# Patient Record
Sex: Male | Born: 1950 | Race: White | Hispanic: No | Marital: Married | Smoking: Former smoker
Health system: Southern US, Community
[De-identification: ages and names within clinical notes are randomized; demographics above are authoritative.]

## PROBLEM LIST (undated history)

## (undated) DIAGNOSIS — G629 Polyneuropathy, unspecified: Secondary | ICD-10-CM

## (undated) DIAGNOSIS — Z992 Dependence on renal dialysis: Secondary | ICD-10-CM

## (undated) DIAGNOSIS — K219 Gastro-esophageal reflux disease without esophagitis: Secondary | ICD-10-CM

## (undated) DIAGNOSIS — E7800 Pure hypercholesterolemia, unspecified: Secondary | ICD-10-CM

## (undated) DIAGNOSIS — I10 Essential (primary) hypertension: Secondary | ICD-10-CM

## (undated) DIAGNOSIS — G8929 Other chronic pain: Secondary | ICD-10-CM

## (undated) DIAGNOSIS — N186 End stage renal disease: Secondary | ICD-10-CM

## (undated) DIAGNOSIS — G4730 Sleep apnea, unspecified: Secondary | ICD-10-CM

## (undated) DIAGNOSIS — R091 Pleurisy: Secondary | ICD-10-CM

## (undated) DIAGNOSIS — M1990 Unspecified osteoarthritis, unspecified site: Secondary | ICD-10-CM

## (undated) DIAGNOSIS — J189 Pneumonia, unspecified organism: Secondary | ICD-10-CM

## (undated) DIAGNOSIS — M25551 Pain in right hip: Secondary | ICD-10-CM

## (undated) DIAGNOSIS — H269 Unspecified cataract: Secondary | ICD-10-CM

## (undated) DIAGNOSIS — D649 Anemia, unspecified: Secondary | ICD-10-CM

## (undated) DIAGNOSIS — M109 Gout, unspecified: Secondary | ICD-10-CM

## (undated) HISTORY — PX: TOTAL KNEE ARTHROPLASTY: SHX125

## (undated) HISTORY — PX: WRIST TENDON TRANSFER: SHX2676

## (undated) HISTORY — PX: COLONOSCOPY: SHX174

## (undated) HISTORY — PX: JOINT REPLACEMENT: SHX530

## (undated) HISTORY — PX: BILATERAL CARPAL TUNNEL RELEASE: SHX6508

## (undated) HISTORY — DX: Unspecified cataract: H26.9

---
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
## 1977-02-16 HISTORY — PX: CHOLECYSTECTOMY OPEN: SUR202

## 1979-02-17 HISTORY — PX: HAMMER TOE SURGERY: SHX385

## 1999-02-17 HISTORY — PX: SHOULDER OPEN ROTATOR CUFF REPAIR: SHX2407

## 2004-02-17 HISTORY — PX: FINGER SURGERY: SHX640

## 2005-06-17 ENCOUNTER — Encounter: Admission: RE | Admit: 2005-06-17 | Discharge: 2005-06-17 | Payer: Self-pay | Admitting: Nephrology

## 2006-03-01 ENCOUNTER — Inpatient Hospital Stay (HOSPITAL_COMMUNITY): Admission: RE | Admit: 2006-03-01 | Discharge: 2006-03-05 | Payer: Self-pay | Admitting: Orthopedic Surgery

## 2006-03-04 ENCOUNTER — Encounter: Payer: Self-pay | Admitting: Vascular Surgery

## 2006-03-16 ENCOUNTER — Encounter (HOSPITAL_COMMUNITY): Admission: RE | Admit: 2006-03-16 | Discharge: 2006-06-14 | Payer: Self-pay | Admitting: Nephrology

## 2006-11-12 ENCOUNTER — Ambulatory Visit (HOSPITAL_COMMUNITY): Admission: RE | Admit: 2006-11-12 | Discharge: 2006-11-12 | Payer: Self-pay | Admitting: Nephrology

## 2008-03-05 ENCOUNTER — Inpatient Hospital Stay (HOSPITAL_COMMUNITY): Admission: RE | Admit: 2008-03-05 | Discharge: 2008-03-08 | Payer: Self-pay | Admitting: Orthopedic Surgery

## 2009-10-11 ENCOUNTER — Encounter (HOSPITAL_COMMUNITY): Admission: RE | Admit: 2009-10-11 | Discharge: 2009-12-17 | Payer: Self-pay | Admitting: Nephrology

## 2010-06-02 LAB — CBC
HCT: 28.5 % — ABNORMAL LOW (ref 39.0–52.0)
HCT: 30.4 % — ABNORMAL LOW (ref 39.0–52.0)
HCT: 34.9 % — ABNORMAL LOW (ref 39.0–52.0)
Hemoglobin: 13.2 g/dL (ref 13.0–17.0)
Hemoglobin: 10.1 g/dL — ABNORMAL LOW (ref 13.0–17.0)
Hemoglobin: 11.4 g/dL — ABNORMAL LOW (ref 13.0–17.0)
Hemoglobin: 9.5 g/dL — ABNORMAL LOW (ref 13.0–17.0)
MCHC: 33.3 g/dL (ref 30.0–36.0)
MCV: 94.7 fL (ref 78.0–100.0)
Platelets: 242 10*3/uL (ref 150–400)
RBC: 4.26 MIL/uL (ref 4.22–5.81)
RBC: 3.02 MIL/uL — ABNORMAL LOW (ref 4.22–5.81)

## 2010-06-02 LAB — PROTIME-INR
INR: 1.1 (ref 0.00–1.49)
INR: 1.2 (ref 0.00–1.49)
INR: 1.3 (ref 0.00–1.49)
Prothrombin Time: 14.2 seconds (ref 11.6–15.2)

## 2010-06-02 LAB — BASIC METABOLIC PANEL
BUN: 50 mg/dL — ABNORMAL HIGH (ref 6–23)
CO2: 25 mEq/L (ref 19–32)
CO2: 25 mEq/L (ref 19–32)
CO2: 26 mEq/L (ref 19–32)
Calcium: 9.4 mg/dL (ref 8.4–10.5)
Calcium: 8.1 mg/dL — ABNORMAL LOW (ref 8.4–10.5)
Chloride: 99 mEq/L (ref 96–112)
Creatinine, Ser: 2.88 mg/dL — ABNORMAL HIGH (ref 0.4–1.5)
GFR calc Af Amer: 27 mL/min — ABNORMAL LOW (ref >60)
GFR calc non Af Amer: 23 mL/min — ABNORMAL LOW (ref >60)
GFR calc non Af Amer: 19 mL/min — ABNORMAL LOW (ref >60)
Glucose, Bld: 108 mg/dL — ABNORMAL HIGH (ref 70–99)
Glucose, Bld: 113 mg/dL — ABNORMAL HIGH (ref 70–99)
Glucose, Bld: 147 mg/dL — ABNORMAL HIGH (ref 70–99)
Potassium: 4.6 mEq/L (ref 3.5–5.1)
Potassium: 4.9 mEq/L (ref 3.5–5.1)
Sodium: 135 mEq/L (ref 135–145)

## 2010-06-02 LAB — URINALYSIS, ROUTINE W REFLEX MICROSCOPIC
Ketones, ur: NEGATIVE mg/dL
Nitrite: NEGATIVE
pH: 6 (ref 5.0–8.0)

## 2010-06-02 LAB — DIFFERENTIAL
Basophils Relative: 1 % (ref 0–1)
Lymphocytes Relative: 25 % (ref 12–46)
Neutro Abs: 3.5 10*3/uL (ref 1.7–7.7)
Neutrophils Relative %: 64 % (ref 43–77)

## 2010-06-02 LAB — TYPE AND SCREEN: Antibody Screen: NEGATIVE

## 2010-06-02 LAB — APTT: aPTT: 30 seconds (ref 24–37)

## 2010-07-01 NOTE — Op Note (Signed)
Richard Bush, Richard Bush                  ACCOUNT NO.:  [id number]   MEDICAL RECORD NO.:  [id number]          PATIENT TYPE:  INP   LOCATION:  5035                         FACILITY:  [facility]   PHYSICIAN:  Kathalene Frames. Mayer Camel, M.D.   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   PROCEDURE:  Left total knee arthroplasty using DePuy Sigma RP  components, a #6 tibial baseplate, #5 left femoral component, 10-mm  Sigma RP spacer, and a 41-mm patellar button.  We used double batch of  DePuy cement, all components were cemented and there was 1500 mg of  Zinacef in the cement.   SURGEON:  Kathalene Frames. Mayer Camel, MD   FIRST ASSISTANT:  Leafy Kindle, [facility]   ANESTHETIC:  General endotracheal.   ESTIMATED BLOOD LOSS:  Minimal.   FLUID REPLACEMENT:  1800 mL of crystalloid.   DRAINS PLACED:  Two medium Hemovacs and Foley catheter.   URINE OUTPUT:  300 mL.   TOURNIQUET TIME:  1 hour and 50 minutes.   INDICATIONS FOR PROCEDURE:  The patient is a 60 year old gentleman who  has previously had a right total knee done for similar reasons back in  January 2008, and has done well.  He desires elective left total knee  arthroplasty.  Risks and benefits of surgery have been discussed,  questions answered.  He has severe unremitting pain and desires elective  left total knee arthroplasty.   DESCRIPTION OF PROCEDURE:  The patient was identified by armband and  received IV antibiotics in the preoperative area at [facility], and  also had a left femoral nerve block done after the appropriate  anesthetic monitors were attached.  He was then taken to the operating  room where the appropriate anesthetic monitors were  attached and general  endotracheal anesthesia was induced with the patient in supine position.  A Foley catheter was inserted.  Tourniquet was applied high to the left  thigh.  Foot positioner and lateral post were applied to the table, and  left lower extremity prepped and draped in the usual sterile fashion  from the ankle to the tourniquet.  The limb was wrapped with an Esmarch  bandage, tourniquet inflated to 350 mmHg, and we began the operation by  making the anterior midline incision about 18 cm in length starting a  handbreadth above the patella going over the midline of the patella and  1 cm medial to and 4 cm distal to the tibial tubercle.  Small bleeders  in the skin and the subcutaneous tissue were identified and cauterized,  and medial parapatellar arthrotomy accomplished.  The patella was  everted.  The prepatellar fat pad was resected.  The superficial medial  collateral ligament was elevated off the proximal medial flare of the  tibia going from anterior to posterior and leaving in  intact distally.  This allowed [place] to hyperflex the knee with the patella everted, exposing  the anterior one half of the menisci which were resected as were the  cruciate ligaments with the electrocautery.  Peripheral and notch  osteophytes were removed with an osteotome with the knee hyperflexed.  We entered the proximal tibia with a DePuy step drill followed by the IM  rod, and a 2-degree posterior slope cutting guide.  This allowed a  fairly generous resection of the tibia, because the patient did have  flexion contracture.  The cutting guide was pinned into place.  The IM  rod removed and the proximal tibia cut accomplished protecting the  posterior structures with a posteromedial Z-retractor, a Engineer, structural through the notch and lateral Hohmann retractor.  We then  entered the distal femur 2-mm anterior at the PCL origin with a step  drill followed by the IM rod and 5-degrees left to  distal femoral  cutting guide, set for a 12-mm distal femoral cut from the high point.  This was pinned in place along the epicondylar axis and the distal  femoral cut accomplished with an sizer #5 femoral component, and set the  guide 3 degrees of external rotation because of the varus deformity.  The chamfer cutting guide was then pinned into place with the screw pins  and the anterior-posterior chamfer cuts were accomplished without  difficulty followed by the DePuy box cut.  The patella itself was then  measured at 27 mm, the cutting guide was set at 16, and the posterior 11  mm of the patella resected size for #41 patellar button and drilled.  We  then directed our attention to the tibia once again and with the knee  hyperflexed, sizer #6 tibial baseplate trial which was pinned into place  followed by the smokestack in the conical reamer and the Delta fin keel  punch.  We then hammered into place a #5 left trial femoral component,  drilled the lugs, snapped in a 10-mm Sigma RP bearing, brought the knee  out to full extension, and then checked tracking as we went to a flexion  of about 135 degrees limited by adipose tissue.  The patellar tracking  was noted to be excellent.  At this point, all the trial components  removed and all bony surfaces were water picked, clean, and dried with  suction and sponges.  A double batch of DePuy HV cement was mixed with  1500 mg of Zinacef and applied to all bony metallic mating surfaces.  In  order, we then hammered into place a #6 tibial baseplate and removed  excess cement.  A 5 left femoral component removed excess cement and a  41-mm patellar button was squeezed into place and excess cement also  removed.  A 10-mm Sigma RP bearing was then snapped onto the 6  baseplate.  The knee reduced, brought to full extension, and then held  in compression as the cement hardened.  Once again all bony surfaces of  the wound was water picked and cleaned.   Medium Hemovac drains were  placed from the anterolateral approach and after the cement cured, we  checked our tracking one more time.  The parapatellar arthrotomy was  closed with running #1 Vicryl suture, the subcutaneous tissue with 0 and  2 undyed Vicryl suture, and the skin with staples.  A dressing of  Xeroform, 4x4 dressing sponges, Webril, and Ace wrap was then applied.  The tourniquet was let down,  and the patient was awakened and taken to  the recovery room without difficulty.       Kathalene Frames. Mayer Camel, M.D.  Electronically Signed     FJR/MEDQ  D:  03/12/2008  T:  03/12/2008  Job:  [id number]

## 2010-07-04 NOTE — Discharge Summary (Signed)
NAMEKRISTOPER, BOSHEARS                  ACCOUNT NO.:  [id number]   MEDICAL RECORD NO.:  [id number]          PATIENT TYPE:  INP   LOCATION:  5035                         FACILITY:  [place]   PHYSICIAN:  Kathalene Frames. Mayer Camel, M.D.   DATE OF BIRTH:  Apr 06, 1950   DATE OF ADMISSION:  03/05/2008  DATE OF DISCHARGE:  03/08/2008                               DISCHARGE SUMMARY   CHIEF COMPLAINT:  Left knee pain.   HISTORY OF PRESENT ILLNESS:  This is a 60 year old gentleman who  complains of unremitting severe pain in his left knee despite  conservative treatment with steroid injection and pain medications.  He  desires a surgical intervention.  All risks and benefits of surgery were  discussed with the patient.   His past medical history is significant for:  1. Chronic kidney disease.  2. Hypertension.  3. Anemia.  4. Gout.   PAST SURGICAL HISTORY:  He had a right total knee arthroplasty in 2008,  a right shoulder surgery in 2001, and bilateral hand surgery in 2006.   SOCIAL HISTORY:  He is a nonsmoker and denies alcohol use.   FAMILY HISTORY:  Noncontributory.   ALLERGIES:  SULFA drugs and SUPARTZ.   CURRENT MEDICATIONS:  1. Omeprazole 20 mg 1 p.o. daily.  2. Cozaar 100 mg 1 p.o. daily.  3. Lasix 80 mg 1 p.o. b.i.d.  4. Simvastatin 20 mg 1 p.o. daily.  5. Lovaza 1 g 2 p.o. b.i.d.  6. Allopurinol 100 mg p.o. daily.  7. Sodium bicarbonate 650 mg 2 tablets p.o. b.i.d.  8. Calcitriol 0.25 mcg on Monday, Wednesday, and Friday.  9. Tylenol 650 mg 1 p.o. daily.  10.Vitamin E 4000 IU p.o. daily.  11.Vitamin C 500 mg 1 p.o. daily.   PHYSICAL EXAMINATION:  EXTREMITIES:  Gross examination of the left knee  demonstrates the patient's range of motion to be 10-100 degrees.  He  walks with an antalgic gait.  He is neurovascularly intact.  X-rays  demonstrate bone-on-bone degenerative joint disease of the left knee.   PREOPERATIVE LABORATORY DATA:  White blood cells 5.6, red blood cells  4.26,  hemoglobin 13.2, hematocrit 40.5, platelets 273.  PT 14.2, INR  1.1, PTT 30.  Sodium 142, potassium 4.6, chloride 108, glucose 108, BUN  48, creatinine 2.88.  GFR 23.   HOSPITAL COURSE:  Mr. Sarff was admitted to [facility] on March 05, 2008, when he underwent a left total knee arthroplasty.  The procedure  was performed by Dr. Frederik Pear, and the patient tolerated it well.  Two medium Hemovac drains were placed into the left knee and a  perioperative Foley catheter was placed.  The patient was transferred to  the orthopedic floor and placed on Lovenox and Coumadin for DVT  prophylaxis.  On the first postoperative day, the patient was awake and  alert.  He reported nausea but no vomiting.  As the Hemovac drains were  removed from the wound, the Foley catheter was removed after physical  therapy.  On the second postoperative day, the patient reported  improvement in his nausea  and was tolerating p.o. intake well.  He had  ambulated 50 feet with physical therapy on previous day.  Hemoglobin was  10.1.  Surgical dressing was clean.  It was changed on that day, and his  incision was found to be benign.  On the third postoperative day, the  patient was awake and alert and tolerating p.o. intake well.  He was  passing flatus and ambulating independently.  He passed all of his  physical therapy goals and was discharged home.   DISPOSITION:  The patient was discharged home on March 08, 2008.  He  was weightbearing as tolerated.  Home health will manage his wound,  Coumadin, and physical therapy, and he will return to the clinic to see  Dr. Mayer Camel in 1 week.   Discharge medicines were as per the HMR with the addition of Coumadin,  take as directed with a target INR of 1.5-2 and Percocet 5 mg 1-2  tablets p.o. q.4 h. p.r.n. pain.   FINAL DIAGNOSIS:  End-stage degenerative joint disease of the left knee.      Leafy Kindle, PA      Kathalene Frames. Mayer Camel, M.D.  Electronically  Signed    JW/MEDQ  D:  04/17/2008  T:  04/18/2008  Job:  [id number]

## 2010-07-04 NOTE — Op Note (Signed)
Richard Bush, Richard Bush                  ACCOUNT NO.:  [id number]   MEDICAL RECORD NO.:  [id number]          PATIENT TYPE:  INP   LOCATION:  2550                         FACILITY:  [place]   PHYSICIAN:  Kathalene Frames. Mayer Camel, M.D.   DATE OF BIRTH:  May 22, 1950   DATE OF PROCEDURE:  03/01/2006  DATE OF DISCHARGE:                               OPERATIVE REPORT   PREOPERATIVE DIAGNOSIS:  End-stage osteoarthritis, right knee with varus  deformity and flexion contracture.   POSTOPERATIVE DIAGNOSIS:  End-stage osteoarthritis, right knee with  varus deformity and flexion contracture.   PROCEDURE:  Cemented right total knee arthroplasty using DePuy Sigma RP  #5 femur, right #6 tibia, 10 mm rotating platform bearing and a 38 mm  patellar button, double batch of DePuy fast set polymethyl methacrylate  cement with 1500 mg Zinacef.   SURGEON:  Kathalene Frames. Mayer Camel, MD   FIRST ASSISTANT:  Nehemiah Massed PA-C.   ANESTHETIC:  Endotracheal.   ESTIMATED BLOOD LOSS:  Minimal.   FLUID REPLACEMENT:  1200 mL crystalloid.   DRAINS PLACED:  Foley catheter.   URINE OUTPUT:  300 mL.   TOURNIQUET TIME:  1 hour 25 minutes.   INDICATIONS FOR PROCEDURE:  60 year old gentleman with end-stage  arthritis of the right knee with varus deformity as well as a flexion  contracture.  He has failed conservative treatment with anti-  inflammatory medicines, observation, activity modifications and is  miserable.  Cortisone shots have not helped.  Synvisc did not work,  either.  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  In any event  because of severe unremitting pain that effects his ability to do  activities of daily living and he desires right total knee arthroplasty.  Risks and benefits of surgery discussed preoperatively and he was  evaluated by Donato Heinz, M.D. of the local [facility] nephrology  group prior to surgery.  Questions answered.   DESCRIPTION OF PROCEDURE:  The patient identified by armband, taken the  operating room at [facility] main hospital where the appropriate site monitors  were attached and general endotracheal anesthesia induced with the  patient in supine position.  Tourniquet was applied high to the right  thigh, lateral post and foot positioner applied to the table and the  right lower extremity prepped, draped in usual sterile fashion from the  ankle to the tourniquet.  The limb was then wrapped with an Esmarch  bandage, tourniquet inflated to 4 mmHg and we began the procedure by  making an anterior midline incision about 20 cm in length centered over  the patella through the skin and subcutaneous tissue down to the level  of the transverse retinaculum over the patella which was then reflected  medially allowing a medial parapatellar arthrotomy.  We immediately  encountered intra-articular loose body that was removed.  The medial  compartment definitely had end-stage arthritic changes with bone rubbing  on bone.  The prepatellar fat pad was resected with the knee in  extension.  Superficial medial collateral ligament was elevated  off the  proximal tibia and reflected back to the posterior medial tibia leaving  it intact distally and peeling off part of the semimembranosus  insertion.  The anterior one half of the menisci were resected.  The  knee was then hyperflexed with the patella everted, exposing the  arthritic joint.  Medial, lateral and notch osteophytes were removed.  The ACL and PCL were then resected with electrocautery.  A posterior  medial Z retractor was inserted.  A McHale retractor through the notch  translating the tibia forward and allowing entry to the center of the  proximal tibia with a step drill followed by intramedullary rod and a 0  degree posterior slope cutting guide.  Because of the 10 degrees flexion  contracture, we elected to resect about 8 mm of bone medially and 10-11  mm of  bone laterally.  Satisfied with this resection, the knee was then  hyperflexed allowing entry to the distal femur 2 mm anterior to the PCL  origin with a 5 degrees right distal femoral cutting guide set at 11 mm.  This was pinned in place along the epicondylar axis and the distal  femoral cut accomplished without difficulty.  The spacer block for a 10-  mm Sigma RP spacer was then inserted with the knee in extension.  There  was a little bit of tightness medially requiring further release of the  superficial medial collateral ligament.  The knee was once again then  flexed.  We measured for a #5 femoral component in the AP plane and  pinned the cutting guide in 3 degrees of external rotation and performed  the anterior-posterior and chamfer cuts without difficulty.  We then  performed the standard Sigma RP box cut and removed more osteophytes.  The patella was then measured to 26 mm.  It was felt to take one of the  larger buttons so we set the cutting guide at 15 mm and resected the  posterior 11 mm of the patella, sized for a 38 button and drilled the  patella.  With the knee hyperflexed, we then measured for #6 tibial  baseplate which was pinned into place.  The smokestack was hammered on  top of the base of the baseplate and then reamed to the appropriate  depth with the conical reamer.  We then inserted the trial keel and  center plug the #5 right trial femoral component was hammered into place  and a 10-mm trial spacer was placed on the baseplate and the knee  reduced.  He came to full extension with a slight amount of  hyperextension which was good because of his flexion contracture and the  collateral ligaments were stable.  The patella tracked without any thumb  pressure.  At this point the trial components were removed.  All bony  surfaces were water picked clean and dried with suction and sponges.  At the back table a double batch of DePuy fast set polymethyl methacrylate  was  mixed with 1500 mg of Zinacef and applied to all bony and metallic  mating surfaces except for the posterior condyles of the femur itself.  After the bone had been water picked clean and dried with suction and  sponges, we hammered into place the #6 tibial baseplate, the #5 right  femoral component and removed excess cement from both.  We inserted the  10-mm Sigma RP rotating bearing and then on the patellar side we  squeezed into place a 38 mm patellar button and again  removed excess  cement.  Medium Hemovac drains were placed deep in the wound which was  once again water picked clean and after the cement cured, the knee was  once again taken through range of motion documenting good tracking of  the patella.  The parapatellar arthrotomy was then closed with running  #1 Vicryl suture.  The subcutaneous tissue with 0-0 and 2-0 undyed  Vicryl suture and the skin with skin staples.  A dressing of Xeroform,  4x4 dressing sponges, Webril and Ace wrap was then applied.  The  tourniquet was let down.  The patient was awakened and taken to the  recovery room without difficulty.      Kathalene Frames. Mayer Camel, M.D.  Electronically Signed     FJR/MEDQ  D:  03/01/2006  T:  03/01/2006  Job:  [id number]

## 2010-07-04 NOTE — Discharge Summary (Signed)
Richard Bush, Richard Bush                  ACCOUNT NO.:  [id number]   MEDICAL RECORD NO.:  [id number]          PATIENT TYPE:  INP   LOCATION:  5011                         FACILITY:  Home   PHYSICIAN:  Kathalene Frames. Mayer Camel, M.D.   DATE OF BIRTH:  1950-03-30   DATE OF ADMISSION:  03/01/2006  DATE OF DISCHARGE:  03/05/2006                               DISCHARGE SUMMARY   PRIMARY DIAGNOSIS FOR THIS ADMISSION:  End-stage arthritis of the right  knee.   [place]:  Right total knee arthroplasty.   DISCHARGE SUMMARY:  Patient is a 60 year old man who has end-stage  arthritis of the right knee with a varus deformity, as well as flexion  contracture, he has failed conservative treatment, antiinflammatory  medication, stretching and activity modifications and has continued to  have pain and effects of activities of daily of living.  Cortisone shots  have not been helpful.  Synvisc did not help.  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  Because of severe unremitting pain, it effects his  abilities to do ADLs, he desires to undergo a right total knee  arthroplasty.  Risks and benefits were discussed and he was  preoperatively evaluated by Donato Heinz in the local [facility]  Nephrology group prior to surgery.   ALLERGIES:  1. SULFA.  2. SYNVISC.   MEDICATIONS AT TIME OF ADMISSION:  Altace, Cozaar, simvastatin, Pepcid,  Darvocet, oxycodone, fish oil, sodium bicarb, allopurinol and Lasix.   PAST MEDICAL HISTORY:  Significant for:  1. Usual childhood diseases.  2. IJA nephritis.  3. Hypertension.   PAST SURGICAL HISTORY:  1. Right carpal tunnel release.  2. Left carpal tunnel release.  3. Right shoulder rotator cuff repair.   SOCIAL HISTORY:  No tobacco.  No ethanol.  No IV drug abuse.  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   FAMILY HISTORY:  Mother is alive at 3 with history of DJD, breast  cancer and pacemaker.  Father alive at age 49 with a history of  hypertension and diabetes.   REVIEW OF SYSTEMS:  Positive for glasses.  Patient denies any shortness  of breath, chest pain or recent illness.   PHYSICAL EXAMINATION:  VITAL SIGNS:  Temperature 98.7.  Pulse 69.  Blood  pressure 128/84.  He is a 5 feet 11 inches, 275 pound male.  Head is normocephalic, atraumatic.  EARS:  TMs clear.  EYES:  Pupils equal and round to light and accommodation.  __________.  NECK:  Supple.  Full range of motion.  Clear to auscultation.  CHEST:  Clear to auscultation and percussion.  HEART:  Regular rate and rhythm.  ABDOMEN:  Soft, nontender.  EXTREMITIES:  Right knee range of motion 5-100 degrees and positive  effusion crepitus.  Stable ligamentous with a 5 degree varus deformity.   X-rays showed bone on bone changes immediately.   PREOPERATIVE LABS:  Including CBC, CMET, chest x-ray, EKG, PT and PTT  were within normal limits with the exception of  the EKG, which did show  right bundle branch block, which was found on previous EKG.  Hemoglobin  of 12.3.  Potassium of 5.6, glucose of 102, BUN of 49, creatinine 3.17,  GFR 20.   HOSPITAL COURSE:  On the day of admission, the patient was taken to the  operating room at [facility] where he underwent a right total knee  arthroplasty using diffuse single number 5 femur, number 6 tibia, 10 mm  rotating platform bearing a 38 mm patellar button with __________ methyl  methacrylate cement with 1500 mg Zinacef.  Foley catheter was placed  preoperatively.  Patient was placed on preoperative antibiotics.  He was  placed on postoperative Coumadin prophylaxis with bridging Lovenox  therapy per pharmacy protocol.  He was placed on a PCA morphine pump for  pain control and physical therapy was begun while in the recovery room  using a CPM.  Postoperative day 1, patient was seen immediately  postoperatively by the physicians and PAs from the renal  service, who  helped to manage his medical care including fluid management.  Postoperatively, the patient was awake and alert, had some nausea and  vomiting but that was resolving.  He has some moderate pain controlled  well with PCA.  Vital signs were stable.  He is afebrile.  Hemoglobin  10.4.  Drain output 375 mL.  Potassium 5.9, otherwise, stable.  INR 1.2.  Once again IV fluids and potassium levels were managed per nephrology.  Altace and Cozaar were resumed on the second day postoperatively.   Postoperative day 2, patient was complaining of moderate pain, making  progress with physical therapy.  T-Max 101.1 range and 98.5.  PT is  17.8, INR of 1.4.  Hemoglobin 8.7.  Dressing was changed.  Hemovac was  discontinued and wounds were benign.  He continued with physical  therapy.   Postop day 3, the patient was complaining pain in the CPM; otherwise,  doing well.  He was afebrile.  Vital signs were stable.  He was alert  and oriented x3.  Hemoglobin 8.6, WBC 7.9.  INR 1.6.  Wound was clean  and dry.  Calf had increased edema and ecchymosis.  Venous Dopplers were  obtained to rule out DVT and these were negative for DVT.  It did show  what might have been a slight bleed into the calf.  Postoperative day 4,  the patient was without complaint and he was out of bed in the hall.  He  is afebrile.  Vital signs are stable and he was alert and oriented.  Dressings were dry and wound was benign.  Once again the Dopplers have  been negative.  His calf had decreased edema and he discharged home in  the care of his family once cleared by the renal service.  Prescriptions  were given for Coumadin, Tylox and Robaxin.  He will resume home meds as  per renal service.   RECOMMENDATIONS:  1. Diet is regular.  2. Activity:  Weight bearing as tolerated with a walker.  3. Return to clinic in 1 week with Dr. Mayer Camel.  4. May shower postoperative day 5.  5. Return as per renal suggestion for their  followup.      Lowell Guitar. Mercie Eon.      Kathalene Frames. Mayer Camel, M.D.  Electronically Signed    JBR/MEDQ  D:  04/12/2006  T:  04/12/2006  Job:  [id number]

## 2012-06-30 ENCOUNTER — Other Ambulatory Visit: Payer: Self-pay | Admitting: Nephrology

## 2012-06-30 DIAGNOSIS — M5416 Radiculopathy, lumbar region: Secondary | ICD-10-CM

## 2012-07-05 ENCOUNTER — Other Ambulatory Visit: Payer: Self-pay | Admitting: Nephrology

## 2012-07-05 DIAGNOSIS — Z139 Encounter for screening, unspecified: Secondary | ICD-10-CM

## 2012-07-07 ENCOUNTER — Ambulatory Visit
Admission: RE | Admit: 2012-07-07 | Discharge: 2012-07-07 | Disposition: A | Discharge status: Home / Self Care | Payer: Medicare Other | Source: Ambulatory Visit | Attending: Nephrology | Admitting: Nephrology

## 2012-07-07 DIAGNOSIS — Z139 Encounter for screening, unspecified: Secondary | ICD-10-CM

## 2012-07-07 DIAGNOSIS — M5416 Radiculopathy, lumbar region: Secondary | ICD-10-CM

## 2012-08-02 ENCOUNTER — Other Ambulatory Visit: Payer: Self-pay | Admitting: Neurosurgery

## 2012-08-08 ENCOUNTER — Encounter (HOSPITAL_COMMUNITY): Payer: Self-pay | Admitting: Pharmacy Technician

## 2012-08-11 NOTE — Pre-Procedure Instructions (Signed)
Richard Bush  08/11/2012   Your procedure is scheduled on:  Thursday, July 3rd   Report to [facility] at  7:00 AM.             Arrival time is per your surgeon's request.   Call this number if you have problems the morning of surgery: [phone]   Remember:   Do not eat food or drink liquids after midnight Wednesday.    Take these medicines the morning of surgery with A SIP OF WATER: Uloric, Gabapentin, Hydrocodone, Omeprazole   Do not wear jewelry.  Do not wear lotions, powders, or colognes. You may NOT wear deodorant.   Men may shave face and neck.   Do not bring valuables to the hospital.  [facility] is not responsible for any belongings or valuables.  Contacts, dentures or bridgework may not be worn into surgery.   Leave suitcase in the car. After surgery it may be brought to your room.  For patients admitted to the hospital, checkout time is 11:00 AM the day of discharge.   Name and phone number of your driver:    Special Instructions: Shower using CHG 2 nights before surgery and the night before surgery.  If you shower the day of surgery use CHG.  Use special wash - you have one bottle of CHG for all showers.  You should use approximately 1/3 of the bottle for each shower.   Please read over the following fact sheets that you were given: Pain Booklet, Coughing and Deep Breathing, MRSA Information and Surgical Site Infection Prevention

## 2012-08-12 ENCOUNTER — Encounter (HOSPITAL_COMMUNITY)
Admission: RE | Admit: 2012-08-12 | Discharge: 2012-08-12 | Disposition: A | Discharge status: Home / Self Care | Payer: Medicare Other | Source: Ambulatory Visit | Attending: Neurosurgery | Admitting: Neurosurgery

## 2012-08-12 ENCOUNTER — Encounter (HOSPITAL_COMMUNITY): Payer: Self-pay

## 2012-08-12 HISTORY — DX: Gastro-esophageal reflux disease without esophagitis: K21.9

## 2012-08-12 HISTORY — DX: Anemia, unspecified: D64.9

## 2012-08-12 HISTORY — DX: Gout, unspecified: M10.9

## 2012-08-12 HISTORY — DX: Essential (primary) hypertension: I10

## 2012-08-12 LAB — CBC
HCT: 39.7 % (ref 39.0–52.0)
Hemoglobin: 13.7 g/dL (ref 13.0–17.0)
MCH: 32.2 pg (ref 26.0–34.0)
MCHC: 34.5 g/dL (ref 30.0–36.0)
MCV: 93.4 fL (ref 78.0–100.0)
Platelets: 184 10*3/uL (ref 150–400)
RBC: 4.25 MIL/uL (ref 4.22–5.81)
RDW: 13.1 % (ref 11.5–15.5)
WBC: 6.1 10*3/uL (ref 4.0–10.5)

## 2012-08-12 LAB — SURGICAL PCR SCREEN
MRSA, PCR: NEGATIVE
Staphylococcus aureus: NEGATIVE

## 2012-08-12 LAB — BASIC METABOLIC PANEL
BUN: 56 mg/dL — ABNORMAL HIGH (ref 6–23)
CO2: 27 mEq/L (ref 19–32)
Calcium: 9.2 mg/dL (ref 8.4–10.5)
Chloride: 100 mEq/L (ref 96–112)
Creatinine, Ser: 3.47 mg/dL — ABNORMAL HIGH (ref 0.50–1.35)
GFR calc Af Amer: 20 mL/min — ABNORMAL LOW (ref >90)
GFR calc non Af Amer: 17 mL/min — ABNORMAL LOW (ref >90)
Glucose, Bld: 90 mg/dL (ref 70–99)
Potassium: 4.3 mEq/L (ref 3.5–5.1)
Sodium: 136 mEq/L (ref 135–145)

## 2012-08-12 NOTE — Progress Notes (Signed)
This is a 62 year old male who is scheduled for multilevel lumbar spine surgery to be performed by Dr. Jovita Gamma on Thursday 18 August 2012.   A chart review was requested due to an abnormal EKG dated 12 August 2012. There are no prior EKGs in EPIC for comparison.   This patient specifically denies any known past medical history of CAD, MI, CHF, cardiac dysrhythmias, or DM. He does not have a cardiologist. He specifically denies any prior cardiac work-up/evaluation. He specifically denies any current cardiac related symptoms, issues or complaints.  His past medical history is significant for: HTN, HLD, CKD, anemia gout.   His PCP is Dr. Darlina Sicilian in [place], [place], [phone].  His nephrologist is Dr. Donato Heinz in [place], [place], [phone].  Given his abnormal EKG, he may require a cardiology evaluation/clearance.  Given his abnormal lab results BUN/CR 56/3.47, he should have clearance from his nephrologist.  CXR dated 12 August 2012 showed no acute cardiopulmonary abnormality seen.   Norva Riffle. Kylan Veach, PA-C

## 2012-08-12 NOTE — Progress Notes (Signed)
Your pt. Has screened at an elevated risk for obstructive sleep apnea using the stop-bang tool during a PAT appt for surgery. A score of 4 or greater is an elevated risk see screening tool for results.

## 2012-08-15 NOTE — Progress Notes (Addendum)
Anesthesia follow-up:  Please refer to initial anesthesia note by Solon Palm, PA-C from 08/12/12.  Since then I was able to obtain previous EKG tracings dating back to 2008 (see Muse).  His heart rate is slower when compared to 02/29/08, but otherwise was felt unchanged.  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  With a stable EKG X 6 years, no known CAD/MI/CHF history, and in the absence of cardiac symptoms I do not think that he will require cardiac clearance.  [name], PA-C had already requested that Dr. Sherwood Gambler request nephrology clearance. [name] at Dr. Donnella Bi office is aware.    This afternoon, I did receive prior Nephrology records.  He previously saw Dr. [name] at [facility] in [place], but not since 2007.  Currently, he is being followed by Dr. [name] at [facility], last visit on 06/30/12.  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  His BUN/Cr on 06/20/12 were 50/3.05, slightly lower than 56/3.47 on 08/12/12.  (Cr was ~ 3.3 in 02/2008.)  Overall, his renal function appears stable.  Will await for any additional renal input; however faxed notes received today show that patient did in fact alert Dr. [name] of his plans for "back surgery" on 08/18/12, and [name] instructed him to hold his Cozaar 48 hours before procedure and to follow-up with his office in the post operative period regarding when to resume.    Richard Bush [facility]/Anesthesiology Phone [phone] 08/15/2012 5:30 PM  Addendum: 08/17/12 11:25 AM I spoke with [name] today.  Dr. [name] is out of the office until tomorrow afternoon.  Overall his EKG and renal function are stable. As above, Dr. [name] is aware of upcoming surgery.  If no acute changes then I would anticipate that he could proceed as planned.  Anesthesiologist Dr. Chriss Driver agrees with plan.  Patient will be evaluated by his assigned  anesthesiologist on the day of surgery.

## 2012-08-17 MED ORDER — CEFAZOLIN SODIUM-DEXTROSE 2-3 GM-% IV SOLR
2.0000 g | INTRAVENOUS | Status: AC
  Administered 2012-08-18: 3 g via INTRAVENOUS
  Filled 2012-08-17: qty 50

## 2012-08-18 ENCOUNTER — Observation Stay (HOSPITAL_COMMUNITY)
Admission: RE | Admit: 2012-08-18 | Discharge: 2012-08-20 | Disposition: A | Discharge status: Home / Self Care | Payer: Medicare Other | Source: Ambulatory Visit | Attending: Neurosurgery | Admitting: Neurosurgery

## 2012-08-18 ENCOUNTER — Encounter (HOSPITAL_COMMUNITY): Payer: Self-pay | Admitting: *Deleted

## 2012-08-18 ENCOUNTER — Inpatient Hospital Stay (HOSPITAL_COMMUNITY): Payer: Medicare Other | Admitting: Anesthesiology

## 2012-08-18 ENCOUNTER — Inpatient Hospital Stay (HOSPITAL_COMMUNITY): Payer: Medicare Other

## 2012-08-18 ENCOUNTER — Encounter (HOSPITAL_COMMUNITY)
Admission: RE | Disposition: A | Discharge status: Home / Self Care | Payer: Self-pay | Source: Ambulatory Visit | Attending: Neurosurgery

## 2012-08-18 ENCOUNTER — Encounter (HOSPITAL_COMMUNITY): Payer: Self-pay | Admitting: Anesthesiology

## 2012-08-18 DIAGNOSIS — M51379 Other intervertebral disc degeneration, lumbosacral region without mention of lumbar back pain or lower extremity pain: Secondary | ICD-10-CM | POA: Insufficient documentation

## 2012-08-18 DIAGNOSIS — IMO0002 Reserved for concepts with insufficient information to code with codable children: Secondary | ICD-10-CM | POA: Insufficient documentation

## 2012-08-18 DIAGNOSIS — R112 Nausea with vomiting, unspecified: Secondary | ICD-10-CM | POA: Insufficient documentation

## 2012-08-18 DIAGNOSIS — Z01812 Encounter for preprocedural laboratory examination: Secondary | ICD-10-CM | POA: Insufficient documentation

## 2012-08-18 DIAGNOSIS — Y834 Other reconstructive surgery as the cause of abnormal reaction of the patient, or of later complication, without mention of misadventure at the time of the procedure: Secondary | ICD-10-CM | POA: Insufficient documentation

## 2012-08-18 DIAGNOSIS — M47817 Spondylosis without myelopathy or radiculopathy, lumbosacral region: Principal | ICD-10-CM | POA: Insufficient documentation

## 2012-08-18 DIAGNOSIS — N189 Chronic kidney disease, unspecified: Secondary | ICD-10-CM | POA: Insufficient documentation

## 2012-08-18 DIAGNOSIS — Z01818 Encounter for other preprocedural examination: Secondary | ICD-10-CM | POA: Insufficient documentation

## 2012-08-18 DIAGNOSIS — Z79899 Other long term (current) drug therapy: Secondary | ICD-10-CM | POA: Insufficient documentation

## 2012-08-18 DIAGNOSIS — Z6841 Body Mass Index (BMI) 40.0 and over, adult: Secondary | ICD-10-CM | POA: Insufficient documentation

## 2012-08-18 DIAGNOSIS — R339 Retention of urine, unspecified: Secondary | ICD-10-CM | POA: Insufficient documentation

## 2012-08-18 DIAGNOSIS — N9989 Other postprocedural complications and disorders of genitourinary system: Secondary | ICD-10-CM | POA: Insufficient documentation

## 2012-08-18 DIAGNOSIS — R9431 Abnormal electrocardiogram [ECG] [EKG]: Secondary | ICD-10-CM | POA: Insufficient documentation

## 2012-08-18 DIAGNOSIS — M5137 Other intervertebral disc degeneration, lumbosacral region: Secondary | ICD-10-CM | POA: Insufficient documentation

## 2012-08-18 DIAGNOSIS — M48062 Spinal stenosis, lumbar region with neurogenic claudication: Secondary | ICD-10-CM | POA: Insufficient documentation

## 2012-08-18 DIAGNOSIS — Z01810 Encounter for preprocedural cardiovascular examination: Secondary | ICD-10-CM | POA: Insufficient documentation

## 2012-08-18 DIAGNOSIS — I129 Hypertensive chronic kidney disease with stage 1 through stage 4 chronic kidney disease, or unspecified chronic kidney disease: Secondary | ICD-10-CM | POA: Insufficient documentation

## 2012-08-18 HISTORY — PX: LUMBAR LAMINECTOMY/DECOMPRESSION MICRODISCECTOMY: SHX5026

## 2012-08-18 SURGERY — LUMBAR LAMINECTOMY/DECOMPRESSION MICRODISCECTOMY 3 LEVELS
Anesthesia: General | Wound class: Clean

## 2012-08-18 MED ORDER — SODIUM CHLORIDE 0.9 % IV SOLN
INTRAVENOUS | Status: AC
  Filled 2012-08-18: qty 500

## 2012-08-18 MED ORDER — MIDAZOLAM HCL 2 MG/2ML IJ SOLN: 0.5000 mg | Freq: Once | INTRAMUSCULAR | Status: DC | PRN

## 2012-08-18 MED ORDER — PROMETHAZINE HCL 25 MG/ML IJ SOLN: 6.2500 mg | INTRAMUSCULAR | Status: DC | PRN

## 2012-08-18 MED ORDER — HEMOSTATIC AGENTS (NO CHARGE) OPTIME
TOPICAL | Status: DC | PRN
  Administered 2012-08-18 (×2): 1 via TOPICAL

## 2012-08-18 MED ORDER — PHENOL 1.4 % MT LIQD: 1.0000 | OROMUCOSAL | Status: DC | PRN

## 2012-08-18 MED ORDER — CALCITRIOL 0.25 MCG PO CAPS
0.2500 ug | ORAL_CAPSULE | Freq: Every morning | ORAL | Status: DC
  Administered 2012-08-18 – 2012-08-20 (×3): 0.25 ug via ORAL
  Filled 2012-08-18 (×3): qty 1

## 2012-08-18 MED ORDER — VITAMIN C 500 MG PO TABS
500.0000 mg | ORAL_TABLET | Freq: Every morning | ORAL | Status: DC
  Administered 2012-08-18 – 2012-08-20 (×3): 500 mg via ORAL
  Filled 2012-08-18 (×3): qty 1

## 2012-08-18 MED ORDER — OXYCODONE HCL 5 MG PO TABS
5.0000 mg | ORAL_TABLET | Freq: Once | ORAL | Status: AC | PRN
  Administered 2012-08-18: 5 mg via ORAL

## 2012-08-18 MED ORDER — 0.9 % SODIUM CHLORIDE (POUR BTL) OPTIME
TOPICAL | Status: DC | PRN
  Administered 2012-08-18: 1000 mL

## 2012-08-18 MED ORDER — OXYCODONE HCL 5 MG PO TABS
ORAL_TABLET | ORAL | Status: AC
  Filled 2012-08-18: qty 1

## 2012-08-18 MED ORDER — GABAPENTIN 100 MG PO CAPS
100.0000 mg | ORAL_CAPSULE | Freq: Two times a day (BID) | ORAL | Status: DC
  Administered 2012-08-18 – 2012-08-20 (×4): 100 mg via ORAL
  Filled 2012-08-18 (×5): qty 1

## 2012-08-18 MED ORDER — ONDANSETRON HCL 4 MG/2ML IJ SOLN: 4.0000 mg | Freq: Four times a day (QID) | INTRAMUSCULAR | Status: DC | PRN

## 2012-08-18 MED ORDER — ACETAMINOPHEN 10 MG/ML IV SOLN
INTRAVENOUS | Status: AC
  Filled 2012-08-18: qty 100

## 2012-08-18 MED ORDER — HYDROMORPHONE HCL PF 1 MG/ML IJ SOLN
0.2500 mg | INTRAMUSCULAR | Status: DC | PRN
  Administered 2012-08-18 (×2): 0.5 mg via INTRAVENOUS

## 2012-08-18 MED ORDER — OXYCODONE HCL 5 MG/5ML PO SOLN: 5.0000 mg | Freq: Once | ORAL | Status: AC | PRN

## 2012-08-18 MED ORDER — LIDOCAINE-EPINEPHRINE 1 %-1:100000 IJ SOLN
INTRAMUSCULAR | Status: DC | PRN
  Administered 2012-08-18: 15 mL via INTRADERMAL

## 2012-08-18 MED ORDER — SODIUM CHLORIDE 0.9 % IJ SOLN
3.0000 mL | Freq: Two times a day (BID) | INTRAMUSCULAR | Status: DC
  Administered 2012-08-19 – 2012-08-20 (×3): 3 mL via INTRAVENOUS

## 2012-08-18 MED ORDER — HYDROXYZINE HCL 25 MG PO TABS: 50.0000 mg | ORAL_TABLET | ORAL | Status: DC | PRN

## 2012-08-18 MED ORDER — MENTHOL 3 MG MT LOZG: 1.0000 | LOZENGE | OROMUCOSAL | Status: DC | PRN

## 2012-08-18 MED ORDER — HYDROMORPHONE HCL PF 1 MG/ML IJ SOLN
INTRAMUSCULAR | Status: AC
  Filled 2012-08-18: qty 1

## 2012-08-18 MED ORDER — VECURONIUM BROMIDE 10 MG IV SOLR
INTRAVENOUS | Status: DC | PRN
  Administered 2012-08-18: 5 mg via INTRAVENOUS
  Administered 2012-08-18: 2 mg via INTRAVENOUS
  Administered 2012-08-18 (×2): 1 mg via INTRAVENOUS

## 2012-08-18 MED ORDER — BACITRACIN 50000 UNITS IM SOLR
INTRAMUSCULAR | Status: AC
  Filled 2012-08-18: qty 1

## 2012-08-18 MED ORDER — FENTANYL CITRATE 0.05 MG/ML IJ SOLN
INTRAMUSCULAR | Status: DC | PRN
  Administered 2012-08-18 (×2): 50 ug via INTRAVENOUS
  Administered 2012-08-18: 150 ug via INTRAVENOUS
  Administered 2012-08-18 (×2): 50 ug via INTRAVENOUS
  Administered 2012-08-18: 100 ug via INTRAVENOUS
  Administered 2012-08-18: 50 ug via INTRAVENOUS

## 2012-08-18 MED ORDER — SODIUM CHLORIDE 0.9 % IV SOLN
INTRAVENOUS | Status: AC
Start: 2012-08-18 — End: 2012-08-18
  Filled 2012-08-18: qty 500

## 2012-08-18 MED ORDER — LIDOCAINE HCL (CARDIAC) 20 MG/ML IV SOLN
INTRAVENOUS | Status: DC | PRN
  Administered 2012-08-18: 30 mg via INTRAVENOUS

## 2012-08-18 MED ORDER — CYCLOBENZAPRINE HCL 10 MG PO TABS: 10.0000 mg | ORAL_TABLET | Freq: Three times a day (TID) | ORAL | Status: DC | PRN

## 2012-08-18 MED ORDER — PROPOFOL 10 MG/ML IV BOLUS
INTRAVENOUS | Status: DC | PRN
  Administered 2012-08-18: 150 mg via INTRAVENOUS

## 2012-08-18 MED ORDER — CEFAZOLIN SODIUM 1-5 GM-% IV SOLN
INTRAVENOUS | Status: AC
  Filled 2012-08-18: qty 50

## 2012-08-18 MED ORDER — MIDAZOLAM HCL 5 MG/5ML IJ SOLN
INTRAMUSCULAR | Status: DC | PRN
  Administered 2012-08-18: 2 mg via INTRAVENOUS

## 2012-08-18 MED ORDER — ROCURONIUM BROMIDE 100 MG/10ML IV SOLN
INTRAVENOUS | Status: DC | PRN
  Administered 2012-08-18: 50 mg via INTRAVENOUS

## 2012-08-18 MED ORDER — ACETAMINOPHEN 650 MG RE SUPP: 650.0000 mg | RECTAL | Status: DC | PRN

## 2012-08-18 MED ORDER — ALUM & MAG HYDROXIDE-SIMETH 200-200-20 MG/5ML PO SUSP: 30.0000 mL | Freq: Four times a day (QID) | ORAL | Status: DC | PRN

## 2012-08-18 MED ORDER — SUCCINYLCHOLINE CHLORIDE 20 MG/ML IJ SOLN
INTRAMUSCULAR | Status: DC | PRN
  Administered 2012-08-18: 100 mg via INTRAVENOUS

## 2012-08-18 MED ORDER — FEBUXOSTAT 40 MG PO TABS
40.0000 mg | ORAL_TABLET | Freq: Every morning | ORAL | Status: DC
  Administered 2012-08-18 – 2012-08-20 (×3): 40 mg via ORAL
  Filled 2012-08-18 (×3): qty 1

## 2012-08-18 MED ORDER — KCL IN DEXTROSE-NACL 20-5-0.45 MEQ/L-%-% IV SOLN
INTRAVENOUS | Status: DC
  Filled 2012-08-18 (×6): qty 1000

## 2012-08-18 MED ORDER — LACTATED RINGERS IV SOLN: INTRAVENOUS | Status: DC | PRN

## 2012-08-18 MED ORDER — PANTOPRAZOLE SODIUM 40 MG PO TBEC
40.0000 mg | DELAYED_RELEASE_TABLET | Freq: Every day | ORAL | Status: DC
  Administered 2012-08-19 – 2012-08-20 (×2): 40 mg via ORAL
  Filled 2012-08-18 (×2): qty 1

## 2012-08-18 MED ORDER — MEPERIDINE HCL 25 MG/ML IJ SOLN: 6.2500 mg | INTRAMUSCULAR | Status: DC | PRN

## 2012-08-18 MED ORDER — PHENYLEPHRINE HCL 10 MG/ML IJ SOLN
INTRAMUSCULAR | Status: DC | PRN
  Administered 2012-08-18 (×2): 80 ug via INTRAVENOUS

## 2012-08-18 MED ORDER — OXYCODONE HCL 5 MG PO TABS
5.0000 mg | ORAL_TABLET | ORAL | Status: DC | PRN
  Administered 2012-08-18: 5 mg via ORAL
  Administered 2012-08-19: 10 mg via ORAL
  Administered 2012-08-19: 5 mg via ORAL
  Administered 2012-08-19 – 2012-08-20 (×3): 10 mg via ORAL
  Filled 2012-08-18 (×2): qty 2
  Filled 2012-08-18: qty 1
  Filled 2012-08-18 (×2): qty 2
  Filled 2012-08-18: qty 1

## 2012-08-18 MED ORDER — SODIUM BICARBONATE 650 MG PO TABS
1300.0000 mg | ORAL_TABLET | Freq: Two times a day (BID) | ORAL | Status: DC
  Administered 2012-08-18 – 2012-08-20 (×4): 1300 mg via ORAL
  Filled 2012-08-18 (×5): qty 2

## 2012-08-18 MED ORDER — EPHEDRINE SULFATE 50 MG/ML IJ SOLN
INTRAMUSCULAR | Status: DC | PRN
  Administered 2012-08-18 (×2): 10 mg via INTRAVENOUS
  Administered 2012-08-18: 5 mg via INTRAVENOUS

## 2012-08-18 MED ORDER — ONDANSETRON HCL 4 MG/2ML IJ SOLN
INTRAMUSCULAR | Status: DC | PRN
  Administered 2012-08-18: 4 mg via INTRAVENOUS

## 2012-08-18 MED ORDER — MAGNESIUM HYDROXIDE 400 MG/5ML PO SUSP: 30.0000 mL | Freq: Every day | ORAL | Status: DC | PRN

## 2012-08-18 MED ORDER — SODIUM CHLORIDE 0.9 % IJ SOLN: 3.0000 mL | INTRAMUSCULAR | Status: DC | PRN

## 2012-08-18 MED ORDER — ACETAMINOPHEN 325 MG PO TABS: 650.0000 mg | ORAL_TABLET | ORAL | Status: DC | PRN

## 2012-08-18 MED ORDER — NEOSTIGMINE METHYLSULFATE 1 MG/ML IJ SOLN
INTRAMUSCULAR | Status: DC | PRN
  Administered 2012-08-18: 5 mg via INTRAVENOUS

## 2012-08-18 MED ORDER — THROMBIN 5000 UNITS EX SOLR
OROMUCOSAL | Status: DC | PRN
  Administered 2012-08-18: 11:00:00 via TOPICAL

## 2012-08-18 MED ORDER — ACETAMINOPHEN 10 MG/ML IV SOLN
1000.0000 mg | Freq: Four times a day (QID) | INTRAVENOUS | Status: AC
  Administered 2012-08-18 – 2012-08-19 (×4): 1000 mg via INTRAVENOUS
  Filled 2012-08-18 (×4): qty 100

## 2012-08-18 MED ORDER — FUROSEMIDE 80 MG PO TABS
80.0000 mg | ORAL_TABLET | Freq: Two times a day (BID) | ORAL | Status: DC
  Administered 2012-08-19 – 2012-08-20 (×3): 80 mg via ORAL
  Filled 2012-08-18 (×6): qty 1

## 2012-08-18 MED ORDER — SODIUM CHLORIDE 0.9 % IV SOLN
INTRAVENOUS | Status: DC | PRN
  Administered 2012-08-18: 10:00:00 via INTRAVENOUS

## 2012-08-18 MED ORDER — MORPHINE SULFATE 4 MG/ML IJ SOLN: 4.0000 mg | INTRAMUSCULAR | Status: DC | PRN

## 2012-08-18 MED ORDER — GLYCOPYRROLATE 0.2 MG/ML IJ SOLN
INTRAMUSCULAR | Status: DC | PRN
  Administered 2012-08-18: .8 mg via INTRAVENOUS

## 2012-08-18 MED ORDER — SODIUM CHLORIDE 0.9 % IV SOLN
10.0000 mg | INTRAVENOUS | Status: DC | PRN
  Administered 2012-08-18: 10 ug/min via INTRAVENOUS

## 2012-08-18 MED ORDER — BISACODYL 10 MG RE SUPP: 10.0000 mg | Freq: Every day | RECTAL | Status: DC | PRN

## 2012-08-18 MED ORDER — THROMBIN 5000 UNITS EX SOLR
CUTANEOUS | Status: DC | PRN
  Administered 2012-08-18 (×4): 5000 [IU] via TOPICAL

## 2012-08-18 MED ORDER — BUPIVACAINE HCL (PF) 0.5 % IJ SOLN
INTRAMUSCULAR | Status: DC | PRN
  Administered 2012-08-18: 15 mL

## 2012-08-18 MED ORDER — SODIUM CHLORIDE 0.9 % IR SOLN
Status: DC | PRN
  Administered 2012-08-18: 11:00:00

## 2012-08-18 MED ORDER — ZOLPIDEM TARTRATE 5 MG PO TABS: 5.0000 mg | ORAL_TABLET | Freq: Every evening | ORAL | Status: DC | PRN

## 2012-08-18 SURGICAL SUPPLY — 68 items
ADH SKN CLS APL DERMABOND .7 (GAUZE/BANDAGES/DRESSINGS) ×2
APL SKNCLS STERI-STRIP NONHPOA (GAUZE/BANDAGES/DRESSINGS)
APL SRG 60D 8 XTD TIP BNDBL (TIP) ×1
BAG DECANTER FOR FLEXI CONT (MISCELLANEOUS) ×3 IMPLANT
BENZOIN TINCTURE PRP APPL 2/3 (GAUZE/BANDAGES/DRESSINGS) IMPLANT
BLADE SURG ROTATE 9660 (MISCELLANEOUS) IMPLANT
BRUSH SCRUB EZ PLAIN DRY (MISCELLANEOUS) ×2 IMPLANT
BUR ACORN 6.0 ACORN (BURR) IMPLANT
BUR ACRON 5.0MM COATED (BURR) ×2 IMPLANT
BUR MATCHSTICK NEURO 3.0 LAGG (BURR) ×2 IMPLANT
CANISTER SUCTION 2500CC (MISCELLANEOUS) ×2 IMPLANT
CLOTH BEACON ORANGE TIMEOUT ST (SAFETY) ×2 IMPLANT
CONT SPEC 4OZ CLIKSEAL STRL BL (MISCELLANEOUS) IMPLANT
DERMABOND ADVANCED (GAUZE/BANDAGES/DRESSINGS) ×2
DERMABOND ADVANCED .7 DNX12 (GAUZE/BANDAGES/DRESSINGS) IMPLANT
DRAPE LAPAROTOMY 100X72X124 (DRAPES) ×2 IMPLANT
DRAPE MICROSCOPE LEICA (MISCELLANEOUS) ×2 IMPLANT
DRAPE POUCH INSTRU U-SHP 10X18 (DRAPES) ×2 IMPLANT
DRSG EMULSION OIL 3X3 NADH (GAUZE/BANDAGES/DRESSINGS) IMPLANT
DURASEAL APPLICATOR TIP (TIP) ×1 IMPLANT
DURASEAL SPINE SEALANT 3ML (MISCELLANEOUS) ×1 IMPLANT
ELECT REM PT RETURN 9FT ADLT (ELECTROSURGICAL) ×2
ELECTRODE REM PT RTRN 9FT ADLT (ELECTROSURGICAL) ×1 IMPLANT
GAUZE SPONGE 4X4 16PLY XRAY LF (GAUZE/BANDAGES/DRESSINGS) IMPLANT
GLOVE BIOGEL M 8.0 STRL (GLOVE) ×1 IMPLANT
GLOVE BIOGEL PI IND STRL 8 (GLOVE) ×1 IMPLANT
GLOVE BIOGEL PI INDICATOR 8 (GLOVE) ×2
GLOVE ECLIPSE 7.5 STRL STRAW (GLOVE) ×4 IMPLANT
GLOVE EXAM NITRILE LRG STRL (GLOVE) IMPLANT
GLOVE EXAM NITRILE MD LF STRL (GLOVE) ×1 IMPLANT
GLOVE EXAM NITRILE XL STR (GLOVE) IMPLANT
GLOVE EXAM NITRILE XS STR PU (GLOVE) IMPLANT
GOWN BRE IMP SLV AUR LG STRL (GOWN DISPOSABLE) ×3 IMPLANT
GOWN BRE IMP SLV AUR XL STRL (GOWN DISPOSABLE) IMPLANT
GOWN STRL REIN 2XL LVL4 (GOWN DISPOSABLE) ×1 IMPLANT
HEMOSTAT POWDER SURGIFOAM 1G (HEMOSTASIS) ×1 IMPLANT
KIT BASIN OR (CUSTOM PROCEDURE TRAY) ×2 IMPLANT
KIT ROOM TURNOVER OR (KITS) ×2 IMPLANT
NDL HYPO 18GX1.5 BLUNT FILL (NEEDLE) IMPLANT
NDL SPNL 18GX3.5 QUINCKE PK (NEEDLE) ×1 IMPLANT
NDL SPNL 22GX3.5 QUINCKE BK (NEEDLE) ×1 IMPLANT
NEEDLE HYPO 18GX1.5 BLUNT FILL (NEEDLE) IMPLANT
NEEDLE SPNL 18GX3.5 QUINCKE PK (NEEDLE) ×4 IMPLANT
NEEDLE SPNL 22GX3.5 QUINCKE BK (NEEDLE) ×4 IMPLANT
NS IRRIG 1000ML POUR BTL (IV SOLUTION) ×2 IMPLANT
PACK LAMINECTOMY NEURO (CUSTOM PROCEDURE TRAY) ×2 IMPLANT
PAD ARMBOARD 7.5X6 YLW CONV (MISCELLANEOUS) ×6 IMPLANT
PATTIES SURGICAL .5 X.5 (GAUZE/BANDAGES/DRESSINGS) ×1 IMPLANT
PATTIES SURGICAL .5 X1 (DISPOSABLE) ×2 IMPLANT
PATTIES SURGICAL .5 X3 (DISPOSABLE) ×1 IMPLANT
RUBBERBAND STERILE (MISCELLANEOUS) ×4 IMPLANT
SPONGE GAUZE 4X4 12PLY (GAUZE/BANDAGES/DRESSINGS) ×1 IMPLANT
SPONGE LAP 4X18 X RAY DECT (DISPOSABLE) ×1 IMPLANT
SPONGE NEURO XRAY DETECT 1X3 (DISPOSABLE) ×1 IMPLANT
SPONGE SURGIFOAM ABS GEL 100 (HEMOSTASIS) ×1 IMPLANT
SPONGE SURGIFOAM ABS GEL SZ50 (HEMOSTASIS) ×2 IMPLANT
STRIP CLOSURE SKIN 1/2X4 (GAUZE/BANDAGES/DRESSINGS) IMPLANT
SUT BONE WAX W31G (SUTURE) ×1 IMPLANT
SUT PROLENE 6 0 BV (SUTURE) IMPLANT
SUT VIC AB 1 CT1 18XBRD ANBCTR (SUTURE) ×1 IMPLANT
SUT VIC AB 1 CT1 8-18 (SUTURE) ×6
SUT VIC AB 2-0 CP2 18 (SUTURE) ×4 IMPLANT
SUT VIC AB 3-0 SH 8-18 (SUTURE) IMPLANT
SYR 20CC LL (SYRINGE) ×2 IMPLANT
SYR 5ML LL (SYRINGE) IMPLANT
TOWEL OR 17X24 6PK STRL BLUE (TOWEL DISPOSABLE) ×2 IMPLANT
TOWEL OR 17X26 10 PK STRL BLUE (TOWEL DISPOSABLE) ×2 IMPLANT
WATER STERILE IRR 1000ML POUR (IV SOLUTION) ×2 IMPLANT

## 2012-08-18 NOTE — Plan of Care (Signed)
Problem: Consults Goal: Diagnosis - Spinal Surgery Outcome: Completed/Met Date Met:  08/18/12 Lumbar Laminectomy (Complex)

## 2012-08-18 NOTE — H&P (Signed)
Subjective: Patient is a 62 y.o. 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  Patient says low back pain for many years, but has had increasing radicular symptoms into the lower extremities with increasing numbness to the thighs, leg, and feet bilaterally. It is typically brought on by standing, walking, and activity. Neurologic examination shows intact strength. Patient is admitted for an L2 and L5 decompressive lumbar laminectomy.    Past Medical History  Diagnosis Date  . Hypertension   . Chronic kidney disease   . GERD (gastroesophageal reflux disease)   . Arthritis   . Anemia   . Gout     Past Surgical History  Procedure Laterality Date  . Cholecystectomy  1979  . Appendectomy  1963  . Wrist tendon transfer Bilateral 1972 left and 1973 right  . Foot surgery Left 1981  . Shoulder arthroscopy with rotator cuff repair Right 2001  . Carpal tunnel release Bilateral 2006 right and 2007 left   . Joint replacement Right 2008   . Joint replacement Left 2010    Prescriptions prior to admission  Medication Sig Dispense Refill  . acetaminophen (TYLENOL) 650 MG CR tablet Take 1,300 mg by mouth every morning.      . calcitRIOL (ROCALTROL) 0.25 MCG capsule Take 0.25 mcg by mouth every morning.      . febuxostat (ULORIC) 40 MG tablet Take 40 mg by mouth every morning.      . furosemide (LASIX) 80 MG tablet Take 80 mg by mouth 2 (two) times daily. Takes 80mg  at breakfast, and 80mg  at lunch, daily      . gabapentin (NEURONTIN) 100 MG capsule Take 100 mg by mouth 2 (two) times daily.       Marland Kitchen HYDROcodone-acetaminophen (VICODIN) 5-500 MG per tablet Take 1 tablet by mouth 2 (two) times daily as needed for pain.      Marland Kitchen losartan (COZAAR) 25 MG tablet Take 25 mg by mouth daily.      Marland Kitchen omega-3 acid ethyl esters (LOVAZA) 1 G capsule Take 2 g by mouth 2 (two)  times daily.      Marland Kitchen omeprazole (PRILOSEC) 20 MG capsule Take 20 mg by mouth daily.      . simvastatin (ZOCOR) 20 MG tablet Take 20 mg by mouth every morning.      . sodium bicarbonate 650 MG tablet Take 1,300 mg by mouth 2 (two) times daily.      . vitamin C (ASCORBIC ACID) 500 MG tablet Take 500 mg by mouth every morning.      . colchicine 0.6 MG tablet Take 0.6 mg by mouth daily as needed (for gout flareups).        Allergies  Allergen Reactions  . Other Swelling    "Symvisc"   . Sulfa Antibiotics Rash    History  Substance Use Topics  . Smoking status: Former Smoker -- 1.00 packs/day for 10 years    Types: Cigarettes    Quit date: 08/13/1982  . Smokeless tobacco: Former Systems developer  . Alcohol Use: Yes     Comment: rarely    History reviewed. No pertinent family history.   Review of Systems A comprehensive review of systems was negative.  Objective: Vital signs in last 24 hours: Temp:  [97.7 F (36.5 C)] 97.7 F (36.5 C) (07/03 0709) Pulse Rate:  [71] 71 (07/03 0709) Resp:  [18] 18 (07/03 0709)  BP: (133)/(81) 133/81 mmHg (07/03 0709) SpO2:  [95 %] 95 % (07/03 0709)  EXAM: Patient is a morbidly obese (BMI 43) male in no acute distress. Lungs are clear to auscultation , the patient has symmetrical respiratory excursion. Heart has a regular rate and rhythm normal S1 and S2 no murmur.   Abdomen is soft nontender nondistended bowel sounds are present. Extremity examination shows no clubbing cyanosis or edema. Motor examination shows 5 over 5 strength in the lower extremities including the iliopsoas quadriceps dorsiflexor extensor hallicus  longus and plantar flexor bilaterally. Sensation is intact to pinprick in the distal lower extremities. Reflexes are symmetrical bilaterally. No pathologic reflexes are present. Patient has a normal gait and stance.   Data Review:CBC    Component Value Date/Time   WBC 6.1 08/12/2012 1021   RBC 4.25 08/12/2012 1021   HGB 13.7 08/12/2012 1021   HCT  39.7 08/12/2012 1021   PLT 184 08/12/2012 1021   MCV 93.4 08/12/2012 1021   MCH 32.2 08/12/2012 1021   MCHC 34.5 08/12/2012 1021   RDW 13.1 08/12/2012 1021   LYMPHSABS 1.4 02/29/2008 1252   MONOABS 0.4 02/29/2008 1252   EOSABS 0.2 02/29/2008 1252   BASOSABS 0.0 02/29/2008 1252                          BMET    Component Value Date/Time   NA 136 08/12/2012 1021   K 4.3 08/12/2012 1021   CL 100 08/12/2012 1021   CO2 27 08/12/2012 1021   GLUCOSE 90 08/12/2012 1021   BUN 56* 08/12/2012 1021   CREATININE 3.47* 08/12/2012 1021   CALCIUM 9.2 08/12/2012 1021   GFRNONAA 17* 08/12/2012 1021   GFRAA 20* 08/12/2012 1021     Assessment/Plan: Patient with increasing neurogenic claudication secondary to lumbar stenosis, who is admitted now for a decompressive lumbar laminectomy.  I've discussed with the patient the nature of his condition, the nature the surgical procedure, the typical length of surgery, hospital stay, and overall recuperation. We discussed limitations postoperatively. I discussed risks of surgery including risks of infection, bleeding, possibly need for transfusion, the risk of nerve root dysfunction with pain, weakness, numbness, or paresthesias, or risk of dural tear and CSF leakage and possible need for further surgery, and the risk of anesthetic complications including myocardial infarction, stroke, pneumonia, and death. Understanding all this the patient does wish to proceed with surgery and is admitted for such.    Hosie Spangle, MD 08/18/2012 7:35 AM

## 2012-08-18 NOTE — Transfer of Care (Signed)
Immediate Anesthesia Transfer of Care Note  Patient: Richard Bush  Procedure(s) Performed: Procedure(s) with comments: LUMBAR LAMINECTOMY/DECOMPRESSION MICRODISCECTOMY 3 LEVELS (N/A) - Lumbar Two through Five Laminectomy  Patient Location: PACU  Anesthesia Type:General  Level of Consciousness: awake, alert  and oriented  Airway & Oxygen Therapy: Patient Spontanous Breathing and Patient connected to face mask oxygen  Post-op Assessment: Report given to PACU RN, Post -op Vital signs reviewed and stable and Patient moving all extremities X 4  Post vital signs: Reviewed and stable  Complications: No apparent anesthesia complications

## 2012-08-18 NOTE — Anesthesia Procedure Notes (Signed)
Procedure Name: Intubation Date/Time: 08/18/2012 10:03 AM Performed by: Rush Farmer E Pre-anesthesia Checklist: Patient identified, Emergency Drugs available, Suction available, Patient being monitored and Timeout performed Patient Re-evaluated:Patient Re-evaluated prior to inductionOxygen Delivery Method: Circle system utilized Preoxygenation: Pre-oxygenation with 100% oxygen Intubation Type: IV induction Ventilation: Two handed mask ventilation required, Oral airway inserted - appropriate to patient size and Mask ventilation without difficulty Laryngoscope Size: Mac and 4 Grade View: Grade II Tube type: Oral Tube size: 7.5 mm Number of attempts: 1 Airway Equipment and Method: Stylet Placement Confirmation: ETT inserted through vocal cords under direct vision,  positive ETCO2 and breath sounds checked- equal and bilateral Secured at: 22 cm Tube secured with: Tape Dental Injury: Teeth and Oropharynx as per pre-operative assessment

## 2012-08-18 NOTE — Anesthesia Postprocedure Evaluation (Signed)
  Anesthesia Post-op Note  Patient: Richard Bush  Procedure(s) Performed: Procedure(s) with comments: LUMBAR LAMINECTOMY/DECOMPRESSION MICRODISCECTOMY 3 LEVELS (N/A) - Lumbar Two through Five Laminectomy  Patient Location: PACU  Anesthesia Type:General  Level of Consciousness: awake, alert , oriented and patient cooperative  Airway and Oxygen Therapy: Patient Spontanous Breathing and Patient connected to nasal cannula oxygen  Post-op Pain: none  Post-op Assessment: Post-op Vital signs reviewed, Patient's Cardiovascular Status Stable, Respiratory Function Stable, Patent Airway, No signs of Nausea or vomiting and Pain level controlled  Post-op Vital Signs: Reviewed and stable  Complications: No apparent anesthesia complications

## 2012-08-18 NOTE — Progress Notes (Signed)
Filed Vitals:   08/18/12 1519 08/18/12 1530 08/18/12 1534 08/18/12 1555  BP: 126/56  127/58 135/68  Pulse: 65 67 65 56  Temp:  97.6 F (36.4 C)  97.5 F (36.4 C)  Resp: 11 11 13    Height:      Weight:      SpO2: 96%  96% 96%    Patient resting in bed, having some difficulties with nausea and vomiting. Given antiemetic. Dressing clean and dry. Moving all 4 extremities well. Foley to straight drainage, we'll discontinue once up and about.  Plan: Will continue to progress to postoperative recovery. Encouraged to ambulate in halls.  Hosie Spangle, MD 08/18/2012, 5:17 PM

## 2012-08-18 NOTE — Op Note (Signed)
08/18/2012  2:00 PM  PATIENT:  Richard Bush  62 y.o. 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  ANESTHESIA:   general  EBL:  Total I/O In: 1000 [I.V.:1000] Out: 425 [Urine:75; Blood:350]  BLOOD ADMINISTERED:none  COUNT: correct per nursing staff  DICTATION: Patient was brought to the operating room placed under general endotracheal anesthesia. Patient was turned to a prone position the lumbar region was prepped with Betadine soap and solution and draped in a sterile fashion. The midline was infiltrated with local anesthetic with epinephrine. A localizing x-ray was taken and then a midline incision was made carried down thru the subcutaneous tissue, bipolar cautery and electrocautery were used to maintain hemostasis. Dissection was carried down to the lumbar fascia which was incised bilaterally and the paraspinal muscles were dissected from the spinous process and lamina in a subperiosteal fashion. Another localizing x-ray was taken and the L2-L5 levels were identified. Laminectomy was begun with double-action rongeurs a high-speed drill and Kerrison punches. The thickened ligamentum flavum was carefully removed. Dissection was carried laterally, performing a medial facetectomy to decompress the lateral stenosis taking care to leave the facet complexes intact. We then carefully removed the thickened ligamentum extending into  the neural foramen, so as to decompress the exiting L2, L3, L4, and L5 nerve roots. The operating microscope was used to carefully examine the exiting nerve roots and extensive decompression. Duraseal was injected over a thinned area of dura, on the dorsum of the thecal sac, at the L4-5 level, although no CSF leak was seen.  Once the decompression was completed hemostasis was established with the use of Gelfoam with thrombin. Paraspinal muscles, deep fascia, and Scarpa's fascia were closed in separate layers with interrupted 1 undyed Vicryl sutures. The subcutaneous and subcuticular were closed with interrupted inverted 2-0 undyed Vicryl sutures. Skin edges were approximated with Dermabond.   PLAN OF CARE: Admit for overnight observation  PATIENT DISPOSITION:  PACU - hemodynamically stable.   Delay start of Pharmacological VTE agent (>24hrs) due to surgical blood loss or risk of bleeding:  yes

## 2012-08-18 NOTE — Anesthesia Preprocedure Evaluation (Addendum)
Anesthesia Evaluation  Patient identified by MRN, date of birth, ID band Patient awake    Reviewed: Allergy & Precautions, H&P , NPO status , Patient's Chart, lab work & pertinent test results  History of Anesthesia Complications Negative for: history of anesthetic complications  Airway Mallampati: I TM Distance: >3 FB Neck ROM: Full    Dental  (+) Dental Advisory Given and Chipped   Pulmonary former smoker (quit 1984),  breath sounds clear to auscultation  Pulmonary exam normal       Cardiovascular hypertension, Pt. on medications Rhythm:Regular Rate:Normal     Neuro/Psych negative neurological ROS     GI/Hepatic Neg liver ROS, GERD-  Medicated and Controlled,  Endo/Other  Morbid obesity  Renal/GU Renal InsufficiencyRenal disease (Ig A nephropathy: creat 3.47)     Musculoskeletal   Abdominal (+) + obese,   Peds  Hematology   Anesthesia Other Findings   Reproductive/Obstetrics                         Anesthesia Physical Anesthesia Plan  ASA: III  Anesthesia Plan: General   Post-op Pain Management:    Induction: Intravenous  Airway Management Planned: Oral ETT  Additional Equipment:   Intra-op Plan:   Post-operative Plan: Extubation in OR  Informed Consent: I have reviewed the patients History and Physical, chart, labs and discussed the procedure including the risks, benefits and alternatives for the proposed anesthesia with the patient or authorized representative who has indicated his/her understanding and acceptance.   Dental advisory given  Plan Discussed with: CRNA and Surgeon  Anesthesia Plan Comments: (Plan routine monitors, GETA )        Anesthesia Quick Evaluation

## 2012-08-18 NOTE — Preoperative (Signed)
Beta Blockers   Reason not to administer Beta Blockers:Not Applicable 

## 2012-08-19 ENCOUNTER — Encounter (HOSPITAL_COMMUNITY): Payer: Self-pay | Admitting: General Practice

## 2012-08-19 LAB — BASIC METABOLIC PANEL
BUN: 51 mg/dL — ABNORMAL HIGH (ref 6–23)
CO2: 26 mEq/L (ref 19–32)
Calcium: 8.5 mg/dL (ref 8.4–10.5)
Chloride: 94 mEq/L — ABNORMAL LOW (ref 96–112)
Creatinine, Ser: 3.43 mg/dL — ABNORMAL HIGH (ref 0.50–1.35)
GFR calc Af Amer: 20 mL/min — ABNORMAL LOW (ref >90)
GFR calc non Af Amer: 18 mL/min — ABNORMAL LOW (ref >90)
Glucose, Bld: 102 mg/dL — ABNORMAL HIGH (ref 70–99)
Potassium: 4.6 mEq/L (ref 3.5–5.1)
Sodium: 131 mEq/L — ABNORMAL LOW (ref 135–145)

## 2012-08-19 MED ORDER — TAMSULOSIN HCL 0.4 MG PO CAPS
0.8000 mg | ORAL_CAPSULE | ORAL | Status: AC
  Administered 2012-08-19: 0.8 mg via ORAL
  Filled 2012-08-19: qty 2

## 2012-08-19 MED ORDER — TAMSULOSIN HCL 0.4 MG PO CAPS
0.4000 mg | ORAL_CAPSULE | Freq: Every day | ORAL | Status: DC
  Administered 2012-08-20: 0.4 mg via ORAL
  Filled 2012-08-19 (×3): qty 1

## 2012-08-19 NOTE — Progress Notes (Signed)
Filed Vitals:   08/18/12 1953 08/19/12 0000 08/19/12 0351 08/19/12 0753  BP: 129/73 127/72 119/73 97/61  Pulse: 67 69 70 71  Temp: 97.6 F (36.4 C) 97.8 F (36.6 C) 99 F (37.2 C) 98.9 F (37.2 C)  TempSrc: Oral Oral Oral Oral  Resp: 16 16 16 18   Height:      Weight:      SpO2: 93% 96% 93% 93%    Patient's fairly comfortable, has been up and ambulating in the halls. Dressing removed, wound clean and dry. Foley DC'd last night, but unable to void, and a bladder scan showed greater than 400 cc, and Foley replaced. Moving all 4 extremities well.  Plan: Will start on Flomax, 0.8 mg by mouth now, and then 0.4 mg every morning starting tomorrow.  We'll check BMET today. Encouraged to ambulate in the halls. We'll plan another voiding trial tomorrow.   Hosie Spangle, MD 08/19/2012, 8:45 AM

## 2012-08-19 NOTE — Progress Notes (Signed)
Pt ambulated in the hall tonight with standby assist of RN.  Pt donned brace appropriately, tolerated ambulation well.  Pt back to bed without incidence.  Will encourage ambulation in the am.

## 2012-08-20 MED ORDER — OXYCODONE-ACETAMINOPHEN 5-325 MG PO TABS: 1.0000 | ORAL_TABLET | ORAL | Status: DC | PRN

## 2012-08-20 MED ORDER — TAMSULOSIN HCL 0.4 MG PO CAPS: 0.4000 mg | ORAL_CAPSULE | Freq: Every day | ORAL | Status: DC

## 2012-08-20 NOTE — Progress Notes (Signed)
Foley out at 0725, pt DTV by 1325.

## 2012-08-20 NOTE — Progress Notes (Signed)
Patient voided 200 ml of urine, bladder scan showed 188 ml retained. Pryor Curia, RN

## 2012-08-20 NOTE — Discharge Summary (Signed)
Physician Discharge Summary  Patient ID: Richard Bush MRN: [id number] DOB/AGE: Apr 12, 1950 62 y.o.  Admit date: 08/18/2012 Discharge date: 08/20/2012  Admission Diagnoses:  lumbar stenosis lumbar degenerative disc disease lumbar spondylosis, chronic renal insufficiency  Discharge Diagnoses:  lumbar stenosis lumbar degenerative disc disease lumbar spondylosis, chronic renal insufficiency, postoperative urinary retention  Discharged Condition: good  Hospital Course: Patient was admitted, underwent an L2, L3, L4, and L5 decompressive lumbar laminectomy. Postoperatively he has been up and ambulating in the halls, his wound is healing nicely. However he did have difficulty with urinary retention, and required replacement of his Foley catheter. He was started on Flomax, and a voiding trial was done on the day of discharge. He has voided 200 cc twice since the Foley was removed, and his PVR was 188 cc by bladder scan. He is being discharged to home on Flomax, with a one-month prescription, and instructions to followup with his nephrologist Dr. Arty Baumgartner at [facility] within 3 weeks, both about his chronic renal insufficiency, but also about the question of whether he should continue on the Flomax. We did check a postoperative BMET which did show stability of his BUN and creatinine. His wound is healing well, there is no erythema, swelling, or drainage. He is being discharged home with instructions regarding wound care and activities. He is to return for followup with me in 3 weeks.  Discharge Exam: Blood pressure 122/65, pulse 82, temperature 97.8 F (36.6 C), temperature source Oral, resp. rate 16, height 5\' 11"  (1.803 m), weight 137 kg (302 lb 0.5 oz), SpO2 96.00%.  Disposition: Home     Medication List         acetaminophen 650 MG CR tablet  Commonly known as:  TYLENOL  Take 1,300 mg by mouth every morning.     calcitRIOL 0.25 MCG capsule  Commonly known as:  ROCALTROL  Take  0.25 mcg by mouth every morning.     colchicine 0.6 MG tablet  Take 0.6 mg by mouth daily as needed (for gout flareups).     febuxostat 40 MG tablet  Commonly known as:  ULORIC  Take 40 mg by mouth every morning.     furosemide 80 MG tablet  Commonly known as:  LASIX  Take 80 mg by mouth 2 (two) times daily. Takes 80mg  at breakfast, and 80mg  at lunch, daily     gabapentin 100 MG capsule  Commonly known as:  NEURONTIN  Take 100 mg by mouth 2 (two) times daily.     HYDROcodone-acetaminophen 5-500 MG per tablet  Commonly known as:  VICODIN  Take 1 tablet by mouth 2 (two) times daily as needed for pain.     losartan 25 MG tablet  Commonly known as:  COZAAR  Take 25 mg by mouth daily.     omega-3 acid ethyl esters 1 G capsule  Commonly known as:  LOVAZA  Take 2 g by mouth 2 (two) times daily.     omeprazole 20 MG capsule  Commonly known as:  PRILOSEC  Take 20 mg by mouth daily.     oxyCODONE-acetaminophen 5-325 MG per tablet  Commonly known as:  PERCOCET/ROXICET  Take 1-2 tablets by mouth every 4 (four) hours as needed for pain.     simvastatin 20 MG tablet  Commonly known as:  ZOCOR  Take 20 mg by mouth every morning.     sodium bicarbonate 650 MG tablet  Take 1,300 mg by mouth 2 (two) times daily.  tamsulosin 0.4 MG Caps  Commonly known as:  FLOMAX  Take 1 capsule (0.4 mg total) by mouth daily after breakfast.     vitamin C 500 MG tablet  Commonly known as:  ASCORBIC ACID  Take 500 mg by mouth every morning.         Signed: Hosie Spangle, MD 08/20/2012, 10:19 AM

## 2012-08-20 NOTE — Progress Notes (Signed)
Patient voided 236ml of clear yellow urine. Christianne Borrow

## 2012-08-23 ENCOUNTER — Encounter (HOSPITAL_COMMUNITY): Payer: Self-pay | Admitting: Neurosurgery

## 2013-07-03 ENCOUNTER — Other Ambulatory Visit: Payer: Self-pay | Admitting: Orthopedic Surgery

## 2013-07-17 ENCOUNTER — Encounter (HOSPITAL_COMMUNITY): Payer: Self-pay

## 2013-07-17 NOTE — Pre-Procedure Instructions (Signed)
Richard Bush  07/17/2013   Your procedure is scheduled on:  Wednesday, June 10th   Report to [facility] Admitting at 10:50 AM.   Call this number if you have problems the morning of surgery: [phone]   Remember:   Do not eat food or drink liquids after midnight Tuesday.   Take these medicines the morning of surgery with A SIP OF WATER: Omeprazole, Hydrocodone   Do not wear jewelry - no rings or watches.  Do not wear lotions or colognes.   You may NOT  wear deodorant.   Men may shave face and neck.   Do not bring valuables to the hospital.  [facility] is not responsible for any belongings or valuables.               Contacts, dentures or bridgework may not be worn into surgery.  Leave suitcase in the car. After surgery it may be brought to your room.  For patients admitted to the hospital, discharge time is determined by your treatment team.    Name and phone number of your driver:    Special Instructions: "Preparing for Surgery" instruction sheet   Please read over the following fact sheets that you were given: Pain Booklet, Coughing and Deep Breathing, Blood Transfusion Information, MRSA Information and Surgical Site Infection Prevention

## 2013-07-18 ENCOUNTER — Encounter (HOSPITAL_COMMUNITY)
Admission: RE | Admit: 2013-07-18 | Discharge: 2013-07-18 | Disposition: A | Discharge status: Home / Self Care | Payer: Medicare Other | Source: Ambulatory Visit | Attending: Orthopedic Surgery | Admitting: Orthopedic Surgery

## 2013-07-18 DIAGNOSIS — I10 Essential (primary) hypertension: Secondary | ICD-10-CM | POA: Insufficient documentation

## 2013-07-18 DIAGNOSIS — Z01812 Encounter for preprocedural laboratory examination: Secondary | ICD-10-CM | POA: Insufficient documentation

## 2013-07-18 DIAGNOSIS — Z01818 Encounter for other preprocedural examination: Secondary | ICD-10-CM | POA: Insufficient documentation

## 2013-07-18 DIAGNOSIS — Z01810 Encounter for preprocedural cardiovascular examination: Secondary | ICD-10-CM | POA: Insufficient documentation

## 2013-07-18 LAB — URINALYSIS, ROUTINE W REFLEX MICROSCOPIC
Bilirubin Urine: NEGATIVE
GLUCOSE, UA: NEGATIVE mg/dL
Hgb urine dipstick: NEGATIVE
Ketones, ur: NEGATIVE mg/dL
LEUKOCYTES UA: NEGATIVE
Nitrite: NEGATIVE
PH: 6 (ref 5.0–8.0)
Protein, ur: 100 mg/dL — AB
Specific Gravity, Urine: 1.01 (ref 1.005–1.030)
Urobilinogen, UA: 0.2 mg/dL (ref 0.0–1.0)

## 2013-07-18 LAB — BASIC METABOLIC PANEL
BUN: 59 mg/dL — AB (ref 6–23)
CO2: 26 mEq/L (ref 19–32)
Calcium: 9.7 mg/dL (ref 8.4–10.5)
Chloride: 103 mEq/L (ref 96–112)
Creatinine, Ser: 3.17 mg/dL — ABNORMAL HIGH (ref 0.50–1.35)
GFR calc Af Amer: 22 mL/min — ABNORMAL LOW (ref >90)
GFR, EST NON AFRICAN AMERICAN: 19 mL/min — AB (ref >90)
Glucose, Bld: 94 mg/dL (ref 70–99)
POTASSIUM: 5.1 meq/L (ref 3.7–5.3)
Sodium: 142 mEq/L (ref 137–147)

## 2013-07-18 LAB — CBC WITH DIFFERENTIAL/PLATELET
BASOS PCT: 0 % (ref 0–1)
Basophils Absolute: 0 10*3/uL (ref 0.0–0.1)
EOS ABS: 0.2 10*3/uL (ref 0.0–0.7)
Eosinophils Relative: 4 % (ref 0–5)
HEMATOCRIT: 38.2 % — AB (ref 39.0–52.0)
HEMOGLOBIN: 12.5 g/dL — AB (ref 13.0–17.0)
Lymphocytes Relative: 31 % (ref 12–46)
Lymphs Abs: 1.9 10*3/uL (ref 0.7–4.0)
MCH: 30 pg (ref 26.0–34.0)
MCHC: 32.7 g/dL (ref 30.0–36.0)
MCV: 91.6 fL (ref 78.0–100.0)
MONO ABS: 0.4 10*3/uL (ref 0.1–1.0)
MONOS PCT: 7 % (ref 3–12)
NEUTROS PCT: 58 % (ref 43–77)
Neutro Abs: 3.5 10*3/uL (ref 1.7–7.7)
Platelets: 211 10*3/uL (ref 150–400)
RBC: 4.17 MIL/uL — ABNORMAL LOW (ref 4.22–5.81)
RDW: 13.7 % (ref 11.5–15.5)
WBC: 6.1 10*3/uL (ref 4.0–10.5)

## 2013-07-18 LAB — TYPE AND SCREEN
ABO/RH(D): B POS
Antibody Screen: NEGATIVE

## 2013-07-18 LAB — URINE MICROSCOPIC-ADD ON

## 2013-07-18 LAB — APTT: aPTT: 30 seconds (ref 24–37)

## 2013-07-18 LAB — PROTIME-INR
INR: 1.04 (ref 0.00–1.49)
Prothrombin Time: 13.4 seconds (ref 11.6–15.2)

## 2013-07-18 LAB — SURGICAL PCR SCREEN
MRSA, PCR: NEGATIVE
Staphylococcus aureus: NEGATIVE

## 2013-07-18 MED ORDER — CHLORHEXIDINE GLUCONATE 4 % EX LIQD: 60.0000 mL | Freq: Once | CUTANEOUS | Status: DC

## 2013-07-18 NOTE — Progress Notes (Signed)
This pt scored at an elevated risk for obstructive sleep apnea using the STOP BANG TOOL during a pre surgical testing

## 2013-07-19 NOTE — Progress Notes (Signed)
Anesthesia Chart Review:  Patient is a 63 year old male scheduled for right THA on 07/26/13 by Dr. Mayer Camel.  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 PCP is Dr. Darlina Sicilian.  Nephrologist is Dr. [name], last visit 04/12/13.  He was aware that patient was in need of THA in the near future and instructed him to hold Cozaar 48 hours preoperatively and avoid NSAIDS/COXII-I's.    EKG on 07/18/13 showed: SB @ 59 bpm, right BBB, LAFB, bifascicular block. 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  CXR on 07/18/13 showed: No acute cardiopulmonary process.  Preoperative labs noted.  BUN/Cr 59/3.17 which is within his baseline (his last BUN/Cr were 61/3.34 on 06/05/13 at [place]).  H/H 12.5/38.2. PT/PTT WNL.  His EKG and labs appear stable.  He tolerated surgery within the past year. If no acute changes then I would anticipate that he could proceed as planned.  George Hugh [facility]/Anesthesiology Phone [phone] 07/19/2013 4:54 PM

## 2013-07-21 NOTE — H&P (Signed)
TOTAL HIP ADMISSION H&P  Patient is admitted for right total hip arthroplasty.  Subjective:  Chief Complaint: right hip pain  HPI: Richard Bush, 63 y.o. male, has a history of pain and functional disability in the right hip(s) due to arthritis and patient has failed non-surgical conservative treatments for greater than 12 weeks to include NSAID's and/or analgesics, corticosteriod injections, weight reduction as appropriate and activity modification.  Onset of symptoms was gradual starting several years ago with gradually worsening course since that time.The patient noted no past surgery on the right hip(s).  Patient currently rates pain in the right hip at 10 out of 10 with activity. Patient has night pain, worsening of pain with activity and weight bearing, pain that interfers with activities of daily living and pain with passive range of motion. Patient has evidence of subchondral cysts, joint subluxation and joint space narrowing by imaging studies. This condition presents safety issues increasing the risk of falls.  There is no current active infection.  There are no active problems to display for this patient.  Past Medical History  Diagnosis Date  . Hypertension   . Chronic kidney disease   . GERD (gastroesophageal reflux disease)   . Arthritis   . Anemia   . Gout     Past Surgical History  Procedure Laterality Date  . Cholecystectomy  1979  . Appendectomy  1963  . Wrist tendon transfer Bilateral 1972 left and 1973 right  . Foot surgery Left 1981  . Shoulder arthroscopy with rotator cuff repair Right 2001  . Carpal tunnel release Bilateral 2006 right and 2007 left   . Joint replacement Right 2008   . Joint replacement Left 2010  . Lumbar laminectomy/decompression microdiscectomy N/A 08/18/2012    Procedure: LUMBAR LAMINECTOMY/DECOMPRESSION MICRODISCECTOMY 3 LEVELS;  Surgeon: Hosie Spangle, MD;  Location: [place] [facility];  Service: Neurosurgery;  Laterality: N/A;  Lumbar Two through  Five Laminectomy    No prescriptions prior to admission   Allergies  Allergen Reactions  . Other Swelling    "Symvisc"   . Sulfa Antibiotics Rash    History  Substance Use Topics  . Smoking status: Former Smoker -- 1.00 packs/day for 10 years    Types: Cigarettes    Quit date: 08/13/1982  . Smokeless tobacco: Former Systems developer  . Alcohol Use: Yes     Comment: rarely    No family history on file.   Review of Systems  Constitutional: Negative.   HENT: Negative.   Eyes: Negative.   Respiratory: Negative.   Cardiovascular: Negative.   Gastrointestinal: Negative.   Genitourinary: Negative.   Musculoskeletal: Positive for joint pain.  Skin: Negative.   Neurological: Negative.   Endo/Heme/Allergies: Negative.   Psychiatric/Behavioral: Negative.     Objective:  Physical Exam  Constitutional: He is oriented to person, place, and time. He appears well-developed and well-nourished.  HENT:  Head: Normocephalic and atraumatic.  Eyes: Pupils are equal, round, and reactive to light.  Neck: Normal range of motion. Neck supple.  Cardiovascular: Intact distal pulses.   Respiratory: Effort normal.  Musculoskeletal: He exhibits tenderness.  Patient has severe pain with any attempts at internal rotation of the right hip, foot tap is negative the skin over his right hip is intact.  He is neurovascular intact, but does have some subjective numbness to his toes.  Neurological: He is alert and oriented to person, place, and time.  Skin: Skin is warm and dry.  Psychiatric: He has a normal mood and affect. His  behavior is normal. Judgment and thought content normal.    Vital signs in last 24 hours:    Labs:   Estimated body mass index is 20.51 kg/(m^2) as calculated from the following:   Height as of 08/18/12: 5\' 11"  (1.803 m).   Weight as of 08/12/12: 66.679 kg (147 lb).   Imaging Review X-rays were ordered, performed, and interpreted by me today included; AP and lateral of the right  hip show end-stage arthritis bone-on-bone with lateral subluxation of the femoral head about 1 cm.  There is also some subchondral cysts in the acetabulum.  Assessment/Plan:  End stage arthritis, right hip(s)  The patient history, physical examination, clinical judgement of the provider and imaging studies are consistent with end stage degenerative joint disease of the right hip(s) and total hip arthroplasty is deemed medically necessary. The treatment options including medical management, injection therapy, arthroscopy and arthroplasty were discussed at length. The risks and benefits of total hip arthroplasty were presented and reviewed. The risks due to aseptic loosening, infection, stiffness, dislocation/subluxation,  thromboembolic complications and other imponderables were discussed.  The patient acknowledged the explanation, agreed to proceed with the plan and consent was signed. Patient is being admitted for inpatient treatment for surgery, pain control, PT, OT, prophylactic antibiotics, VTE prophylaxis, progressive ambulation and ADL's and discharge planning.The patient is planning to be discharged home with home health services

## 2013-07-25 MED ORDER — CEFAZOLIN SODIUM 10 G IJ SOLR
3.0000 g | INTRAMUSCULAR | Status: AC
  Administered 2013-07-26: 3 g via INTRAVENOUS
  Filled 2013-07-25: qty 3000

## 2013-07-25 MED ORDER — DEXTROSE-NACL 5-0.45 % IV SOLN: INTRAVENOUS | Status: DC

## 2013-07-26 ENCOUNTER — Encounter (HOSPITAL_COMMUNITY): Payer: Self-pay | Admitting: Certified Registered"

## 2013-07-26 ENCOUNTER — Inpatient Hospital Stay (HOSPITAL_COMMUNITY): Payer: Medicare Other

## 2013-07-26 ENCOUNTER — Encounter (HOSPITAL_COMMUNITY)
Admission: RE | Disposition: A | Discharge status: Home Health | Payer: Self-pay | Source: Ambulatory Visit | Attending: Orthopedic Surgery

## 2013-07-26 ENCOUNTER — Encounter (HOSPITAL_COMMUNITY): Payer: Medicare Other | Admitting: Vascular Surgery

## 2013-07-26 ENCOUNTER — Inpatient Hospital Stay (HOSPITAL_COMMUNITY)
Admission: RE | Admit: 2013-07-26 | Discharge: 2013-07-28 | DRG: 470 | Disposition: A | Discharge status: Home Health | Payer: Medicare Other | Source: Ambulatory Visit | Attending: Orthopedic Surgery | Admitting: Orthopedic Surgery

## 2013-07-26 ENCOUNTER — Inpatient Hospital Stay (HOSPITAL_COMMUNITY): Payer: Medicare Other | Admitting: Certified Registered"

## 2013-07-26 DIAGNOSIS — M109 Gout, unspecified: Secondary | ICD-10-CM | POA: Diagnosis present

## 2013-07-26 DIAGNOSIS — Z87891 Personal history of nicotine dependence: Secondary | ICD-10-CM

## 2013-07-26 DIAGNOSIS — Z79899 Other long term (current) drug therapy: Secondary | ICD-10-CM

## 2013-07-26 DIAGNOSIS — Z888 Allergy status to other drugs, medicaments and biological substances status: Secondary | ICD-10-CM

## 2013-07-26 DIAGNOSIS — Z9089 Acquired absence of other organs: Secondary | ICD-10-CM

## 2013-07-26 DIAGNOSIS — Z882 Allergy status to sulfonamides status: Secondary | ICD-10-CM

## 2013-07-26 DIAGNOSIS — Z7982 Long term (current) use of aspirin: Secondary | ICD-10-CM

## 2013-07-26 DIAGNOSIS — M1611 Unilateral primary osteoarthritis, right hip: Secondary | ICD-10-CM | POA: Diagnosis present

## 2013-07-26 DIAGNOSIS — I129 Hypertensive chronic kidney disease with stage 1 through stage 4 chronic kidney disease, or unspecified chronic kidney disease: Secondary | ICD-10-CM | POA: Diagnosis present

## 2013-07-26 DIAGNOSIS — K219 Gastro-esophageal reflux disease without esophagitis: Secondary | ICD-10-CM | POA: Diagnosis present

## 2013-07-26 DIAGNOSIS — M169 Osteoarthritis of hip, unspecified: Principal | ICD-10-CM | POA: Diagnosis present

## 2013-07-26 DIAGNOSIS — Z6841 Body Mass Index (BMI) 40.0 and over, adult: Secondary | ICD-10-CM

## 2013-07-26 DIAGNOSIS — M1610 Unilateral primary osteoarthritis, unspecified hip: Principal | ICD-10-CM | POA: Diagnosis present

## 2013-07-26 DIAGNOSIS — N189 Chronic kidney disease, unspecified: Secondary | ICD-10-CM | POA: Diagnosis present

## 2013-07-26 HISTORY — PX: TOTAL HIP ARTHROPLASTY: SHX124

## 2013-07-26 SURGERY — ARTHROPLASTY, HIP, TOTAL,POSTERIOR APPROACH
Anesthesia: General | Laterality: Right

## 2013-07-26 MED ORDER — ACETAMINOPHEN 650 MG RE SUPP: 650.0000 mg | Freq: Four times a day (QID) | RECTAL | Status: DC | PRN

## 2013-07-26 MED ORDER — NEOSTIGMINE METHYLSULFATE 10 MG/10ML IV SOLN
INTRAVENOUS | Status: DC | PRN
  Administered 2013-07-26: 3 mg via INTRAVENOUS

## 2013-07-26 MED ORDER — METHOCARBAMOL 1000 MG/10ML IJ SOLN
500.0000 mg | Freq: Four times a day (QID) | INTRAVENOUS | Status: DC | PRN
  Filled 2013-07-26: qty 5

## 2013-07-26 MED ORDER — HYDROMORPHONE HCL PF 1 MG/ML IJ SOLN
0.2500 mg | INTRAMUSCULAR | Status: DC | PRN
  Administered 2013-07-26 (×4): 0.5 mg via INTRAVENOUS

## 2013-07-26 MED ORDER — LIDOCAINE HCL (CARDIAC) 20 MG/ML IV SOLN
INTRAVENOUS | Status: DC | PRN
  Administered 2013-07-26: 100 mg via INTRAVENOUS

## 2013-07-26 MED ORDER — KCL IN DEXTROSE-NACL 20-5-0.45 MEQ/L-%-% IV SOLN
INTRAVENOUS | Status: DC
  Administered 2013-07-26 – 2013-07-27 (×2): via INTRAVENOUS
  Filled 2013-07-26 (×10): qty 1000

## 2013-07-26 MED ORDER — GABAPENTIN 100 MG PO CAPS
200.0000 mg | ORAL_CAPSULE | Freq: Every day | ORAL | Status: DC
  Administered 2013-07-26 – 2013-07-27 (×2): 200 mg via ORAL
  Filled 2013-07-26 (×3): qty 2

## 2013-07-26 MED ORDER — PHENOL 1.4 % MT LIQD: 1.0000 | OROMUCOSAL | Status: DC | PRN

## 2013-07-26 MED ORDER — BUPIVACAINE-EPINEPHRINE (PF) 0.5% -1:200000 IJ SOLN
INTRAMUSCULAR | Status: AC
  Filled 2013-07-26: qty 1.8

## 2013-07-26 MED ORDER — FEBUXOSTAT 40 MG PO TABS
40.0000 mg | ORAL_TABLET | Freq: Every morning | ORAL | Status: DC
  Administered 2013-07-27 – 2013-07-28 (×2): 40 mg via ORAL
  Filled 2013-07-26 (×2): qty 1

## 2013-07-26 MED ORDER — PANTOPRAZOLE SODIUM 40 MG PO TBEC
40.0000 mg | DELAYED_RELEASE_TABLET | Freq: Every day | ORAL | Status: DC
  Administered 2013-07-27 – 2013-07-28 (×2): 40 mg via ORAL
  Filled 2013-07-26 (×2): qty 1

## 2013-07-26 MED ORDER — TRANEXAMIC ACID 100 MG/ML IV SOLN
1000.0000 mg | INTRAVENOUS | Status: AC
  Administered 2013-07-26: 1000 mg via INTRAVENOUS
  Filled 2013-07-26: qty 10

## 2013-07-26 MED ORDER — HYDROMORPHONE HCL PF 1 MG/ML IJ SOLN
INTRAMUSCULAR | Status: AC
  Filled 2013-07-26: qty 1

## 2013-07-26 MED ORDER — PROMETHAZINE HCL 25 MG/ML IJ SOLN: 6.2500 mg | INTRAMUSCULAR | Status: DC | PRN

## 2013-07-26 MED ORDER — METOCLOPRAMIDE HCL 5 MG/ML IJ SOLN: 5.0000 mg | Freq: Three times a day (TID) | INTRAMUSCULAR | Status: DC | PRN

## 2013-07-26 MED ORDER — OXYCODONE HCL 5 MG PO TABS
5.0000 mg | ORAL_TABLET | Freq: Once | ORAL | Status: AC | PRN
  Administered 2013-07-26: 5 mg via ORAL

## 2013-07-26 MED ORDER — SODIUM CHLORIDE 0.9 % IR SOLN
Status: DC | PRN
Start: 2013-07-26 — End: 2013-07-26
  Administered 2013-07-26: 1

## 2013-07-26 MED ORDER — METHOCARBAMOL 500 MG PO TABS
ORAL_TABLET | ORAL | Status: AC
Start: 2013-07-26 — End: 2013-07-27
  Filled 2013-07-26: qty 1

## 2013-07-26 MED ORDER — SODIUM CHLORIDE 0.9 % IV SOLN
INTRAVENOUS | Status: DC
  Administered 2013-07-26 (×3): via INTRAVENOUS

## 2013-07-26 MED ORDER — MENTHOL 3 MG MT LOZG: 1.0000 | LOZENGE | OROMUCOSAL | Status: DC | PRN

## 2013-07-26 MED ORDER — DOCUSATE SODIUM 100 MG PO CAPS
100.0000 mg | ORAL_CAPSULE | Freq: Two times a day (BID) | ORAL | Status: DC
  Administered 2013-07-26 – 2013-07-28 (×4): 100 mg via ORAL
  Filled 2013-07-26 (×5): qty 1

## 2013-07-26 MED ORDER — FUROSEMIDE 80 MG PO TABS
80.0000 mg | ORAL_TABLET | Freq: Two times a day (BID) | ORAL | Status: DC
  Administered 2013-07-26 – 2013-07-28 (×4): 80 mg via ORAL
  Filled 2013-07-26 (×6): qty 1

## 2013-07-26 MED ORDER — BUPIVACAINE-EPINEPHRINE (PF) 0.5% -1:200000 IJ SOLN
INTRAMUSCULAR | Status: AC
  Filled 2013-07-26: qty 30

## 2013-07-26 MED ORDER — SODIUM BICARBONATE 650 MG PO TABS
1300.0000 mg | ORAL_TABLET | Freq: Two times a day (BID) | ORAL | Status: DC
  Administered 2013-07-26 – 2013-07-28 (×4): 1300 mg via ORAL
  Filled 2013-07-26 (×5): qty 2

## 2013-07-26 MED ORDER — DIPHENHYDRAMINE HCL 12.5 MG/5ML PO ELIX: 12.5000 mg | ORAL_SOLUTION | ORAL | Status: DC | PRN

## 2013-07-26 MED ORDER — ASPIRIN EC 325 MG PO TBEC
325.0000 mg | DELAYED_RELEASE_TABLET | Freq: Every day | ORAL | Status: DC
  Administered 2013-07-27 – 2013-07-28 (×2): 325 mg via ORAL
  Filled 2013-07-26 (×3): qty 1

## 2013-07-26 MED ORDER — SUCCINYLCHOLINE CHLORIDE 20 MG/ML IJ SOLN
INTRAMUSCULAR | Status: DC | PRN
  Administered 2013-07-26: 120 mg via INTRAVENOUS

## 2013-07-26 MED ORDER — OXYCODONE HCL 5 MG PO TABS
ORAL_TABLET | ORAL | Status: AC
  Filled 2013-07-26: qty 1

## 2013-07-26 MED ORDER — HYDROMORPHONE HCL PF 1 MG/ML IJ SOLN
1.0000 mg | INTRAMUSCULAR | Status: DC | PRN
  Administered 2013-07-26 – 2013-07-27 (×2): 1 mg via INTRAVENOUS
  Filled 2013-07-26 (×2): qty 1

## 2013-07-26 MED ORDER — PHENYLEPHRINE HCL 10 MG/ML IJ SOLN
INTRAMUSCULAR | Status: DC | PRN
  Administered 2013-07-26 (×5): 80 ug via INTRAVENOUS
  Administered 2013-07-26: 120 ug via INTRAVENOUS

## 2013-07-26 MED ORDER — SENNOSIDES-DOCUSATE SODIUM 8.6-50 MG PO TABS: 1.0000 | ORAL_TABLET | Freq: Every evening | ORAL | Status: DC | PRN

## 2013-07-26 MED ORDER — BISACODYL 5 MG PO TBEC: 5.0000 mg | DELAYED_RELEASE_TABLET | Freq: Every day | ORAL | Status: DC | PRN

## 2013-07-26 MED ORDER — OXYCODONE-ACETAMINOPHEN 5-325 MG PO TABS: 1.0000 | ORAL_TABLET | ORAL | Status: DC | PRN

## 2013-07-26 MED ORDER — ACETAMINOPHEN 325 MG PO TABS: 650.0000 mg | ORAL_TABLET | Freq: Four times a day (QID) | ORAL | Status: DC | PRN

## 2013-07-26 MED ORDER — FENTANYL CITRATE 0.05 MG/ML IJ SOLN
INTRAMUSCULAR | Status: DC | PRN
  Administered 2013-07-26: 50 ug via INTRAVENOUS
  Administered 2013-07-26: 150 ug via INTRAVENOUS
  Administered 2013-07-26: 50 ug via INTRAVENOUS

## 2013-07-26 MED ORDER — OXYCODONE HCL 5 MG/5ML PO SOLN: 5.0000 mg | Freq: Once | ORAL | Status: AC | PRN

## 2013-07-26 MED ORDER — ONDANSETRON HCL 4 MG/2ML IJ SOLN: 4.0000 mg | Freq: Four times a day (QID) | INTRAMUSCULAR | Status: DC | PRN

## 2013-07-26 MED ORDER — ROCURONIUM BROMIDE 100 MG/10ML IV SOLN
INTRAVENOUS | Status: DC | PRN
  Administered 2013-07-26: 50 mg via INTRAVENOUS

## 2013-07-26 MED ORDER — ONDANSETRON HCL 4 MG/2ML IJ SOLN
INTRAMUSCULAR | Status: DC | PRN
  Administered 2013-07-26: 4 mg via INTRAVENOUS

## 2013-07-26 MED ORDER — PROPOFOL 10 MG/ML IV BOLUS
INTRAVENOUS | Status: DC | PRN
  Administered 2013-07-26: 40 mg via INTRAVENOUS
  Administered 2013-07-26: 260 mg via INTRAVENOUS

## 2013-07-26 MED ORDER — MIDAZOLAM HCL 2 MG/2ML IJ SOLN
INTRAMUSCULAR | Status: AC
  Filled 2013-07-26: qty 2

## 2013-07-26 MED ORDER — OXYCODONE HCL 5 MG PO TABS
5.0000 mg | ORAL_TABLET | ORAL | Status: DC | PRN
  Administered 2013-07-26 – 2013-07-28 (×8): 10 mg via ORAL
  Filled 2013-07-26 (×9): qty 2

## 2013-07-26 MED ORDER — METOCLOPRAMIDE HCL 10 MG PO TABS: 5.0000 mg | ORAL_TABLET | Freq: Three times a day (TID) | ORAL | Status: DC | PRN

## 2013-07-26 MED ORDER — FENTANYL CITRATE 0.05 MG/ML IJ SOLN
INTRAMUSCULAR | Status: AC
  Filled 2013-07-26: qty 5

## 2013-07-26 MED ORDER — ONDANSETRON HCL 4 MG PO TABS: 4.0000 mg | ORAL_TABLET | Freq: Four times a day (QID) | ORAL | Status: DC | PRN

## 2013-07-26 MED ORDER — PROPOFOL 10 MG/ML IV BOLUS
INTRAVENOUS | Status: AC
  Filled 2013-07-26: qty 20

## 2013-07-26 MED ORDER — METHOCARBAMOL 500 MG PO TABS
500.0000 mg | ORAL_TABLET | Freq: Four times a day (QID) | ORAL | Status: DC | PRN
  Administered 2013-07-26 – 2013-07-27 (×3): 500 mg via ORAL
  Filled 2013-07-26 (×2): qty 1

## 2013-07-26 MED ORDER — ASPIRIN EC 325 MG PO TBEC: 325.0000 mg | DELAYED_RELEASE_TABLET | Freq: Two times a day (BID) | ORAL | Status: DC

## 2013-07-26 MED ORDER — MAGNESIUM CITRATE PO SOLN: 1.0000 | Freq: Once | ORAL | Status: AC | PRN

## 2013-07-26 MED ORDER — GLYCOPYRROLATE 0.2 MG/ML IJ SOLN
INTRAMUSCULAR | Status: DC | PRN
  Administered 2013-07-26: 0.4 mg via INTRAVENOUS

## 2013-07-26 MED ORDER — METHOCARBAMOL 500 MG PO TABS: 500.0000 mg | ORAL_TABLET | Freq: Two times a day (BID) | ORAL | Status: DC

## 2013-07-26 MED ORDER — CALCITRIOL 0.25 MCG PO CAPS
0.2500 ug | ORAL_CAPSULE | Freq: Every morning | ORAL | Status: DC
  Administered 2013-07-27 – 2013-07-28 (×2): 0.25 ug via ORAL
  Filled 2013-07-26 (×2): qty 1

## 2013-07-26 MED ORDER — MIDAZOLAM HCL 5 MG/5ML IJ SOLN
INTRAMUSCULAR | Status: DC | PRN
  Administered 2013-07-26: 2 mg via INTRAVENOUS

## 2013-07-26 MED ORDER — BUPIVACAINE-EPINEPHRINE 0.5% -1:200000 IJ SOLN
INTRAMUSCULAR | Status: DC | PRN
  Administered 2013-07-26: 50 mL

## 2013-07-26 SURGICAL SUPPLY — 52 items
BLADE SAW SGTL 18X1.27X75 (BLADE) ×2 IMPLANT
BRUSH FEMORAL CANAL (MISCELLANEOUS) IMPLANT
CAPT HIP PF COP ×1 IMPLANT
COVER BACK TABLE 24X17X13 BIG (DRAPES) IMPLANT
COVER SURGICAL LIGHT HANDLE (MISCELLANEOUS) ×3 IMPLANT
DRAPE ORTHO SPLIT 77X108 STRL (DRAPES) ×2
DRAPE PROXIMA HALF (DRAPES) ×2 IMPLANT
DRAPE SURG ORHT 6 SPLT 77X108 (DRAPES) ×1 IMPLANT
DRAPE U-SHAPE 47X51 STRL (DRAPES) ×2 IMPLANT
DRILL BIT 7/64X5 (BIT) ×2 IMPLANT
DRSG AQUACEL AG ADV 3.5X10 (GAUZE/BANDAGES/DRESSINGS) ×2 IMPLANT
DURAPREP 26ML APPLICATOR (WOUND CARE) ×2 IMPLANT
ELECT BLADE 4.0 EZ CLEAN MEGAD (MISCELLANEOUS) ×2
ELECT REM PT RETURN 9FT ADLT (ELECTROSURGICAL) ×2
ELECTRODE BLDE 4.0 EZ CLN MEGD (MISCELLANEOUS) IMPLANT
ELECTRODE REM PT RTRN 9FT ADLT (ELECTROSURGICAL) ×1 IMPLANT
GAUZE XEROFORM 1X8 LF (GAUZE/BANDAGES/DRESSINGS) ×2 IMPLANT
GLOVE BIO SURGEON STRL SZ7.5 (GLOVE) ×2 IMPLANT
GLOVE BIO SURGEON STRL SZ8.5 (GLOVE) ×4 IMPLANT
GLOVE BIOGEL PI IND STRL 8 (GLOVE) ×2 IMPLANT
GLOVE BIOGEL PI IND STRL 9 (GLOVE) ×1 IMPLANT
GLOVE BIOGEL PI INDICATOR 8 (GLOVE) ×2
GLOVE BIOGEL PI INDICATOR 9 (GLOVE) ×1
GOWN STRL REUS W/ TWL LRG LVL3 (GOWN DISPOSABLE) ×2 IMPLANT
GOWN STRL REUS W/ TWL XL LVL3 (GOWN DISPOSABLE) ×3 IMPLANT
GOWN STRL REUS W/TWL LRG LVL3 (GOWN DISPOSABLE) ×4
GOWN STRL REUS W/TWL XL LVL3 (GOWN DISPOSABLE) ×6
HANDPIECE INTERPULSE COAX TIP (DISPOSABLE)
HOOD PEEL AWAY FACE SHEILD DIS (HOOD) ×4 IMPLANT
KIT BASIN OR (CUSTOM PROCEDURE TRAY) ×2 IMPLANT
KIT ROOM TURNOVER OR (KITS) ×2 IMPLANT
MANIFOLD NEPTUNE II (INSTRUMENTS) ×2 IMPLANT
NEEDLE 22X1 1/2 (OR ONLY) (NEEDLE) ×2 IMPLANT
NS IRRIG 1000ML POUR BTL (IV SOLUTION) ×2 IMPLANT
PACK TOTAL JOINT (CUSTOM PROCEDURE TRAY) ×2 IMPLANT
PAD ARMBOARD 7.5X6 YLW CONV (MISCELLANEOUS) ×4 IMPLANT
PASSER SUT SWANSON 36MM LOOP (INSTRUMENTS) ×2 IMPLANT
PRESSURIZER FEMORAL UNIV (MISCELLANEOUS) IMPLANT
SET HNDPC FAN SPRY TIP SCT (DISPOSABLE) IMPLANT
SUT ETHIBOND 2 V 37 (SUTURE) ×2 IMPLANT
SUT VIC AB 0 CTB1 27 (SUTURE) ×2 IMPLANT
SUT VIC AB 1 CTX 36 (SUTURE) ×2
SUT VIC AB 1 CTX36XBRD ANBCTR (SUTURE) ×1 IMPLANT
SUT VIC AB 2-0 CTB1 (SUTURE) ×2 IMPLANT
SUT VIC AB 3-0 SH 27 (SUTURE) ×2
SUT VIC AB 3-0 SH 27X BRD (SUTURE) ×1 IMPLANT
SYR CONTROL 10ML LL (SYRINGE) ×2 IMPLANT
TOWEL OR 17X24 6PK STRL BLUE (TOWEL DISPOSABLE) ×2 IMPLANT
TOWEL OR 17X26 10 PK STRL BLUE (TOWEL DISPOSABLE) ×2 IMPLANT
TOWER CARTRIDGE SMART MIX (DISPOSABLE) IMPLANT
TRAY FOLEY CATH 14FR (SET/KITS/TRAYS/PACK) IMPLANT
WATER STERILE IRR 1000ML POUR (IV SOLUTION) ×8 IMPLANT

## 2013-07-26 NOTE — Interval H&P Note (Signed)
History and Physical Interval Note:  07/26/2013 12:35 PM  Richard Bush  has presented today for surgery, with the diagnosis of OSTEOARTHRITIS RIGHT HIP  The various methods of treatment have been discussed with the patient and family. After consideration of risks, benefits and other options for treatment, the patient has consented to  Procedure(s): TOTAL HIP ARTHROPLASTY (Right) as a surgical intervention .  The patient's history has been reviewed, patient examined, no change in status, stable for surgery.  I have reviewed the patient's chart and labs.  Questions were answered to the patient's satisfaction.     Kerin Salen

## 2013-07-26 NOTE — Op Note (Signed)
OPERATIVE REPORT    DATE OF PROCEDURE:  07/26/2013       PREOPERATIVE DIAGNOSIS:  OSTEOARTHRITIS RIGHT HIP                                                          POSTOPERATIVE DIAGNOSIS:  same                                                           PROCEDURE:  R total hip arthroplasty using a 56 mm DePuy Pinnacle  Cup, [employer], 10-degree polyethylene liner index superior  and posterior, a +0 36 mm ceramic head, a [id number] SROM stem, 20FL Sleeve   SURGEON: Arelene Moroni J    ASSISTANT:   [employer] K[employer]  (present throughout entire procedure and necessary for timely completion of the procedure)   ANESTHESIA: General BLOOD LOSS: 700 FLUID REPLACEMENT: 1800 crystalloid DRAINS: Foley Catheter URINE OUTPUT: 0000000 COMPLICATIONS: none    INDICATIONS FOR PROCEDURE: A 63 y.o. year-old With  [name]   for 3 years, x-rays show bone-on-bone arthritic changes. Despite conservative measures with observation, anti-inflammatory medicine, narcotics, use of a cane, has severe unremitting pain and can ambulate only a few blocks before resting.  Patient desires elective R total hip arthroplasty to decrease pain and increase function. The risks, benefits, and alternatives were discussed at length including but not limited to the risks of infection, bleeding, nerve injury, stiffness, blood clots, the need for revision surgery, cardiopulmonary complications, among others, and they were willing to proceed. Questions answered     PROCEDURE IN DETAIL: The patient was identified by armband,  received preoperative IV antibiotics in the holding area at [facility], taken to the operating room , appropriate anesthetic monitors  were attached and general endotracheal anesthesia induced. Foley catheter was inserted. Pt was rolled into the L lateral decubitus position and fixed there with a Stulberg Mark II pelvic clamp.  The R lower extremity was then prepped and  draped  in the usual sterile fashion from the ankle to the hemipelvis. A time-out  procedure was performed. The skin along the lateral hip and thigh  infiltrated with 10 mL of 0.5% Marcaine and epinephrine solution. We  then made a posterolateral approach to the hip. With a #10 blade, a 24 cm  incision was made through the skin and subcutaneous tissue down to the level of the  IT band. Small bleeders were identified and cauterized. The IT band was cut in  line with skin incision exposing the greater trochanter. A Cobra retractor was placed between the gluteus minimus and the superior hip joint capsule, and a spiked Cobra between the quadratus femoris and the inferior hip joint capsule. This isolated the short  external rotators and piriformis tendons. These were tagged with a #2 Ethibond  suture and cut off their insertion on the intertrochanteric crest. The posterior  capsule was then developed into an acetabular-based flap from Posterior Superior off of the acetabulum out over the femoral neck and back posterior inferior to the acetabular rim. This flap was tagged with two #2 Ethibond sutures and  retracted protecting the sciatic nerve. This exposed the arthritic femoral head and osteophytes. The hip was then flexed and internally rotated, dislocating the femoral head and a standard neck cut performed 1 fingerbreadth above the lesser trochanter.  A spiked Cobra was placed in the cotyloid notch and a Hohmann retractor was then used to lever the femur anteriorly off of the anterior pelvic column. A posterior-inferior wing retractor was placed at the junction of the acetabulum and the ischium completing the acetabular exposure.We then removed the peripheral osteophytes and labrum from the acetabulum. We then reamed the acetabulum up to 55 mm with basket reamers obtaining good coverage in all quadrants. We then irrigated with normal  saline solution and hammered into place a 56 mm pinnacle cup in 45   degrees of abduction and about 20 degrees of anteversion. More  peripheral osteophytes removed and a trial 10-degree liner placed with the  index superior-posterior. The hip was then flexed and internally rotated exposing the  proximal femur, which was entered with the initiating reamer followed by  the axial reamers up to a 15.5 mm full depth and 11mm partial depth. We then conically reamed to 17F to the correct depth for a 42 base neck. The calcar was milled to 17FL. A trial cone and stem was inserted in the 25 degrees anteversion, with a +0 66mm trial head. Trial reduction was then performed and excellent stability was noted with at 90 of flexion with 70 of internal rotation and then full extension with maximal external rotation. The hip could not be dislocated in full extension. The knee could easily flex  to about 130 degrees. We also stretched the abductors at this point,  because of the preexisting adductor contractures. All trial components  were then removed. The acetabulum was irrigated out with normal saline  solution. A titanium Apex [facility] was then screwed into place  followed by a 10-degree polyethylene liner index superior-posterior. On  the femoral side a 17FL ZTT1 sleeve was hammered into place, followed by a [id number] SROM stem in 25 degrees of anteversion. At this point, a +0 36 mm ceramic head was  hammered on the stem. The hip was reduced. We checked our stability  one more time and found it to be excellent. The wound was once again  thoroughly irrigated out with normal saline solution pulse lavage. The  capsular flap and short external rotators were repaired back to the  intertrochanteric crest through drill holes with a #2 Ethibond suture.  The IT band was closed with running 1 Vicryl suture. The subcutaneous  tissue with 0 and 2-0 undyed Vicryl suture and the skin with running  interlocking 3-0 nylon suture. Dressing of Xeroform and Mepilex was  then applied.  The patient was then unclamped, rolled supine, awaken extubated and taken to recovery room without difficulty in stable condition.   Marlisha Vanwyk J 07/26/2013, 3:06 PM

## 2013-07-26 NOTE — Anesthesia Postprocedure Evaluation (Signed)
Anesthesia Post Note  Patient: Richard Bush  Procedure(s) Performed: Procedure(s) (LRB): TOTAL HIP ARTHROPLASTY (Right)  Anesthesia type: General  Patient location: PACU  Post pain: Pain level controlled  Post assessment: Patient's Cardiovascular Status Stable  Last Vitals:  Filed Vitals:   07/26/13 1714  BP: 151/62  Pulse: 62  Temp: 36.4 C  Resp: 16    Post vital signs: Reviewed and stable  Level of consciousness: alert  Complications: No apparent anesthesia complications

## 2013-07-26 NOTE — Anesthesia Procedure Notes (Signed)
Procedure Name: Intubation Date/Time: 07/26/2013 12:52 PM Performed by: [name] B Pre-anesthesia Checklist: Patient identified, Emergency Drugs available, Suction available, Patient being monitored and Timeout performed Patient Re-evaluated:Patient Re-evaluated prior to inductionOxygen Delivery Method: Circle system utilized Preoxygenation: Pre-oxygenation with 100% oxygen Intubation Type: IV induction and Rapid sequence Grade View: Grade I Tube type: Oral Tube size: 7.5 mm Number of attempts: 1 Airway Equipment and Method: Stylet and Video-laryngoscopy Placement Confirmation: ETT inserted through vocal cords under direct vision,  positive ETCO2 and breath sounds checked- equal and bilateral Secured at: 23 cm Tube secured with: Tape Dental Injury: Teeth and Oropharynx as per pre-operative assessment

## 2013-07-26 NOTE — Transfer of Care (Signed)
Immediate Anesthesia Transfer of Care Note  Patient: Richard Bush  Procedure(s) Performed: Procedure(s): TOTAL HIP ARTHROPLASTY (Right)  Patient Location: PACU  Anesthesia Type:General  Level of Consciousness: awake, alert  and oriented  Airway & Oxygen Therapy: Patient Spontanous Breathing and Patient connected to nasal cannula oxygen  Post-op Assessment: Report given to PACU RN and Post -op Vital signs reviewed and stable  Post vital signs: Reviewed and stable  Complications: No apparent anesthesia complications

## 2013-07-26 NOTE — Progress Notes (Signed)
Utilization review completed.  

## 2013-07-26 NOTE — Anesthesia Preprocedure Evaluation (Addendum)
Anesthesia Evaluation  Patient identified by MRN, date of birth, ID band Patient awake    Reviewed: Allergy & Precautions, H&P , NPO status , Patient's Chart, lab work & pertinent test results  Airway Mallampati: I  Neck ROM: Full    Dental  (+) Teeth Intact, Caps   Pulmonary former smoker,  breath sounds clear to auscultation        Cardiovascular hypertension, Pt. on medications Rhythm:Regular Rate:Normal     Neuro/Psych    GI/Hepatic GERD-  ,  Endo/Other  Morbid obesity  Renal/GU Renal InsufficiencyRenal disease     Musculoskeletal   Abdominal (+) + obese,   Peds  Hematology  (+) anemia ,   Anesthesia Other Findings   Reproductive/Obstetrics                          Anesthesia Physical Anesthesia Plan  ASA: III  Anesthesia Plan: General   Post-op Pain Management:    Induction: Intravenous  Airway Management Planned: Oral ETT and Video Laryngoscope Planned  Additional Equipment:   Intra-op Plan:   Post-operative Plan:   Informed Consent: I have reviewed the patients History and Physical, chart, labs and discussed the procedure including the risks, benefits and alternatives for the proposed anesthesia with the patient or authorized representative who has indicated his/her understanding and acceptance.   Dental advisory given  Plan Discussed with: Anesthesiologist and Surgeon  Anesthesia Plan Comments:         Anesthesia Quick Evaluation

## 2013-07-26 NOTE — Progress Notes (Signed)
Orthopedic Tech Progress Note Patient Details:  Richard Bush 01-Jul-1950 [id number]  Ortho Devices Ortho Device/Splint Location: overhead frame on bed Ortho Device/Splint Interventions: [name] 07/26/2013, 7:56 PM

## 2013-07-27 ENCOUNTER — Encounter (HOSPITAL_COMMUNITY): Payer: Self-pay | Admitting: Orthopedic Surgery

## 2013-07-27 LAB — CBC
HCT: 32.5 % — ABNORMAL LOW (ref 39.0–52.0)
Hemoglobin: 10.1 g/dL — ABNORMAL LOW (ref 13.0–17.0)
MCH: 28.9 pg (ref 26.0–34.0)
MCHC: 31.1 g/dL (ref 30.0–36.0)
MCV: 92.9 fL (ref 78.0–100.0)
PLATELETS: 182 10*3/uL (ref 150–400)
RBC: 3.5 MIL/uL — ABNORMAL LOW (ref 4.22–5.81)
RDW: 14 % (ref 11.5–15.5)
WBC: 7.7 10*3/uL (ref 4.0–10.5)

## 2013-07-27 LAB — BASIC METABOLIC PANEL
BUN: 51 mg/dL — ABNORMAL HIGH (ref 6–23)
CALCIUM: 8.1 mg/dL — AB (ref 8.4–10.5)
CO2: 25 mEq/L (ref 19–32)
CREATININE: 3.42 mg/dL — AB (ref 0.50–1.35)
Chloride: 105 mEq/L (ref 96–112)
GFR calc Af Amer: 20 mL/min — ABNORMAL LOW (ref >90)
GFR calc non Af Amer: 18 mL/min — ABNORMAL LOW (ref >90)
Glucose, Bld: 125 mg/dL — ABNORMAL HIGH (ref 70–99)
Potassium: 5.4 mEq/L — ABNORMAL HIGH (ref 3.7–5.3)
SODIUM: 140 meq/L (ref 137–147)

## 2013-07-27 NOTE — Progress Notes (Signed)
Patient voided 250 ml at 2305; bladder scan performed and 333 ml observed.  Patient states that Flomax has helped him void in the past.  On call, Grier Mitts, PA contacted.  Orders to perform 1 additional intermittent/straight cath; 525 ml obtained.  Per PA, patient will be reevaluated during morning rounds by the MD/PA to determine next steps.  Will continue to monitor.

## 2013-07-27 NOTE — Progress Notes (Signed)
Richard Bush, [place] answered page re: patient's hip "pop" as noted in PT note. Patient without hematoma or change in mobility or pain level.  Continue to monitor. No new order.

## 2013-07-27 NOTE — Evaluation (Signed)
Agree with SPT.    Anadalay Macdonell, PT [phone]  

## 2013-07-27 NOTE — Care Management Note (Signed)
CARE MANAGEMENT NOTE 07/27/2013  Patient:  MEIKO, MAROHN   Account Number:  [id number]  Date Initiated:  07/27/2013  Documentation initiated by:  Ricki Miller  Subjective/Objective Assessment:   63 yr old male s/p right total hip arthroplasty.     Action/Plan:   Case manager spoke with patient concerning home health and DME needs at discharge. States he was preoperatively setup with [facility]. CM faxed orders to [facility]. Has rolling walker and 3in1. Has Family support.   Anticipated DC Date:  07/28/2013   Anticipated DC Plan:  [place]  CM consult      [facility] Choice  HOME HEALTH   Choice offered to / List presented to:  C-1 Patient   DME arranged  NA        [place] arranged  HH-2 PT      [place] agency  [facility]   Status of service:  Completed, signed off Medicare Important Message given?  NA - LOS <3 / Initial given by admissions (If response is "NO", the following Medicare IM given date fields will be blank) Date Medicare IM given:  07/18/2013 Date Additional Medicare IM given:    Discharge Disposition:  [place]  Per UR Regulation:  Reviewed for med. necessity/level of care/duration of stay

## 2013-07-27 NOTE — Progress Notes (Signed)
PATIENT REQUESTED OXYCODONE FOR PAIN OF 7/10 PRIOR TO P.T. - NOT TIME YET- RETURNED AND DILAUDID GIVEN.

## 2013-07-27 NOTE — Plan of Care (Signed)
Problem: Consults Goal: Diagnosis- Total Joint Replacement Primary Total Hip Right     

## 2013-07-27 NOTE — Progress Notes (Signed)
Physical Therapy Treatment Patient Details Name: Richard Bush MRN: [id number] DOB: 08/25/1950 Today's Date: 07/27/2013    History of Present Illness 63 y.o. male s/p R THA with posterior approach.  Hx of HTN.    PT Comments    Pt ambulated ~300 ft this session with min-guard for safety and VC for posture and turning strategy with RW to maintain posterior hip precautions.  While lowering leg rest on chair, pt and PT felt and heard a pop in his L LE that the pt believes came from his R hip.  Pt did not report notable increase in pain after the pop and did not demonstrate visible different in movement or gait.  RN was notified.  Pt requested pain meds at the end of PT session but stated that he was ready for pain meds before starting this session.  Will continue to follow.   Follow Up Recommendations  Home health PT;Supervision/Assistance - 24 hour     Equipment Recommendations  None recommended by PT    Recommendations for Other Services       Precautions / Restrictions Precautions Precautions: Fall;Posterior Hip Precaution Comments: Reviewed precautions with pt and his wife. Restrictions Weight Bearing Restrictions: Yes RLE Weight Bearing: Weight bearing as tolerated    Mobility  Bed Mobility               General bed mobility comments: Pt up in chair upon PT arrival.  Transfers Overall transfer level: Needs assistance Equipment used: Rolling walker (2 wheeled) Transfers: Sit to/from Stand Sit to Stand: Min guard         General transfer comment: Min guard for safety, VC for hand placement reminder.  Ambulation/Gait Ambulation/Gait assistance: Min guard Ambulation Distance (Feet): 300 Feet Assistive device: Rolling walker (2 wheeled) Gait Pattern/deviations: Step-to pattern;Decreased stance time - right;Decreased step length - left;Trunk flexed Gait velocity: decreased Gait velocity interpretation: Below normal speed for age/gender General Gait Details: VC  provided for upright posture and appropriate turning strategy to maintain posterior hip precautions.  Min-guard for safety.   Stairs            Wheelchair Mobility    Modified Rankin (Stroke Patients Only)       Balance Overall balance assessment: Needs assistance         Standing balance support: Bilateral upper extremity supported;During functional activity Standing balance-Leahy Scale: Poor Standing balance comment: Pt continues to need bil. UE support.                    Cognition Arousal/Alertness: Awake/alert Behavior During Therapy: WFL for tasks assessed/performed Overall Cognitive Status: Within Functional Limits for tasks assessed                      Exercises Total Joint Exercises Ankle Circles/Pumps: AROM;Both;10 reps;Seated Quad Sets: AROM;Right;10 reps;Seated    General Comments General comments (skin integrity, edema, etc.): While lowering leg rest on pt's chair, PT was holding pt's R LE to ease to the floor when pt and PT heard and felt a pop.  Pt feels that the pop came from his R hip.  Pt was anxious about the pop but did not describe increased pain after his foot was lowered to the floor.  Pt was able to complete the rest of today's therapy without increased pain or observable change in gait/movement.  RN notified.      Pertinent Vitals/Pain Pt reports increased pain of "25/10" upon PT arrival, but was willing  to continue with therapy.  Nurse notified.    Home Living                      Prior Function            PT Goals (current goals can now be found in the care plan section) Acute Rehab PT Goals Patient Stated Goal: Return home PT Goal Formulation: With patient/family Time For Goal Achievement: 08/04/13 Potential to Achieve Goals: Good Progress towards PT goals: Progressing toward goals    Frequency  7X/week    PT Plan Current plan remains appropriate    Co-evaluation             End of Session  Equipment Utilized During Treatment: Gait belt Activity Tolerance: Patient tolerated treatment well Patient left: in chair;with call bell/phone within reach;with nursing/sitter in room;with family/visitor present     Time: [id number] PT Time Calculation (min): 37 min  Charges:  $Gait Training: 23-37 mins                    G Codes:      Tawni Melkonian, SPT 07/27/2013, 3:02 PM

## 2013-07-27 NOTE — Evaluation (Signed)
I have read and agree with this note.   Time in/out: 14:42-15:15 Total time: 33 minutes (Ev, [place])  [place], OTR/L [phone]

## 2013-07-27 NOTE — Progress Notes (Signed)
Patient ID: Richard Bush, male   DOB: 1950/02/28, 63 y.o.   MRN: [id number] PATIENT ID: Richard Bush  MRN: [id number]  DOB/AGE:  1950/04/19 / 63 y.o.  1 Day Post-Op Procedure(s) (LRB): TOTAL HIP ARTHROPLASTY (Right)    PROGRESS NOTE Subjective: Patient is alert, oriented, no Nausea, no Vomiting, yes passing gas, no Bowel Movement. Taking PO well. Denies SOB, Chest or Calf Pain. Using Incentive Spirometer, PAS in place. Ambulate WBAT to day Patient reports pain as 3 on 0-10 scale  .    Objective: Vital signs in last 24 hours: Filed Vitals:   07/26/13 2022 07/27/13 0000 07/27/13 0400 07/27/13 0623  BP: 152/68   150/72  Pulse: 65   60  Temp: 97.8 F (36.6 C)   97.9 F (36.6 C)  TempSrc:      Resp: 16 16 16 18   Height:      Weight:      SpO2: 99% 98% 98% 99%      Intake/Output from previous day: I/O last 3 completed shifts: In: 3655 [P.O.:480; I.V.:3175] Out: 2500 [Urine:1800; Blood:700]   Intake/Output this shift:     LABORATORY DATA:  Recent Labs  07/27/13 0610  WBC 7.7  HGB 10.1*  HCT 32.5*  PLT 182  NA 140  K 5.4*  CL 105  CO2 25  BUN 51*  CREATININE 3.42*  GLUCOSE 125*  CALCIUM 8.1*    Examination: Neurologically intact ABD soft Neurovascular intact Sensation intact distally Intact pulses distally Dorsiflexion/Plantar flexion intact Incision: no drainage No cellulitis present Compartment soft} XR AP&Lat of hip shows well placed\fixed THA  Assessment:   1 Day Post-Op Procedure(s) (LRB): TOTAL HIP ARTHROPLASTY (Right) ADDITIONAL DIAGNOSIS:  Hypertension and Renal Insufficiency Chronic, morbid obesity  Plan: PT/OT WBAT, THA  posterior precautions  DVT Prophylaxis: SCDx72 hrs, ASA 325 mg BID x 2 weeks  DISCHARGE PLAN: Home,  DISCHARGE NEEDS: HHPT, HHRN, CPM, Walker and 3-in-1 comode seat

## 2013-07-27 NOTE — Evaluation (Signed)
Physical Therapy Evaluation Patient Details Name: Richard Bush MRN: [id number] DOB: 11/17/1950 Today's Date: 07/27/2013   History of Present Illness  63 y.o. male s/p R THA with posterior approach.  Hx of HTN.  Clinical Impression  Pt receiving IV pain meds upon PT arrival, pt still limited by pain during PT session.  Pt required mod-A to move R LE to EOB to sit EOB from supine but was able to power up to sitting with use of bedrails after VC for hand placement.  Min-A provided for sit to/from stand for safety and VC for hand placement.  Pt ambulated 5 ft in room with RW and was able to bear weight through R LE.  Pt plans to D/C to home with his wife available 24 hrs/day.  Pt and his wife report that he has a bariatric RW and 3-in-1 at home.  Pt will benefit from skilled PT services to increase ROM and strength, to improve mobility, and to increase awareness of precautions.  Will continue to follow.    Follow Up Recommendations Home health PT;Supervision/Assistance - 24 hour    Equipment Recommendations  None recommended by PT    Recommendations for Other Services       Precautions / Restrictions Precautions Precautions: Fall;Posterior Hip Precaution Booklet Issued: Yes (comment) Precaution Comments: Discussed precautions with pt and his wife. Restrictions Weight Bearing Restrictions: Yes RLE Weight Bearing: Weight bearing as tolerated      Mobility  Bed Mobility Overal bed mobility: Needs Assistance Bed Mobility: Supine to Sit     Supine to sit: Mod assist;HOB elevated     General bed mobility comments: VC for hand placement and use of bedrails, mod-A to help move R LE to EOB.  Transfers Overall transfer level: Needs assistance Equipment used: Rolling walker (2 wheeled) Transfers: Sit to/from Stand Sit to Stand: Min assist         General transfer comment: VC for hand placement and strategy.  Pt able to power up with min-A for  safety.  Ambulation/Gait Ambulation/Gait assistance: Min assist Ambulation Distance (Feet): 5 Feet Assistive device: Rolling walker (2 wheeled) Gait Pattern/deviations: Step-to pattern;Decreased stance time - right;Decreased step length - left;Decreased weight shift to right;Trunk flexed Gait velocity: decreased Gait velocity interpretation: Below normal speed for age/gender General Gait Details: Pt able to bear weight through R LE but expressed increased pain with ambulation.  Min-A for balance and safety.  Stairs            Wheelchair Mobility    Modified Rankin (Stroke Patients Only)       Balance Overall balance assessment: Needs assistance Sitting-balance support: Bilateral upper extremity supported;Feet supported Sitting balance-Leahy Scale: Poor Sitting balance - Comments: Pt maintains bil. UE support in sitting to decrease WB through R hip.   Standing balance support: Bilateral upper extremity supported;During functional activity Standing balance-Leahy Scale: Poor Standing balance comment: Pt requires bil. UE support on RW.                             Pertinent Vitals/Pain Pt reports increased pain with all movements.  Pt premedicated with IV pain meds as PT entered the room today.  Nurse is aware of pt's pain.    Home Living Family/patient expects to be discharged to:: Private residence Living Arrangements: Spouse/significant other Available Help at Discharge: Family;Available 24 hours/day Type of Home: House Home Access: Stairs to enter Entrance Stairs-Rails: None Entrance Stairs-Number of Steps: 1  Home Layout: Laundry or work area in basement;Able to live on main level with bedroom/bathroom Home Equipment: Environmental consultant - 2 wheels;Bedside commode Additional Comments: Pt has walk-in shower but is unsure if his home bariatric 3-in-1 will fit inside the shower stall.    Prior Function Level of Independence: Independent               Hand  Dominance        Extremity/Trunk Assessment   Upper Extremity Assessment: Defer to OT evaluation           Lower Extremity Assessment: RLE deficits/detail;LLE deficits/detail RLE Deficits / Details: Increased pain, decreased ROM and strength.  Pt reports peripheral neuropathy, but was able to detect light touch on examination. LLE Deficits / Details: Pt reports peripheral neuropathy, but was able to detect lig     Communication   Communication: No difficulties  Cognition Arousal/Alertness: Awake/alert Behavior During Therapy: WFL for tasks assessed/performed Overall Cognitive Status: Within Functional Limits for tasks assessed                      General Comments      Exercises Total Joint Exercises Ankle Circles/Pumps: AROM;20 reps;Both;Seated Quad Sets: AROM;Right;10 reps;Seated      Assessment/Plan    PT Assessment Patient needs continued PT services  PT Diagnosis Abnormality of gait;Acute pain   PT Problem List Decreased strength;Decreased range of motion;Decreased activity tolerance;Decreased balance;Decreased mobility;Decreased knowledge of use of DME;Decreased safety awareness;Decreased knowledge of precautions;Pain  PT Treatment Interventions DME instruction;Gait training;Stair training;Functional mobility training;Therapeutic activities;Therapeutic exercise;Balance training;Patient/family education   PT Goals (Current goals can be found in the Care Plan section) Acute Rehab PT Goals Patient Stated Goal: Return home PT Goal Formulation: With patient/family Time For Goal Achievement: 08/04/13 Potential to Achieve Goals: Good    Frequency 7X/week   Barriers to discharge        Co-evaluation               End of Session Equipment Utilized During Treatment: Gait belt Activity Tolerance: Patient limited by pain Patient left: in chair;with call bell/phone within reach;with family/visitor present Nurse Communication: Mobility status          Time: [id number] PT Time Calculation (min): 33 min   Charges:         PT G Codes:          Emilene Roma, SPT 07/27/2013, 12:09 PM

## 2013-07-27 NOTE — Clinical Social Work Note (Signed)
Referred to this CSW today for ?SNF. Chart reviewed and have spoken with RNCM as well as with patient- patient plans to d/c home with [facility] and DME along with his wife who can assist. CSW to sign off- please contact us if SW needs arise. Eduard Clos, MSW, [place]

## 2013-07-27 NOTE — Progress Notes (Signed)
Agree with SPT.    Jeno Calleros, PT [phone]  

## 2013-07-27 NOTE — Evaluation (Signed)
Occupational Therapy Evaluation Patient Details Name: Richard Bush MRN: [id number] DOB: 1950-10-25 Today's Date: 07/27/2013    History of Present Illness 63 y.o. male s/p R THA with posterior approach.  Hx of HTN.   Clinical Impression   Pt presents with generalized weakness, acute pain and limited ROM due to above interfering with his abilities to perform his ADLs independently. Pt was able to recall only 2/3 posterior hip precautions and they were reviewed with him and his wife in detail. Pt was educated on proper sit>stand transfer techniques while adhering to hip precautions. PTA pt required assistance from his wife for LB dressing due to hip pain and she will continue to assist him. Pt will benefit from acute OT to increase his functional mobility prior to d/c.Will continue to follow.     Follow Up Recommendations  Supervision/Assistance - 24 hour          Precautions / Restrictions Precautions Precautions: Fall;Posterior Hip Precaution Comments: Pt was only able to recall 2/3 precautions. Reviewed precautions with pt and his wife.  Restrictions Weight Bearing Restrictions: Yes RLE Weight Bearing: Weight bearing as tolerated      Mobility Bed Mobility               General bed mobility comments: Pt up in chair upon OT arrival.  Transfers Overall transfer level: Needs assistance Equipment used: Rolling walker (2 wheeled) Transfers: Sit to/from Stand Sit to Stand: Min guard         General transfer comment: Min guard for safety, VC for hand placement and precautions reminder    Balance Overall balance assessment: Needs assistance Sitting-balance support: Feet supported Sitting balance-Leahy Scale: Fair     Standing balance support: Single extremity supported;During functional activity Standing balance-Leahy Scale: Poor Standing balance comment: Pt continues to need bil. UE support.                            ADL Overall ADL's : Needs  assistance/impaired Eating/Feeding: Independent;Sitting   Grooming: Sitting;Set up   Upper Body Bathing: Sitting;Set up   Lower Body Bathing: Adhering to hip precautions;Sit to/from stand;Maximal assistance;With caregiver independent assisting;Cueing for safety   Upper Body Dressing : Set up;Sitting   Lower Body Dressing: Maximal assistance;Sit to/from stand;Adhering to hip precautions;With caregiver independent assisting;Cueing for safety   Toilet Transfer: Minimal assistance   Toileting- Clothing Manipulation and Hygiene: Moderate assistance;Sit to/from stand;Adhering to hip precautions       Functional mobility during ADLs: Minimal assistance General ADL Comments: Pt requires VC for hand placement and proper power-up/sit down technique for transfers while adhering to posterior hip precautions. Pt's wife will be assisting him with LB dressing/bathing at home and for other ADLs 24 hours a day.               Pertinent Vitals/Pain No c/o pain, pt was premedicated.        Extremity/Trunk Assessment Upper Extremity Assessment Upper Extremity Assessment: Overall WFL for tasks assessed   Lower Extremity Assessment Lower Extremity Assessment: Defer to PT evaluation       Communication Communication Communication: No difficulties   Cognition Arousal/Alertness: Awake/alert Behavior During Therapy: WFL for tasks assessed/performed Overall Cognitive Status: Within Functional Limits for tasks assessed       Memory: Decreased recall of precautions                        Home Living Family/patient expects  to be discharged to:: Private residence Living Arrangements: Spouse/significant other Available Help at Discharge: Family;Available 24 hours/day Type of Home: House Home Access: Stairs to enter [employer] of Steps: 1 Entrance Stairs-Rails: None Home Layout: Laundry or work area in basement;Able to live on main level with bedroom/bathroom      Bathroom Shower/Tub: Occupational psychologist: Standard (BSC over toilet)     Home Equipment: Environmental consultant - 2 wheels;Bedside commode (bariatric)   Additional Comments: Pt has walk-in shower but is unsure if his home bariatric 3-in-1 will fit inside the shower stall.      Prior Functioning/Environment Level of Independence: Needs assistance    ADL's / Homemaking Assistance Needed: wife helped him with LB dressing due to hip problems        OT Diagnosis: Generalized weakness;Acute pain   OT Problem List: Decreased strength;Decreased range of motion;Decreased activity tolerance;Decreased knowledge of use of DME or AE;Pain;Decreased knowledge of precautions;Impaired balance (sitting and/or standing);Obesity   OT Treatment/Interventions: Self-care/ADL training;Patient/family education;Balance training;DME and/or AE instruction;Therapeutic activities    OT Goals(Current goals can be found in the care plan section) Acute Rehab OT Goals Patient Stated Goal: Return home OT Goal Formulation: With patient/family Time For Goal Achievement: 08/03/13 Potential to Achieve Goals: Good ADL Goals Pt Will Perform Lower Body Bathing: with mod assist;with adaptive equipment;sit to/from stand (with caregiver assistance) Pt Will Perform Lower Body Dressing: with mod assist;with adaptive equipment;sit to/from stand (with caregiver assistance) Pt Will Transfer to Toilet: with min assist;ambulating;bedside commode (bariatric BSC) Pt Will Perform Toileting - Clothing Manipulation and hygiene: sit to/from stand;with min assist Pt Will Perform Tub/Shower Transfer: Shower transfer;with min assist;ambulating;3 in 1;rolling walker Additional ADL Goal #1: Pt will recall 3/3 posterior hip precautions for safety with ADLs and transfers  OT Frequency: Min 2X/week    End of Session Equipment Utilized During Treatment: Gait belt;Rolling walker  Activity Tolerance: Patient tolerated treatment well Patient  left: in chair;with call bell/phone within reach;with family/visitor present   Time:  -    Charges:    G-CodesLyda Perone 2013/08/19, 4:15 PM

## 2013-07-28 LAB — CBC
HCT: 32.3 % — ABNORMAL LOW (ref 39.0–52.0)
HEMOGLOBIN: 10.3 g/dL — AB (ref 13.0–17.0)
MCH: 29 pg (ref 26.0–34.0)
MCHC: 31.9 g/dL (ref 30.0–36.0)
MCV: 91 fL (ref 78.0–100.0)
Platelets: 189 10*3/uL (ref 150–400)
RBC: 3.55 MIL/uL — AB (ref 4.22–5.81)
RDW: 13.9 % (ref 11.5–15.5)
WBC: 11.1 10*3/uL — ABNORMAL HIGH (ref 4.0–10.5)

## 2013-07-28 NOTE — Progress Notes (Signed)
PATIENT ID: Richard Bush  MRN: [id number]  DOB/AGE:  09-11-1950 / 63 y.o.  2 Days Post-Op Procedure(s) (LRB): TOTAL HIP ARTHROPLASTY (Right)    PROGRESS NOTE Subjective: Patient is alert, oriented, no Nausea, no Vomiting, yes passing gas, no Bowel Movement. Taking PO well. Denies SOB, Chest or Calf Pain. Using Incentive Spirometer, PAS in place. Ambulate WBAT Patient reports pain as 3 on 0-10 scale and 4 on 0-10 scale  .    Objective: Vital signs in last 24 hours: Filed Vitals:   07/28/13 0400 07/28/13 0630 07/28/13 0747 07/28/13 1156  BP:  136/76    Pulse:  93    Temp:  98.4 F (36.9 C)    TempSrc:  Oral    Resp: 16 16 18 18   Height:      Weight:      SpO2: 98% 92%        Intake/Output from previous day: I/O last 3 completed shifts: In: 1852.5 [P.O.:240; I.V.:1612.5] Out: [id number] [Urine:3425]   Intake/Output this shift: Total I/O In: 700 [P.O.:700] Out: 525 [Urine:525]   LABORATORY DATA:  Recent Labs  07/27/13 0610 07/28/13 0612  WBC 7.7 11.1*  HGB 10.1* 10.3*  HCT 32.5* 32.3*  PLT 182 189  NA 140  --   K 5.4*  --   CL 105  --   CO2 25  --   BUN 51*  --   CREATININE 3.42*  --   GLUCOSE 125*  --   CALCIUM 8.1*  --     Examination: Neurologically intact Neurovascular intact Sensation intact distally Intact pulses distally Dorsiflexion/Plantar flexion intact Incision: dressing C/D/I No cellulitis present Compartment soft} XR AP&Lat of hip shows well placed\fixed THA  Assessment:   2 Days Post-Op Procedure(s) (LRB): TOTAL HIP ARTHROPLASTY (Right) ADDITIONAL DIAGNOSIS:  Hypertension, Renal Insufficiency Chronic and morbid obesity. Pt was having difficulty with urination.  Pt was able to void this am on own  Plan: PT/OT WBAT, THA  posterior precautions  DVT Prophylaxis: SCDx72 hrs, ASA 325 mg BID x 2 weeks  DISCHARGE PLAN: Home when pt passes PT goals  DISCHARGE NEEDS: HHPT, HHRN, Walker and 3-in-1 comode seat

## 2013-07-28 NOTE — Discharge Summary (Signed)
Patient ID: Richard Bush MRN: [id number] DOB/AGE: 07-22-1950 63 y.o.  Admit date: 07/26/2013 Discharge date: 07/28/2013  Admission Diagnoses:  Active Problems:   Arthritis of right hip   Discharge Diagnoses:  Same  Past Medical History  Diagnosis Date  . Hypertension   . Chronic kidney disease   . GERD (gastroesophageal reflux disease)   . Arthritis   . Anemia   . Gout     Surgeries: Procedure(s): TOTAL HIP ARTHROPLASTY on 07/26/2013   Consultants:    Discharged Condition: Improved  Hospital Course: Richard Bush is an 63 y.o. male who was admitted 07/26/2013 for operative treatment of<principal problem not specified>. Patient has severe unremitting pain that affects sleep, daily activities, and work/hobbies. After pre-op clearance the patient was taken to the operating room on 07/26/2013 and underwent  Procedure(s): TOTAL HIP ARTHROPLASTY.    Patient was given perioperative antibiotics: Anti-infectives   Start     Dose/Rate Route Frequency Ordered Stop   07/26/13 0600  ceFAZolin (ANCEF) 3 g in dextrose 5 % 50 mL IVPB     3 g 160 mL/hr over 30 Minutes Intravenous On call to O.R. 07/25/13 1410 07/26/13 1256       Patient was given sequential compression devices, early ambulation, and chemoprophylaxis to prevent DVT.  Patient benefited maximally from hospital stay and there were no complications.    Recent vital signs: Patient Vitals for the past 24 hrs:  BP Temp Temp src Pulse Resp SpO2  07/28/13 1156 - - - - 18 -  07/28/13 0747 - - - - 18 -  07/28/13 0630 136/76 mmHg 98.4 F (36.9 C) Oral 93 16 92 %  07/28/13 0400 - - - - 16 98 %  07/28/13 0000 - - - - 16 96 %  07/27/13 2038 148/66 mmHg 98.3 F (36.8 C) Axillary 82 16 96 %  07/27/13 2000 - - - - 16 96 %  07/27/13 1641 134/74 mmHg 99.3 F (37.4 C) Oral 79 18 95 %     Recent laboratory studies:  Recent Labs  07/27/13 0610 07/28/13 0612  WBC 7.7 11.1*  HGB 10.1* 10.3*  HCT 32.5* 32.3*  PLT 182 189  NA 140  --    K 5.4*  --   CL 105  --   CO2 25  --   BUN 51*  --   CREATININE 3.42*  --   GLUCOSE 125*  --   CALCIUM 8.1*  --      Discharge Medications:     Medication List    STOP taking these medications       HYDROcodone-acetaminophen 5-325 MG per tablet  Commonly known as:  NORCO/VICODIN      TAKE these medications       acetaminophen 650 MG CR tablet  Commonly known as:  TYLENOL  Take 1,300 mg by mouth 2 (two) times daily.     aspirin EC 325 MG tablet  Take 1 tablet (325 mg total) by mouth 2 (two) times daily.     calcitRIOL 0.25 MCG capsule  Commonly known as:  ROCALTROL  Take 0.25 mcg by mouth every morning.     colchicine 0.6 MG tablet  Take 0.6 mg by mouth daily as needed (for gout flareups).     diclofenac sodium 1 % Gel  Commonly known as:  VOLTAREN  Apply 2 g topically daily as needed (pain).     febuxostat 40 MG tablet  Commonly known as:  ULORIC  Take 40 mg by  mouth every morning.     furosemide 80 MG tablet  Commonly known as:  LASIX  Take 80 mg by mouth 2 (two) times daily. Takes 80mg  at breakfast, and 80mg  at lunch, daily     gabapentin 100 MG capsule  Commonly known as:  NEURONTIN  Take 200 mg by mouth at bedtime.     losartan 25 MG tablet  Commonly known as:  COZAAR  Take 25 mg by mouth daily.     methocarbamol 500 MG tablet  Commonly known as:  ROBAXIN  Take 1 tablet (500 mg total) by mouth 2 (two) times daily with a meal.     omega-3 acid ethyl esters 1 G capsule  Commonly known as:  LOVAZA  Take 2 g by mouth 2 (two) times daily.     omeprazole 20 MG capsule  Commonly known as:  PRILOSEC  Take 20 mg by mouth daily.     oxyCODONE-acetaminophen 5-325 MG per tablet  Commonly known as:  ROXICET  Take 1 tablet by mouth every 4 (four) hours as needed.     simvastatin 20 MG tablet  Commonly known as:  ZOCOR  Take 20 mg by mouth every morning.     sodium bicarbonate 650 MG tablet  Take 1,300 mg by mouth 2 (two) times daily.     vitamin  C 500 MG tablet  Commonly known as:  ASCORBIC ACID  Take 500 mg by mouth every morning.     vitamin E 400 UNIT capsule  Take 400 Units by mouth daily.        Diagnostic Studies: Dg Chest 2 View  07/18/2013   CLINICAL DATA:  Preoperative evaluation.  History of hypertension.  EXAM: CHEST  2 VIEW  COMPARISON:  Chest radiograph 08/12/2012  FINDINGS: stable cardiac and mediastinal contours with mild tortuosity of the thoracic aorta. No consolidative pulmonary opacity. No pleural effusion or pneumothorax. Posttraumatic deformity lateral right lower rib.  IMPRESSION: No acute cardiopulmonary process.   Electronically Signed   By: Lovey Newcomer M.D.   On: 07/18/2013 13:34   Dg Pelvis Portable  07/26/2013   CLINICAL DATA:  Postop right hip arthroplasty  EXAM: PORTABLE PELVIS 1-2 VIEWS  COMPARISON:  None.  FINDINGS: Right hip prosthesis is well-seated and aligned on this single AP view. There is no acute fracture or evidence of an operative complication.  IMPRESSION: Well-aligned right hip prosthesis.   Electronically Signed   By: Lajean Manes M.D.   On: 07/26/2013 15:53    Disposition: 01-Home or Self Care      Discharge Instructions   Call MD / Call 911    Complete by:  As directed   If you experience chest pain or shortness of breath, CALL 911 and be transported to the hospital emergency room.  If you develope a fever above 101 F, pus (white drainage) or increased drainage or redness at the wound, or calf pain, call your surgeon's office.     Change dressing    Complete by:  As directed   You may change your dressing on day 5, then change the dressing daily with sterile 4 x 4 inch gauze dressing and paper tape.  You may clean the incision with alcohol prior to redressing     Constipation Prevention    Complete by:  As directed   Drink plenty of fluids.  Prune juice may be helpful.  You may use a stool softener, such as Colace (over the counter) 100 mg twice a  day.  Use MiraLax (over the counter)  for constipation as needed.     Diet - low sodium heart healthy    Complete by:  As directed      Discharge instructions    Complete by:  As directed   Follow up in office with Dr. Mayer Camel in 2 weeks.     Driving restrictions    Complete by:  As directed   No driving for 2 weeks     Follow the hip precautions as taught in Physical Therapy    Complete by:  As directed      Increase activity slowly as tolerated    Complete by:  As directed      Patient may shower    Complete by:  As directed   You may shower without a dressing once there is no drainage.  Do not wash over the wound.  If drainage remains, cover wound with plastic wrap and then shower.           Follow-up Information   Follow up with Kerin Salen, MD In 2 weeks.   Specialty:  Orthopedic Surgery   Contact information:   [place] [address] [phone]       Follow up with [facility]. (Someone from [facility] will contact you concerning start date and time for physical therapy.)    Contact information:   [place] [address] [phone]       Follow up with Kerin Salen, MD In 2 weeks.   Specialty:  Orthopedic Surgery   Contact information:   [place] [address] [phone]        Signed: Hardin Negus, Tommye Lehenbauer R 07/28/2013, 1:20 PM

## 2013-07-28 NOTE — Progress Notes (Signed)
Physical Therapy Treatment Patient Details Name: Richard Bush MRN: [id number] DOB: 1950/05/11 Today's Date: 07/28/2013    History of Present Illness 63 y.o. male s/p R THA with posterior approach.  Hx of HTN.    PT Comments    Patient able to complete step training this afternoon and is ready to DC home with assistance from his wife.   Follow Up Recommendations  Home health PT;Supervision/Assistance - 24 hour     Equipment Recommendations  None recommended by PT    Recommendations for Other Services       Precautions / Restrictions Precautions Precautions: Fall;Posterior Hip Precaution Comments: Patient able to recall all precautions this session Restrictions RLE Weight Bearing: Weight bearing as tolerated    Mobility  Bed Mobility               General bed mobility comments: Pt up in chair upon OT arrival.  Transfers Overall transfer level: Needs assistance Equipment used: Rolling walker (2 wheeled) Transfers: Sit to/from Stand Sit to Stand: Min guard         General transfer comment: Min guard for safety, VC for hand placement and precautions reminder  Ambulation/Gait Ambulation/Gait assistance: Supervision Ambulation Distance (Feet): 180 Feet Assistive device: Rolling walker (2 wheeled) Gait Pattern/deviations: Step-through pattern;Decreased step length - right;Decreased step length - left Gait velocity: decreased   General Gait Details: Cues to look forward   Stairs Stairs: Yes Stairs assistance: Min guard Stair Management: Step to pattern;Backwards;With walker Number of Stairs: 1 General stair comments: Patient educated on technique and sequency  Wheelchair Mobility    Modified Rankin (Stroke Patients Only)       Balance           Standing balance support: During functional activity;Single extremity supported Standing balance-Leahy Scale: Fair                      Cognition Arousal/Alertness: Awake/alert Behavior During  Therapy: WFL for tasks assessed/performed Overall Cognitive Status: Within Functional Limits for tasks assessed       Memory: Decreased recall of precautions              Exercises Total Joint Exercises Quad Sets: AROM;Right;10 reps;Seated Gluteal Sets: AROM;Both;10 reps Heel Slides: AAROM;Right;10 reps Hip ABduction/ADduction: AAROM;Right;10 reps Long Arc Quad: AAROM;Right;10 reps    General Comments        Pertinent Vitals/Pain no apparent distress     Home Living                      Prior Function            PT Goals (current goals can now be found in the care plan section) Acute Rehab PT Goals Patient Stated Goal: Return home Progress towards PT goals: Progressing toward goals    Frequency  7X/week    PT Plan Current plan remains appropriate    Co-evaluation             End of Session Equipment Utilized During Treatment: Gait belt Activity Tolerance: Patient tolerated treatment well Patient left: in chair;with call bell/phone within reach;with family/visitor present     Time: [id number] PT Time Calculation (min): 26 min  Charges:  $Gait Training: 23-37 mins $Therapeutic Exercise: 8-22 mins                    G Codes:      Jacqualyn Posey 07/28/2013, 2:12 PM 07/28/2013 Jacqualyn Posey PTA [phone]  pager [phone] office

## 2013-07-28 NOTE — Progress Notes (Signed)
Physical Therapy Treatment Patient Details Name: Richard Bush MRN: [id number] DOB: 06-11-1950 Today's Date: 07/28/2013    History of Present Illness 63 y.o. male s/p R THA with posterior approach.  Hx of HTN.    PT Comments    Patient progressing with goals. Will attempt steps later today and patient possibly could go home if goals met. Patient and wife are eager to return home if patient keeps progressing  Follow Up Recommendations  Home health PT;Supervision/Assistance - 24 hour     Equipment Recommendations  None recommended by PT    Recommendations for Other Services       Precautions / Restrictions Precautions Precautions: Fall;Posterior Hip Precaution Comments: Patient able to recall all precautions Restrictions RLE Weight Bearing: Weight bearing as tolerated    Mobility  Bed Mobility                  Transfers Overall transfer level: Needs assistance Equipment used: Rolling walker (2 wheeled) Transfers: Sit to/from Stand Sit to Stand: Min guard         General transfer comment: Min guard for safety, VC for hand placement and precautions reminder  Ambulation/Gait Ambulation/Gait assistance: Supervision Ambulation Distance (Feet): 200 Feet Assistive device: Rolling walker (2 wheeled) Gait Pattern/deviations: Step-through pattern;Decreased step length - right;Decreased step length - left Gait velocity: decreased   General Gait Details: Adjusted RW height to assist with better posture.    Stairs            Wheelchair Mobility    Modified Rankin (Stroke Patients Only)       Balance                                    Cognition Arousal/Alertness: Awake/alert Behavior During Therapy: WFL for tasks assessed/performed Overall Cognitive Status: Within Functional Limits for tasks assessed                      Exercises Total Joint Exercises Quad Sets: AROM;Right;10 reps;Seated Gluteal Sets: AROM;Both;10 reps Heel  Slides: AAROM;Right;10 reps Hip ABduction/ADduction: AAROM;Right;10 reps Long Arc Quad: AAROM;Right;10 reps    General Comments        Pertinent Vitals/Pain no apparent distress     Home Living                      Prior Function            PT Goals (current goals can now be found in the care plan section) Progress towards PT goals: Progressing toward goals    Frequency  7X/week    PT Plan Current plan remains appropriate    Co-evaluation             End of Session Equipment Utilized During Treatment: Gait belt Activity Tolerance: Patient tolerated treatment well Patient left: in chair;with call bell/phone within reach;with family/visitor present     Time: 3748-2707 PT Time Calculation (min): 45 min  Charges:  $Gait Training: 23-37 mins $Therapeutic Exercise: 8-22 mins                    G Codes:      Jacqualyn Posey 07/28/2013, 11:25 AM 07/28/2013 Jacqualyn Posey PTA [phone] pager [phone] office

## 2013-07-28 NOTE — Progress Notes (Signed)
I have read and agree with this note.   Time in/out:10:15-10:38 Total time:23 minutes ([place])  [address] [address], OTR/L [phone]

## 2013-07-28 NOTE — Progress Notes (Signed)
Occupational Therapy Treatment and Discharge Patient Details Name: Richard Bush MRN: [id number] DOB: [birth year] Today's Date: 07/28/2013    History of present illness 63 y.o. male s/p R THA with posterior approach.  Hx of HTN.   OT comments  Focus of treatment was on practicing a walk-in shower transfer using the [place] and bariatric 3in1. Educated pt on proper technique and reviewed precautions with pt and wife. Pt is progressing towards goals and will have assistance from wife at home for ADLs. No further OT is needed, we will sign off.  Follow Up Recommendations  Supervision/Assistance - 24 hour          Precautions / Restrictions Precautions Precautions: Fall;Posterior Hip Precaution Comments: Patient able to recall 2/3 precautions, which were reviewed with pt and wife Restrictions RLE Weight Bearing: Weight bearing as tolerated       Mobility Bed Mobility               General bed mobility comments: Pt up in chair upon OT arrival.  Transfers Overall transfer level: Needs assistance Equipment used: Rolling walker (2 wheeled) Transfers: Sit to/from Stand Sit to Stand: Min guard         General transfer comment: Min guard for safety, VC for hand placement and precautions reminder    Balance           Standing balance support: During functional activity;Single extremity supported Standing balance-Leahy Scale: Fair                     ADL Overall ADL's : Needs assistance/impaired                                 Tub/ Shower Transfer: Walk-in shower;Cueing for sequencing;3 in 1;Ambulation;Rolling walker;Min guard   Functional mobility during ADLs: Min guard General ADL Comments: Educated and practiced shower stall transfer with pt and wife. Pt will be using a bariatric 3in1 for transfer and backing up to the stall. His wife will assist him if needed and they may continue doing a sponge bath until functional mobility increases.               Cognition   Behavior During Therapy: WFL for tasks assessed/performed Overall Cognitive Status: Within Functional Limits for tasks assessed       Memory: Decreased recall of precautions                            Pertinent Vitals/ Pain       Pt c/o pain as "about a 30" during transfer but after tx, pt was ok. Pt was positioned in chair with ice applied to hip.          Frequency Min 2X/week     Progress Toward Goals  OT Goals(current goals can now be found in the care plan section)  Progress towards OT goals: Progressing toward goals  Acute Rehab OT Goals Patient Stated Goal: Return home OT Goal Formulation: With patient/family Time For Goal Achievement: 08/03/13 Potential to Achieve Goals: Good  Plan Discharge plan remains appropriate       End of Session Equipment Utilized During Treatment: Gait belt;Rolling walker   Activity Tolerance Patient tolerated treatment well   Patient Left in chair;with call bell/phone within reach;with family/visitor present           Time: [id number] OT Time Calculation (min): 23 min  Charges: OT General Charges $OT Visit: 1 Procedure OT Treatments $Self Care/Home Management : 23-37 mins  Lyda Perone 07/28/2013, 12:13 PM

## 2013-09-01 NOTE — OR Nursing (Signed)
Late entry for subtype and infection under procedural recording

## 2013-09-01 NOTE — OR Nursing (Signed)
Late entry for wound class under procedural recordings

## 2014-03-15 ENCOUNTER — Other Ambulatory Visit (HOSPITAL_COMMUNITY): Payer: Self-pay

## 2014-03-16 ENCOUNTER — Ambulatory Visit (HOSPITAL_COMMUNITY)
Admission: RE | Admit: 2014-03-16 | Discharge: 2014-03-16 | Disposition: A | Discharge status: Home / Self Care | Payer: Medicare Other | Source: Ambulatory Visit | Attending: Nephrology | Admitting: Nephrology

## 2014-03-16 DIAGNOSIS — D509 Iron deficiency anemia, unspecified: Secondary | ICD-10-CM | POA: Insufficient documentation

## 2014-03-16 MED ORDER — SODIUM CHLORIDE 0.9 % IV SOLN
1020.0000 mg | Freq: Once | INTRAVENOUS | Status: AC
  Administered 2014-03-16: 1020 mg via INTRAVENOUS
  Filled 2014-03-16: qty 34

## 2014-03-16 NOTE — Discharge Instructions (Signed)

## 2014-03-19 ENCOUNTER — Encounter (HOSPITAL_COMMUNITY): Payer: Medicare Other

## 2014-08-31 ENCOUNTER — Other Ambulatory Visit (HOSPITAL_COMMUNITY): Payer: Self-pay | Admitting: Orthopedic Surgery

## 2014-08-31 DIAGNOSIS — M25559 Pain in unspecified hip: Secondary | ICD-10-CM

## 2014-09-07 ENCOUNTER — Encounter (HOSPITAL_COMMUNITY)
Admission: RE | Admit: 2014-09-07 | Discharge: 2014-09-07 | Disposition: A | Discharge status: Home / Self Care | Payer: Medicare Other | Source: Ambulatory Visit | Attending: Orthopedic Surgery | Admitting: Orthopedic Surgery

## 2014-09-07 DIAGNOSIS — M25559 Pain in unspecified hip: Secondary | ICD-10-CM

## 2014-09-07 MED ORDER — TECHNETIUM TC 99M MEDRONATE IV KIT
27.4000 | PACK | Freq: Once | INTRAVENOUS | Status: AC | PRN
  Administered 2014-09-07: 27.4 via INTRAVENOUS

## 2015-12-26 ENCOUNTER — Encounter: Payer: Self-pay | Admitting: Rheumatology

## 2016-01-01 ENCOUNTER — Telehealth: Payer: Self-pay | Admitting: Rheumatology

## 2016-01-01 DIAGNOSIS — Z79899 Other long term (current) drug therapy: Secondary | ICD-10-CM

## 2016-01-01 NOTE — Telephone Encounter (Signed)
Elizabeth and [employer] in [place] needs a copy of patient's standing lab order faxed over to them please. Fax# [phone]

## 2016-01-01 NOTE — Telephone Encounter (Signed)
faxed

## 2016-01-16 ENCOUNTER — Encounter: Payer: Self-pay | Admitting: Rheumatology

## 2016-01-16 ENCOUNTER — Ambulatory Visit (INDEPENDENT_AMBULATORY_CARE_PROVIDER_SITE_OTHER): Payer: Medicare Other | Admitting: Rheumatology

## 2016-01-16 VITALS — BP 166/78 | HR 77 | Resp 14 | Wt 344.0 lb

## 2016-01-16 DIAGNOSIS — M179 Osteoarthritis of knee, unspecified: Secondary | ICD-10-CM

## 2016-01-16 DIAGNOSIS — M19049 Primary osteoarthritis, unspecified hand: Secondary | ICD-10-CM

## 2016-01-16 DIAGNOSIS — M1A00X0 Idiopathic chronic gout, unspecified site, without tophus (tophi): Secondary | ICD-10-CM

## 2016-01-16 DIAGNOSIS — M1710 Unilateral primary osteoarthritis, unspecified knee: Secondary | ICD-10-CM | POA: Diagnosis not present

## 2016-01-16 DIAGNOSIS — N184 Chronic kidney disease, stage 4 (severe): Secondary | ICD-10-CM | POA: Diagnosis not present

## 2016-01-16 DIAGNOSIS — I10 Essential (primary) hypertension: Secondary | ICD-10-CM

## 2016-01-16 MED ORDER — COLCHICINE 0.6 MG PO TABS: 0.6000 mg | ORAL_TABLET | Freq: Every day | ORAL | 2 refills | Status: DC | PRN

## 2016-01-16 MED ORDER — FEBUXOSTAT 40 MG PO TABS: 40.0000 mg | ORAL_TABLET | Freq: Every day | ORAL | 1 refills | Status: DC

## 2016-01-16 NOTE — Progress Notes (Signed)
Office Visit Note  Patient: Richard Bush             Date of Birth: 04-14-1950           MRN: [id number]             PCP: Quentin Cornwall, MD Referring: Quentin Cornwall, MD Visit Date: 01/16/2016 Occupation: @GUAROCC @    Subjective:  Follow-up F-U GOUT, OBESITY, HAND, FEET PAIN  History of Present Illness: Venkat Ankney is a 65 y.o. male  F-U GOUT ON ULORIC 40MG  DAILY COLCRYS when necessary NO FLARE  We'll see his nephrologist tomorrow his kidneys as well as his elevated blood pressure   LABS:  12/25/15  shows uric acid at 6.1. He's done labs this Monday but we do not have a copy of those labs yet from [facility]. They are faxing it to our office  Activities of Daily Living:  Patient reports morning stiffness for 15 minutes.   Patient Denies nocturnal pain.  Difficulty dressing/grooming: Reports Difficulty climbing stairs: Reports Difficulty getting out of chair: Reports Difficulty using hands for taps, buttons, cutlery, and/or writing: Denies   Review of Systems  Constitutional: Negative for fatigue.  HENT: Negative for mouth sores and mouth dryness.   Eyes: Negative for dryness.  Respiratory: Negative for shortness of breath.   Gastrointestinal: Negative for constipation and diarrhea.  Musculoskeletal: Negative for myalgias and myalgias.  Skin: Negative for sensitivity to sunlight.  Neurological: Negative for memory loss.  Psychiatric/Behavioral: Negative for sleep disturbance.    PMFS History:  Patient Active Problem List   Diagnosis Date Noted  . Arthritis of right hip 07/26/2013    Past Medical History:  Diagnosis Date  . Anemia   . Arthritis   . Chronic kidney disease   . GERD (gastroesophageal reflux disease)   . Gout   . Hypertension     Family History  Problem Relation Age of Onset  . Heart disease Mother   . Cancer Father    Past Surgical History:  Procedure Laterality Date  . APPENDECTOMY  1963  . CARPAL TUNNEL RELEASE Bilateral 2006 right and  2007 left   . CHOLECYSTECTOMY  1979  . FOOT SURGERY Left 1981  . JOINT REPLACEMENT Right 2008   . JOINT REPLACEMENT Left 2010  . LUMBAR LAMINECTOMY/DECOMPRESSION MICRODISCECTOMY N/A 08/18/2012   Procedure: LUMBAR LAMINECTOMY/DECOMPRESSION MICRODISCECTOMY 3 LEVELS;  Surgeon: Hosie Spangle, MD;  Location: [facility];  Service: Neurosurgery;  Laterality: N/A;  Lumbar Two through Five Laminectomy  . SHOULDER ARTHROSCOPY WITH ROTATOR CUFF REPAIR Right 2001  . TOTAL HIP ARTHROPLASTY Right 07/26/2013   Procedure: TOTAL HIP ARTHROPLASTY;  Surgeon: Kerin Salen, MD;  Location: [facility];  Service: Orthopedics;  Laterality: Right;  . WRIST TENDON TRANSFER Bilateral 1972 left and 1973 right   Social History   Social History Narrative  . No narrative on file     Objective: Vital Signs: BP (!) 166/78 (BP Location: Left Arm, Patient Position: Sitting, Cuff Size: Large)   Pulse 77   Resp 14   Wt (!) 344 lb (156 kg)   BMI 47.98 kg/m    Physical Exam  Constitutional: He is oriented to person, place, and time. He appears well-developed and well-nourished.  HENT:  Head: Normocephalic and atraumatic.  Eyes: Conjunctivae and EOM are normal. Pupils are equal, round, and reactive to light.  Neck: Normal range of motion. Neck supple.  Cardiovascular: Normal rate, regular rhythm and normal heart sounds.  Exam reveals no  gallop and no friction rub.   No murmur heard. Pulmonary/Chest: Effort normal and breath sounds normal. No respiratory distress. He has no wheezes. He has no rales. He exhibits no tenderness.  Abdominal: Soft. He exhibits no distension and no mass. There is no tenderness. There is no guarding.  Musculoskeletal: Normal range of motion.  Lymphadenopathy:    He has no cervical adenopathy.  Neurological: He is alert and oriented to person, place, and time. He exhibits normal muscle tone. Coordination normal.  Skin: Skin is warm and dry. Capillary refill takes less than 2 seconds. No rash  noted.  Psychiatric: He has a normal mood and affect. His behavior is normal. Judgment and thought content normal.  Nursing note and vitals reviewed.    Musculoskeletal Exam:  Full range of motion of all joints Grip strength is equal and strong bilaterally Fiber myalgia tender points are all absent  CDAI Exam: No CDAI exam completed.  No synovitis on examination  Investigation: No additional findings. Patient gets labs from [place] and on occasion Dr. [name] office and on occasion Dr. Daralene Milch office. The last labs that we have for the patient is dated 07/22/2015 from Dr. nephrologist office his uric acid was 7.3 at that time CBC with differential was within normal limits CMP with GFR was within normal limits except creatinine was 4.15 consistent with his chronic kidney disease GFR was 14 consistent with his chronic kidney disease for which he sees a nephrologist. Urinalysis showed 2+ protein Phosphorus was normal at 3.6  Patient had recent labs done this past Monday at [facility]. Were waiting for those results. Patient did ask them to fax it to [place]. I do have access to his most recent November 8 uric acid levels which is a uric acid level of 6.1. This is a desirable range. Will discuss with the patient the importance of trying to bring it down to a 5. However with his chronic kidney disease we don't want to increase to uloric get this time. So for the moment this is acceptable because we want to keep the patient from having too much U Lorick in his system. He has not had any flares since the last visit.   Imaging: No results found.  Speciality Comments: No specialty comments available.    Procedures:  No procedures performed Allergies: Other and Sulfa antibiotics   Assessment / Plan:     Visit Diagnoses: Idiopathic chronic gout without tophus, unspecified site  Osteoarthritis of knee, unspecified laterality, unspecified osteoarthritis type  Stage 4 chronic kidney disease  (HCC)  Osteoarthritis of hand, unspecified laterality, unspecified osteoarthritis type  Hypertension, unspecified type   In the past patient used to have flares of gout in his left big toe and arch of his foot and on occasion on the right foot. It is been years since he's had a flare. He is keeping the gout will control. He Lorick 40 mg daily is working well for the patient. Note that the chronic kidney disease is preventing [place] from giving higher dose of U Lorick at this time and at this time since he is not having any flares he is going to maintain the 40 mg of U Lorick.  He will see . nephrologist tomorrow for his regular follow-up with his chronic kidney disease stage IV.   I will refill his Uloric I will also print out a copy of this for him to use when needed. So far he has not had to use the last prescription and the  medicine is expired. Therefore he may do well with a new fresh: Gerald Stabs in case he needs it for the future but I'll give him a printed prescription and he can get it filled if and when he has a flare Orders: No orders of the defined types were placed in this encounter.  Meds ordered this encounter  Medications  . colchicine 0.6 MG tablet    Sig: Take 1 tablet (0.6 mg total) by mouth daily as needed (for gout flareups).    Dispense:  30 tablet    Refill:  2    Order Specific Question:   Supervising Provider    Answer:   Bo Merino [[id number]]  . febuxostat (ULORIC) 40 MG tablet    Sig: Take 1 tablet (40 mg total) by mouth daily.    Dispense:  90 tablet    Refill:  1    Order Specific Question:   Supervising Provider    Answer:   Bo Merino [phone]    Face-to-face time spent with patient was 30 minutes. 50% of time was spent in counseling and coordination of care.  Follow-Up Instructions: Return in about 6 months (around 07/15/2016) for gout, oa hands & Knees, ckd, obesity.   Eliezer Lofts, PA-C   I examined and evaluated the patient with Eliezer Lofts  PA. The plan of care was discussed as noted above.  Bo Merino, MD

## 2016-01-24 ENCOUNTER — Ambulatory Visit: Payer: Self-pay | Admitting: Rheumatology

## 2016-01-30 ENCOUNTER — Telehealth: Payer: Self-pay | Admitting: Radiology

## 2016-01-30 NOTE — Telephone Encounter (Signed)
Note received from [place] [facility] patients kidney function has declined, he should decrease Uloric to every other day, and discuss with nephrologist if it is safe for him to continue this medication / per Dr Estanislado Pandy, will you call him please

## 2016-01-31 NOTE — Telephone Encounter (Signed)
Patient has been advised to decrease Uloric to every other day. Patient also advised to discuss with nephrologist if it is safe for him to continue the Uloric. Patient verbalized understanding.

## 2016-07-03 DIAGNOSIS — Z96653 Presence of artificial knee joint, bilateral: Secondary | ICD-10-CM | POA: Insufficient documentation

## 2016-07-03 DIAGNOSIS — M1712 Unilateral primary osteoarthritis, left knee: Secondary | ICD-10-CM | POA: Insufficient documentation

## 2016-07-03 DIAGNOSIS — N2581 Secondary hyperparathyroidism of renal origin: Secondary | ICD-10-CM | POA: Insufficient documentation

## 2016-07-03 DIAGNOSIS — M19071 Primary osteoarthritis, right ankle and foot: Secondary | ICD-10-CM | POA: Insufficient documentation

## 2016-07-03 DIAGNOSIS — M1A00X0 Idiopathic chronic gout, unspecified site, without tophus (tophi): Secondary | ICD-10-CM | POA: Insufficient documentation

## 2016-07-03 DIAGNOSIS — M19049 Primary osteoarthritis, unspecified hand: Secondary | ICD-10-CM | POA: Insufficient documentation

## 2016-07-03 DIAGNOSIS — Z96641 Presence of right artificial hip joint: Secondary | ICD-10-CM | POA: Insufficient documentation

## 2016-07-03 DIAGNOSIS — N184 Chronic kidney disease, stage 4 (severe): Secondary | ICD-10-CM | POA: Insufficient documentation

## 2016-07-03 DIAGNOSIS — I10 Essential (primary) hypertension: Secondary | ICD-10-CM | POA: Insufficient documentation

## 2016-07-03 DIAGNOSIS — M19072 Primary osteoarthritis, left ankle and foot: Secondary | ICD-10-CM

## 2016-07-03 NOTE — Progress Notes (Signed)
Office Visit Note  Patient: Richard Bush             Date of Birth: [birth year]           MRN: [id number]             PCP: Quentin Cornwall, MD Referring: Quentin Cornwall, MD Visit Date: 07/15/2016 Occupation: _0 @    Subjective:  Pain left foot   History of Present Illness: Richard Bush is a 66 y.o. male with history of gouty arthropathy. According to him about 2 weeks ago he started having pain and swelling in his right thumb it's gradually improved. Left ankle pain has been there for last 1-1/2 week now. He is also having swelling on top of his foot and difficulty walking. He took colchicine but His diarrhea worse. He's been taking Uloric 40 mg every other day . He's been having chronic diarrhea since cholecystectomy. He is been numb taking Imodium on regular basis. He recently had colonoscopy and had polyps removed. He was placed on cholestyramine after that. Colace time and cause increased nausea. And he discontinued the medication. He was also advised to stop the omeprazole.  Activities of Daily Living:  Patient reports morning stiffness for 3  hours.   Patient Reports nocturnal pain.  Difficulty dressing/grooming: Reports Difficulty climbing stairs: Reports Difficulty getting out of chair: Reports Difficulty using hands for taps, buttons, cutlery, and/or writing: Denies   Review of Systems  Constitutional: Positive for fatigue. Negative for night sweats and weakness ( ).  HENT: Negative for mouth sores, mouth dryness and nose dryness.   Eyes: Negative for redness and dryness.  Respiratory: Negative for shortness of breath and difficulty breathing.   Cardiovascular: Negative for chest pain, palpitations, hypertension, irregular heartbeat and swelling in legs/feet.  Gastrointestinal: Positive for diarrhea. Negative for constipation.  Endocrine: Negative for increased urination.  Musculoskeletal: Positive for arthralgias, joint pain and joint swelling. Negative for myalgias,  muscle weakness, morning stiffness, muscle tenderness and myalgias.  Skin: Negative for color change, rash, hair loss, nodules/bumps, skin tightness, ulcers and sensitivity to sunlight.  Allergic/Immunologic: Negative for susceptible to infections.  Neurological: Negative for dizziness, fainting, memory loss and night sweats.  Hematological: Negative for swollen glands.  Psychiatric/Behavioral: Negative for depressed mood and sleep disturbance. The patient is not nervous/anxious.     PMFS History:  Patient Active Problem List   Diagnosis Date Noted  . History of gastroesophageal reflux (GERD) 07/13/2016  . Idiopathic chronic gout without tophus 07/03/2016  . Primary osteoarthritis of left knee 07/03/2016  . Stage 4 chronic kidney disease ([place]) 07/03/2016  . Osteoarthritis of hand 07/03/2016  . Hypertension 07/03/2016  . Hyperparathyroidism ([place]) 07/03/2016  . Primary osteoarthritis of both feet 07/03/2016  . History of total hip replacement, right 07/03/2016  . History of total knee arthroplasty, right 07/03/2016    Past Medical History:  Diagnosis Date  . Anemia   . Arthritis   . Chronic kidney disease   . GERD (gastroesophageal reflux disease)   . Gout   . Hypertension     Family History  Problem Relation Age of Onset  . Heart disease Mother   . Cancer Father    Past Surgical History:  Procedure Laterality Date  . APPENDECTOMY  1963  . CARPAL TUNNEL RELEASE Bilateral 2006 right and 2007 left   . CHOLECYSTECTOMY  1979  . FOOT SURGERY Left 1981  . JOINT REPLACEMENT Right 2008   . JOINT REPLACEMENT Left 2010  . LUMBAR  LAMINECTOMY/DECOMPRESSION MICRODISCECTOMY N/A 08/18/2012   Procedure: LUMBAR LAMINECTOMY/DECOMPRESSION MICRODISCECTOMY 3 LEVELS;  Surgeon: Hosie Spangle, MD;  Location: [facility];  Service: Neurosurgery;  Laterality: N/A;  Lumbar Two through Five Laminectomy  . SHOULDER ARTHROSCOPY WITH ROTATOR CUFF REPAIR Right 2001  . TOTAL HIP ARTHROPLASTY Right  07/26/2013   Procedure: TOTAL HIP ARTHROPLASTY;  Surgeon: Kerin Salen, MD;  Location: [facility];  Service: Orthopedics;  Laterality: Right;  . WRIST TENDON TRANSFER Bilateral 1972 left and 1973 right   Social History   Social History Narrative  . No narrative on file     Objective: Vital Signs: BP 138/68   Pulse 82   Resp 14   Wt (!) 348 lb (157.9 kg)   BMI 48.54 kg/m    Physical Exam  Constitutional: He is oriented to person, place, and time. He appears well-developed and well-nourished.  HENT:  Head: Normocephalic and atraumatic.  Eyes: Conjunctivae and EOM are normal. Pupils are equal, round, and reactive to light.  Neck: Normal range of motion. Neck supple.  Cardiovascular: Normal rate, regular rhythm and normal heart sounds.   Pulmonary/Chest: Effort normal and breath sounds normal.  Abdominal: Soft. Bowel sounds are normal.  Neurological: He is alert and oriented to person, place, and time.  Skin: Skin is warm and dry. Capillary refill takes less than 2 seconds.  Psychiatric: He has a normal mood and affect. His behavior is normal.  Nursing note and vitals reviewed.    Musculoskeletal Exam: C-spine and thoracic lumbar spine good range of motion. Shoulder joints elbow joints wrist joints MCPs PIPs DIPs are good range of motion. He had no tenderness on palpation over his hands today hip joints knee joints are good range of motion. He has some tenderness over the lateral aspect of his left foot but no warmth swelling or effusion was noted.  CDAI Exam: No CDAI exam completed.    Investigation: No additional findings.  Imaging: No results found.  Speciality Comments: No specialty comments available.  11/17 Hb 11.7, GFR 15, Uric acid  5.5  05/11/2016 CBC hemoglobin 11.5, CMP creatinine 5.28 GFR 10, UA 3+ protein, uric acid 6.7, phosphorus 5.6  Procedures:  No procedures performed Allergies: Other and Sulfa antibiotics   Assessment / Plan:     Visit Diagnoses:  Idiopathic chronic gout of multiple sites without tophus - Uloric 40 mg by mouth every other day, colchicine 0.6 mg 1/2 tab po q od prn. He had recent flare with thumb pain in his right thumb in his left foot. It's at the tail end right now. We'll check his labs today  Primary osteoarthritis of both hands: Chronic pain  History of total hip replacement, right: Doing fairly well  Primary osteoarthritis of left knee: Chronic discomfort   Primary osteoarthritis of both feet  History of chronic kidney disease - Stage 4 , GFR 10, fu Dr Azzie Roup  History of hypertension  History of gastroesophageal reflux (GERD)  Hyperparathyroidism ([place])  Medication monitoring encounter - Plan: CBC with Differential/Platelet, CMP14+EGFR, Uric acid    Orders: Orders Placed This Encounter  Procedures  . CBC with Differential/Platelet  . CMP14+EGFR  . Uric acid   Meds ordered this encounter  Medications  . colchicine 0.6 MG tablet    Sig: Take 1 tablet (0.6 mg total) by mouth daily as needed (for gout flareups).    Dispense:  90 tablet    Refill:  0    Face-to-face time spent with patient was 30 minutes.  50% of time was spent in counseling and coordination of care.  Follow-Up Instructions: Return in about 4 months (around 11/15/2016) for Gout.   Bo Merino, MD  Note - This record has been created using Editor, commissioning.  Chart creation errors have been sought, but may not always  have been located. Such creation errors do not reflect on  the standard of medical care.

## 2016-07-09 ENCOUNTER — Other Ambulatory Visit: Payer: Self-pay | Admitting: *Deleted

## 2016-07-09 DIAGNOSIS — Z79899 Other long term (current) drug therapy: Secondary | ICD-10-CM

## 2016-07-09 NOTE — Progress Notes (Signed)
Patient at lab in [facility] in [place]. Called to ask for lab. Orders. Orders placed, released and faxed. [phone]

## 2016-07-13 DIAGNOSIS — Z8719 Personal history of other diseases of the digestive system: Secondary | ICD-10-CM | POA: Insufficient documentation

## 2016-07-15 ENCOUNTER — Ambulatory Visit (INDEPENDENT_AMBULATORY_CARE_PROVIDER_SITE_OTHER): Payer: Medicare Other | Admitting: Rheumatology

## 2016-07-15 ENCOUNTER — Encounter (INDEPENDENT_AMBULATORY_CARE_PROVIDER_SITE_OTHER): Payer: Self-pay

## 2016-07-15 ENCOUNTER — Encounter: Payer: Self-pay | Admitting: Rheumatology

## 2016-07-15 VITALS — BP 138/68 | HR 82 | Resp 14 | Wt 348.0 lb

## 2016-07-15 DIAGNOSIS — M19042 Primary osteoarthritis, left hand: Secondary | ICD-10-CM | POA: Diagnosis not present

## 2016-07-15 DIAGNOSIS — Z87448 Personal history of other diseases of urinary system: Secondary | ICD-10-CM

## 2016-07-15 DIAGNOSIS — Z8719 Personal history of other diseases of the digestive system: Secondary | ICD-10-CM

## 2016-07-15 DIAGNOSIS — M19071 Primary osteoarthritis, right ankle and foot: Secondary | ICD-10-CM | POA: Diagnosis not present

## 2016-07-15 DIAGNOSIS — M19072 Primary osteoarthritis, left ankle and foot: Secondary | ICD-10-CM

## 2016-07-15 DIAGNOSIS — E213 Hyperparathyroidism, unspecified: Secondary | ICD-10-CM

## 2016-07-15 DIAGNOSIS — Z8679 Personal history of other diseases of the circulatory system: Secondary | ICD-10-CM | POA: Diagnosis not present

## 2016-07-15 DIAGNOSIS — M1712 Unilateral primary osteoarthritis, left knee: Secondary | ICD-10-CM

## 2016-07-15 DIAGNOSIS — Z79899 Other long term (current) drug therapy: Secondary | ICD-10-CM | POA: Diagnosis not present

## 2016-07-15 DIAGNOSIS — M1A09X0 Idiopathic chronic gout, multiple sites, without tophus (tophi): Secondary | ICD-10-CM | POA: Diagnosis not present

## 2016-07-15 DIAGNOSIS — Z96641 Presence of right artificial hip joint: Secondary | ICD-10-CM

## 2016-07-15 DIAGNOSIS — M19041 Primary osteoarthritis, right hand: Secondary | ICD-10-CM | POA: Diagnosis not present

## 2016-07-15 DIAGNOSIS — Z5181 Encounter for therapeutic drug level monitoring: Secondary | ICD-10-CM

## 2016-07-15 MED ORDER — COLCHICINE 0.6 MG PO TABS: 0.6000 mg | ORAL_TABLET | Freq: Every day | ORAL | 0 refills | Status: DC | PRN

## 2016-07-15 NOTE — Patient Instructions (Signed)
When you need the Colchicine for flare only use one half tablet daily, due to declined kidney function

## 2016-07-16 LAB — CMP14+EGFR
ALT: 17 IU/L (ref 0–44)
AST: 13 IU/L (ref 0–40)
Albumin/Globulin Ratio: 1.4 (ref 1.2–2.2)
Albumin: 4.1 g/dL (ref 3.6–4.8)
Alkaline Phosphatase: 57 IU/L (ref 39–117)
BILIRUBIN TOTAL: 0.3 mg/dL (ref 0.0–1.2)
BUN/Creatinine Ratio: 14 (ref 10–24)
BUN: 92 mg/dL (ref 8–27)
CHLORIDE: 109 mmol/L — AB (ref 96–106)
CO2: 15 mmol/L — ABNORMAL LOW (ref 18–29)
Calcium: 8.4 mg/dL — ABNORMAL LOW (ref 8.6–10.2)
Creatinine, Ser: 6.78 mg/dL — ABNORMAL HIGH (ref 0.76–1.27)
GFR calc Af Amer: 9 mL/min/{1.73_m2} — ABNORMAL LOW (ref >59)
GFR, EST NON AFRICAN AMERICAN: 8 mL/min/{1.73_m2} — AB (ref >59)
GLOBULIN, TOTAL: 2.9 g/dL (ref 1.5–4.5)
Glucose: 83 mg/dL (ref 65–99)
Potassium: 4.6 mmol/L (ref 3.5–5.2)
SODIUM: 145 mmol/L — AB (ref 134–144)
Total Protein: 7 g/dL (ref 6.0–8.5)

## 2016-07-16 LAB — CBC WITH DIFFERENTIAL/PLATELET
BASOS: 0 %
Basophils Absolute: 0 10*3/uL (ref 0.0–0.2)
EOS (ABSOLUTE): 0.3 10*3/uL (ref 0.0–0.4)
EOS: 5 %
HEMATOCRIT: 31.1 % — AB (ref 37.5–51.0)
Hemoglobin: 9.8 g/dL — ABNORMAL LOW (ref 13.0–17.7)
IMMATURE GRANULOCYTES: 0 %
Immature Grans (Abs): 0 10*3/uL (ref 0.0–0.1)
LYMPHS ABS: 1.7 10*3/uL (ref 0.7–3.1)
Lymphs: 26 %
MCH: 30.2 pg (ref 26.6–33.0)
MCHC: 31.5 g/dL (ref 31.5–35.7)
MCV: 96 fL (ref 79–97)
MONOS ABS: 0.4 10*3/uL (ref 0.1–0.9)
Monocytes: 6 %
NEUTROS ABS: 4.2 10*3/uL (ref 1.4–7.0)
Neutrophils: 63 %
Platelets: 186 10*3/uL (ref 150–379)
RBC: 3.24 x10E6/uL — ABNORMAL LOW (ref 4.14–5.80)
RDW: 15.1 % (ref 12.3–15.4)
WBC: 6.6 10*3/uL (ref 3.4–10.8)

## 2016-07-16 LAB — URIC ACID: Uric Acid: 7.7 mg/dL (ref 3.7–8.6)

## 2016-07-16 NOTE — Progress Notes (Signed)
Please, notify Pt and him call his nephrologist. Fax results to Nephrology.

## 2016-07-30 ENCOUNTER — Other Ambulatory Visit: Payer: Self-pay

## 2016-07-30 DIAGNOSIS — Z01810 Encounter for preprocedural cardiovascular examination: Secondary | ICD-10-CM

## 2016-07-30 DIAGNOSIS — N185 Chronic kidney disease, stage 5: Secondary | ICD-10-CM

## 2016-08-03 ENCOUNTER — Encounter (HOSPITAL_COMMUNITY): Payer: Medicare Other

## 2016-08-03 ENCOUNTER — Encounter: Payer: Medicare Other | Admitting: Surgery

## 2016-08-03 ENCOUNTER — Encounter (HOSPITAL_COMMUNITY): Payer: Self-pay | Admitting: Emergency Medicine

## 2016-08-03 ENCOUNTER — Inpatient Hospital Stay (HOSPITAL_COMMUNITY)
Admission: EM | Admit: 2016-08-03 | Discharge: 2016-08-08 | DRG: 673 | Disposition: A | Discharge status: Home / Self Care | Payer: Medicare Other | Attending: Internal Medicine | Admitting: Internal Medicine

## 2016-08-03 ENCOUNTER — Other Ambulatory Visit (HOSPITAL_COMMUNITY): Payer: Medicare Other

## 2016-08-03 ENCOUNTER — Emergency Department (HOSPITAL_COMMUNITY): Payer: Medicare Other

## 2016-08-03 DIAGNOSIS — D649 Anemia, unspecified: Secondary | ICD-10-CM | POA: Diagnosis present

## 2016-08-03 DIAGNOSIS — N185 Chronic kidney disease, stage 5: Secondary | ICD-10-CM | POA: Diagnosis not present

## 2016-08-03 DIAGNOSIS — I120 Hypertensive chronic kidney disease with stage 5 chronic kidney disease or end stage renal disease: Principal | ICD-10-CM | POA: Diagnosis present

## 2016-08-03 DIAGNOSIS — M1A00X0 Idiopathic chronic gout, unspecified site, without tophus (tophi): Secondary | ICD-10-CM | POA: Diagnosis present

## 2016-08-03 DIAGNOSIS — J449 Chronic obstructive pulmonary disease, unspecified: Secondary | ICD-10-CM | POA: Diagnosis present

## 2016-08-03 DIAGNOSIS — Z8249 Family history of ischemic heart disease and other diseases of the circulatory system: Secondary | ICD-10-CM | POA: Diagnosis not present

## 2016-08-03 DIAGNOSIS — R197 Diarrhea, unspecified: Secondary | ICD-10-CM | POA: Diagnosis present

## 2016-08-03 DIAGNOSIS — Z6841 Body Mass Index (BMI) 40.0 and over, adult: Secondary | ICD-10-CM

## 2016-08-03 DIAGNOSIS — M1990 Unspecified osteoarthritis, unspecified site: Secondary | ICD-10-CM | POA: Diagnosis present

## 2016-08-03 DIAGNOSIS — E872 Acidosis: Secondary | ICD-10-CM | POA: Diagnosis not present

## 2016-08-03 DIAGNOSIS — Z87891 Personal history of nicotine dependence: Secondary | ICD-10-CM | POA: Diagnosis not present

## 2016-08-03 DIAGNOSIS — Z992 Dependence on renal dialysis: Secondary | ICD-10-CM | POA: Diagnosis not present

## 2016-08-03 DIAGNOSIS — D631 Anemia in chronic kidney disease: Secondary | ICD-10-CM | POA: Diagnosis present

## 2016-08-03 DIAGNOSIS — M1A30X0 Chronic gout due to renal impairment, unspecified site, without tophus (tophi): Secondary | ICD-10-CM | POA: Diagnosis not present

## 2016-08-03 DIAGNOSIS — L8990 Pressure ulcer of unspecified site, unspecified stage: Secondary | ICD-10-CM | POA: Diagnosis present

## 2016-08-03 DIAGNOSIS — R911 Solitary pulmonary nodule: Secondary | ICD-10-CM | POA: Diagnosis present

## 2016-08-03 DIAGNOSIS — H93A9 Pulsatile tinnitus, unspecified ear: Secondary | ICD-10-CM | POA: Diagnosis present

## 2016-08-03 DIAGNOSIS — K529 Noninfective gastroenteritis and colitis, unspecified: Secondary | ICD-10-CM | POA: Diagnosis present

## 2016-08-03 DIAGNOSIS — N2581 Secondary hyperparathyroidism of renal origin: Secondary | ICD-10-CM | POA: Diagnosis present

## 2016-08-03 DIAGNOSIS — R630 Anorexia: Secondary | ICD-10-CM | POA: Diagnosis present

## 2016-08-03 DIAGNOSIS — N186 End stage renal disease: Secondary | ICD-10-CM | POA: Diagnosis present

## 2016-08-03 DIAGNOSIS — Z888 Allergy status to other drugs, medicaments and biological substances status: Secondary | ICD-10-CM

## 2016-08-03 DIAGNOSIS — G4730 Sleep apnea, unspecified: Secondary | ICD-10-CM | POA: Diagnosis present

## 2016-08-03 DIAGNOSIS — E785 Hyperlipidemia, unspecified: Secondary | ICD-10-CM | POA: Diagnosis present

## 2016-08-03 DIAGNOSIS — R112 Nausea with vomiting, unspecified: Secondary | ICD-10-CM | POA: Diagnosis present

## 2016-08-03 DIAGNOSIS — N028 Recurrent and persistent hematuria with other morphologic changes: Secondary | ICD-10-CM | POA: Diagnosis present

## 2016-08-03 DIAGNOSIS — Z882 Allergy status to sulfonamides status: Secondary | ICD-10-CM

## 2016-08-03 DIAGNOSIS — Z419 Encounter for procedure for purposes other than remedying health state, unspecified: Secondary | ICD-10-CM

## 2016-08-03 DIAGNOSIS — K219 Gastro-esophageal reflux disease without esophagitis: Secondary | ICD-10-CM | POA: Diagnosis present

## 2016-08-03 DIAGNOSIS — Z96641 Presence of right artificial hip joint: Secondary | ICD-10-CM | POA: Diagnosis present

## 2016-08-03 DIAGNOSIS — I4510 Unspecified right bundle-branch block: Secondary | ICD-10-CM | POA: Diagnosis present

## 2016-08-03 DIAGNOSIS — Z8719 Personal history of other diseases of the digestive system: Secondary | ICD-10-CM

## 2016-08-03 DIAGNOSIS — I10 Essential (primary) hypertension: Secondary | ICD-10-CM | POA: Diagnosis present

## 2016-08-03 DIAGNOSIS — Z23 Encounter for immunization: Secondary | ICD-10-CM

## 2016-08-03 DIAGNOSIS — Z79899 Other long term (current) drug therapy: Secondary | ICD-10-CM

## 2016-08-03 DIAGNOSIS — Z95828 Presence of other vascular implants and grafts: Secondary | ICD-10-CM

## 2016-08-03 DIAGNOSIS — N189 Chronic kidney disease, unspecified: Secondary | ICD-10-CM | POA: Diagnosis present

## 2016-08-03 DIAGNOSIS — M1030 Gout due to renal impairment, unspecified site: Secondary | ICD-10-CM | POA: Diagnosis not present

## 2016-08-03 DIAGNOSIS — Z96651 Presence of right artificial knee joint: Secondary | ICD-10-CM

## 2016-08-03 LAB — URINALYSIS, ROUTINE W REFLEX MICROSCOPIC
BILIRUBIN URINE: NEGATIVE
GLUCOSE, UA: NEGATIVE mg/dL
KETONES UR: NEGATIVE mg/dL
LEUKOCYTES UA: NEGATIVE
Nitrite: NEGATIVE
PH: 5 (ref 5.0–8.0)
Protein, ur: 100 mg/dL — AB
Specific Gravity, Urine: 1.009 (ref 1.005–1.030)
Squamous Epithelial / LPF: NONE SEEN

## 2016-08-03 LAB — COMPREHENSIVE METABOLIC PANEL
ALT: 24 U/L (ref 17–63)
ANION GAP: 15 (ref 5–15)
AST: 27 U/L (ref 15–41)
Albumin: 3.2 g/dL — ABNORMAL LOW (ref 3.5–5.0)
Alkaline Phosphatase: 54 U/L (ref 38–126)
BILIRUBIN TOTAL: 0.6 mg/dL (ref 0.3–1.2)
BUN: 110 mg/dL — AB (ref 6–20)
CO2: 17 mmol/L — ABNORMAL LOW (ref 22–32)
Calcium: 8.7 mg/dL — ABNORMAL LOW (ref 8.9–10.3)
Chloride: 111 mmol/L (ref 101–111)
Creatinine, Ser: 7.9 mg/dL — ABNORMAL HIGH (ref 0.61–1.24)
GFR calc Af Amer: 7 mL/min — ABNORMAL LOW (ref >60)
GFR calc non Af Amer: 6 mL/min — ABNORMAL LOW (ref >60)
Glucose, Bld: 104 mg/dL — ABNORMAL HIGH (ref 65–99)
POTASSIUM: 3.6 mmol/L (ref 3.5–5.1)
Sodium: 143 mmol/L (ref 135–145)
TOTAL PROTEIN: 7.3 g/dL (ref 6.5–8.1)

## 2016-08-03 LAB — CBC
HEMATOCRIT: 31.7 % — AB (ref 39.0–52.0)
Hemoglobin: 9.9 g/dL — ABNORMAL LOW (ref 13.0–17.0)
MCH: 29.9 pg (ref 26.0–34.0)
MCHC: 31.2 g/dL (ref 30.0–36.0)
MCV: 95.8 fL (ref 78.0–100.0)
PLATELETS: 225 10*3/uL (ref 150–400)
RBC: 3.31 MIL/uL — ABNORMAL LOW (ref 4.22–5.81)
RDW: 14.1 % (ref 11.5–15.5)
WBC: 5.9 10*3/uL (ref 4.0–10.5)

## 2016-08-03 LAB — LIPASE, BLOOD: Lipase: 62 U/L — ABNORMAL HIGH (ref 11–51)

## 2016-08-03 MED ORDER — SIMVASTATIN 20 MG PO TABS
20.0000 mg | ORAL_TABLET | Freq: Every morning | ORAL | Status: DC
  Administered 2016-08-05 – 2016-08-08 (×4): 20 mg via ORAL
  Filled 2016-08-03 (×4): qty 1

## 2016-08-03 MED ORDER — HEPARIN SODIUM (PORCINE) 5000 UNIT/ML IJ SOLN
5000.0000 [IU] | Freq: Three times a day (TID) | INTRAMUSCULAR | Status: DC
  Administered 2016-08-03 – 2016-08-08 (×12): 5000 [IU] via SUBCUTANEOUS
  Filled 2016-08-03 (×10): qty 1

## 2016-08-03 MED ORDER — HYDRALAZINE HCL 50 MG PO TABS
50.0000 mg | ORAL_TABLET | Freq: Three times a day (TID) | ORAL | Status: DC
  Administered 2016-08-03 – 2016-08-08 (×8): 50 mg via ORAL
  Filled 2016-08-03 (×13): qty 1

## 2016-08-03 MED ORDER — SODIUM CHLORIDE 0.9% FLUSH
3.0000 mL | Freq: Two times a day (BID) | INTRAVENOUS | Status: DC
  Administered 2016-08-03 – 2016-08-08 (×9): 3 mL via INTRAVENOUS

## 2016-08-03 MED ORDER — FEBUXOSTAT 40 MG PO TABS
40.0000 mg | ORAL_TABLET | ORAL | Status: DC
  Administered 2016-08-06 – 2016-08-08 (×2): 40 mg via ORAL
  Filled 2016-08-03 (×3): qty 1

## 2016-08-03 MED ORDER — ONDANSETRON HCL 4 MG/2ML IJ SOLN
4.0000 mg | Freq: Three times a day (TID) | INTRAMUSCULAR | Status: DC | PRN
  Administered 2016-08-03 – 2016-08-08 (×6): 4 mg via INTRAVENOUS
  Filled 2016-08-03 (×6): qty 2

## 2016-08-03 MED ORDER — FAMOTIDINE 20 MG PO TABS
20.0000 mg | ORAL_TABLET | Freq: Two times a day (BID) | ORAL | Status: DC
  Administered 2016-08-03 – 2016-08-08 (×8): 20 mg via ORAL
  Filled 2016-08-03 (×9): qty 1

## 2016-08-03 MED ORDER — ONDANSETRON HCL 4 MG/2ML IJ SOLN
4.0000 mg | Freq: Once | INTRAMUSCULAR | Status: AC
  Administered 2016-08-03: 4 mg via INTRAVENOUS
  Filled 2016-08-03: qty 2

## 2016-08-03 MED ORDER — OMEGA-3-ACID ETHYL ESTERS 1 G PO CAPS
2.0000 g | ORAL_CAPSULE | Freq: Two times a day (BID) | ORAL | Status: DC
  Administered 2016-08-04 – 2016-08-08 (×6): 2 g via ORAL
  Filled 2016-08-03 (×8): qty 2

## 2016-08-03 MED ORDER — ONDANSETRON 4 MG PO TBDP: 4.0000 mg | ORAL_TABLET | Freq: Once | ORAL | Status: DC | PRN

## 2016-08-03 MED ORDER — FUROSEMIDE 80 MG PO TABS
80.0000 mg | ORAL_TABLET | Freq: Two times a day (BID) | ORAL | Status: DC
  Filled 2016-08-03: qty 1

## 2016-08-03 MED ORDER — LORATADINE 10 MG PO TABS
10.0000 mg | ORAL_TABLET | Freq: Every day | ORAL | Status: DC
  Administered 2016-08-03 – 2016-08-07 (×4): 10 mg via ORAL
  Filled 2016-08-03 (×5): qty 1

## 2016-08-03 MED ORDER — CALCITRIOL 0.25 MCG PO CAPS
0.2500 ug | ORAL_CAPSULE | Freq: Every morning | ORAL | Status: DC
  Administered 2016-08-05 – 2016-08-08 (×4): 0.25 ug via ORAL
  Filled 2016-08-03 (×4): qty 1

## 2016-08-03 NOTE — ED Notes (Signed)
Pt placed on 2L [place] O2 sat dropping into 80s. EDP at bedside. Pt A&OX4

## 2016-08-03 NOTE — ED Notes (Signed)
Pt unhooked self and ambulated to RR with son.

## 2016-08-03 NOTE — Consult Note (Signed)
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 Johnney Killian with progressive CKD.  Pt reports that over the past week he has been having progressive n/v, decreased appetite, metallic taste in mouth, and some increased LE edema.  He last saw Dr. [name] on 07/27/2016.  His serum creatinine was noted to be progressively rising from the mid-4s and was 7.59 at that time.  His Cozaar was stopped and was replaced by hydralazine 50 mg TID.  Lasix was decreased to 80 mg daily.  SPEP, random urine IFE, urine eos were all unremarkable.  UA with 2+ protein and no blood.  CK was minimally elevated at 310.  The evenual need to start dialysis was discussed and a vein mapping appointment was made for this AM (which pt did not make it to).  Since pt's appointment on 6/11, he has had increased n/v.  His appetite has worsened and according to his wife he has had some "dry heaves".  He also notes that he is "trembling" quite a bit too.  He contacted our office this AM and was advised to come to the ED for potential initiation of dialysis.    Today, Scr is 7.90 with a BUN of 112.  He is tired, feels washed out, and nauseated.  His LE edema is a bit worse.   He denies HA, CP, palpitations (of note he has a cardiac monitor in place to be mailed back on Thursday for evaluation of new RBBB).  + 2 pillow orthopnea.        Past Medical History:  Diagnosis Date  . Anemia   . Arthritis   . Chronic kidney disease   . GERD (gastroesophageal  reflux disease)   . Gout   . Hypertension     Past Surgical History:  Procedure Laterality Date  . APPENDECTOMY  1963  . CARPAL TUNNEL RELEASE Bilateral 2006 right and 2007 left   . CHOLECYSTECTOMY  1979  . FOOT SURGERY Left 1981  . JOINT REPLACEMENT Right 2008   . JOINT REPLACEMENT Left 2010  . LUMBAR LAMINECTOMY/DECOMPRESSION MICRODISCECTOMY N/A 08/18/2012   Procedure: LUMBAR LAMINECTOMY/DECOMPRESSION MICRODISCECTOMY 3 LEVELS;  Surgeon: Hosie Spangle, MD;  Location: [facility];  Service: Neurosurgery;  Laterality: N/A;  Lumbar Two through Five Laminectomy  . SHOULDER ARTHROSCOPY WITH ROTATOR CUFF REPAIR Right 2001  . TOTAL HIP ARTHROPLASTY Right 07/26/2013   Procedure: TOTAL HIP ARTHROPLASTY;  Surgeon: Kerin Salen, MD;  Location: [facility];  Service: Orthopedics;  Laterality: Right;  . WRIST TENDON TRANSFER Bilateral 1972 left and 1973 right    Family History  Problem Relation Age of Onset  . Heart disease Mother   .  Cancer Father    Social History:  reports that he quit smoking about 33 years ago. His smoking use included Cigarettes. He has a 10.00 pack-year smoking history. He has quit using smokeless tobacco. He reports that he drinks alcohol. He reports that he does not use drugs.  Allergies:  Allergies  Allergen Reactions  . Altace [Ramipril] Cough    Chronic cough  . Cholestyramine Light Other (See Comments)    unspecified  . Cozaar [Losartan Potassium] Other (See Comments)    Stopped due to kidney decline 4/18  . Kayexalate [Polystyrene] Other (See Comments)    Abnormal ekg  . Other Swelling    "Symvisc"   . Sulfa Antibiotics Rash     (Not in a hospital admission)  Results for orders placed or performed during the hospital encounter of 08/03/16 (from the past 48 hour(s))  Lipase, blood     Status: Abnormal   Collection Time: 08/03/16  9:38 AM  Result Value Ref Range   Lipase 62 (H) 11 - 51 U/L  Comprehensive metabolic panel     Status: Abnormal    Collection Time: 08/03/16  9:38 AM  Result Value Ref Range   Sodium 143 135 - 145 mmol/L   Potassium 3.6 3.5 - 5.1 mmol/L   Chloride 111 101 - 111 mmol/L   CO2 17 (L) 22 - 32 mmol/L   Glucose, Bld 104 (H) 65 - 99 mg/dL   BUN 110 (H) 6 - 20 mg/dL   Creatinine, Ser 7.90 (H) 0.61 - 1.24 mg/dL   Calcium 8.7 (L) 8.9 - 10.3 mg/dL   Total Protein 7.3 6.5 - 8.1 g/dL   Albumin 3.2 (L) 3.5 - 5.0 g/dL   AST 27 15 - 41 U/L   ALT 24 17 - 63 U/L   Alkaline Phosphatase 54 38 - 126 U/L   Total Bilirubin 0.6 0.3 - 1.2 mg/dL   GFR calc non Af Amer 6 (L) >60 mL/min   GFR calc Af Amer 7 (L) >60 mL/min    Comment: (NOTE) The eGFR has been calculated using the CKD EPI equation. This calculation has not been validated in all clinical situations. eGFR's persistently <60 mL/min signify possible Chronic Kidney Disease.    Anion gap 15 5 - 15  CBC     Status: Abnormal   Collection Time: 08/03/16  9:38 AM  Result Value Ref Range   WBC 5.9 4.0 - 10.5 K/uL   RBC 3.31 (L) 4.22 - 5.81 MIL/uL   Hemoglobin 9.9 (L) 13.0 - 17.0 g/dL   HCT 31.7 (L) 39.0 - 52.0 %   MCV 95.8 78.0 - 100.0 fL   MCH 29.9 26.0 - 34.0 pg   MCHC 31.2 30.0 - 36.0 g/dL   RDW 14.1 11.5 - 15.5 %   Platelets 225 150 - 400 K/uL  Urinalysis, Routine w reflex microscopic     Status: Abnormal   Collection Time: 08/03/16 10:07 AM  Result Value Ref Range   Color, Urine STRAW (A) YELLOW   APPearance CLEAR CLEAR   Specific Gravity, Urine 1.009 1.005 - 1.030   pH 5.0 5.0 - 8.0   Glucose, UA NEGATIVE NEGATIVE mg/dL   Hgb urine dipstick SMALL (A) NEGATIVE   Bilirubin Urine NEGATIVE NEGATIVE   Ketones, ur NEGATIVE NEGATIVE mg/dL   Protein, ur 100 (A) NEGATIVE mg/dL   Nitrite NEGATIVE NEGATIVE   Leukocytes, UA NEGATIVE NEGATIVE   RBC / HPF 0-5 0 - 5 RBC/hpf   WBC, UA 0-5 0 -  5 WBC/hpf   Bacteria, UA RARE (A) NONE SEEN   Squamous Epithelial / LPF NONE SEEN NONE SEEN   Ct Abdomen Pelvis Wo Contrast  Result Date: 08/03/2016 CLINICAL DATA:   Abdominal pain for several days with nausea and vomiting EXAM: CT ABDOMEN AND PELVIS WITHOUT CONTRAST TECHNIQUE: Multidetector CT imaging of the abdomen and pelvis was performed following the standard protocol without IV contrast. COMPARISON:  None. FINDINGS: Lower chest: The lung bases are well aerated. A 10 mm nodule is noted in the lateral aspect of the right lower lobe best seen on image number 11 of series 4. Hepatobiliary: Gallbladder has been surgically removed. The liver is within normal limits. Pancreas: Unremarkable. No pancreatic ductal dilatation or surrounding inflammatory changes. Spleen: Normal in size without focal abnormality. Adrenals/Urinary Tract: The adrenal glands are within normal limits. The kidneys demonstrates some exophytic lesions consistent with cysts. One of them on the right is hyperdense likely related to a hemorrhagic cyst. No obstructive changes are seen. No calculi are identified. The bladder is partially distended Stomach/Bowel: Scattered diverticular change of the colon is noted without evidence of diverticulitis. The appendix has been surgically removed. No obstructive or inflammatory changes are noted. Vascular/Lymphatic: Aortic atherosclerosis. No enlarged abdominal or pelvic lymph nodes. Reproductive: Prostate is unremarkable. Other: No abdominal wall hernia or abnormality. No abdominopelvic ascites. Musculoskeletal: Right hip replacement is noted. Degenerative changes of the lumbar spine are seen. Old healed rib fractures on the right are noted. IMPRESSION: Chronic changes as described above. 10 mm nodule in the right lower lobe. Repeat CT in 3 months is recommended for further evaluation. This recommendation follows the consensus statement: Guidelines for Management of Incidental Pulmonary Nodules Detected on CT Images: From the Fleischner Society 2017; Radiology 2017; 284:228-243. Electronically Signed   By: Inez Catalina M.D.   On: 08/03/2016 13:41    ROS: all other  systems reviewed and are negative except as per HPI  Blood pressure (!) 137/57, pulse 82, temperature 98.6 F (37 C), temperature source Oral, resp. rate 17, height 5' 11" (1.803 m), weight (!) 157.9 kg (348 lb), SpO2 95 %. Physical Exam  GEN older gentleman, appears uncomfortable, intermittently hiccuping HEENT EOMI, PERRL, MMM NECK no JVD, thryomegaly, or carotid bruits PULM normal WOB, clear anteriorly, diminished in bases bilaterally CV RRR, no m/r/g ABD mild epigastric discomfort, protuberant, NABS EXT 1+ LE edema NEURO nonfocal SKIN no rashes or lesions MSK: no active gout flare at this time  Assessment/Plan  1.  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  Pt having progressive symptoms of uremia and no improvement of Scr after discontinuation of ARB.  Investigation into secondary causes of progressive CKD unrevealing.  Have discussed with pt and family of the need to initiate dialysis.  In agreement.  Will do vein mapping, VVS c/s, initiate HD, starting CLIP process.  2.  N/v: Most likely uremia; relieved a little with Zofran.  Per primary but expect to improve with dialysis.  3.  HTN: On hydralazine 50 mg TID; continue  4.  Volume: some increasing edema; would increase back to Lasix 80 mg BID while awaiting initiation of HD  5.  New RBBB: cardiac monitor to be mailed back Thursday (still recommend this plan), TTE performed in [place]  6. Gout: on Uloric 40 mg Qod.  7.  Anemia: Aranesp 60 mcg due tomorrow, will order  8.  Bone/ mineral: continue calcitriol 0.25 mcg daily  Madelon Lips, MD [facility] pgr [phone]  08/03/2016, 2:53 PM

## 2016-08-03 NOTE — ED Notes (Signed)
ED Provider at bedside. 

## 2016-08-03 NOTE — ED Notes (Signed)
Pt c/o nausea. EDP made aware of pt request for more nausea medication. No new orders at this time.

## 2016-08-03 NOTE — ED Notes (Signed)
RN Blanch Media to call this RN back when bed in room.

## 2016-08-03 NOTE — H&P (Signed)
Date: 08/03/2016               Patient Name:  Richard Bush MRN: [id number]  DOB: [birth year] Age / Sex: 66 y.o., male   PCP: Quentin Cornwall, MD         Medical Service: Internal Medicine Teaching Service         Attending Physician: Dr. Lucious Groves, DO    First Contact: Dr. Hetty Ely Pager: [phone]  Second Contact: Dr. Juleen China  Pager: [phone]       After Hours (After 5p/  First Contact Pager: [phone]  weekends / holidays): Second Contact Pager: [phone]   Chief Complaint:   History of Present Illness: 66 yo man with PMH CKD stage V secondary to IgA nephropathy, HTN, HLD, gout, and morbid obesity who presents with 1 week of nausea, diarrhea, vomiting, headache, shaking, chills, decreased appetite, metallic taste. Some of these symptoms started around May 25 he relates this to starting cholestyramine and stopping omeprazole. The past week he hasn't been eating much but managed to continue taking his blood pressure medications and lasix.  He's been following with Dr. [name] at [facility] since 2007 but has been following with nephrology since 1996 for nephropathy, last visit was 07/2015 SPEP, random urine IFE, and urine eos were unremarkable, UA revealed 2+ protein without blood. They had discussed the need to start dialysis and he had an appointment scheduled for vein mapping today.   In the ED he was found to be afebrile, HR 83, BP 157/71, SpO2 98% on room air. Labs revealed K 3.6, Crt 7.9, BUN 110, GFR 6, anion gap 15, hgb 9.9. Urinalysis revealed rare bacteria, small hgb, 100 protein.    Meds:  Current Meds  Medication Sig  . acetaminophen (TYLENOL) 500 MG tablet Take 500 mg by mouth 4 (four) times daily.  . calcitRIOL (ROCALTROL) 0.25 MCG capsule Take 0.25 mcg by mouth every morning.  . cetirizine (ZYRTEC) 10 MG tablet Take 10 mg by mouth at bedtime.  . colchicine 0.6 MG tablet Take 1 tablet (0.6 mg total) by mouth daily as needed (for gout flareups). (Patient taking  differently: Take 0.3 mg by mouth daily as needed (for gout flareups). )  . febuxostat (ULORIC) 40 MG tablet Take 1 tablet (40 mg total) by mouth daily. (Patient taking differently: Take 40 mg by mouth every other day. )  . furosemide (LASIX) 80 MG tablet Take 80 mg by mouth every morning. Takes 80mg  at breakfast, and 80mg  at lunch, daily   . hydrALAZINE (APRESOLINE) 25 MG tablet Take 50 mg by mouth 3 (three) times daily.   Marland Kitchen loperamide (IMODIUM) 2 MG capsule Take by mouth as needed for diarrhea or loose stools.  Marland Kitchen omega-3 acid ethyl esters (LOVAZA) 1 G capsule Take 2 g by mouth 2 (two) times daily.  . ranitidine (ZANTAC) 150 MG tablet Take 150 mg by mouth 2 (two) times daily.  . simvastatin (ZOCOR) 20 MG tablet Take 20 mg by mouth every morning.     Allergies: Allergies as of 08/03/2016 - Review Complete 08/03/2016  Allergen Reaction Noted  . Altace [ramipril] Cough 08/03/2016  . Cholestyramine light Other (See Comments) 08/03/2016  . Cozaar [losartan potassium] Other (See Comments) 08/03/2016  . Kayexalate [polystyrene] Other (See Comments) 08/03/2016  . Other Swelling 08/08/2012  . Sulfa antibiotics Rash 08/08/2012   Past Medical History:  Diagnosis Date  . Anemia   . Arthritis   . Chronic kidney disease   .  GERD (gastroesophageal reflux disease)   . Gout   . Hypertension    Family History: Mother died at age 53 and live with sinoatrial node dysfunction. Father had lung and liver cancer.  Social History: Quit smoking 30 years ago, previously smoked 3 packs per day for 20 years. Previously drinks socially however he is not drinking years.  Review of Systems: A complete ROS was negative except as per HPI.   Physical Exam: Blood pressure 135/60, pulse 81, temperature 98.6 F (37 C), temperature source Oral, resp. rate 13, height 5\' 11"  (1.803 m), weight (!) 348 lb (157.9 kg), SpO2 95 %. Physical Exam  Constitutional: He is oriented to person, place, and time. He appears  well-developed and well-nourished. No distress.  Uncomfortable but non septic appearing   HENT:  Head: Normocephalic and atraumatic.  Eyes: Conjunctivae are normal. Right eye exhibits no discharge. Left eye exhibits no discharge. No scleral icterus.  Cardiovascular: Normal rate and regular rhythm.   No murmur heard. Pulmonary/Chest: Effort normal. No respiratory distress. He has no wheezes. He has no rales.  Abdominal: Soft. Bowel sounds are normal. He exhibits distension. There is no tenderness. There is no guarding.  Neurological: He is alert and oriented to person, place, and time.  Skin: Skin is warm and dry. He is not diaphoretic.  Psychiatric: He has a normal mood and affect. His behavior is normal.   LABS  Na 143, K 3.6, bicarb 17, BUN 110, crt 7.9  Lipase 62, AST 27, ALT 24  WBC 5.9, Hgb 9.9, plt 225  Urinalysis - rare bacteria, small hgb, protein 100, 0-5 RBCs   EKG: left axis deviation, RBBB new from prior 07/2013  Personal review of the CT abdomen and pelvis revealed renal cyst, distended bladder, no renal calculi appreciated, and colonic diverticula, right hip s/p replacement.   Assessment & Plan by Problem: Principal Problem:   ESRD (end stage renal disease) ([place]) Active Problems:   Idiopathic chronic gout without tophus   Hypertension   History of gastroesophageal reflux (GERD)   Pulmonary nodule   Right bundle branch block  Chronic kidney disease Stage V necessitating hemodialysis  Nausea and vomiting Secondary to IgA nephropathy and hypertension. He's now having symptoms related to uremia. Allergy was consultation in the ED and discuss the need to initiate dialysis and started the CLIP process. He'll have a temporary HD cath placed and initiate HD tomorrow.  Continue Zofran Vein mapping, Vascular surgery consult tomorrow morning -calcitrion 0.25 mg daily  -follow up phos  - follow up Heb B surface antigen -Zofran PRN  -renal diet   Hypertension At his last  appointment with nephrology 07/2016 losartan was stopped and replaced with hydralazine 50 mg 3 times a day.  Continue home in hydralazine  Pulmonary nodule  10 mm nodule in the right lower lobe found incidentally on CT abdomen and pelvis. Radiology recommended repeat CT in 3 months   Gout Continue home medication E Laurie 40 mg daily  Normocytic anemia Hgb 9.9, MCV 96, no prior iron studies in our system.  - Start aranesp tomorrow  -follow up ferritin  - follow up iron and TIBC   GERD  - famotidine  20 mg BID  Dispo: Admit patient to Inpatient with expected length of stay greater than 2 midnights.  Signed: Ledell Noss, MD 08/03/2016, 9:17 PM  Pager: [phone]

## 2016-08-03 NOTE — ED Triage Notes (Signed)
Pt reports n/v/d x 5 days, reports being told by PCP to have kidney function checked.  Pt denies abd pain, CP, fever, chills.

## 2016-08-03 NOTE — ED Notes (Signed)
Patient transported to CT 

## 2016-08-03 NOTE — ED Provider Notes (Signed)
n [facility]-EM[facility]ENC[facility] DEPT Provider Note   CSN: [id number] Arrival date & time: 08/03/16  5361     History   Chief Complaint Chief Complaint  Patient presents with  . Emesis  . Abnormal Lab  . Diarrhea    HPI Richard Bush is a 66 y.o. male who presents with nausea, vomiting, diarrhea. PMH significant for arthritis, gout, CKD stage 4 (GFR 10 followed by Dr. [name]), HTN, GERD, hyperparathyroidism. He has not felt well for the past several weeks. He has had ongoing nausea, vomiting, diarrhea. He does have chronic diarrhea which is worse than baseline. He has never had these symptoms before but they have been slowly progressive. He has also had chills, decreased appetite, hand tremors and pulsatile tinnitus. He reports being bit by multiple ticks however denies any acute illness after that or rash. He was seen by his PCP on 5/31 and labs are overall unremarkable other than elevated creatinine. He saw Dr. [name] on 6/11 and showed that his SCr was rising. He was sent to a cardiologist because of abnormality in his EKG and currently has a Holter monitor. Had an echo as well. No fever, chest pain, shortness of breath. He has had a colonoscopy in April and 2 polyps were removed. He has plans for dialysis in the very near future. He called his nephrologists office this morning and they advised him to come to the ED.    HPI  Past Medical History:  Diagnosis Date  . Anemia   . Arthritis   . Chronic kidney disease   . GERD (gastroesophageal reflux disease)   . Gout   . Hypertension     Patient Active Problem List   Diagnosis Date Noted  . History of gastroesophageal reflux (GERD) 07/13/2016  . Idiopathic chronic gout without tophus 07/03/2016  . Primary osteoarthritis of left knee 07/03/2016  . Stage 4 chronic kidney disease ([place]) 07/03/2016  . Osteoarthritis of hand 07/03/2016  . Hypertension 07/03/2016  . Hyperparathyroidism ([place]) 07/03/2016  . Primary osteoarthritis of both feet  07/03/2016  . History of total hip replacement, right 07/03/2016  . History of total knee arthroplasty, right 07/03/2016    Past Surgical History:  Procedure Laterality Date  . APPENDECTOMY  1963  . CARPAL TUNNEL RELEASE Bilateral 2006 right and 2007 left   . CHOLECYSTECTOMY  1979  . FOOT SURGERY Left 1981  . JOINT REPLACEMENT Right 2008   . JOINT REPLACEMENT Left 2010  . LUMBAR LAMINECTOMY/DECOMPRESSION MICRODISCECTOMY N/A 08/18/2012   Procedure: LUMBAR LAMINECTOMY/DECOMPRESSION MICRODISCECTOMY 3 LEVELS;  Surgeon: Hosie Spangle, MD;  Location: [place] [facility];  Service: Neurosurgery;  Laterality: N/A;  Lumbar Two through Five Laminectomy  . SHOULDER ARTHROSCOPY WITH ROTATOR CUFF REPAIR Right 2001  . TOTAL HIP ARTHROPLASTY Right 07/26/2013   Procedure: TOTAL HIP ARTHROPLASTY;  Surgeon: Kerin Salen, MD;  Location: [place];  Service: Orthopedics;  Laterality: Right;  . WRIST TENDON TRANSFER Bilateral 1972 left and 1973 right       Home Medications    Prior to Admission medications   Medication Sig Start Date End Date Taking? Authorizing Provider  acetaminophen (TYLENOL ARTHRITIS PAIN) 650 MG CR tablet Take 650 mg by mouth every 8 (eight) hours as needed for pain.    [provider]  amLODipine (NORVASC) 5 MG tablet Take 5 mg by mouth daily.    [provider]  calcitRIOL (ROCALTROL) 0.25 MCG capsule Take 0.25 mcg by mouth every morning.    [provider]  cetirizine (  ZYRTEC) 10 MG tablet Take 10 mg by mouth at bedtime.    [provider]  cholestyramine light (PREVALITE) 4 g packet Take 4 g by mouth 2 (two) times daily.    [provider]  colchicine 0.6 MG tablet Take 1 tablet (0.6 mg total) by mouth daily as needed (for gout flareups). 07/15/16 10/13/16  Bo Merino, MD  diclofenac sodium (VOLTAREN) 1 % GEL Apply 2 g topically daily as needed (pain).    [provider]  febuxostat (ULORIC) 40 MG tablet Take 1 tablet (40 mg  total) by mouth daily. 01/16/16 07/14/16  Panwala, Naitik, PA-C  furosemide (LASIX) 80 MG tablet Take 80 mg by mouth 2 (two) times daily. Takes 80mg  at breakfast, and 80mg  at lunch, daily    [provider]  gabapentin (NEURONTIN) 100 MG capsule Take 200 mg by mouth at bedtime.     [provider]  hydrALAZINE (APRESOLINE) 25 MG tablet Take 25 mg by mouth 3 (three) times daily.    [provider]  loperamide (IMODIUM) 2 MG capsule Take by mouth as needed for diarrhea or loose stools.    [provider]  omega-3 acid ethyl esters (LOVAZA) 1 G capsule Take 2 g by mouth 2 (two) times daily.    [provider]  omeprazole (PRILOSEC) 20 MG capsule Take 20 mg by mouth daily.    [provider]  oxyCODONE-acetaminophen (ROXICET) 5-325 MG per tablet Take 1 tablet by mouth every 4 (four) hours as needed. Patient not taking: Reported on 07/15/2016 07/26/13   Leighton Parody, PA-C  simvastatin (ZOCOR) 20 MG tablet Take 20 mg by mouth every morning.    [provider]  sodium bicarbonate 650 MG tablet Take 1,300 mg by mouth 2 (two) times daily.    [provider]  vitamin C (ASCORBIC ACID) 500 MG tablet Take 500 mg by mouth every morning.    [provider]  vitamin E 400 UNIT capsule Take 400 Units by mouth daily.    [provider]    Family History Family History  Problem Relation Age of Onset  . Heart disease Mother   . Cancer Father     Social History Social History  Substance Use Topics  . Smoking status: Former Smoker    Packs/day: 1.00    Years: 10.00    Types: Cigarettes    Quit date: 08/13/1982  . Smokeless tobacco: Former Systems developer  . Alcohol use Yes     Comment: rarely     Allergies   Other and Sulfa antibiotics   Review of Systems Review of Systems  Constitutional: Positive for appetite change and chills. Negative for fever.  Respiratory: Negative for shortness of breath.   Cardiovascular:  Negative for chest pain.  Gastrointestinal: Positive for abdominal pain, blood in stool, diarrhea, nausea and vomiting.  Genitourinary: Negative for dysuria.  Neurological: Negative for headaches.  All other systems reviewed and are negative.    Physical Exam Updated Vital Signs BP (!) 157/71 (BP Location: Right Arm)   Pulse 85   Temp 98.6 F (37 C) (Oral)   Resp 20   Ht 5\' 11"  (1.803 m)   Wt (!) 157.9 kg (348 lb)   SpO2 98%   BMI 48.54 kg/m   Physical Exam  Constitutional: He is oriented to person, place, and time. He appears well-developed and well-nourished. No distress.  Obese, appears uncomfortable  HENT:  Head: Normocephalic and atraumatic.  Eyes: Conjunctivae are normal. Pupils  are equal, round, and reactive to light. Right eye exhibits no discharge. Left eye exhibits no discharge. No scleral icterus.  Neck: Normal range of motion.  Cardiovascular: Normal rate and regular rhythm.  Exam reveals no gallop and no friction rub.   No murmur heard. Pulmonary/Chest: Effort normal and breath sounds normal. No respiratory distress. He has no wheezes. He has no rales. He exhibits no tenderness.  Abdominal: Soft. Bowel sounds are normal. He exhibits no distension and no mass. There is tenderness (periumbilical tenderness). There is no rebound and no guarding. No hernia.  Prior cholecystecomy scar  Musculoskeletal:  Bilateral nonpitting edema of ankles  Neurological: He is alert and oriented to person, place, and time.  Skin: Skin is warm and dry.  Psychiatric: His behavior is normal. His affect is blunt.  Nursing note and vitals reviewed.    ED Treatments / Results  Labs (all labs ordered are listed, but only abnormal results are displayed) Labs Reviewed  LIPASE, BLOOD - Abnormal; Notable for the following:       Result Value   Lipase 62 (*)    All other components within normal limits  COMPREHENSIVE METABOLIC PANEL - Abnormal; Notable for the following:    CO2 17 (*)      Glucose, Bld 104 (*)    BUN 110 (*)    Creatinine, Ser 7.90 (*)    Calcium 8.7 (*)    Albumin 3.2 (*)    GFR calc non Af Amer 6 (*)    GFR calc Af Amer 7 (*)    All other components within normal limits  CBC - Abnormal; Notable for the following:    RBC 3.31 (*)    Hemoglobin 9.9 (*)    HCT 31.7 (*)    All other components within normal limits  URINALYSIS, ROUTINE W REFLEX MICROSCOPIC - Abnormal; Notable for the following:    Color, Urine STRAW (*)    Hgb urine dipstick SMALL (*)    Protein, ur 100 (*)    Bacteria, UA RARE (*)    All other components within normal limits    EKG  EKG Interpretation None       Radiology Ct Abdomen Pelvis Wo Contrast  Result Date: 08/03/2016 CLINICAL DATA:  Abdominal pain for several days with nausea and vomiting EXAM: CT ABDOMEN AND PELVIS WITHOUT CONTRAST TECHNIQUE: Multidetector CT imaging of the abdomen and pelvis was performed following the standard protocol without IV contrast. COMPARISON:  None. FINDINGS: Lower chest: The lung bases are well aerated. A 10 mm nodule is noted in the lateral aspect of the right lower lobe best seen on image number 11 of series 4. Hepatobiliary: Gallbladder has been surgically removed. The liver is within normal limits. Pancreas: Unremarkable. No pancreatic ductal dilatation or surrounding inflammatory changes. Spleen: Normal in size without focal abnormality. Adrenals/Urinary Tract: The adrenal glands are within normal limits. The kidneys demonstrates some exophytic lesions consistent with cysts. One of them on the right is hyperdense likely related to a hemorrhagic cyst. No obstructive changes are seen. No calculi are identified. The bladder is partially distended Stomach/Bowel: Scattered diverticular change of the colon is noted without evidence of diverticulitis. The appendix has been surgically removed. No obstructive or inflammatory changes are noted. Vascular/Lymphatic: Aortic atherosclerosis. No enlarged  abdominal or pelvic lymph nodes. Reproductive: Prostate is unremarkable. Other: No abdominal wall hernia or abnormality. No abdominopelvic ascites. Musculoskeletal: Right hip replacement is noted. Degenerative changes of the lumbar spine are seen. Old healed rib  fractures on the right are noted. IMPRESSION: Chronic changes as described above. 10 mm nodule in the right lower lobe. Repeat CT in 3 months is recommended for further evaluation. This recommendation follows the consensus statement: Guidelines for Management of Incidental Pulmonary Nodules Detected on CT Images: From the Fleischner Society 2017; Radiology 2017; 284:228-243. Electronically Signed   By: Inez Catalina M.D.   On: 08/03/2016 13:41    Procedures Procedures (including critical care time)  Medications Ordered in ED Medications  ondansetron (ZOFRAN) injection 4 mg (4 mg Intravenous Given 08/03/16 1109)     Initial Impression / Assessment and Plan / ED Course  I have reviewed the triage vital signs and the nursing notes.  Pertinent labs & imaging results that were available during my care of the patient were reviewed by me and considered in my medical decision making (see chart for details).  66 year old male who presents progressive weakness, N/V likely due to uremia. Vitals are normal. Abdomen is soft and minimally tender. BUN/SCr are 110/7.9 up from 6.78/92 several weeks ago. Hgb is at baseline. CT is negative for acute abnormality. Spoke with Dr. Hollie Salk with nephrology who will consult on patient. Plan is to initiate dialysis on this admission. Spoke with teaching service who will admit.   Final Clinical Impressions(s) / ED Diagnoses   Final diagnoses:  ESRD (end stage renal disease) [facility])    New Prescriptions New Prescriptions   No medications on file     Iris Pert 08/03/16 2047    Charlesetta Shanks, MD 08/10/16 [phone]

## 2016-08-04 ENCOUNTER — Inpatient Hospital Stay (HOSPITAL_COMMUNITY): Payer: Medicare Other

## 2016-08-04 ENCOUNTER — Encounter (HOSPITAL_COMMUNITY): Payer: Medicare Other

## 2016-08-04 ENCOUNTER — Inpatient Hospital Stay (HOSPITAL_COMMUNITY): Payer: Medicare Other | Admitting: Anesthesiology

## 2016-08-04 ENCOUNTER — Encounter (HOSPITAL_COMMUNITY): Payer: Self-pay | Admitting: General Practice

## 2016-08-04 ENCOUNTER — Encounter (HOSPITAL_COMMUNITY): Admission: RE | Admit: 2016-08-04 | Payer: Medicare Other | Source: Ambulatory Visit

## 2016-08-04 ENCOUNTER — Encounter (HOSPITAL_COMMUNITY)
Admission: EM | Disposition: A | Discharge status: Home / Self Care | Payer: Self-pay | Source: Home / Self Care | Attending: Internal Medicine

## 2016-08-04 DIAGNOSIS — Z8249 Family history of ischemic heart disease and other diseases of the circulatory system: Secondary | ICD-10-CM

## 2016-08-04 DIAGNOSIS — D631 Anemia in chronic kidney disease: Secondary | ICD-10-CM | POA: Diagnosis present

## 2016-08-04 DIAGNOSIS — Z801 Family history of malignant neoplasm of trachea, bronchus and lung: Secondary | ICD-10-CM

## 2016-08-04 DIAGNOSIS — N186 End stage renal disease: Secondary | ICD-10-CM

## 2016-08-04 DIAGNOSIS — K219 Gastro-esophageal reflux disease without esophagitis: Secondary | ICD-10-CM

## 2016-08-04 DIAGNOSIS — Z87891 Personal history of nicotine dependence: Secondary | ICD-10-CM

## 2016-08-04 DIAGNOSIS — N2581 Secondary hyperparathyroidism of renal origin: Secondary | ICD-10-CM | POA: Diagnosis present

## 2016-08-04 DIAGNOSIS — N185 Chronic kidney disease, stage 5: Secondary | ICD-10-CM

## 2016-08-04 DIAGNOSIS — Z800 Family history of malignant neoplasm of digestive organs: Secondary | ICD-10-CM

## 2016-08-04 DIAGNOSIS — N189 Chronic kidney disease, unspecified: Secondary | ICD-10-CM | POA: Diagnosis present

## 2016-08-04 DIAGNOSIS — L8990 Pressure ulcer of unspecified site, unspecified stage: Secondary | ICD-10-CM | POA: Diagnosis present

## 2016-08-04 HISTORY — PX: AV FISTULA PLACEMENT: SHX1204

## 2016-08-04 HISTORY — PX: INSERTION OF DIALYSIS CATHETER: SHX1324

## 2016-08-04 LAB — RENAL FUNCTION PANEL
ALBUMIN: 2.8 g/dL — AB (ref 3.5–5.0)
Albumin: 3 g/dL — ABNORMAL LOW (ref 3.5–5.0)
Anion gap: 13 (ref 5–15)
Anion gap: 15 (ref 5–15)
BUN: 114 mg/dL — AB (ref 6–20)
BUN: 113 mg/dL — ABNORMAL HIGH (ref 6–20)
CALCIUM: 8.5 mg/dL — AB (ref 8.9–10.3)
CO2: 18 mmol/L — ABNORMAL LOW (ref 22–32)
CO2: 14 mmol/L — ABNORMAL LOW (ref 22–32)
CREATININE: 7.62 mg/dL — AB (ref 0.61–1.24)
CREATININE: 7.57 mg/dL — AB (ref 0.61–1.24)
Calcium: 8.6 mg/dL — ABNORMAL LOW (ref 8.9–10.3)
Chloride: 112 mmol/L — ABNORMAL HIGH (ref 101–111)
Chloride: 116 mmol/L — ABNORMAL HIGH (ref 101–111)
GFR calc Af Amer: 8 mL/min — ABNORMAL LOW (ref >60)
GFR calc non Af Amer: 7 mL/min — ABNORMAL LOW (ref >60)
GFR, EST AFRICAN AMERICAN: 8 mL/min — AB (ref >60)
GFR, EST NON AFRICAN AMERICAN: 7 mL/min — AB (ref >60)
Glucose, Bld: 83 mg/dL (ref 65–99)
Glucose, Bld: 98 mg/dL (ref 65–99)
PHOSPHORUS: 8.4 mg/dL — AB (ref 2.5–4.6)
Phosphorus: 8 mg/dL — ABNORMAL HIGH (ref 2.5–4.6)
Potassium: 3.4 mmol/L — ABNORMAL LOW (ref 3.5–5.1)
Potassium: 3.8 mmol/L (ref 3.5–5.1)
Sodium: 143 mmol/L (ref 135–145)
Sodium: 145 mmol/L (ref 135–145)

## 2016-08-04 LAB — CBC
HCT: 28.2 % — ABNORMAL LOW (ref 39.0–52.0)
HCT: 31.7 % — ABNORMAL LOW (ref 39.0–52.0)
HEMOGLOBIN: 9.7 g/dL — AB (ref 13.0–17.0)
Hemoglobin: 8.7 g/dL — ABNORMAL LOW (ref 13.0–17.0)
MCH: 29.7 pg (ref 26.0–34.0)
MCH: 29.8 pg (ref 26.0–34.0)
MCHC: 30.9 g/dL (ref 30.0–36.0)
MCHC: 30.6 g/dL (ref 30.0–36.0)
MCV: 96.2 fL (ref 78.0–100.0)
MCV: 97.2 fL (ref 78.0–100.0)
PLATELETS: 210 10*3/uL (ref 150–400)
PLATELETS: 209 10*3/uL (ref 150–400)
RBC: 2.93 MIL/uL — AB (ref 4.22–5.81)
RBC: 3.26 MIL/uL — AB (ref 4.22–5.81)
RDW: 13.8 % (ref 11.5–15.5)
RDW: 14 % (ref 11.5–15.5)
WBC: 6.1 10*3/uL (ref 4.0–10.5)
WBC: 6.3 10*3/uL (ref 4.0–10.5)

## 2016-08-04 LAB — PHOSPHORUS: PHOSPHORUS: 8.1 mg/dL — AB (ref 2.5–4.6)

## 2016-08-04 LAB — FERRITIN: FERRITIN: 83 ng/mL (ref 24–336)

## 2016-08-04 LAB — IRON AND TIBC
Iron: 78 ug/dL (ref 45–182)
Saturation Ratios: 23 % (ref 17.9–39.5)
TIBC: 333 ug/dL (ref 250–450)
UIBC: 255 ug/dL

## 2016-08-04 LAB — HEPATITIS B SURFACE ANTIGEN: Hepatitis B Surface Ag: NEGATIVE

## 2016-08-04 SURGERY — ARTERIOVENOUS (AV) FISTULA CREATION
Anesthesia: General | Laterality: Left

## 2016-08-04 MED ORDER — SUCCINYLCHOLINE CHLORIDE 20 MG/ML IJ SOLN
INTRAMUSCULAR | Status: DC | PRN
  Administered 2016-08-04: 100 mg via INTRAVENOUS

## 2016-08-04 MED ORDER — OXYCODONE HCL 5 MG PO TABS
5.0000 mg | ORAL_TABLET | Freq: Four times a day (QID) | ORAL | Status: DC | PRN
  Administered 2016-08-04 – 2016-08-05 (×2): 5 mg via ORAL
  Filled 2016-08-04 (×3): qty 1

## 2016-08-04 MED ORDER — SODIUM CHLORIDE 0.9 % IV SOLN: 100.0000 mL | INTRAVENOUS | Status: DC | PRN

## 2016-08-04 MED ORDER — ACETAMINOPHEN 325 MG PO TABS
650.0000 mg | ORAL_TABLET | Freq: Four times a day (QID) | ORAL | Status: DC | PRN
  Administered 2016-08-04 – 2016-08-07 (×4): 650 mg via ORAL
  Filled 2016-08-04 (×5): qty 2

## 2016-08-04 MED ORDER — DARBEPOETIN ALFA 60 MCG/0.3ML IJ SOSY
60.0000 ug | PREFILLED_SYRINGE | INTRAMUSCULAR | Status: DC
  Filled 2016-08-04: qty 0.3

## 2016-08-04 MED ORDER — SODIUM CHLORIDE 0.9 % IJ SOLN
INTRAMUSCULAR | Status: AC
  Filled 2016-08-04: qty 10

## 2016-08-04 MED ORDER — CALCIUM ACETATE (PHOS BINDER) 667 MG PO CAPS
667.0000 mg | ORAL_CAPSULE | Freq: Three times a day (TID) | ORAL | Status: DC
  Administered 2016-08-05 – 2016-08-08 (×7): 667 mg via ORAL
  Filled 2016-08-04 (×7): qty 1

## 2016-08-04 MED ORDER — LIDOCAINE-EPINEPHRINE (PF) 1 %-1:200000 IJ SOLN
INTRAMUSCULAR | Status: AC
  Filled 2016-08-04: qty 30

## 2016-08-04 MED ORDER — DEXTROSE 5 % IV SOLN
INTRAVENOUS | Status: DC | PRN
  Administered 2016-08-04: 25 ug/min via INTRAVENOUS

## 2016-08-04 MED ORDER — HEPARIN SODIUM (PORCINE) 1000 UNIT/ML IJ SOLN
INTRAMUSCULAR | Status: DC | PRN
  Administered 2016-08-04: 3000 [IU] via INTRAVENOUS

## 2016-08-04 MED ORDER — HEMOSTATIC AGENTS (NO CHARGE) OPTIME
TOPICAL | Status: DC | PRN
  Administered 2016-08-04: 1 via TOPICAL

## 2016-08-04 MED ORDER — 0.9 % SODIUM CHLORIDE (POUR BTL) OPTIME
TOPICAL | Status: DC | PRN
  Administered 2016-08-04: 1000 mL

## 2016-08-04 MED ORDER — ONDANSETRON HCL 4 MG/2ML IJ SOLN
INTRAMUSCULAR | Status: DC | PRN
  Administered 2016-08-04: 4 mg via INTRAVENOUS

## 2016-08-04 MED ORDER — LIDOCAINE-PRILOCAINE 2.5-2.5 % EX CREA
1.0000 "application " | TOPICAL_CREAM | CUTANEOUS | Status: DC | PRN
  Filled 2016-08-04: qty 5

## 2016-08-04 MED ORDER — LIDOCAINE 2% (20 MG/ML) 5 ML SYRINGE
INTRAMUSCULAR | Status: AC
  Filled 2016-08-04: qty 5

## 2016-08-04 MED ORDER — PROPOFOL 10 MG/ML IV BOLUS
INTRAVENOUS | Status: AC
  Filled 2016-08-04: qty 20

## 2016-08-04 MED ORDER — SODIUM CHLORIDE 0.9 % IV SOLN
INTRAVENOUS | Status: DC
  Administered 2016-08-04: 14:00:00 via INTRAVENOUS

## 2016-08-04 MED ORDER — PNEUMOCOCCAL VAC POLYVALENT 25 MCG/0.5ML IJ INJ
0.5000 mL | INJECTION | INTRAMUSCULAR | Status: AC
  Administered 2016-08-06: 0.5 mL via INTRAMUSCULAR
  Filled 2016-08-04 (×2): qty 0.5

## 2016-08-04 MED ORDER — ALTEPLASE 2 MG IJ SOLR
2.0000 mg | Freq: Once | INTRAMUSCULAR | Status: DC | PRN
  Filled 2016-08-04: qty 2

## 2016-08-04 MED ORDER — PROPOFOL 10 MG/ML IV BOLUS
INTRAVENOUS | Status: DC | PRN
  Administered 2016-08-04: 100 mg via INTRAVENOUS
  Administered 2016-08-04 (×2): 20 mg via INTRAVENOUS

## 2016-08-04 MED ORDER — HEPARIN SODIUM (PORCINE) 1000 UNIT/ML DIALYSIS
1000.0000 [IU] | INTRAMUSCULAR | Status: DC | PRN
  Administered 2016-08-05: 1000 [IU] via INTRAVENOUS_CENTRAL
  Filled 2016-08-04: qty 1

## 2016-08-04 MED ORDER — PENTAFLUOROPROP-TETRAFLUOROETH EX AERO
1.0000 "application " | INHALATION_SPRAY | CUTANEOUS | Status: DC | PRN
  Filled 2016-08-04: qty 30

## 2016-08-04 MED ORDER — SODIUM CHLORIDE 0.9 % IV SOLN
INTRAVENOUS | Status: DC | PRN
  Administered 2016-08-04: 500 mL

## 2016-08-04 MED ORDER — IOPAMIDOL (ISOVUE-300) INJECTION 61%
INTRAVENOUS | Status: AC
  Filled 2016-08-04: qty 50

## 2016-08-04 MED ORDER — EPHEDRINE SULFATE-NACL 50-0.9 MG/10ML-% IV SOSY
PREFILLED_SYRINGE | INTRAVENOUS | Status: DC | PRN
  Administered 2016-08-04 (×5): 5 mg via INTRAVENOUS

## 2016-08-04 MED ORDER — HEPARIN SODIUM (PORCINE) 1000 UNIT/ML IJ SOLN
INTRAMUSCULAR | Status: AC
  Filled 2016-08-04: qty 1

## 2016-08-04 MED ORDER — CEFUROXIME SODIUM 1.5 G IV SOLR
INTRAVENOUS | Status: AC
  Filled 2016-08-04: qty 1.5

## 2016-08-04 MED ORDER — DEXTROSE 5 % IV SOLN
INTRAVENOUS | Status: DC | PRN
  Administered 2016-08-04: 1.5 g via INTRAVENOUS

## 2016-08-04 MED ORDER — FENTANYL CITRATE (PF) 250 MCG/5ML IJ SOLN
INTRAMUSCULAR | Status: AC
  Filled 2016-08-04: qty 5

## 2016-08-04 MED ORDER — HEPARIN SODIUM (PORCINE) 1000 UNIT/ML IJ SOLN
INTRAMUSCULAR | Status: DC | PRN
  Administered 2016-08-04: 1.9 [IU]

## 2016-08-04 MED ORDER — LIDOCAINE HCL (PF) 1 % IJ SOLN
5.0000 mL | INTRAMUSCULAR | Status: DC | PRN
Start: 2016-08-04 — End: 2016-08-08
  Filled 2016-08-04: qty 5

## 2016-08-04 MED ORDER — PROTAMINE SULFATE 10 MG/ML IV SOLN
INTRAVENOUS | Status: DC | PRN
  Administered 2016-08-04: 25 mg via INTRAVENOUS

## 2016-08-04 MED ORDER — EPHEDRINE 5 MG/ML INJ
INTRAVENOUS | Status: AC
  Filled 2016-08-04: qty 10

## 2016-08-04 MED ORDER — MIDAZOLAM HCL 2 MG/2ML IJ SOLN
INTRAMUSCULAR | Status: AC
  Filled 2016-08-04: qty 2

## 2016-08-04 MED ORDER — FENTANYL CITRATE (PF) 100 MCG/2ML IJ SOLN: 25.0000 ug | INTRAMUSCULAR | Status: DC | PRN

## 2016-08-04 MED ORDER — PHENYLEPHRINE HCL 10 MG/ML IJ SOLN
INTRAMUSCULAR | Status: DC | PRN
  Administered 2016-08-04 (×2): 80 ug via INTRAVENOUS
  Administered 2016-08-04: 40 ug via INTRAVENOUS
  Administered 2016-08-04: 80 ug via INTRAVENOUS
  Administered 2016-08-04: 40 ug via INTRAVENOUS

## 2016-08-04 MED ORDER — DARBEPOETIN ALFA 60 MCG/0.3ML IJ SOSY
60.0000 ug | PREFILLED_SYRINGE | INTRAMUSCULAR | Status: DC
  Administered 2016-08-05: 60 ug via SUBCUTANEOUS
  Filled 2016-08-04: qty 0.3

## 2016-08-04 MED ORDER — SODIUM BICARBONATE 650 MG PO TABS
650.0000 mg | ORAL_TABLET | Freq: Every day | ORAL | Status: DC
  Administered 2016-08-05 – 2016-08-08 (×4): 650 mg via ORAL
  Filled 2016-08-04 (×4): qty 1

## 2016-08-04 MED ORDER — ONDANSETRON HCL 4 MG/2ML IJ SOLN
INTRAMUSCULAR | Status: AC
  Filled 2016-08-04: qty 2

## 2016-08-04 MED ORDER — FENTANYL CITRATE (PF) 100 MCG/2ML IJ SOLN
INTRAMUSCULAR | Status: DC | PRN
  Administered 2016-08-04: 25 ug via INTRAVENOUS
  Administered 2016-08-04: 100 ug via INTRAVENOUS
  Administered 2016-08-04 (×5): 25 ug via INTRAVENOUS

## 2016-08-04 SURGICAL SUPPLY — 56 items
ADH SKN CLS APL DERMABOND .7 (GAUZE/BANDAGES/DRESSINGS) ×2
ARMBAND PINK RESTRICT EXTREMIT (MISCELLANEOUS) ×3 IMPLANT
BAG DECANTER FOR FLEXI CONT (MISCELLANEOUS) ×2 IMPLANT
BIOPATCH RED 1 DISK 7.0 (GAUZE/BANDAGES/DRESSINGS) ×2 IMPLANT
CANISTER SUCT 3000ML PPV (MISCELLANEOUS) ×2 IMPLANT
CATH PALINDROME RT-P 15FX19CM (CATHETERS) IMPLANT
CATH PALINDROME RT-P 15FX23CM (CATHETERS) IMPLANT
CATH PALINDROME RT-P 15FX28CM (CATHETERS) ×1 IMPLANT
CATH PALINDROME RT-P 15FX55CM (CATHETERS) IMPLANT
CLIP TI MEDIUM 6 (CLIP) ×2 IMPLANT
CLIP TI WIDE RED SMALL 6 (CLIP) ×2 IMPLANT
COVER PROBE W GEL 5X96 (DRAPES) ×3 IMPLANT
COVER SURGICAL LIGHT HANDLE (MISCELLANEOUS) ×2 IMPLANT
DERMABOND ADVANCED (GAUZE/BANDAGES/DRESSINGS) ×2
DERMABOND ADVANCED .7 DNX12 (GAUZE/BANDAGES/DRESSINGS) ×1 IMPLANT
DRAPE C-ARM 42X72 X-RAY (DRAPES) ×2 IMPLANT
DRAPE CHEST BREAST 15X10 FENES (DRAPES) ×2 IMPLANT
DRSG COVADERM 4X6 (GAUZE/BANDAGES/DRESSINGS) ×1 IMPLANT
ELECT CAUTERY BLADE 6.4 (BLADE) ×1 IMPLANT
ELECT REM PT RETURN 9FT ADLT (ELECTROSURGICAL) ×2
ELECTRODE REM PT RTRN 9FT ADLT (ELECTROSURGICAL) ×1 IMPLANT
GAUZE SPONGE 2X2 8PLY STRL LF (GAUZE/BANDAGES/DRESSINGS) IMPLANT
GAUZE SPONGE 4X4 16PLY XRAY LF (GAUZE/BANDAGES/DRESSINGS) ×2 IMPLANT
GLOVE BIOGEL PI IND STRL 7.5 (GLOVE) ×1 IMPLANT
GLOVE BIOGEL PI INDICATOR 7.5 (GLOVE) ×1
GLOVE SURG SS PI 7.5 STRL IVOR (GLOVE) ×2 IMPLANT
GOWN STRL REUS W/ TWL LRG LVL3 (GOWN DISPOSABLE) ×2 IMPLANT
GOWN STRL REUS W/ TWL XL LVL3 (GOWN DISPOSABLE) ×1 IMPLANT
GOWN STRL REUS W/TWL LRG LVL3 (GOWN DISPOSABLE) ×6
GOWN STRL REUS W/TWL XL LVL3 (GOWN DISPOSABLE) ×4
HEMOSTAT SNOW SURGICEL 2X4 (HEMOSTASIS) ×1 IMPLANT
KIT BASIN OR (CUSTOM PROCEDURE TRAY) ×2 IMPLANT
KIT ROOM TURNOVER OR (KITS) ×2 IMPLANT
NDL 18GX1X1/2 (RX/OR ONLY) (NEEDLE) ×1 IMPLANT
NDL HYPO 25GX1X1/2 BEV (NEEDLE) ×1 IMPLANT
NEEDLE 18GX1X1/2 (RX/OR ONLY) (NEEDLE) ×2 IMPLANT
NEEDLE HYPO 25GX1X1/2 BEV (NEEDLE) ×2 IMPLANT
NS IRRIG 1000ML POUR BTL (IV SOLUTION) ×2 IMPLANT
PACK CV ACCESS (CUSTOM PROCEDURE TRAY) ×2 IMPLANT
PACK SURGICAL SETUP 50X90 (CUSTOM PROCEDURE TRAY) ×2 IMPLANT
PAD ARMBOARD 7.5X6 YLW CONV (MISCELLANEOUS) ×4 IMPLANT
SOAP 2 % CHG 4 OZ (WOUND CARE) ×2 IMPLANT
SPONGE GAUZE 2X2 STER 10/PKG (GAUZE/BANDAGES/DRESSINGS) ×1
SUT ETHILON 3 0 PS 1 (SUTURE) ×2 IMPLANT
SUT PROLENE 6 0 CC (SUTURE) ×3 IMPLANT
SUT VIC AB 3-0 SH 27 (SUTURE) ×2
SUT VIC AB 3-0 SH 27X BRD (SUTURE) ×1 IMPLANT
SUT VICRYL 4-0 PS2 18IN ABS (SUTURE) ×2 IMPLANT
SYR 10ML LL (SYRINGE) ×2 IMPLANT
SYR 20CC LL (SYRINGE) ×4 IMPLANT
SYR 5ML LL (SYRINGE) ×2 IMPLANT
SYR CONTROL 10ML LL (SYRINGE) ×2 IMPLANT
TOWEL OR 17X24 6PK STRL BLUE (TOWEL DISPOSABLE) ×2 IMPLANT
TOWEL OR 17X26 10 PK STRL BLUE (TOWEL DISPOSABLE) ×2 IMPLANT
UNDERPAD 30X30 (UNDERPADS AND DIAPERS) ×2 IMPLANT
WATER STERILE IRR 1000ML POUR (IV SOLUTION) ×2 IMPLANT

## 2016-08-04 NOTE — Transfer of Care (Signed)
Immediate Anesthesia Transfer of Care Note  Patient: Richard Bush  Procedure(s) Performed: Procedure(s): ARTERIOVENOUS (AV) FISTULA CREATION/LEFT ARM (Left) INSERTION OF DIALYSIS CATHETER (Left)  Patient Location: PACU  Anesthesia Type:General  Level of Consciousness: awake, alert  and oriented  Airway & Oxygen Therapy: Patient Spontanous Breathing and Patient connected to nasal cannula oxygen  Post-op Assessment: Report given to RN and Post -op Vital signs reviewed and stable  Post vital signs: Reviewed and stable  Last Vitals:  Vitals:   08/04/16 0851 08/04/16 1703  BP: 128/73   Pulse: 81   Resp: 17   Temp: 37 C 36.6 C    Last Pain:  Vitals:   08/04/16 0851  TempSrc: Oral  PainSc:          Complications: No apparent anesthesia complications

## 2016-08-04 NOTE — Progress Notes (Signed)
Pt. transported via stretcher from ER to 6E-29- alert and oriented x4; wife with pt; oriented to room and call button; reviewed orders and POC with pt./wife.

## 2016-08-04 NOTE — Progress Notes (Addendum)
   Subjective: He reports improvement in his symptoms overnight. He did have some nausea while sitting up in the chair on the side of his bed but the symptoms improved when he went back to reclining. He went for pain mapping this morning and is preparing for OR dialysis catheter placement with [facility] when seen this afternoon.   Objective:  Vital signs in last 24 hours: Vitals:   08/03/16 1930 08/03/16 2148 08/04/16 0431 08/04/16 0851  BP: 135/60 (!) 143/67 125/62 128/73  Pulse: 81 78 86 81  Resp: 13 14 16 17   Temp:  98.4 F (36.9 C) 98.6 F (37 C) 98.6 F (37 C)  TempSrc:  Oral Oral Oral  SpO2: 95% 97% 95% 97%  Weight:  (!) 319 lb 3.2 oz (144.8 kg)    Height:       Physical Exam  Constitutional: He is oriented to person, place, and time and well-developed, well-nourished, and in no distress. No distress.  HENT:  Head: Normocephalic and atraumatic.  Eyes: Conjunctivae are normal. Right eye exhibits no discharge. Left eye exhibits no discharge. No scleral icterus.  Pulmonary/Chest: Effort normal.  Abdominal: He exhibits distension.  Neurological: He is alert and oriented to person, place, and time.  Skin: He is not diaphoretic.  Psychiatric: Affect and judgment normal.   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 He went for vein mapping today and has started the clip process. - Nephrology is following, we appreciate their recommendations  -Follow-up hepatitis B surface antigen -Calcitriol started for metabolic bone disease - trend BMP  - increased home med lasix 80 mg twice daily while awaiting dialysis  -zofran PRN  -cardiac monitoring  -monitoring daily weights   Hyperphosphatemia  Phos 8.1    - starting phoslo TID with meals   Low bicarbonate Started sodium bicarbonate supplementation today  Normocytic Anemia Hemoglobin 8.7. - Follow-up ferritin and TIBC - aranesp IV every 14 days  Chronic gout Stable Continue Oxistat 40 mg every other day  Hypertension Blood pressure well controlled today Continue home meds hydralazine 50 mg 3 times daily  History of GERD Stable Continue home med famotidine 20 mg twice daily   New RBBB  Currently on cardiac monitor which will be mailed back on Thursday   Dispo: Anticipated discharge in approximately 3-5 day(s).   Ledell Noss, MD 08/04/2016, 11:25 AM Pager: [phone]

## 2016-08-04 NOTE — Plan of Care (Signed)
Problem: Education: Goal: Knowledge of [facility] Education information/materials will improve Outcome: Progressing POC reviewed with pt./wife and orders.

## 2016-08-04 NOTE — Op Note (Signed)
    Patient name: Richard Bush MRN: [id number] DOB: 1950/08/06 Sex: male  08/03/2016 - 08/04/2016 Pre-operative Diagnosis: ESRD Post-operative diagnosis:  Same Surgeon:  [name] Assistants:  Leontine Locket Procedure:    #1Ultrasound-guided placement of a right internal jugular vein 27 cm Pallindrome catheter   #2: Left brachio-cephalic AV fistula Anesthesia:  Gen. Blood Loss:  See anesthesia record Specimens:  None  Findings:  Excellent vein and artery  Indications:  The patient is now needing dialysis.  We discussed proceeding with the fistula and catheter.  Procedure:  The patient was identified in the holding area and taken to [place] 11  The patient was then placed supine on the table. general anesthesia was administered.  The patient was prepped and draped in the usual sterile fashion.  A time out was called and antibiotics were administered.  Ultrasound was used to evaluate the right internal jugular vein which was widely patent and easily compressible.  A #11 blade was used to make a skin nick.  Under ultrasound guidance, an 18-gauge needle was used to cannulate the right internal jugular vein.  An 035 wire was then advanced into the inferior vena cava, confirmed by fluoroscopy.  The subcutaneous tract was dilated with sequential dilators and peel-away sheath was placed.  A 27 cm Pallindrome catheter was then inserted in the peel-away sheath was removed.  A skin exit site was selected and a skin nick was made with an 11 blade.  A subcutaneous tunnel was created.  The cath was brought through the tunnel and the cuff was situated at the skin exit site.  The catheter was then connected to the port.  Both ports flushed and aspirated without difficulty.  Fluoroscopy was used to confirm that the catheter tip was in good position and that there were no kinks.  The catheter was sutured into position with 3-0 nylon.  The neck incision was closed with 4-0 Vicryl and Dermabond.  I then evaluated  the left cephalic vein with u/s.  I felt the upper arm vein was the most appropriate vein.  A transverse incision was made at the antecubital crease.  The cephalic vein was dissected free, ligating all side branches.  It measured at least 13mm throughout.I then exposed thebracjial artery.  It was 59mm and disease free.  3000 units of heparin were given.  The vein was ligated distally with 2-0 silk.  It distended nicely with saline.  The artery was then occluded with clamps.  A 11 blade was used to make an arteriotomy which was extended longitudinally.  The vein was cut to the appropriate length and spatulated.  A running end to side anstamosis was created with 6-0 prolene.  Prior to completion, the appropriate flushing maneuvers were completed.  There was an excellent thrill in the vein.  He had a faintly palpable radial pulse after completion.  25 mg of protamine was given.  Once hemostasis was good, the incision was closed in 2 layers of 3-0 vicryl followed by dermabond    Disposition:  To PACU stable   V. [name], M.D. [facility] Office: [phone] Pager:  [phone]

## 2016-08-04 NOTE — Progress Notes (Signed)
  [facility] Progress Note   Assessment/ Plan:   1.  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  Pt having progressive symptoms of uremia and no improvement of Scr after discontinuation of ARB.  Investigation into secondary causes of progressive CKD unrevealing.  Have discussed with pt and family of the need to initiate dialysis.  In agreement.  s/p vein mapping, VVS c/s today, hopefully access placement tomorrow with HD #1.  2.  N/v: Most likely uremia; relieved a little with Zofran.  Per primary but expect to improve with dialysis.  3.  HTN: On hydralazine 50 mg TID  4.  Volume: some increasing edema; would increase back to Lasix 80 mg BID while awaiting initiation of HD  5.  New RBBB: cardiac monitor to be mailed back Thursday; wife aware; TTE performed in [place]  6. Gout: on Uloric 40 mg Qod.  7.  Anemia: Aranesp 60 mcg q Tuesday  8.  Bone/ mineral: continue calcitriol 0.25 mcg daily, phos 8.1, will start phoslo 667 mg TID WC; this is the cheapest binder (usually); prefer a noncalcium based binder when Medicare for ESRD has been sorted out  Subjective:    Still nauseated- doesn't want a lot of food.   Objective:   BP 128/73 (BP Location: Left Arm)   Pulse 81   Temp 98.6 F (37 C) (Oral)   Resp 17   Ht 5\' 11"  (1.803 m)   Wt (!) 144.8 kg (319 lb 3.2 oz)   SpO2 97%   BMI 44.52 kg/m   Physical Exam: GEN older gentleman, appears more comfortable today, no hiccups HEENT EOMI, PERRL, MMM NECK no JVD, thryomegaly, or carotid bruits; thick neck PULM normal WOB, clear anteriorly, diminished in bases bilaterally CV RRR, no m/r/g ABD benign today EXT 1+ LE edema NEURO nonfocal SKIN no rashes or lesions MSK: no active gout flare at this time  Labs: BMET  Recent Labs Lab 08/03/16 0938 08/04/16 0543 08/04/16 1156  NA 143 143  --   K 3.6 3.4*  --   CL 111 112*  --   CO2 17* 18*  --   GLUCOSE 104* 83  --   BUN 110* 114*  --    CREATININE 7.90* 7.62*  --   CALCIUM 8.7* 8.5*  --   PHOS  --  8.4* 8.1*   CBC  Recent Labs Lab 08/03/16 0938 08/04/16 0543  WBC 5.9 6.1  HGB 9.9* 8.7*  HCT 31.7* 28.2*  MCV 95.8 96.2  PLT 225 210    @IMGRELPRIORS @ Medications:    . calcitRIOL  0.25 mcg Oral q morning - 10a  . Darbepoetin Alfa  60 mcg Subcutaneous Q14 Days  . famotidine  20 mg Oral BID  . febuxostat  40 mg Oral QODAY  . furosemide  80 mg Oral BID  . heparin  5,000 Units Subcutaneous Q8H  . hydrALAZINE  50 mg Oral TID  . loratadine  10 mg Oral Daily  . omega-3 acid ethyl esters  2 g Oral BID  . [START ON 08/05/2016] pneumococcal 23 valent vaccine  0.5 mL Intramuscular Tomorrow-1000  . simvastatin  20 mg Oral q morning - 10a  . sodium chloride flush  3 mL Intravenous Q12H     Madelon Lips MD [facility] pgr [phone] 08/04/2016, 12:54 PM

## 2016-08-04 NOTE — Anesthesia Postprocedure Evaluation (Signed)
Anesthesia Post Note  Patient: [name]  Procedure(s) Performed: Procedure(s) (LRB): ARTERIOVENOUS (AV) FISTULA CREATION/LEFT ARM (Left) INSERTION OF DIALYSIS CATHETER (Left)     Patient location during evaluation: PACU Anesthesia Type: General Level of consciousness: awake and alert Pain management: pain level controlled Vital Signs Assessment: post-procedure vital signs reviewed and stable Respiratory status: spontaneous breathing, nonlabored ventilation, respiratory function stable and patient connected to nasal cannula oxygen Cardiovascular status: blood pressure returned to baseline and stable Postop Assessment: no signs of nausea or vomiting Anesthetic complications: no    Last Vitals:  Vitals:   08/04/16 1719 08/04/16 1730  BP: 134/65   Pulse: 91   Resp: 13   Temp:  36.6 C    Last Pain:  Vitals:   08/04/16 0851  TempSrc: Oral  PainSc:                  Khiem Gargis,W. EDMOND

## 2016-08-04 NOTE — Progress Notes (Signed)
Upper Extremity Vein Mapping  Right Cephalic  Segment Diameter Depth Comment  1. Axilla 5.41mm 12.62mm   2. Mid upper arm 5.45mm 7.35mm   3. Above [facility] 5.16mm 7.21mm   4. In [facility] 5.34mm 4.52mm   5. Below AC 3.43mm 8.38mm   6. Mid forearm 3.75mm 5.2mm   7. Wrist 2.56mm 2.36mm                     Left Cephalic  Segment Diameter Depth Comment  1. Axilla 36mm 18.37mm   2. Mid upper arm 4.11mm 4.15mm   3. Above [facility] 4.3mm 7.68mm   4. In [facility] 6.71mm 5.39mm   5. Below AC 3.40mm 10.77mm   6. Mid forearm 2.109mm 7.96mm Branch  7. Wrist 1.39mm 1.2mm                   08/04/2016 9:40 AM Maudry Mayhew, BS, RVT, RDCS, RDMS

## 2016-08-04 NOTE — Anesthesia Preprocedure Evaluation (Signed)
Anesthesia Evaluation  Patient identified by MRN, date of birth, ID band Patient awake    Reviewed: Allergy & Precautions, NPO status , Patient's Chart, lab work & pertinent test results  History of Anesthesia Complications Negative for: history of anesthetic complications  Airway Mallampati: I  TM Distance: >3 FB Neck ROM: Full    Dental  (+) Teeth Intact   Pulmonary sleep apnea , former smoker,    breath sounds clear to auscultation       Cardiovascular hypertension, Pt. on medications + dysrhythmias  Rhythm:Regular     Neuro/Psych negative neurological ROS  negative psych ROS   GI/Hepatic Neg liver ROS, GERD  ,  Endo/Other  Morbid obesity  Renal/GU CRFRenal disease     Musculoskeletal  (+) Arthritis ,   Abdominal   Peds  Hematology  (+) anemia ,   Anesthesia Other Findings   Reproductive/Obstetrics                             Anesthesia Physical Anesthesia Plan  ASA: IV  Anesthesia Plan: General   Post-op Pain Management:    Induction: Intravenous  PONV Risk Score and Plan: 2 and Ondansetron  Airway Management Planned: Oral ETT  Additional Equipment: None  Intra-op Plan:   Post-operative Plan: Extubation in OR and Possible Post-op intubation/ventilation  Informed Consent: I have reviewed the patients History and Physical, chart, labs and discussed the procedure including the risks, benefits and alternatives for the proposed anesthesia with the patient or authorized representative who has indicated his/her understanding and acceptance.   Dental advisory given  Plan Discussed with: CRNA and Surgeon  Anesthesia Plan Comments:         Anesthesia Quick Evaluation

## 2016-08-04 NOTE — Consult Note (Signed)
Hospital Consult    Reason for Consult:  In need of [facility] and permanent HD access Requesting Physician:  Hollie Salk MRN #:  [id number]   History of Present Illness: This is a 66 y.o. 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 [name] at [place] since 2007.  He presented to the hospital yesterday with c/o N/V/D, HA, shaking, chills and decreased appetite with a metallic taste.  He states he has had to sleep on a couple of pillows lately.  He does have sleep apnea.    The pt is on a statin for cholesterol management.  He is on hydralazine & lasix for blood pressure management.  He takes colchicine for gout.  He takes Zantac for reflux.    Past Medical History:  Diagnosis Date  . Anemia   . Arthritis   . Chronic kidney disease   . GERD (gastroesophageal reflux disease)   . Gout   . Hypertension     Past Surgical History:  Procedure Laterality Date  . APPENDECTOMY  1963  . CARPAL TUNNEL RELEASE Bilateral 2006 right and 2007 left   . CHOLECYSTECTOMY  1979  . FOOT SURGERY Left 1981  . JOINT REPLACEMENT Right 2008   . JOINT REPLACEMENT Left 2010  . LUMBAR LAMINECTOMY/DECOMPRESSION MICRODISCECTOMY N/A 08/18/2012   Procedure: LUMBAR LAMINECTOMY/DECOMPRESSION MICRODISCECTOMY 3 LEVELS;  Surgeon: Hosie Spangle, MD;  Location: [place] NEURO ORS;  Service: Neurosurgery;  Laterality: N/A;  Lumbar Two through Five Laminectomy  . SHOULDER ARTHROSCOPY WITH ROTATOR CUFF REPAIR Right 2001  . TOTAL HIP ARTHROPLASTY Right 07/26/2013   Procedure: TOTAL HIP ARTHROPLASTY;  Surgeon: Kerin Salen, MD;  Location: [facility];  Service: Orthopedics;  Laterality: Right;  . WRIST TENDON TRANSFER Bilateral 1972 left and 1973 right    Allergies  Allergen Reactions  . Altace [Ramipril] Cough    Chronic cough  . Cholestyramine Light Other (See Comments)    unspecified  . Cozaar [Losartan Potassium] Other (See Comments)    Stopped due to kidney decline 4/18  . Kayexalate [Polystyrene] Other  (See Comments)    Abnormal ekg  . Other Swelling    "Symvisc"   . Sulfa Antibiotics Rash    Prior to Admission medications   Medication Sig Start Date End Date Taking? Authorizing Provider  acetaminophen (TYLENOL) 500 MG tablet Take 500 mg by mouth 4 (four) times daily.   Yes [provider]  calcitRIOL (ROCALTROL) 0.25 MCG capsule Take 0.25 mcg by mouth every morning.   Yes [provider]  cetirizine (ZYRTEC) 10 MG tablet Take 10 mg by mouth at bedtime.   Yes [provider]  colchicine 0.6 MG tablet Take 1 tablet (0.6 mg total) by mouth daily as needed (for gout flareups). Patient taking differently: Take 0.3 mg by mouth daily as needed (for gout flareups).  07/15/16 10/13/16 Yes Deveshwar, Abel Presto, MD  febuxostat (ULORIC) 40 MG tablet Take 1 tablet (40 mg total) by mouth daily. Patient taking differently: Take 40 mg by mouth every other day.  01/16/16 08/03/16 Yes Panwala, Naitik, PA-C  furosemide (LASIX) 80 MG tablet Take 80 mg by mouth every morning. Takes 80mg  at breakfast, and 80mg  at lunch, daily    Yes [provider]  hydrALAZINE (APRESOLINE) 25 MG tablet Take 50 mg by mouth 3 (three) times daily.    Yes [provider]  loperamide (IMODIUM) 2 MG capsule Take by mouth as needed for diarrhea or loose stools.   Yes [provider]  omega-3 acid ethyl esters (LOVAZA) 1 G capsule Take 2 g by mouth 2 (two) times daily.   Yes [provider]  ranitidine (ZANTAC) 150 MG tablet Take 150 mg by mouth 2 (two) times daily.   Yes [provider]  simvastatin (ZOCOR) 20 MG tablet Take 20 mg by mouth every morning.   Yes [provider]  oxyCODONE-acetaminophen (ROXICET) 5-325 MG per tablet Take 1 tablet by mouth every 4 (four) hours as needed. Patient not taking: Reported on 07/15/2016 07/26/13   Leighton Parody, PA-C    Social History   Social History  . Marital status: Married    Spouse name: N/A  . Number of  children: N/A  . Years of education: N/A   Occupational History  . Not on file.   Social History Main Topics  . Smoking status: Former Smoker    Packs/day: 1.00    Years: 10.00    Types: Cigarettes    Quit date: 08/13/1982  . Smokeless tobacco: Former Systems developer  . Alcohol use Yes     Comment: rarely  . Drug use: No  . Sexual activity: Not on file   Other Topics Concern  . Not on file   Social History Narrative  . No narrative on file     Family History  Problem Relation Age of Onset  . Heart disease Mother   . Cancer Father     ROS: [x]  Positive   [ ]  Negative   [ ]  All sytems reviewed and are negative  Cardiac: []  chest pain/pressure []  palpitations []  SOB lying flat []  DOE  Vascular: []  pain in legs while walking []  pain in legs at rest []  pain in legs at night []  non-healing ulcers []  hx of DVT []  swelling in legs  Pulmonary: []  productive cough []  asthma/wheezing []  home O2  Neurologic: []  weakness in []  arms []  legs []  numbness in []  arms []  legs []  hx of CVA []  mini stroke [] difficulty speaking or slurred speech []  temporary loss of vision in one eye []  dizziness [x]  headache  Hematologic: []  hx of cancer []  bleeding problems []  problems with blood clotting easily  Endocrine:   []  diabetes []  thyroid disease  GI []  vomiting blood []  blood in stool [x]  reflux  GU: [x]  CKD/renal failure []  HD--[]  M/W/F or []  T/T/S []  burning with urination []  blood in urine  Psychiatric: []  anxiety []  depression  Musculoskeletal: [x]  arthritis []  joint pain [x]  gout  Integumentary: []  rashes []  ulcers  Constitutional: [x]  fever [x]  chills [x]  diarrhea [x]  loss of appetite  Physical Examination  Vitals:   08/04/16 0431 08/04/16 0851  BP: 125/62 128/73  Pulse: 86 81  Resp: 16 17  Temp: 98.6 F (37 C) 98.6 F (37 C)   Body mass index is 44.52 kg/m.  General:  WDWN in NAD Gait: Not observed HENT: WNL, normocephalic Pulmonary:  normal non-labored breathing, without Rales, rhonchi,  wheezing Cardiac: regular, without  Murmurs, rubs or gallops; without carotid bruits Abdomen: obese soft, NT/ND, no masses Skin: without rashes Vascular Exam/Pulses:  Right Left  Radial 2+ (normal) 2+ (normal)  Ulnar Unable to palpate  Unable to palpate   DP 2+ (normal) 2+ (normal)  PT Unable to palpate  Unable to palpate    Extremities: without ischemic changes, without Gangrene , without cellulitis; without open wounds;  Swelling BLE Musculoskeletal: no muscle wasting or atrophy  Neurologic: A&O X 3;  No focal weakness or  paresthesias are detected; speech is fluent/normal Psychiatric:  The pt has Normal affect.   CBC    Component Value Date/Time   WBC 6.1 08/04/2016 0543   RBC 2.93 (L) 08/04/2016 0543   HGB 8.7 (L) 08/04/2016 0543   HGB 9.8 (L) 07/15/2016 1320   HCT 28.2 (L) 08/04/2016 0543   HCT 31.1 (L) 07/15/2016 1320   PLT 210 08/04/2016 0543   PLT 186 07/15/2016 1320   MCV 96.2 08/04/2016 0543   MCV 96 07/15/2016 1320   MCH 29.7 08/04/2016 0543   MCHC 30.9 08/04/2016 0543   RDW 13.8 08/04/2016 0543   RDW 15.1 07/15/2016 1320   LYMPHSABS 1.7 07/15/2016 1320   MONOABS 0.4 07/18/2013 1248   EOSABS 0.3 07/15/2016 1320   BASOSABS 0.0 07/15/2016 1320    BMET    Component Value Date/Time   NA 143 08/04/2016 0543   NA 145 (H) 07/15/2016 1316   K 3.4 (L) 08/04/2016 0543   CL 112 (H) 08/04/2016 0543   CO2 18 (L) 08/04/2016 0543   GLUCOSE 83 08/04/2016 0543   BUN 114 (H) 08/04/2016 0543   BUN 92 (HH) 07/15/2016 1316   CREATININE 7.62 (H) 08/04/2016 0543   CALCIUM 8.5 (L) 08/04/2016 0543   GFRNONAA 7 (L) 08/04/2016 0543   GFRAA 8 (L) 08/04/2016 0543    COAGS: Lab Results  Component Value Date   INR 1.04 07/18/2013   INR 2.1 (H) 03/08/2008   INR 1.3 03/07/2008     Non-Invasive Vascular Imaging:   Bilateral Upper Extremity Vein Mapping 6/44/03:  Right Cephalic Segment Diameter Depth Comment  1.  Axilla 5.8mm 12.96mm   2. Mid upper arm 5.23mm 7.4mm   3. Above [facility] 5.91mm 7.82mm   4. In [facility] 5.52mm 4.52mm   5. Below AC 3.56mm 8.47mm   6. Mid forearm 3.50mm 5.60mm   7. Wrist 2.17mm 4.74QV    Left Cephalic Segment Diameter Depth Comment  1. Axilla 1mm 18.17mm   2. Mid upper arm 4.63mm 4.64mm   3. Above [facility] 4.29mm 7.60mm   4. In [facility] 6.28mm 5.35mm   5. Below AC 3.25mm 10.29mm   6. Mid forearm 2.83mm 7.50mm Branch  7. Wrist 1.21mm 1.71mm      Statin:  Yes.   Beta Blocker:  No. Aspirin:  No. ACEI:  No. ARB:  No. CCB use:  No Other antiplatelets/anticoagulants:  Yes.   SQ heparin (DVT prophylaxis)   ASSESSMENT/PLAN: This is a 66 y.o. male with progressive CKD to ESRD in need of permanent dialysis access and TDC placement.   -pt is right hand dominant-his veins are adequate for left arm fistula. -restrict left upper extremity -Dr. Trula Slade to see pt this afternoon. -pt is npo-there is no room on the schedule for the rest of the week.  We have an OR this afternoon.  Will take to OR today for access and [facility] placement.   Leontine Locket, PA-C [facility] [phone]   I agree with the above.  I have seen and examined the patient.  We will proceed with left arm fistula and catheter.  I discussed the risks and benefits including non-maturity, the need for future operations, and steal.  We will proceed today as he ahs been NPO  [name] Teaghan Melrose

## 2016-08-04 NOTE — Anesthesia Procedure Notes (Signed)
Procedure Name: Intubation Date/Time: 08/04/2016 2:51 PM Performed by: Merrilyn Puma B Pre-anesthesia Checklist: Patient identified, Emergency Drugs available, Suction available, Patient being monitored and Timeout performed Patient Re-evaluated:Patient Re-evaluated prior to inductionOxygen Delivery Method: Circle system utilized Preoxygenation: Pre-oxygenation with 100% oxygen Intubation Type: IV induction Ventilation: Mask ventilation without difficulty and Oral airway inserted - appropriate to patient size Laryngoscope Size: Mac and 4 Grade View: Grade III Tube type: Oral Number of attempts: 1 Airway Equipment and Method: Stylet Placement Confirmation: ETT inserted through vocal cords under direct vision,  positive ETCO2,  CO2 detector and breath sounds checked- equal and bilateral Secured at: 24 cm Tube secured with: Tape Dental Injury: Teeth and Oropharynx as per pre-operative assessment

## 2016-08-05 ENCOUNTER — Encounter (HOSPITAL_COMMUNITY): Payer: Self-pay | Admitting: Surgery

## 2016-08-05 ENCOUNTER — Telehealth: Payer: Self-pay | Admitting: Surgery

## 2016-08-05 DIAGNOSIS — M1A30X0 Chronic gout due to renal impairment, unspecified site, without tophus (tophi): Secondary | ICD-10-CM

## 2016-08-05 DIAGNOSIS — E872 Acidosis: Secondary | ICD-10-CM

## 2016-08-05 LAB — CBC
HEMATOCRIT: 29.4 % — AB (ref 39.0–52.0)
Hemoglobin: 8.9 g/dL — ABNORMAL LOW (ref 13.0–17.0)
MCH: 29.5 pg (ref 26.0–34.0)
MCHC: 30.3 g/dL (ref 30.0–36.0)
MCV: 97.4 fL (ref 78.0–100.0)
Platelets: 202 10*3/uL (ref 150–400)
RBC: 3.02 MIL/uL — ABNORMAL LOW (ref 4.22–5.81)
RDW: 14.1 % (ref 11.5–15.5)
WBC: 6.5 10*3/uL (ref 4.0–10.5)

## 2016-08-05 LAB — RENAL FUNCTION PANEL
Albumin: 2.7 g/dL — ABNORMAL LOW (ref 3.5–5.0)
Anion gap: 15 (ref 5–15)
BUN: 112 mg/dL — AB (ref 6–20)
CHLORIDE: 112 mmol/L — AB (ref 101–111)
CO2: 17 mmol/L — ABNORMAL LOW (ref 22–32)
Calcium: 8.6 mg/dL — ABNORMAL LOW (ref 8.9–10.3)
Creatinine, Ser: 7.36 mg/dL — ABNORMAL HIGH (ref 0.61–1.24)
GFR calc Af Amer: 8 mL/min — ABNORMAL LOW (ref >60)
GFR calc non Af Amer: 7 mL/min — ABNORMAL LOW (ref >60)
GLUCOSE: 83 mg/dL (ref 65–99)
POTASSIUM: 3.7 mmol/L (ref 3.5–5.1)
Phosphorus: 8.4 mg/dL — ABNORMAL HIGH (ref 2.5–4.6)
Sodium: 144 mmol/L (ref 135–145)

## 2016-08-05 LAB — ALT: ALT: 24 U/L (ref 17–63)

## 2016-08-05 MED ORDER — PRO-STAT SUGAR FREE PO LIQD
30.0000 mL | Freq: Two times a day (BID) | ORAL | Status: DC
  Administered 2016-08-06: 30 mL via ORAL
  Administered 2016-08-06: 22:00:00 via ORAL
  Administered 2016-08-08: 30 mL via ORAL
  Filled 2016-08-05 (×6): qty 30

## 2016-08-05 MED ORDER — PROMETHAZINE HCL 25 MG/ML IJ SOLN
25.0000 mg | Freq: Once | INTRAMUSCULAR | Status: AC
  Administered 2016-08-05: 25 mg via INTRAVENOUS
  Filled 2016-08-05: qty 1

## 2016-08-05 MED ORDER — ACETAMINOPHEN 650 MG RE SUPP: 650.0000 mg | Freq: Once | RECTAL | Status: DC | PRN

## 2016-08-05 MED ORDER — DARBEPOETIN ALFA 60 MCG/0.3ML IJ SOSY
PREFILLED_SYRINGE | INTRAMUSCULAR | Status: AC
  Administered 2016-08-05: 60 ug via SUBCUTANEOUS
  Filled 2016-08-05: qty 0.3

## 2016-08-05 NOTE — Telephone Encounter (Signed)
Sched lab 09/07/16 at 4:00 and MD 09/14/16 at 2:30. Lm on hm# for pt to confirm appts.

## 2016-08-05 NOTE — Progress Notes (Signed)
HD tx completed @ 1140 w/o problems, UF goal met, blood rinsed back, attempted to call report to primary nurse but she was unable to receive report at the time, made her aware I would probably be bringing pt back in a few mins and she advised me I may need to give report to someone else

## 2016-08-05 NOTE — Progress Notes (Signed)
   Subjective: Reports continued improvement in his symptoms overnight, when seen on rounds this morning he was feeling nauseous after standing up by the sink but reported he had been moving around in his room without difficulty yesterday. He continues to report poor appetite but diarrhea is improved.   Objective:  Vital signs in last 24 hours: Vitals:   08/05/16 1130 08/05/16 1140 08/05/16 1147 08/05/16 1230  BP: 138/74 137/76 (!) 144/79 (!) 151/62  Pulse: 87 87 85 84  Resp: 12 16 15 16   Temp:   (!) 96.7 F (35.9 C) 97.9 F (36.6 C)  TempSrc:   Oral Oral  SpO2: 95% 98% 98% 97%  Weight:   (!) 317 lb 0.3 oz (143.8 kg)   Height:       Physical Exam  Constitutional: He is oriented to person, place, and time and well-developed, well-nourished, and in no distress. No distress.  HENT:  Head: Normocephalic and atraumatic.  Eyes: Conjunctivae are normal. Right eye exhibits no discharge. Left eye exhibits no discharge. No scleral icterus.  Cardiovascular:  1+ lower extremity pitting edema  Right internal jugular dialysis catheter, Left arm fistula surgical site clean dry and intact with bruit   Pulmonary/Chest: Effort normal.  Abdominal: He exhibits distension.  Neurological: He is alert and oriented to person, place, and time.  Skin: He is not diaphoretic.  Psychiatric: Affect and judgment normal.   Assessment/Plan:  Principal Problem:   ESRD (end stage renal disease) (HCC) Active Problems:   Idiopathic chronic gout without tophus   Hypertension   History of gastroesophageal reflux (GERD)   Pulmonary nodule   Right bundle branch block   Pressure injury of skin   Secondary renal hyperparathyroidism (HCC)   Anemia of renal disease  End-stage renal disease Initiating dialysis today  - Nephrology is following, we appreciate their recommendations  -Follow-up hepatitis B surface antigen - trend BMP  - continue home med lasix 80 mg twice daily while awaiting dialysis  -zofran  PRN  -cardiac monitoring  -monitoring daily weights   Hyperphosphatemia  Hypocalcemia  - starting phoslo TID with meals  -Calcitriol started for metabolic bone disease  Low bicarbonate Improving  Started sodium bicarbonate supplementation  Normocytic Anemia Hemoglobin 8.7, tsat is 23, question if he may benefit from IV iron with dialysis.  - aranesp IV every 14 days  Chronic gout Stable Continue febuxostat 40 mg every other day  Hypertension Blood pressure well controlled today Continue home meds hydralazine 50 mg 3 times daily  History of GERD Stable Continue home med famotidine 20 mg twice daily   New RBBB  Currently on cardiac monitor which will be mailed back on Thursday   Dispo: Anticipated discharge in approximately 3-5 day(s).   Ledell Noss, MD 08/05/2016, 3:07 PM Pager: [phone]

## 2016-08-05 NOTE — Progress Notes (Signed)
Internal Medicine Attending:   I saw and examined the patient. I reviewed the resident's note and I agree with the resident's findings and plan as documented in the resident's note. Patient seen while on dialysis, reports mild improvement in nausea/diarrhea but overall still feeling poor. On exam he has TDC in right chest, heart RRR, left arm with clean surgical scar and +bruit. Agree with plan.

## 2016-08-05 NOTE — Telephone Encounter (Signed)
-----   Message from Mena Goes, RN sent at 08/04/2016  9:48 PM EDT ----- Regarding: 4-6 weeks w/ duplex   ----- Message ----- From: Gabriel Earing, PA-C Sent: 08/04/2016   4:41 PM To: Vvs Charge Pool  S/p left BC AVF and [facility] 08/04/16.  F/u with Dr. Trula Slade in 4-6 weeks with duplex.  Thanks

## 2016-08-05 NOTE — Progress Notes (Signed)
  Postoperative hemodialysis access     Date of Surgery:  08/04/16 Surgeon: Trula Slade  Subjective:  Says he feels like he's been beaten but feels better than when he was admitted.  Denies any pain in his hand.  PHYSICAL EXAMINATION:  Vitals:   08/04/16 2014 08/05/16 0443  BP: (!) 132/54 (!) 129/54  Pulse: 78 84  Resp: 16 16  Temp: 98.3 F (36.8 C) 97.8 F (36.6 C)    Incision is clean and dry Sensation in digits is intact;  There is  Thrill  There is bruit. The graft/fistula is palpable    ASSESSMENT/PLAN:  Richard Bush is a 66 y.o. year old male who is s/p left brachial cephalic AVF and tunneled dialysis catheter placement.  -graft/fistula is patent -pt does not have evidence of steal sx -f/u with Dr. Trula Slade in 4-6 weeks to check maturation of AVF.  Our office will call him to schedule this.  May eventually need superficialization of the fistula. -will sign off-call as needed.   Leontine Locket, PA-C [facility] [phone]

## 2016-08-05 NOTE — Procedures (Signed)
Patient seen and examined on Hemodialysis. QB 200 mL/ min via R TDC, UF goal 500 mL.  HD #1 today, tolerating treatment well.  Treatment adjusted as needed.  Madelon Lips MD [facility]  pgr [phone] 11:45 AM

## 2016-08-05 NOTE — Progress Notes (Signed)
HD tx initiated via HD cath w/o problem, pull/push/flush w/o problem, will cont to monitor while on HD tx

## 2016-08-05 NOTE — Plan of Care (Signed)
Problem: Education: Goal: Knowledge of [facility] Education information/materials will improve Outcome: Progressing POC reviewed with pt./wife; healing process of (R) chest HD cath and (L) arm fistula.

## 2016-08-05 NOTE — Progress Notes (Signed)
[facility] Progress Note   Assessment/ Plan:   1.  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  Pt having progressive symptoms of uremia and no improvement of Scr after discontinuation of ARB.  Investigation into secondary causes of progressive CKD unrevealing.  Have discussed with pt and family of the need to initiate dialysis. For HD #1 today. S/p permcath and access placement yesterday, CLIP in process.    2.  N/v: Most likely uremia; relieved a little with Zofran.  Per primary but expect to improve with dialysis.  3.  HTN: On hydralazine 50 mg TID  4.  Volume: UF will help this.  5.  New RBBB: cardiac monitor to be mailed back Thursday; wife aware; TTE performed in [place]  6. Gout: on Uloric 40 mg Qod.  7.  Anemia: Aranesp 60 mcg q Tuesday  8.  Bone/ mineral: continue calcitriol 0.25 mcg daily, phos 8.1, will start phoslo 667 mg TID WC; this is the cheapest binder (usually); prefer a noncalcium based binder when Medicare for ESRD has been sorted out  Subjective:    Doing OK- first HD today.   Objective:   BP 138/74 (BP Location: Right Wrist)   Pulse 87   Temp 97 F (36.1 C) (Oral)   Resp 12   Ht 5\' 11"  (1.803 m)   Wt (!) 144.3 kg (318 lb 2 oz)   SpO2 95%   BMI 44.37 kg/m   Physical Exam: GEN older gentleman, NAD HEENT EOMI, PERRL, MMM NECK no JVD, thryomegaly, or carotid bruits; thick neck PULM normal WOB, clear anteriorly, diminished in bases bilaterally CV RRR, no m/r/g ABD benign today EXT 1+ LE edema NEURO nonfocal SKIN no rashes or lesions MSK: no active gout flare at this time  Labs: BMET  Recent Labs Lab 08/03/16 0938 08/04/16 0543 08/04/16 1156 08/04/16 1707 08/05/16 0445  NA 143 143  --  145 144  K 3.6 3.4*  --  3.8 3.7  CL 111 112*  --  116* 112*  CO2 17* 18*  --  14* 17*  GLUCOSE 104* 83  --  98 83  BUN 110* 114*  --  113* 112*  CREATININE 7.90* 7.62*  --  7.57* 7.36*  CALCIUM 8.7* 8.5*  --  8.6*  8.6*  PHOS  --  8.4* 8.1* 8.0* 8.4*   CBC  Recent Labs Lab 08/03/16 0938 08/04/16 0543 08/04/16 1707 08/05/16 0445  WBC 5.9 6.1 6.3 6.5  HGB 9.9* 8.7* 9.7* 8.9*  HCT 31.7* 28.2* 31.7* 29.4*  MCV 95.8 96.2 97.2 97.4  PLT 225 210 209 202    @IMGRELPRIORS @ Medications:    . calcitRIOL  0.25 mcg Oral q morning - 10a  . calcium acetate  667 mg Oral TID WC  . darbepoetin (ARANESP) injection - NON-DIALYSIS  60 mcg Subcutaneous Q Tue-1800  . famotidine  20 mg Oral BID  . febuxostat  40 mg Oral QODAY  . furosemide  80 mg Oral BID  . heparin  5,000 Units Subcutaneous Q8H  . hydrALAZINE  50 mg Oral TID  . loratadine  10 mg Oral Daily  . omega-3 acid ethyl esters  2 g Oral BID  . pneumococcal 23 valent vaccine  0.5 mL Intramuscular Tomorrow-1000  . simvastatin  20 mg Oral q morning - 10a  . sodium bicarbonate  650 mg Oral Daily  . sodium chloride flush  3 mL Intravenous Q12H     Madelon Lips MD [facility] pgr [phone] 08/05/2016, 11:42 AM

## 2016-08-06 LAB — MRSA PCR SCREENING: MRSA by PCR: NEGATIVE

## 2016-08-06 LAB — RENAL FUNCTION PANEL
ALBUMIN: 2.6 g/dL — AB (ref 3.5–5.0)
ANION GAP: 10 (ref 5–15)
BUN: 74 mg/dL — AB (ref 6–20)
CALCIUM: 8.4 mg/dL — AB (ref 8.9–10.3)
CO2: 22 mmol/L (ref 22–32)
Chloride: 108 mmol/L (ref 101–111)
Creatinine, Ser: 6.08 mg/dL — ABNORMAL HIGH (ref 0.61–1.24)
GFR calc Af Amer: 10 mL/min — ABNORMAL LOW (ref >60)
GFR calc non Af Amer: 9 mL/min — ABNORMAL LOW (ref >60)
Glucose, Bld: 88 mg/dL (ref 65–99)
Phosphorus: 5.6 mg/dL — ABNORMAL HIGH (ref 2.5–4.6)
Potassium: 3.6 mmol/L (ref 3.5–5.1)
SODIUM: 140 mmol/L (ref 135–145)

## 2016-08-06 LAB — HEPATITIS B SURFACE ANTIBODY,QUALITATIVE: HEP B S AB: NONREACTIVE

## 2016-08-06 LAB — HEPATITIS B CORE ANTIBODY, TOTAL: Hep B Core Total Ab: NEGATIVE

## 2016-08-06 MED ORDER — DARBEPOETIN ALFA 60 MCG/0.3ML IJ SOSY: 60.0000 ug | PREFILLED_SYRINGE | INTRAMUSCULAR | Status: DC

## 2016-08-06 MED ORDER — PROMETHAZINE HCL 25 MG/ML IJ SOLN
12.5000 mg | Freq: Three times a day (TID) | INTRAMUSCULAR | Status: DC | PRN
  Administered 2016-08-06 – 2016-08-07 (×2): 12.5 mg via INTRAVENOUS
  Filled 2016-08-06 (×2): qty 1

## 2016-08-06 MED ORDER — LOPERAMIDE HCL 2 MG PO CAPS
2.0000 mg | ORAL_CAPSULE | ORAL | Status: DC | PRN
  Administered 2016-08-06 – 2016-08-07 (×4): 2 mg via ORAL
  Filled 2016-08-06 (×4): qty 1

## 2016-08-06 NOTE — Progress Notes (Addendum)
   Subjective:   Continued to have nausea, decreased appetite, and diarrhea overnight. When seen on rounds he was at dialysis was presently having nausea.  Objective:  Vital signs in last 24 hours: Vitals:   08/06/16 1030 08/06/16 1100 08/06/16 1127 08/06/16 1155  BP: (!) 142/83 (!) 148/67 (!) 157/74 (!) 155/77  Pulse: 82 80 84 86  Resp:   16 16  Temp:   98.3 F (36.8 C) 98.8 F (37.1 C)  TempSrc:   Oral Oral  SpO2:   92% 94%  Weight:   (!) 312 lb 9.8 oz (141.8 kg)   Height:       Physical Exam  Constitutional: He is oriented to person, place, and time and well-developed, well-nourished, and in no distress. No distress.  HENT:  Head: Normocephalic and atraumatic.  Eyes: Conjunctivae are normal. Right eye exhibits no discharge. Left eye exhibits no discharge. No scleral icterus.  Cardiovascular:  1+ lower extremity pitting edema  Right internal jugular dialysis catheter, Left arm fistula surgical site clean dry and intact with bruit   Pulmonary/Chest: Effort normal.  Left lower lobe crackles   Abdominal: He exhibits distension.  Neurological: He is alert and oriented to person, place, and time.  Skin: He is not diaphoretic.  Psychiatric: Affect and judgment normal.   Assessment/Plan:  Principal Problem:   ESRD (end stage renal disease) (HCC) Active Problems:   Idiopathic chronic gout without tophus   Hypertension   History of gastroesophageal reflux (GERD)   Pulmonary nodule   Right bundle branch block   Pressure injury of skin   Secondary renal hyperparathyroidism (HCC)   Anemia of renal disease  End-stage renal disease Initiating dialysis Yesterday and he received the second session today. Clip is in process. Weight is improving.  - Nephrology is following, we appreciate their recommendations  - trend BMP  -zofran and Phenergan PRN  -monitoring daily weights   Hyperphosphatemia  Hypocalcemia  High phosphorus is improving. Low calcium is stable. - phoslo TID  with meals  -Calcitriol started for metabolic bone disease  Low bicarbonate Improving  Started sodium bicarbonate supplementation  Normocytic Anemia Hemoglobin 8.7, tsat is 23, question if he may benefit from IV iron with dialysis.  - aranesp IV every 14 days  Chronic gout Stable Continue febuxostat 40 mg every other day  Hypertension Blood pressure is somewhat elevated today however multiple doses of Hydralazine have needed to be held for dialysis.  Continue home meds hydralazine 50 mg 3 times daily Continue to monitor  History of GERD Stable Continue home med famotidine 20 mg twice daily   Dispo: Anticipated discharge in approximately 2-3 day(s).   Ledell Noss, MD 08/06/2016, 2:52 PM Pager: [phone]

## 2016-08-06 NOTE — Progress Notes (Signed)
Patient is requesting imodium. Patient states that he takes daily at home. MD notified. MD stated that she would take care of this situation. Will continue to monitor.

## 2016-08-06 NOTE — Procedures (Signed)
Patient seen and examined on Hemodialysis. QB 300 mL/ min, UF goal 500 mL Treatment adjusted as needed.  Madelon Lips MD  [facility] pgr [phone] 1:27 PM

## 2016-08-06 NOTE — Progress Notes (Signed)
Internal Medicine Attending:   I saw and examined the patient. I reviewed the resident's note and I agree with the resident's findings and plan as documented in the resident's note. Overall seems to be tolerating HD, continues to have Nausea however improved with phenergan.

## 2016-08-06 NOTE — Progress Notes (Signed)
[facility] Progress Note   Assessment/ Plan:   1.  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  Pt having progressive symptoms of uremia and no improvement of Scr after discontinuation of ARB.  Investigation into secondary causes of progressive CKD unrevealing. For HD #2 today. S/p permcath and access placement yesterday, CLIP in process.    2.  N/v: Most likely uremia; relieved a little with Zofran.  Per primary, improved.  3.  HTN: On hydralazine 50 mg TID  4.  Volume: UF will help this.  5.  New RBBB: cardiac monitor to be mailed back Thursday; wife aware; TTE performed in [place]  6. Gout: on Uloric 40 mg Qod.  7.  Anemia: Aranesp 60 mcg q Tuesday (6/19-)  8.  Bone/ mineral: continue calcitriol 0.25 mcg daily, phos 8.1, will start phoslo 667 mg TID WC; this is the cheapest binder (usually); prefer a noncalcium based binder when Medicare for ESRD has been sorted out  Subjective:    HD #2 today.   Objective:   BP (!) 155/77 (BP Location: Right Wrist)   Pulse 86   Temp 98.8 F (37.1 C) (Oral)   Resp 16   Ht 5\' 11"  (1.803 m)   Wt (!) 141.8 kg (312 lb 9.8 oz) Comment: standing  SpO2 94%   BMI 43.60 kg/m   Physical Exam: GEN older gentleman, NAD HEENT EOMI, PERRL, MMM NECK no JVD, thryomegaly, or carotid bruits; thick neck PULM normal WOB, clear anteriorly, diminished in bases bilaterally CV RRR, no m/r/g ABD benign today EXT 1+ LE edema NEURO nonfocal SKIN no rashes or lesions MSK: no active gout flare at this time ACCESS: R TDC, LUE AVF  Labs: BMET  Recent Labs Lab 08/03/16 0938 08/04/16 0543 08/04/16 1156 08/04/16 1707 08/05/16 0445 08/06/16 0418  NA 143 143  --  145 144 140  K 3.6 3.4*  --  3.8 3.7 3.6  CL 111 112*  --  116* 112* 108  CO2 17* 18*  --  14* 17* 22  GLUCOSE 104* 83  --  98 83 88  BUN 110* 114*  --  113* 112* 74*  CREATININE 7.90* 7.62*  --  7.57* 7.36* 6.08*  CALCIUM 8.7* 8.5*  --  8.6* 8.6*  8.4*  PHOS  --  8.4* 8.1* 8.0* 8.4* 5.6*   CBC  Recent Labs Lab 08/03/16 0938 08/04/16 0543 08/04/16 1707 08/05/16 0445  WBC 5.9 6.1 6.3 6.5  HGB 9.9* 8.7* 9.7* 8.9*  HCT 31.7* 28.2* 31.7* 29.4*  MCV 95.8 96.2 97.2 97.4  PLT 225 210 209 202    @IMGRELPRIORS @ Medications:    . calcitRIOL  0.25 mcg Oral q morning - 10a  . calcium acetate  667 mg Oral TID WC  . [START ON 08/12/2016] darbepoetin (ARANESP) injection - NON-DIALYSIS  60 mcg Subcutaneous Q Wed-1800  . famotidine  20 mg Oral BID  . febuxostat  40 mg Oral QODAY  . feeding supplement (PRO-STAT SUGAR FREE 64)  30 mL Oral BID  . heparin  5,000 Units Subcutaneous Q8H  . hydrALAZINE  50 mg Oral TID  . loratadine  10 mg Oral Daily  . omega-3 acid ethyl esters  2 g Oral BID  . pneumococcal 23 valent vaccine  0.5 mL Intramuscular Tomorrow-1000  . simvastatin  20 mg Oral q morning - 10a  . sodium bicarbonate  650 mg Oral Daily  . sodium chloride flush  3 mL Intravenous Q12H  Madelon Lips MD [facility] pgr [phone] 08/06/2016, 1:25 PM

## 2016-08-06 NOTE — Progress Notes (Signed)
No further complaints of nausea when asked this evening. Decreased po intake continues even after nausea subsided.

## 2016-08-07 LAB — RENAL FUNCTION PANEL
ALBUMIN: 2.7 g/dL — AB (ref 3.5–5.0)
Anion gap: 11 (ref 5–15)
BUN: 46 mg/dL — AB (ref 6–20)
CHLORIDE: 100 mmol/L — AB (ref 101–111)
CO2: 25 mmol/L (ref 22–32)
CREATININE: 4.7 mg/dL — AB (ref 0.61–1.24)
Calcium: 8.6 mg/dL — ABNORMAL LOW (ref 8.9–10.3)
GFR calc Af Amer: 14 mL/min — ABNORMAL LOW (ref >60)
GFR, EST NON AFRICAN AMERICAN: 12 mL/min — AB (ref >60)
Glucose, Bld: 89 mg/dL (ref 65–99)
Phosphorus: 4.7 mg/dL — ABNORMAL HIGH (ref 2.5–4.6)
Potassium: 3.3 mmol/L — ABNORMAL LOW (ref 3.5–5.1)
SODIUM: 136 mmol/L (ref 135–145)

## 2016-08-07 LAB — CBC
HCT: 30.9 % — ABNORMAL LOW (ref 39.0–52.0)
Hemoglobin: 9.5 g/dL — ABNORMAL LOW (ref 13.0–17.0)
MCH: 29.5 pg (ref 26.0–34.0)
MCHC: 30.7 g/dL (ref 30.0–36.0)
MCV: 96 fL (ref 78.0–100.0)
PLATELETS: 181 10*3/uL (ref 150–400)
RBC: 3.22 MIL/uL — ABNORMAL LOW (ref 4.22–5.81)
RDW: 13.2 % (ref 11.5–15.5)
WBC: 7 10*3/uL (ref 4.0–10.5)

## 2016-08-07 MED ORDER — DARBEPOETIN ALFA 60 MCG/0.3ML IJ SOSY: 60.0000 ug | PREFILLED_SYRINGE | INTRAMUSCULAR | Status: DC

## 2016-08-07 MED ORDER — ONDANSETRON HCL 4 MG/2ML IJ SOLN
INTRAMUSCULAR | Status: AC
Start: 2016-08-07 — End: 2016-08-07
  Filled 2016-08-07: qty 2

## 2016-08-07 NOTE — Progress Notes (Signed)
Internal Medicine Attending:   I saw and examined the patient. I reviewed the resident's note and I agree with the resident's findings and plan as documented in the resident's note. Nausea improved, reports overall feeling much better than PTA however still having significant nausea and diarrhea to require medication.  Otherwise no complaints and tolerating HD.

## 2016-08-07 NOTE — Progress Notes (Signed)
   Subjective:   Nausea was well controlled with Phenergan overnight and he is eating more. He and his wife are curious about the clip process and ask about how long he will be in the hospital.  Objective:  Vital signs in last 24 hours: Vitals:   08/07/16 0830 08/07/16 0900 08/07/16 0930 08/07/16 1000  BP: (!) 141/72 138/70 (!) 141/73 131/75  Pulse: 84 88 85 88  Resp: 18 17 18 17   Temp:      TempSrc:      SpO2: 97% 98% 97% 96%  Weight:      Height:       Physical Exam  Constitutional: He is oriented to person, place, and time and well-developed, well-nourished, and in no distress. No distress.  HENT:  Head: Normocephalic and atraumatic.  Eyes: Conjunctivae are normal. Right eye exhibits no discharge. Left eye exhibits no discharge. No scleral icterus.  Cardiovascular:  Trace lower extremity pitting edema  Right internal jugular dialysis catheter clean dry and intact  Pulmonary/Chest: Effort normal. No respiratory distress.  Neurological: He is alert and oriented to person, place, and time.  Skin: He is not diaphoretic.  Psychiatric: Affect and judgment normal.   Assessment/Plan:  Principal Problem:   ESRD (end stage renal disease) (HCC) Active Problems:   Idiopathic chronic gout without tophus   Hypertension   History of gastroesophageal reflux (GERD)   Pulmonary nodule   Right bundle branch block   Pressure injury of skin   Secondary renal hyperparathyroidism (HCC)   Anemia of renal disease  End-stage renal disease When the third dialysis treatment today the treatment have been well tolerated. Clip is in process. Weight is improving. 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  - Nephrology is following, we appreciate their recommendations  - trend renal function labs  -zofran and  Phenergan PRN  -monitoring daily weights   Hyperphosphatemia  Hypocalcemia  High phosphorus is improving, Low calcium is stable. - phoslo TID with meals (this should be switched to a non calcium containing binder after he has medicare established.)  -Calcitriol started for metabolic bone disease  Low bicarbonate Resolved Resumed sodium bicarbonate supplementation  Normocytic Anemia Hemoglobin 8.7, tsat is 23, question if he may benefit from IV iron with dialysis.  - aranesp IV every 14 days  Chronic gout Stable Continue febuxostat 40 mg every other day  Hypertension Normotensive today Continue home meds hydralazine 50 mg 3 times daily Continue to monitor  History of GERD Stable Continue home med famotidine 20 mg twice daily   Dispo: Anticipated discharge in approximately 2-3 day(s).   Ledell Noss, MD 08/07/2016, 10:48 AM Pager: [phone]

## 2016-08-07 NOTE — Procedures (Signed)
Patient seen and examined on Hemodialysis. QB 350 mL/ min, UF goal 1000 mL.  HD #3 today.  Treatment adjusted as needed.  Madelon Lips MD  [facility] pgr [phone] 11:12 AM

## 2016-08-07 NOTE — Care Management Important Message (Signed)
Important Message  Patient Details  Name: Richard Bush MRN: [id number] Date of Birth: 23-Jun-1950   Medicare Important Message Given:  Yes    [name] 08/07/2016, 12:52 PM

## 2016-08-07 NOTE — Progress Notes (Signed)
[facility] Progress Note   Assessment/ Plan:   1.  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  Pt having progressive symptoms of uremia and no improvement of Scr after discontinuation of ARB.  Investigation into secondary causes of progressive CKD unrevealing. For HD #3 today. S/p permcath and access placement yesterday, CLIP in process.    2.  N/v: improving with HD  3.  HTN: On hydralazine 50 mg TID  4.  Volume: UF will help this.  5.  New RBBB: cardiac monitor mailed back  6. Gout: on Uloric 40 mg Qod.  7.  Anemia: Aranesp 60 mcg q Tuesday (6/19-)  8.  Bone/ mineral: continue calcitriol 0.25 mcg daily, phos 8.1, will start phoslo 667 mg TID WC; this is the cheapest binder (usually); prefer a noncalcium based binder when Medicare for ESRD has been sorted out  Subjective:    HD #3 today.  Tolerating quite well.  No complaints.  Nausea much better   Objective:   BP 131/75   Pulse 88   Temp 98 F (36.7 C) (Oral)   Resp 17   Ht 5\' 11"  (1.803 m)   Wt (!) 141.9 kg (312 lb 13.3 oz)   SpO2 96%   BMI 43.63 kg/m   Physical Exam: GEN older gentleman, NAD HEENT EOMI, PERRL, MMM NECK no JVD, thryomegaly, or carotid bruits; thick neck PULM normal WOB, clear anteriorly, diminished in bases bilaterally CV RRR, no m/r/g ABD benign today EXT 1+ LE edema, improving NEURO nonfocal SKIN no rashes or lesions MSK: no active gout flare at this time ACCESS: R TDC, LUE AVF with + T/B, well healing incision, some bruising  Labs: BMET  Recent Labs Lab 08/03/16 0938 08/04/16 0543 08/04/16 1156 08/04/16 1707 08/05/16 0445 08/06/16 0418 08/07/16 0549  NA 143 143  --  145 144 140 136  K 3.6 3.4*  --  3.8 3.7 3.6 3.3*  CL 111 112*  --  116* 112* 108 100*  CO2 17* 18*  --  14* 17* 22 25  GLUCOSE 104* 83  --  98 83 88 89  BUN 110* 114*  --  113* 112* 74* 46*  CREATININE 7.90* 7.62*  --  7.57* 7.36* 6.08* 4.70*  CALCIUM 8.7* 8.5*  --  8.6*  8.6* 8.4* 8.6*  PHOS  --  8.4* 8.1* 8.0* 8.4* 5.6* 4.7*   CBC  Recent Labs Lab 08/04/16 0543 08/04/16 1707 08/05/16 0445 08/07/16 0633  WBC 6.1 6.3 6.5 7.0  HGB 8.7* 9.7* 8.9* 9.5*  HCT 28.2* 31.7* 29.4* 30.9*  MCV 96.2 97.2 97.4 96.0  PLT 210 209 202 181    @IMGRELPRIORS @ Medications:    . calcitRIOL  0.25 mcg Oral q morning - 10a  . calcium acetate  667 mg Oral TID WC  . [START ON 08/12/2016] darbepoetin (ARANESP) injection - DIALYSIS  60 mcg Intravenous Q Wed-HD  . famotidine  20 mg Oral BID  . febuxostat  40 mg Oral QODAY  . feeding supplement (PRO-STAT SUGAR FREE 64)  30 mL Oral BID  . heparin  5,000 Units Subcutaneous Q8H  . hydrALAZINE  50 mg Oral TID  . loratadine  10 mg Oral Daily  . omega-3 acid ethyl esters  2 g Oral BID  . simvastatin  20 mg Oral q morning - 10a  . sodium bicarbonate  650 mg Oral Daily  . sodium chloride flush  3 mL Intravenous Q12H     Madelon Lips  MD [facility] pgr [phone] 08/07/2016, 11:11 AM

## 2016-08-08 DIAGNOSIS — I120 Hypertensive chronic kidney disease with stage 5 chronic kidney disease or end stage renal disease: Principal | ICD-10-CM

## 2016-08-08 DIAGNOSIS — Z882 Allergy status to sulfonamides status: Secondary | ICD-10-CM

## 2016-08-08 DIAGNOSIS — Z992 Dependence on renal dialysis: Secondary | ICD-10-CM

## 2016-08-08 DIAGNOSIS — R911 Solitary pulmonary nodule: Secondary | ICD-10-CM

## 2016-08-08 DIAGNOSIS — Z888 Allergy status to other drugs, medicaments and biological substances status: Secondary | ICD-10-CM

## 2016-08-08 DIAGNOSIS — N186 End stage renal disease: Secondary | ICD-10-CM

## 2016-08-08 DIAGNOSIS — E785 Hyperlipidemia, unspecified: Secondary | ICD-10-CM

## 2016-08-08 DIAGNOSIS — M1030 Gout due to renal impairment, unspecified site: Secondary | ICD-10-CM

## 2016-08-08 DIAGNOSIS — Z79899 Other long term (current) drug therapy: Secondary | ICD-10-CM

## 2016-08-08 DIAGNOSIS — D631 Anemia in chronic kidney disease: Secondary | ICD-10-CM

## 2016-08-08 DIAGNOSIS — N2581 Secondary hyperparathyroidism of renal origin: Secondary | ICD-10-CM

## 2016-08-08 DIAGNOSIS — Z6841 Body Mass Index (BMI) 40.0 and over, adult: Secondary | ICD-10-CM

## 2016-08-08 DIAGNOSIS — J449 Chronic obstructive pulmonary disease, unspecified: Secondary | ICD-10-CM

## 2016-08-08 LAB — RENAL FUNCTION PANEL
Albumin: 2.8 g/dL — ABNORMAL LOW (ref 3.5–5.0)
Anion gap: 11 (ref 5–15)
BUN: 31 mg/dL — ABNORMAL HIGH (ref 6–20)
CHLORIDE: 97 mmol/L — AB (ref 101–111)
CO2: 26 mmol/L (ref 22–32)
CREATININE: 4.51 mg/dL — AB (ref 0.61–1.24)
Calcium: 8.6 mg/dL — ABNORMAL LOW (ref 8.9–10.3)
GFR, EST AFRICAN AMERICAN: 14 mL/min — AB (ref >60)
GFR, EST NON AFRICAN AMERICAN: 12 mL/min — AB (ref >60)
Glucose, Bld: 89 mg/dL (ref 65–99)
POTASSIUM: 3.1 mmol/L — AB (ref 3.5–5.1)
Phosphorus: 3.9 mg/dL (ref 2.5–4.6)
Sodium: 134 mmol/L — ABNORMAL LOW (ref 135–145)

## 2016-08-08 MED ORDER — PROMETHAZINE HCL 12.5 MG PO TABS: 12.5000 mg | ORAL_TABLET | Freq: Four times a day (QID) | ORAL | 0 refills | Status: DC | PRN

## 2016-08-08 MED ORDER — SODIUM BICARBONATE 650 MG PO TABS: 650.0000 mg | ORAL_TABLET | Freq: Every day | ORAL | 0 refills | Status: DC

## 2016-08-08 MED ORDER — KIDNEY FAILURE BOOK
Freq: Once | Status: AC
  Administered 2016-08-08: 12:00:00
  Filled 2016-08-08: qty 1

## 2016-08-08 MED ORDER — CALCIUM ACETATE (PHOS BINDER) 667 MG PO CAPS: 667.0000 mg | ORAL_CAPSULE | Freq: Three times a day (TID) | ORAL | 0 refills | Status: DC

## 2016-08-08 MED ORDER — DARBEPOETIN ALFA 60 MCG/0.3ML IJ SOSY: 60.0000 ug | PREFILLED_SYRINGE | INTRAMUSCULAR | Status: DC

## 2016-08-08 NOTE — Discharge Summary (Signed)
Name: Muneer Leider MRN: [id number] DOB: 30-May-1950 66 y.o. PCP: Quentin Cornwall, MD  Date of Admission: 08/03/2016  9:39 AM Date of Discharge: 08/08/2016 Attending Physician: Lucious Groves, DO  Discharge Diagnosis: 1.  ESRD (end stage renal disease) [facility])  Discharge Medications: Allergies as of 08/08/2016      Reactions   Altace [ramipril] Cough   Chronic cough   Cholestyramine Light Other (See Comments)   unspecified   Cozaar [losartan Potassium] Other (See Comments)   Stopped due to kidney decline 4/18   Kayexalate [polystyrene] Other (See Comments)   Abnormal ekg   Other Swelling   "Symvisc"    Sulfa Antibiotics Rash      Medication List    STOP taking these medications   furosemide 80 MG tablet Commonly known as:  LASIX     TAKE these medications   acetaminophen 500 MG tablet Commonly known as:  TYLENOL Take 500 mg by mouth 4 (four) times daily.   calcitRIOL 0.25 MCG capsule Commonly known as:  ROCALTROL Take 0.25 mcg by mouth every morning.   calcium acetate 667 MG capsule Commonly known as:  PHOSLO Take 1 capsule (667 mg total) by mouth 3 (three) times daily with meals.   cetirizine 10 MG tablet Commonly known as:  ZYRTEC Take 10 mg by mouth at bedtime.   colchicine 0.6 MG tablet Take 1 tablet (0.6 mg total) by mouth daily as needed (for gout flareups). What changed:  how much to take   Darbepoetin Alfa 60 MCG/0.3ML Sosy injection Commonly known as:  ARANESP Inject 0.3 mLs (60 mcg total) into the vein every Wednesday with hemodialysis. Start taking on:  08/12/2016   febuxostat 40 MG tablet Commonly known as:  ULORIC Take 1 tablet (40 mg total) by mouth daily. What changed:  when to take this   hydrALAZINE 25 MG tablet Commonly known as:  APRESOLINE Take 50 mg by mouth 3 (three) times daily.   loperamide 2 MG capsule Commonly known as:  IMODIUM Take by mouth as needed for diarrhea or loose stools.   omega-3 acid ethyl esters 1 g  capsule Commonly known as:  LOVAZA Take 2 g by mouth 2 (two) times daily.   oxyCODONE-acetaminophen 5-325 MG tablet Commonly known as:  ROXICET Take 1 tablet by mouth every 4 (four) hours as needed.   promethazine 12.5 MG tablet Commonly known as:  PHENERGAN Take 1 tablet (12.5 mg total) by mouth every 6 (six) hours as needed for nausea or vomiting.   ranitidine 150 MG tablet Commonly known as:  ZANTAC Take 150 mg by mouth 2 (two) times daily.   simvastatin 20 MG tablet Commonly known as:  ZOCOR Take 20 mg by mouth every morning.   sodium bicarbonate 650 MG tablet Take 1 tablet (650 mg total) by mouth daily. Start taking on:  08/09/2016       Disposition and follow-up:   Mr.Aadith Eslick was discharged from [facility] in Stable condition.  At the hospital follow up visit please address:  1. ESRD- initiated HD this admission, follow up on how that has been going for him.   2.  Labs / imaging needed at time of follow-up: none   3.  Pending labs/ test needing follow-up: none  Follow-up Appointments: Follow-up Information    Serafina Mitchell, MD Follow up in 6 week(s).   Specialties:  Vascular Surgery, Cardiology Why:  Office will call you to arrange your appt (sent) Contact information: [address] [address] [phone]        Quentin Cornwall, MD. Schedule an appointment as soon as possible for a visit in 1 week(s).   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 He had been to his nephrologist office Dr. [name] the week prior and had appointment  arranged for vein mapping however his symptoms have progressively worsened and he decided to go to the emergency department. In the ED he was found to have stable vital however his BUN was 110 and creatinine 7.9. Nephrology was consulted and recommended admission for vein mapping and to initiate dialysis. He had a right internal jugular dialysis catheter placed and left brachiocephalic fistula and initiated dialysis the day following admission. He tolerated 3 inpatient dialysis sessions. Nausea and vomiting were thought to be related to uremia and improved with phenergan. On the day that he had an outpatient chair confirmed he reported a better appetite and significant improvement in his nausea. His weight had decreased 30 pounds.    Hypertension Blood pressure was controlled (SBP 120-140s) on home medication hydralazine 50 mg 3 times daily.    Pulmonary nodule A 10 mm right lower lobe nodule was discovered incidentally on CT abdomen and pelvis. Radiology recommended repeat CT in 3 months.    Secondary renal hyperparathyroidism ([place]) He was already on calcitriol prior to presentation. Phosphorous was 8.1 and he was started on PhosLo 3 times a day, nephrology's plan is to change this to a non-calcium containing binder when he has medicare established.     Anemia of renal disease Hemoglobin 8.7, T-Stat 23, he was started on IVAranesp every 14 days.   Discharge Vitals:   BP (!) 120/57 (BP Location: Right Wrist)   Pulse 92   Temp 98.8 F (37.1 C) (Oral)   Resp 17   Ht 5\' 11"  (1.803 m)   Wt (!) 315 lb (142.9 kg)   SpO2 93%   BMI 43.93 kg/m   Pertinent Labs, Studies, and Procedures:  Hep B surface antibody non reactive  Hep B core antibody negative   Discharge Instructions: Discharge Instructions    Diet - low sodium heart healthy    Complete by:  As directed    Increase activity slowly    Complete by:  As directed       Signed: Ledell Noss, MD 08/08/2016, 1:26 PM   Pager:  [phone]

## 2016-08-08 NOTE — Progress Notes (Signed)
[facility] Progress Note   Assessment/ Plan:   1.  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  Pt having progressive symptoms of uremia and no improvement of Scr after discontinuation of ARB.  Investigation into secondary causes of progressive CKD unrevealing. For HD #3 today. S/p permcath and access placement yesterday, CLIP'd to [facility] MWF 2nd shift.  OK to come this Monday June 25th.  Arrival time 11:00 am to sign paperwork, then treatment at 11:45 am.  Can d/c today.   2.  N/v: improving with HD, pt requesting script for nausea on discharge "just in case"  3.  HTN: On hydralazine 50 mg TID  4.  Volume: improving  5.  New RBBB: cardiac monitor mailed back for primary cardiologist.    6. Gout: on Uloric 40 mg Qod.  7.  Anemia: Aranesp 60 mcg q Tuesday (6/19-)  8.  Bone/ mineral: continue calcitriol 0.25 mcg daily, phos 8.1, will start phoslo 667 mg TID WC; this is the cheapest binder (usually); prefer a noncalcium based binder when Medicare for ESRD has been sorted out  Subjective:    Tolerated HD well yesterday.  Nausea is improved.     Objective:   BP (!) 120/57 (BP Location: Right Wrist)   Pulse 92   Temp 98.8 F (37.1 C) (Oral)   Resp 17   Ht 5\' 11"  (1.803 m)   Wt (!) 142.9 kg (315 lb)   SpO2 93%   BMI 43.93 kg/m   Physical Exam: GEN older gentleman, NAD HEENT EOMI, PERRL, MMM NECK no JVD, thryomegaly, or carotid bruits; thick neck PULM normal WOB, clear anteriorly, diminished in bases bilaterally CV RRR, no m/r/g ABD benign today EXT 1+ LE edema, improving NEURO nonfocal SKIN no rashes or lesions MSK: no active gout flare at this time ACCESS: R TDC, LUE AVF with + T/B, well healing incision, some bruising  Labs: BMET  Recent Labs Lab 08/03/16 0938 08/04/16 0543 08/04/16 1156 08/04/16 1707 08/05/16 0445 08/06/16 0418 08/07/16 0549 08/08/16 0420  NA 143 143  --  145 144 140 136 134*  K 3.6 3.4*  --  3.8  3.7 3.6 3.3* 3.1*  CL 111 112*  --  116* 112* 108 100* 97*  CO2 17* 18*  --  14* 17* 22 25 26   GLUCOSE 104* 83  --  98 83 88 89 89  BUN 110* 114*  --  113* 112* 74* 46* 31*  CREATININE 7.90* 7.62*  --  7.57* 7.36* 6.08* 4.70* 4.51*  CALCIUM 8.7* 8.5*  --  8.6* 8.6* 8.4* 8.6* 8.6*  PHOS  --  8.4* 8.1* 8.0* 8.4* 5.6* 4.7* 3.9   CBC  Recent Labs Lab 08/04/16 0543 08/04/16 1707 08/05/16 0445 08/07/16 0633  WBC 6.1 6.3 6.5 7.0  HGB 8.7* 9.7* 8.9* 9.5*  HCT 28.2* 31.7* 29.4* 30.9*  MCV 96.2 97.2 97.4 96.0  PLT 210 209 202 181    @IMGRELPRIORS @ Medications:    . calcitRIOL  0.25 mcg Oral q morning - 10a  . calcium acetate  667 mg Oral TID WC  . [START ON 08/12/2016] darbepoetin (ARANESP) injection - DIALYSIS  60 mcg Intravenous Q Wed-HD  . famotidine  20 mg Oral BID  . febuxostat  40 mg Oral QODAY  . feeding supplement (PRO-STAT SUGAR FREE 64)  30 mL Oral BID  . heparin  5,000 Units Subcutaneous Q8H  . hydrALAZINE  50 mg Oral TID  . kidney failure book  Does not apply Once  . loratadine  10 mg Oral Daily  . omega-3 acid ethyl esters  2 g Oral BID  . simvastatin  20 mg Oral q morning - 10a  . sodium bicarbonate  650 mg Oral Daily  . sodium chloride flush  3 mL Intravenous Q12H     Madelon Lips MD [facility] pgr [phone] 08/08/2016, 11:56 AM

## 2016-08-08 NOTE — Progress Notes (Signed)
   Subjective:   Nausea symptoms improving.  Wife at the bedside.  Tolerating HD well.  Awaiting update on CLIP process.  Objective:  Vital signs in last 24 hours: Vitals:   08/07/16 1800 08/07/16 2236 08/08/16 0548 08/08/16 0950  BP: (!) 123/57 (!) 141/63 135/71 (!) 120/57  Pulse: 88 95 86 92  Resp: 18 18 18 17   Temp: 98.9 F (37.2 C) 98.3 F (36.8 C) 98.2 F (36.8 C) 98.8 F (37.1 C)  TempSrc: Oral Oral Oral Oral  SpO2: 93% 97% 96% 93%  Weight:  (!) 315 lb (142.9 kg)    Height:  5\' 11"  (1.803 m)     Physical Exam  Constitutional: He is oriented to person, place, and time and well-developed, well-nourished, and in no distress. No distress.  HENT:  Head: Normocephalic and atraumatic.  Eyes: Conjunctivae are normal.  Pulmonary/Chest: Effort normal.  Neurological: He is alert and oriented to person, place, and time.  Skin: He is not diaphoretic.  Psychiatric: Affect and judgment normal.   Assessment/Plan:  Principal Problem:   ESRD (end stage renal disease) (HCC) Active Problems:   Idiopathic chronic gout without tophus   Hypertension   History of gastroesophageal reflux (GERD)   Pulmonary nodule   Right bundle branch block   Pressure injury of skin   Secondary renal hyperparathyroidism (HCC)   Anemia of renal disease  End-stage renal disease ESRD due to IgA nephropathy and HTN.  Admitted with progressive symptoms of uremia.  Has tolerated well so far and reports about 50% improvement in nausea symptoms.  He is s/p permcath and access placement this admission.  Has a HD chair at [facility] MWF.  Per nephrology can d/c today. - Discharge today to follow up with outpatient HD  Hyperphosphatemia  Hypocalcemia  High phosphorus is improving, Low calcium is stable. - phoslo TID with meals (this should be switched to a non calcium containing binder after he has medicare established.)  -Calcitriol started for metabolic bone disease  Low bicarbonate Continue sodium  bicarbonate supplementation  Normocytic Anemia Hemoglobin 9.5 - aranesp IV every 14 days  Hypertension Stable Continue home meds hydralazine 50 mg 3 times daily  Dispo: Anticipated discharge today  Jule Ser, DO 08/08/2016, 12:07 PM Pager: [phone]

## 2016-08-08 NOTE — Discharge Instructions (Signed)
° ° °  08/04/2016 [name] [id number] 02/21/1950  Surgeon(s): Serafina Mitchell, MD  Procedure(s): Left Brachiocephalic AV fistula and tunneled dialysis catheter placement  x Do not stick fistula for 12 weeks    Richard Bush,  It was a pleasure to meet you, I am glad your nausea is getting better.   START taking phoslo (calcium acetate) three times daily  START taking sodium bicarb 650 mg daily  STOP taking lasix  Schedule an appointment to see your primary care doctor and nephrologist within the next two weeks. Call them if you have questions about dialysis. If you have questions about your hospitalization call the internal medicine center [phone]

## 2016-08-08 NOTE — Progress Notes (Signed)
  Date: 08/08/2016  Patient name: Richard Bush  Medical record number: [id number]  Date of birth: Aug 02, 1950   I have seen and evaluated this patient and I have discussed the plan of care with the house staff. Please see Dr. Alcario Drought note for complete details. I concur with his findings.   Discharge today.   Sid Falcon, MD 08/08/2016, 12:49 PM

## 2016-08-18 ENCOUNTER — Ambulatory Visit: Payer: Medicare Other | Admitting: Cardiology

## 2016-09-02 ENCOUNTER — Other Ambulatory Visit: Payer: Self-pay

## 2016-09-02 DIAGNOSIS — Z4931 Encounter for adequacy testing for hemodialysis: Secondary | ICD-10-CM

## 2016-09-04 ENCOUNTER — Encounter: Payer: Self-pay | Admitting: Surgery

## 2016-09-07 ENCOUNTER — Encounter (HOSPITAL_COMMUNITY): Payer: Medicare Other

## 2016-09-08 ENCOUNTER — Ambulatory Visit (HOSPITAL_COMMUNITY)
Admission: RE | Admit: 2016-09-08 | Discharge: 2016-09-08 | Disposition: A | Discharge status: Home / Self Care | Payer: Medicare Other | Source: Ambulatory Visit | Attending: Vascular Surgery | Admitting: Vascular Surgery

## 2016-09-08 DIAGNOSIS — I770 Arteriovenous fistula, acquired: Secondary | ICD-10-CM | POA: Diagnosis not present

## 2016-09-08 DIAGNOSIS — Z4931 Encounter for adequacy testing for hemodialysis: Secondary | ICD-10-CM | POA: Insufficient documentation

## 2016-09-14 ENCOUNTER — Encounter: Payer: Medicare Other | Admitting: Surgery

## 2016-09-14 ENCOUNTER — Ambulatory Visit (INDEPENDENT_AMBULATORY_CARE_PROVIDER_SITE_OTHER): Payer: Self-pay | Admitting: Surgery

## 2016-09-14 ENCOUNTER — Encounter: Payer: Self-pay | Admitting: Surgery

## 2016-09-14 VITALS — BP 125/70 | HR 72 | Temp 97.3°F | Resp 20 | Ht 71.0 in | Wt 312.0 lb

## 2016-09-14 DIAGNOSIS — N186 End stage renal disease: Secondary | ICD-10-CM

## 2016-09-14 DIAGNOSIS — Z992 Dependence on renal dialysis: Secondary | ICD-10-CM

## 2016-09-14 NOTE — Progress Notes (Signed)
Patient name: Richard Bush MRN: [id number] DOB: Jun 08, 1950 Sex: male  REASON FOR VISIT:     Postop  HISTORY OF PRESENT ILLNESS:   Richard Bush is a 67 y.o. male who is status post left brachiocephalic history creation on 08/04/2016.  He is now on dialysis Monday Wednesday Friday.  He is using a catheter he does not have any signs of steal.  He does not have any arm swelling.  CURRENT MEDICATIONS:    Current Outpatient Prescriptions  Medication Sig Dispense Refill  . acetaminophen (TYLENOL) 500 MG tablet Take 500 mg by mouth 4 (four) times daily.    . calcitRIOL (ROCALTROL) 0.25 MCG capsule Take 0.25 mcg by mouth every morning. Take at kidney center    . calcium acetate (PHOSLO) 667 MG capsule Take 1 capsule (667 mg total) by mouth 3 (three) times daily with meals. 90 capsule 0  . cetirizine (ZYRTEC) 10 MG tablet Take 10 mg by mouth at bedtime.    . colchicine 0.6 MG tablet Take 1 tablet (0.6 mg total) by mouth daily as needed (for gout flareups). (Patient taking differently: Take 0.3 mg by mouth daily as needed (for gout flareups). ) 90 tablet 0  . Darbepoetin Alfa (ARANESP) 60 MCG/0.3ML SOSY injection Inject 0.3 mLs (60 mcg total) into the vein every Wednesday with hemodialysis. 4.2 mL   . loperamide (IMODIUM) 2 MG capsule Take by mouth as needed for diarrhea or loose stools.    Marland Kitchen omega-3 acid ethyl esters (LOVAZA) 1 G capsule Take 2 g by mouth 2 (two) times daily.    . ranitidine (ZANTAC) 150 MG tablet Take 150 mg by mouth daily.     . simvastatin (ZOCOR) 20 MG tablet Take 20 mg by mouth every morning.    . febuxostat (ULORIC) 40 MG tablet Take 1 tablet (40 mg total) by mouth daily. (Patient taking differently: Take 40 mg by mouth every other day. ) 90 tablet 1  . hydrALAZINE (APRESOLINE) 25 MG tablet Take 50 mg by mouth. Only take if diastolic gets above 010    . oxyCODONE-acetaminophen (ROXICET) 5-325 MG per tablet Take 1 tablet by mouth every 4 (four)  hours as needed. (Patient not taking: Reported on 07/15/2016) 60 tablet 0  . promethazine (PHENERGAN) 12.5 MG tablet Take 1 tablet (12.5 mg total) by mouth every 6 (six) hours as needed for nausea or vomiting. (Patient not taking: Reported on 09/14/2016) 30 tablet 0  . sodium bicarbonate 650 MG tablet Take 1 tablet (650 mg total) by mouth daily. (Patient not taking: Reported on 09/14/2016) 30 tablet 0   No current facility-administered medications for this visit.     REVIEW OF SYSTEMS:   [X]  denotes positive finding, [ ]  denotes negative finding Cardiac  Comments:  Chest pain or chest pressure:    Shortness of breath upon exertion:    Short of breath when lying flat:    Irregular heart rhythm:    Constitutional    Fever or chills:      PHYSICAL EXAM:   Vitals:   09/14/16 0851  BP: 125/70  Pulse: 72  Resp: 20  Temp: (!) 97.3 F (36.3 C)  TempSrc: Oral  SpO2: 92%  Weight: (!) 312 lb (141.5 kg)  Height: 5\' 11"  (1.803 m)    GENERAL: The patient is a well-nourished male, in no acute distress. The vital signs are documented above. CARDIOVASCULAR: There is a regular rate and rhythm. PULMONARY: Non-labored respirations Easily palpable thrill within the lower half of  his fistula.  I evaluated this with ultrasound myself and about the mid upper arm the vein was very deep  STUDIES:   Ultrasound shows excellent caliber vein ranging from 6 mm-1.2 cm.  It does appear to be deep in the mid arm   MEDICAL ISSUES:   I discussed proceeding with elevation of his fistula.  They've details of the surgery discussed with patient and his wife.  He is been scheduled for Thursday, August 9 for elevation of his left brachiocephalic fistula  [name], MD [facility] [phone] Pager [phone]

## 2016-09-18 ENCOUNTER — Other Ambulatory Visit: Payer: Self-pay

## 2016-09-22 ENCOUNTER — Encounter (HOSPITAL_COMMUNITY): Payer: Self-pay | Admitting: *Deleted

## 2016-09-22 NOTE — Progress Notes (Signed)
Spoke with pt for pre-op call. Pt denies cardiac history, chest pain or sob. Pt states he is not diabetic. 

## 2016-09-22 NOTE — Progress Notes (Signed)
Anesthesia Chart Review: SAME DAY WORK-UP.  Patient is a 66 year old male scheduled for left arm AVF superficialization on 09/24/16 by Dr. Trula Slade. He is s/p LUE brachio-cephalic AVF with right IJ Pallindrome catheter insertion 08/04/16.   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 Hospitalized 6/18-6/23/18 for progressive CKD V to ESRD. He underwent hemodialysis access and initiation of HD 08/05/16.  - PCP is listed as Dr. Darlina Sicilian in [place], [place] [phone]).   - Nephrologist is Dr. Donato Heinz  Meds include PhosLo, Zyrtec, colchicine, Uloric, Neurontin, hydralazine, Lovaza, Zantac, Zocor.  EKG 08/03/16: SR or ectopic atrial rhythm, borderline short PR interval, right BBB, LAFB. 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  1V CXR 08/04/16: IMPRESSION: Right central line tip lower right atrium level. No pneumothorax. Cardiomegaly and pulmonary vascular congestion/ mild pulmonary Edema.  He is for labs on arrival.   He tolerated vascular access procedure on 08/04/16. Further evaluation by his anesthesiologist on the day of surgery, but if no acute changes and labs acceptable then I would anticpate that he can proceed as planned.  George Hugh [facility] Short Stay Center/Anesthesiology Phone [phone] 09/22/2016 2:28 PM

## 2016-09-23 NOTE — Anesthesia Preprocedure Evaluation (Addendum)
Anesthesia Evaluation  Patient identified by MRN, date of birth, ID band Patient awake    Reviewed: Allergy & Precautions, NPO status , Patient's Chart, lab work & pertinent test results  History of Anesthesia Complications Negative for: history of anesthetic complications  Airway Mallampati: I  TM Distance: >3 FB Neck ROM: Full    Dental  (+) Teeth Intact   Pulmonary former smoker,    breath sounds clear to auscultation       Cardiovascular hypertension, Pt. on medications + dysrhythmias  Rhythm:Regular Rate:Normal  ECG: SR, rate 87   Neuro/Psych negative neurological ROS  negative psych ROS   GI/Hepatic Neg liver ROS, GERD  Medicated and Controlled,  Endo/Other  Morbid obesity  Renal/GU CRFRenal disease     Musculoskeletal  (+) Arthritis ,   Abdominal   Peds  Hematology  (+) anemia ,   Anesthesia Other Findings Hyperlipidemia   Reproductive/Obstetrics                            Anesthesia Physical  Anesthesia Plan  ASA: IV  Anesthesia Plan: General   Post-op Pain Management:    Induction: Intravenous  PONV Risk Score and Plan: 2 and Ondansetron and Treatment may vary due to age or medical condition  Airway Management Planned: Oral ETT  Additional Equipment: None  Intra-op Plan:   Post-operative Plan: Extubation in OR  Informed Consent: I have reviewed the patients History and Physical, chart, labs and discussed the procedure including the risks, benefits and alternatives for the proposed anesthesia with the patient or authorized representative who has indicated his/her understanding and acceptance.   Dental advisory given  Plan Discussed with: CRNA and Surgeon  Anesthesia Plan Comments:        Anesthesia Quick Evaluation

## 2016-09-24 ENCOUNTER — Ambulatory Visit (HOSPITAL_COMMUNITY)
Admission: RE | Admit: 2016-09-24 | Discharge: 2016-09-24 | Disposition: A | Discharge status: Home / Self Care | Payer: Medicare Other | Source: Ambulatory Visit | Attending: Surgery | Admitting: Surgery

## 2016-09-24 ENCOUNTER — Encounter (HOSPITAL_COMMUNITY)
Admission: RE | Disposition: A | Discharge status: Home / Self Care | Payer: Self-pay | Source: Ambulatory Visit | Attending: Surgery

## 2016-09-24 ENCOUNTER — Ambulatory Visit (HOSPITAL_COMMUNITY): Payer: Medicare Other | Admitting: Emergency Medicine

## 2016-09-24 ENCOUNTER — Encounter (HOSPITAL_COMMUNITY): Payer: Self-pay | Admitting: *Deleted

## 2016-09-24 ENCOUNTER — Telehealth: Payer: Self-pay | Admitting: Surgery

## 2016-09-24 DIAGNOSIS — N186 End stage renal disease: Secondary | ICD-10-CM | POA: Insufficient documentation

## 2016-09-24 DIAGNOSIS — I120 Hypertensive chronic kidney disease with stage 5 chronic kidney disease or end stage renal disease: Secondary | ICD-10-CM | POA: Diagnosis not present

## 2016-09-24 DIAGNOSIS — G629 Polyneuropathy, unspecified: Secondary | ICD-10-CM | POA: Insufficient documentation

## 2016-09-24 DIAGNOSIS — M109 Gout, unspecified: Secondary | ICD-10-CM | POA: Insufficient documentation

## 2016-09-24 DIAGNOSIS — Y832 Surgical operation with anastomosis, bypass or graft as the cause of abnormal reaction of the patient, or of later complication, without mention of misadventure at the time of the procedure: Secondary | ICD-10-CM | POA: Insufficient documentation

## 2016-09-24 DIAGNOSIS — K219 Gastro-esophageal reflux disease without esophagitis: Secondary | ICD-10-CM | POA: Diagnosis not present

## 2016-09-24 DIAGNOSIS — Z992 Dependence on renal dialysis: Secondary | ICD-10-CM | POA: Diagnosis not present

## 2016-09-24 DIAGNOSIS — Z6841 Body Mass Index (BMI) 40.0 and over, adult: Secondary | ICD-10-CM | POA: Diagnosis not present

## 2016-09-24 DIAGNOSIS — Z87891 Personal history of nicotine dependence: Secondary | ICD-10-CM | POA: Diagnosis not present

## 2016-09-24 DIAGNOSIS — D631 Anemia in chronic kidney disease: Secondary | ICD-10-CM | POA: Diagnosis not present

## 2016-09-24 DIAGNOSIS — M1990 Unspecified osteoarthritis, unspecified site: Secondary | ICD-10-CM | POA: Diagnosis not present

## 2016-09-24 DIAGNOSIS — T82898A Other specified complication of vascular prosthetic devices, implants and grafts, initial encounter: Secondary | ICD-10-CM | POA: Diagnosis not present

## 2016-09-24 HISTORY — DX: Pleurisy: R09.1

## 2016-09-24 HISTORY — DX: Polyneuropathy, unspecified: G62.9

## 2016-09-24 HISTORY — PX: FISTULA SUPERFICIALIZATION: SHX6341

## 2016-09-24 HISTORY — DX: Pneumonia, unspecified organism: J18.9

## 2016-09-24 LAB — POCT I-STAT 4, (NA,K, GLUC, HGB,HCT)
GLUCOSE: 93 mg/dL (ref 65–99)
HEMATOCRIT: 35 % — AB (ref 39.0–52.0)
Hemoglobin: 11.9 g/dL — ABNORMAL LOW (ref 13.0–17.0)
Potassium: 4.5 mmol/L (ref 3.5–5.1)
Sodium: 140 mmol/L (ref 135–145)

## 2016-09-24 SURGERY — FISTULA SUPERFICIALIZATION
Anesthesia: General | Site: Arm Upper | Laterality: Left

## 2016-09-24 MED ORDER — LIDOCAINE 2% (20 MG/ML) 5 ML SYRINGE
INTRAMUSCULAR | Status: AC
  Filled 2016-09-24: qty 5

## 2016-09-24 MED ORDER — ONDANSETRON HCL 4 MG/2ML IJ SOLN
INTRAMUSCULAR | Status: DC | PRN
  Administered 2016-09-24: 4 mg via INTRAVENOUS

## 2016-09-24 MED ORDER — GLYCOPYRROLATE 0.2 MG/ML IV SOSY: PREFILLED_SYRINGE | INTRAVENOUS | Status: DC | PRN

## 2016-09-24 MED ORDER — FENTANYL CITRATE (PF) 250 MCG/5ML IJ SOLN
INTRAMUSCULAR | Status: DC | PRN
  Administered 2016-09-24: 150 ug via INTRAVENOUS

## 2016-09-24 MED ORDER — DEXTROSE 5 % IV SOLN
1.5000 g | INTRAVENOUS | Status: AC
  Administered 2016-09-24: 1.5 g via INTRAVENOUS
  Filled 2016-09-24: qty 1.5

## 2016-09-24 MED ORDER — SODIUM CHLORIDE 0.9 % IV SOLN
INTRAVENOUS | Status: DC | PRN
  Administered 2016-09-24: 500 mL

## 2016-09-24 MED ORDER — PHENYLEPHRINE 40 MCG/ML (10ML) SYRINGE FOR IV PUSH (FOR BLOOD PRESSURE SUPPORT)
PREFILLED_SYRINGE | INTRAVENOUS | Status: DC | PRN
  Administered 2016-09-24: 120 ug via INTRAVENOUS
  Administered 2016-09-24: 80 ug via INTRAVENOUS
  Administered 2016-09-24: 120 ug via INTRAVENOUS

## 2016-09-24 MED ORDER — OXYCODONE-ACETAMINOPHEN 5-325 MG PO TABS: 1.0000 | ORAL_TABLET | Freq: Four times a day (QID) | ORAL | 0 refills | Status: DC | PRN

## 2016-09-24 MED ORDER — PROPOFOL 10 MG/ML IV BOLUS
INTRAVENOUS | Status: DC | PRN
  Administered 2016-09-24: 200 mg via INTRAVENOUS

## 2016-09-24 MED ORDER — PROPOFOL 10 MG/ML IV BOLUS
INTRAVENOUS | Status: AC
  Filled 2016-09-24: qty 20

## 2016-09-24 MED ORDER — LIDOCAINE 2% (20 MG/ML) 5 ML SYRINGE
INTRAMUSCULAR | Status: DC | PRN
  Administered 2016-09-24: 100 mg via INTRAVENOUS

## 2016-09-24 MED ORDER — FENTANYL CITRATE (PF) 250 MCG/5ML IJ SOLN
INTRAMUSCULAR | Status: AC
  Filled 2016-09-24: qty 5

## 2016-09-24 MED ORDER — COLCHICINE 0.6 MG PO TABS: 0.3000 mg | ORAL_TABLET | Freq: Every day | ORAL | Status: DC | PRN

## 2016-09-24 MED ORDER — GLYCOPYRROLATE 0.2 MG/ML IV SOSY
PREFILLED_SYRINGE | INTRAVENOUS | Status: DC | PRN
  Administered 2016-09-24 (×2): .1 mg via INTRAVENOUS

## 2016-09-24 MED ORDER — ONDANSETRON HCL 4 MG/2ML IJ SOLN
INTRAMUSCULAR | Status: AC
  Filled 2016-09-24: qty 2

## 2016-09-24 MED ORDER — SODIUM CHLORIDE 0.9 % IV SOLN
INTRAVENOUS | Status: DC
  Administered 2016-09-24 (×2): via INTRAVENOUS

## 2016-09-24 MED ORDER — CHLORHEXIDINE GLUCONATE CLOTH 2 % EX PADS: 6.0000 | MEDICATED_PAD | Freq: Once | CUTANEOUS | Status: DC

## 2016-09-24 MED ORDER — PHENYLEPHRINE HCL 10 MG/ML IJ SOLN
INTRAMUSCULAR | Status: AC
  Filled 2016-09-24: qty 1

## 2016-09-24 MED ORDER — SUCCINYLCHOLINE CHLORIDE 200 MG/10ML IV SOSY
PREFILLED_SYRINGE | INTRAVENOUS | Status: AC
  Filled 2016-09-24: qty 10

## 2016-09-24 MED ORDER — PHENYLEPHRINE HCL 10 MG/ML IJ SOLN
INTRAVENOUS | Status: DC | PRN
  Administered 2016-09-24: 50 ug/min via INTRAVENOUS

## 2016-09-24 MED ORDER — 0.9 % SODIUM CHLORIDE (POUR BTL) OPTIME
TOPICAL | Status: DC | PRN
  Administered 2016-09-24: 1000 mL

## 2016-09-24 MED ORDER — SUCCINYLCHOLINE CHLORIDE 200 MG/10ML IV SOSY
PREFILLED_SYRINGE | INTRAVENOUS | Status: DC | PRN
  Administered 2016-09-24: 120 mg via INTRAVENOUS

## 2016-09-24 SURGICAL SUPPLY — 32 items
ADH SKN CLS APL DERMABOND .7 (GAUZE/BANDAGES/DRESSINGS) ×1
ARMBAND PINK RESTRICT EXTREMIT (MISCELLANEOUS) ×2 IMPLANT
CANISTER SUCT 3000ML PPV (MISCELLANEOUS) ×2 IMPLANT
CLIP VESOCCLUDE MED 6/CT (CLIP) ×2 IMPLANT
CLIP VESOCCLUDE SM WIDE 6/CT (CLIP) ×2 IMPLANT
COVER PROBE W GEL 5X96 (DRAPES) ×2 IMPLANT
DERMABOND ADVANCED (GAUZE/BANDAGES/DRESSINGS) ×1
DERMABOND ADVANCED .7 DNX12 (GAUZE/BANDAGES/DRESSINGS) ×1 IMPLANT
ELECT REM PT RETURN 9FT ADLT (ELECTROSURGICAL) ×2
ELECTRODE REM PT RTRN 9FT ADLT (ELECTROSURGICAL) ×1 IMPLANT
GLOVE BIOGEL PI IND STRL 7.5 (GLOVE) ×1 IMPLANT
GLOVE BIOGEL PI INDICATOR 7.5 (GLOVE) ×1
GLOVE SURG SS PI 7.5 STRL IVOR (GLOVE) ×2 IMPLANT
GOWN STRL REUS W/ TWL LRG LVL3 (GOWN DISPOSABLE) ×2 IMPLANT
GOWN STRL REUS W/ TWL XL LVL3 (GOWN DISPOSABLE) ×1 IMPLANT
GOWN STRL REUS W/TWL LRG LVL3 (GOWN DISPOSABLE) ×4
GOWN STRL REUS W/TWL XL LVL3 (GOWN DISPOSABLE) ×2
HEMOSTAT SNOW SURGICEL 2X4 (HEMOSTASIS) IMPLANT
KIT BASIN OR (CUSTOM PROCEDURE TRAY) ×2 IMPLANT
KIT ROOM TURNOVER OR (KITS) ×2 IMPLANT
NS IRRIG 1000ML POUR BTL (IV SOLUTION) ×2 IMPLANT
PACK CV ACCESS (CUSTOM PROCEDURE TRAY) ×2 IMPLANT
PAD ARMBOARD 7.5X6 YLW CONV (MISCELLANEOUS) ×4 IMPLANT
SUT PROLENE 6 0 CC (SUTURE) ×2 IMPLANT
SUT VIC AB 2-0 SH 27 (SUTURE) ×4
SUT VIC AB 2-0 SH 27XBRD (SUTURE) IMPLANT
SUT VIC AB 3-0 SH 27 (SUTURE) ×2
SUT VIC AB 3-0 SH 27X BRD (SUTURE) ×1 IMPLANT
SUT VIC AB 4-0 PS2 27 (SUTURE) ×2 IMPLANT
SUT VICRYL 4-0 PS2 18IN ABS (SUTURE) IMPLANT
UNDERPAD 30X30 (UNDERPADS AND DIAPERS) ×2 IMPLANT
WATER STERILE IRR 1000ML POUR (IV SOLUTION) ×1 IMPLANT

## 2016-09-24 NOTE — Interval H&P Note (Signed)
History and Physical Interval Note:  09/24/2016 7:25 AM  Richard Bush  has presented today for surgery, with the diagnosis of End Stage Renal Disease N18.6  The various methods of treatment have been discussed with the patient and family. After consideration of risks, benefits and other options for treatment, the patient has consented to  Procedure(s): FISTULA SUPERFICIALIZATION- LEFT ARM (Left) as a surgical intervention .  The patient's history has been reviewed, patient examined, no change in status, stable for surgery.  I have reviewed the patient's chart and labs.  Questions were answered to the patient's satisfaction.     Richard Bush, Richard Bush  No changes since clinic visit Palpable L BCF Plan for elevation.  Al questions answered.  Richard Bush

## 2016-09-24 NOTE — Anesthesia Procedure Notes (Signed)
Procedure Name: Intubation Date/Time: 09/24/2016 9:25 AM Performed by: Mervyn Gay Pre-anesthesia Checklist: Patient identified, Patient being monitored, Timeout performed, Emergency Drugs available and Suction available Patient Re-evaluated:Patient Re-evaluated prior to induction Oxygen Delivery Method: Circle System Utilized Preoxygenation: Pre-oxygenation with 100% oxygen Induction Type: IV induction Ventilation: Mask ventilation without difficulty, Oral airway inserted - appropriate to patient size and Two handed mask ventilation required Laryngoscope Size: Miller and 3 Grade View: Grade I Tube type: Oral Tube size: 8.0 mm Number of attempts: 1 Airway Equipment and Method: Stylet Placement Confirmation: ETT inserted through vocal cords under direct vision,  positive ETCO2 and breath sounds checked- equal and bilateral Secured at: 23 cm Tube secured with: Tape Dental Injury: Teeth and Oropharynx as per pre-operative assessment

## 2016-09-24 NOTE — H&P (View-Only) (Signed)
Patient name: Richard Bush MRN: [id number] DOB: 08-Feb-1951 Sex: male  REASON FOR VISIT:     Postop  HISTORY OF PRESENT ILLNESS:   Richard Bush is a 66 y.o. male who is status post left brachiocephalic history creation on 08/04/2016.  He is now on dialysis Monday Wednesday Friday.  He is using a catheter he does not have any signs of steal.  He does not have any arm swelling.  CURRENT MEDICATIONS:    Current Outpatient Prescriptions  Medication Sig Dispense Refill  . acetaminophen (TYLENOL) 500 MG tablet Take 500 mg by mouth 4 (four) times daily.    . calcitRIOL (ROCALTROL) 0.25 MCG capsule Take 0.25 mcg by mouth every morning. Take at kidney center    . calcium acetate (PHOSLO) 667 MG capsule Take 1 capsule (667 mg total) by mouth 3 (three) times daily with meals. 90 capsule 0  . cetirizine (ZYRTEC) 10 MG tablet Take 10 mg by mouth at bedtime.    . colchicine 0.6 MG tablet Take 1 tablet (0.6 mg total) by mouth daily as needed (for gout flareups). (Patient taking differently: Take 0.3 mg by mouth daily as needed (for gout flareups). ) 90 tablet 0  . Darbepoetin Alfa (ARANESP) 60 MCG/0.3ML SOSY injection Inject 0.3 mLs (60 mcg total) into the vein every Wednesday with hemodialysis. 4.2 mL   . loperamide (IMODIUM) 2 MG capsule Take by mouth as needed for diarrhea or loose stools.    Marland Kitchen omega-3 acid ethyl esters (LOVAZA) 1 G capsule Take 2 g by mouth 2 (two) times daily.    . ranitidine (ZANTAC) 150 MG tablet Take 150 mg by mouth daily.     . simvastatin (ZOCOR) 20 MG tablet Take 20 mg by mouth every morning.    . febuxostat (ULORIC) 40 MG tablet Take 1 tablet (40 mg total) by mouth daily. (Patient taking differently: Take 40 mg by mouth every other day. ) 90 tablet 1  . hydrALAZINE (APRESOLINE) 25 MG tablet Take 50 mg by mouth. Only take if diastolic gets above 496    . oxyCODONE-acetaminophen (ROXICET) 5-325 MG per tablet Take 1 tablet by mouth every 4 (four)  hours as needed. (Patient not taking: Reported on 07/15/2016) 60 tablet 0  . promethazine (PHENERGAN) 12.5 MG tablet Take 1 tablet (12.5 mg total) by mouth every 6 (six) hours as needed for nausea or vomiting. (Patient not taking: Reported on 09/14/2016) 30 tablet 0  . sodium bicarbonate 650 MG tablet Take 1 tablet (650 mg total) by mouth daily. (Patient not taking: Reported on 09/14/2016) 30 tablet 0   No current facility-administered medications for this visit.     REVIEW OF SYSTEMS:   [X]  denotes positive finding, [ ]  denotes negative finding Cardiac  Comments:  Chest pain or chest pressure:    Shortness of breath upon exertion:    Short of breath when lying flat:    Irregular heart rhythm:    Constitutional    Fever or chills:      PHYSICAL EXAM:   Vitals:   09/14/16 0851  BP: 125/70  Pulse: 72  Resp: 20  Temp: (!) 97.3 F (36.3 C)  TempSrc: Oral  SpO2: 92%  Weight: (!) 312 lb (141.5 kg)  Height: 5\' 11"  (1.803 m)    GENERAL: The patient is a well-nourished male, in no acute distress. The vital signs are documented above. CARDIOVASCULAR: There is a regular rate and rhythm. PULMONARY: Non-labored respirations Easily palpable thrill within the lower half of  his fistula.  I evaluated this with ultrasound myself and about the mid upper arm the vein was very deep  STUDIES:   Ultrasound shows excellent caliber vein ranging from 6 mm-1.2 cm.  It does appear to be deep in the mid arm   MEDICAL ISSUES:   I discussed proceeding with elevation of his fistula.  They've details of the surgery discussed with patient and his wife.  He is been scheduled for Thursday, August 9 for elevation of his left brachiocephalic fistula  [name], MD [facility] [phone] Pager [phone]

## 2016-09-24 NOTE — Telephone Encounter (Signed)
Sched appt 11/02/16 at 8:45. Lm on hm#.

## 2016-09-24 NOTE — Transfer of Care (Signed)
Immediate Anesthesia Transfer of Care Note  Patient: Richard Bush  Procedure(s) Performed: Procedure(s): FISTULA SUPERFICIALIZATION- LEFT ARM (Left)  Patient Location: PACU  Anesthesia Type:General  Level of Consciousness: awake, alert , oriented and patient cooperative  Airway & Oxygen Therapy: Patient Spontanous Breathing and Patient connected to face mask oxygen  Post-op Assessment: Report given to RN and Post -op Vital signs reviewed and stable  Post vital signs: Reviewed and stable  Last Vitals:  Vitals:   09/24/16 0731  BP: 134/68  Pulse: 88  Resp: 20  Temp: 36.6 C  SpO2: 96%    Last Pain: There were no vitals filed for this visit.    Patients Stated Pain Goal: 2 (02/89/02 2840)  Complications: No apparent anesthesia complications

## 2016-09-24 NOTE — Anesthesia Postprocedure Evaluation (Signed)
Anesthesia Post Note  Patient: Richard Bush  Procedure(s) Performed: Procedure(s) (LRB): FISTULA SUPERFICIALIZATION- LEFT ARM (Left)     Patient location during evaluation: PACU Anesthesia Type: General Level of consciousness: awake and alert Pain management: pain level controlled Vital Signs Assessment: post-procedure vital signs reviewed and stable Respiratory status: spontaneous breathing, nonlabored ventilation, respiratory function stable and patient connected to nasal cannula oxygen Cardiovascular status: blood pressure returned to baseline and stable Postop Assessment: no signs of nausea or vomiting Anesthetic complications: no    Last Vitals:  Vitals:   09/24/16 1117 09/24/16 1127  BP: 107/70 (!) 119/59  Pulse: 79 74  Resp: 13 14  Temp:    SpO2: 92% 97%    Last Pain: There were no vitals filed for this visit.               Ryan P Ellender

## 2016-09-24 NOTE — Telephone Encounter (Signed)
-----   Message from Mena Goes, RN sent at 09/24/2016 10:41 AM EDT ----- Regarding: 3-4 weeks   ----- Message ----- From: Gabriel Earing, PA-C Sent: 09/24/2016  10:36 AM To: Vvs Charge Pool  S/p left arm superficialization.  F/u with Dr. Trula Slade in 3-4 weeks.  Thanks

## 2016-09-24 NOTE — Discharge Instructions (Signed)
° °  [facility]  Discharge Instructions  AV Fistula or Graft Surgery for Dialysis Access  Please refer to the following instructions for your post-procedure care. Your surgeon or physician assistant will discuss any changes with you.  Activity  You may drive the day following your surgery, if you are comfortable and no longer taking prescription pain medication. Resume full activity as the soreness in your incision resolves.  Bathing/Showering  You may shower after you go home. Keep your incision dry for 48 hours. Do not soak in a bathtub, hot tub, or swim until the incision heals completely. You may not shower if you have a hemodialysis catheter.  Incision Care  Clean your incision with mild soap and water after 48 hours. Pat the area dry with a clean towel. You do not need a bandage unless otherwise instructed. Do not apply any ointments or creams to your incision. You may have skin glue on your incision. Do not peel it off. It will come off on its own in about one week. Your arm may swell a bit after surgery. To reduce swelling use pillows to elevate your arm so it is above your heart. Your doctor will tell you if you need to lightly wrap your arm with an ACE bandage.  Diet  Resume your normal diet. There are not special food restrictions following this procedure. In order to heal from your surgery, it is CRITICAL to get adequate nutrition. Your body requires vitamins, minerals, and protein. Vegetables are the best source of vitamins and minerals. Vegetables also provide the perfect balance of protein. Processed food has little nutritional value, so try to avoid this.  Medications  Resume taking all of your medications. If your incision is causing pain, you may take over-the counter pain relievers such as acetaminophen (Tylenol). If you were prescribed a stronger pain medication, please be aware these medications can cause nausea and constipation. Prevent  nausea by taking the medication with a snack or meal. Avoid constipation by drinking plenty of fluids and eating foods with high amount of fiber, such as fruits, vegetables, and grains. Do not take Tylenol if you are taking prescription pain medications.  Follow up Your surgeon may want to see you in the office following your access surgery. If so, this will be arranged at the time of your surgery.  Please call us immediately for any of the following conditions:  Increased pain, redness, drainage (pus) from your incision site Fever of 101 degrees or higher Severe or worsening pain at your incision site Hand pain or numbness.  Reduce your risk of vascular disease:  Stop smoking. If you would like help, call [facility] at 1-800-QUIT-NOW 956-714-5010) or [place] at [place] your cholesterol Maintain a desired weight Control your diabetes Keep your blood pressure down  Dialysis  It will take several weeks to several months for your new dialysis access to be ready for use. Your surgeon will determine when it is OK to use it. Your nephrologist will continue to direct your dialysis. You can continue to use your Permcath until your new access is ready for use.   09/24/2016 Richard Bush [id number] [birth year]  Surgeon(s): Serafina Mitchell, MD  Procedure(s): FISTULA SUPERFICIALIZATION- LEFT ARM  x Do not stick fistula for 8 weeks    If you have any questions, please call the office at [phone].

## 2016-09-24 NOTE — CV Procedure (Signed)
    Patient name: Richard Bush MRN: [id number] DOB: 1950-07-31 Sex: male  09/24/2016 Pre-operative Diagnosis: non-maturing left arm fistula Post-operative diagnosis:  Same Surgeon:  [name] Assistants:  nurse Procedure:   Elevation and branch ligation of left upper arm fistula Anesthesia:  Gen. Blood Loss:  See anesthesia record Specimens:  None  Findings:  Excellent vein  Indications:  The patient has previously undergone a left brachiocephalic fistula.  The fistula is too deep for use by ultrasound.  He is here today for elevation.  Procedure:  The patient was identified in the holding area and taken to [facility]  The patient was then placed supine on the table. general anesthesia was administered.  The patient was prepped and draped in the usual sterile fashion.  A time out was called and antibiotics were administered.  Ultrasound was used to map the course of the cephalic vein in the upper arm.  2 longitudinal incisions were made over the fistula.  A combination of cautery and sharp dissection were used to circumferentially exposed the fistula from antecubital crease up to the shoulder.  2 large branches were divided between 2-0 silk ties.  One small defect within the cephalic vein required repair with a 6-0 Prolene suture.  Once the fistula was fully mobilized, the subcutaneous taste tissue was reapproximated with interrupted 2-0 Vicryl suture.  The skin was then closed directly anterior to the fistula for Vicryl followed by Dermabond.  There were no immediate complications    Disposition:  to PACU in stable  V. [name], M.D. [facility] [place] Office: [phone] Pager:  [phone]

## 2016-09-25 ENCOUNTER — Encounter (HOSPITAL_COMMUNITY): Payer: Self-pay | Admitting: Surgery

## 2016-10-05 ENCOUNTER — Telehealth: Payer: Self-pay | Admitting: Rheumatology

## 2016-10-05 NOTE — Telephone Encounter (Signed)
Patient's wife called requesting refill of Uloric sent to [facility] in [place]. (3 month supply).

## 2016-10-06 MED ORDER — FEBUXOSTAT 40 MG PO TABS: 40.0000 mg | ORAL_TABLET | Freq: Every day | ORAL | 1 refills | Status: DC

## 2016-10-06 NOTE — Telephone Encounter (Signed)
ok 

## 2016-10-06 NOTE — Telephone Encounter (Signed)
Last Visit: 07/15/16 Next Visit: 11/17/16 Labs: 08/08/16 Creat . 4.51 GFR 12 Previous Creat 4.70 GFR 12  Okay to refill Uloric?

## 2016-11-02 ENCOUNTER — Ambulatory Visit (INDEPENDENT_AMBULATORY_CARE_PROVIDER_SITE_OTHER): Payer: Self-pay | Admitting: Surgery

## 2016-11-02 ENCOUNTER — Encounter: Payer: Self-pay | Admitting: Surgery

## 2016-11-02 VITALS — BP 117/64 | HR 66 | Temp 97.5°F | Resp 20 | Ht 71.0 in | Wt 316.0 lb

## 2016-11-02 DIAGNOSIS — N186 End stage renal disease: Secondary | ICD-10-CM

## 2016-11-02 DIAGNOSIS — Z992 Dependence on renal dialysis: Secondary | ICD-10-CM

## 2016-11-02 NOTE — Progress Notes (Signed)
   Patient name: Richard Bush MRN: [id number] DOB: [birth year] Sex: male  REASON FOR VISIT:     Follow-up  HISTORY OF PRESENT ILLNESS:   Richard Bush is a 66 y.o. male who is status post left brachiocephalic fistula creation on 08/04/2016.  He then went on to have elevation of his fistula on 09/24/2016.  He is on dialysis via a catheter.  He has no complaints of steal.  He has been putting antibiotic ointment on his upper incision.  He is not having any fevers.  CURRENT MEDICATIONS:    Current Outpatient Prescriptions  Medication Sig Dispense Refill  . acetaminophen (TYLENOL) 500 MG tablet Take 500 mg by mouth every 6 (six) hours as needed for mild pain or moderate pain.     . B Complex Vitamins (VITAMIN B COMPLEX PO) Take by mouth daily.    . calcium acetate (PHOSLO) 667 MG capsule Take 1 capsule (667 mg total) by mouth 3 (three) times daily with meals. 90 capsule 0  . cetirizine (ZYRTEC) 10 MG tablet Take 10 mg by mouth at bedtime.    . colchicine 0.6 MG tablet Take 0.5 tablets (0.3 mg total) by mouth daily as needed (for gout flareups).    . febuxostat (ULORIC) 40 MG tablet Take 1 tablet (40 mg total) by mouth daily. 90 tablet 1  . gabapentin (NEURONTIN) 100 MG capsule Take 200 mg by mouth at bedtime.    . hydrALAZINE (APRESOLINE) 25 MG tablet Take 50 mg by mouth daily as needed. Only take if diastolic gets above 443    . loperamide (IMODIUM) 2 MG capsule Take by mouth as needed for diarrhea or loose stools.    Marland Kitchen oxyCODONE-acetaminophen (ROXICET) 5-325 MG tablet Take 1 tablet by mouth every 6 (six) hours as needed for severe pain. 10 tablet 0  . ranitidine (ZANTAC) 150 MG tablet Take 150 mg by mouth every morning.     . simvastatin (ZOCOR) 20 MG tablet Take 20 mg by mouth every morning.     No current facility-administered medications for this visit.     REVIEW OF SYSTEMS:   [X]  denotes positive finding, [ ]  denotes negative finding Cardiac  Comments:    Chest pain or chest pressure:    Shortness of breath upon exertion:    Short of breath when lying flat:    Irregular heart rhythm:    Constitutional    Fever or chills:      PHYSICAL EXAM:   Vitals:   11/02/16 0838  BP: 117/64  Pulse: 66  Resp: 20  Temp: (!) 97.5 F (36.4 C)  SpO2: 94%  Weight: (!) 316 lb (143.3 kg)  Height: 5\' 11"  (1.803 m)    GENERAL: The patient is a well-nourished male, in no acute distress. The vital signs are documented above. CARDIOVASCULAR: There is a regular rate and rhythm. PULMONARY: Non-labored respirations Excellent thrill within fistula.  There is retained suture in the upper incision with some reactive tissue around it.  I removed the suture.  STUDIES:   None   MEDICAL ISSUES:   Excellent left basilic vein fistula.  This should be ready for use on Wednesday, 11/04/2016.  The patient will follow up on an as-needed basis.  [name], MD [facility] [phone] Pager [phone]

## 2016-11-03 NOTE — Progress Notes (Addendum)
Office Visit Note  Patient: Richard Bush             Date of Birth: January 27, 1951           MRN: [id number]             PCP: Quentin Cornwall, MD Referring: Quentin Cornwall, MD Visit Date: 11/17/2016 Occupation: @GUAROCC @    Subjective:  Right hip and feet pain.   History of Present Illness: Richard Bush is a 66 y.o. male with history of gouty arthropathy and osteoarthritis. He states he continues to have some discomfort in his right hip which is been replaced. He also has some chronic discomfort in his feet due to osteoarthritis. He had few episodes of gout in May. He's been on dialysis since 08/03/2016.  Activities of Daily Living:  Patient reports morning stiffness for 5  minutes.   Patient Reports nocturnal pain.  Difficulty dressing/grooming: Denies Difficulty climbing stairs: Denies Difficulty getting out of chair: Denies Difficulty using hands for taps, buttons, cutlery, and/or writing: Denies   Review of Systems  Constitutional: Negative for fatigue, night sweats and weakness ( ).  HENT: Negative for mouth sores, mouth dryness and nose dryness.   Eyes: Positive for visual disturbance. Negative for redness and dryness.  Respiratory: Negative for cough, shortness of breath and difficulty breathing.   Cardiovascular: Negative for chest pain, palpitations, hypertension, irregular heartbeat and swelling in legs/feet.  Gastrointestinal: Negative for blood in stool, constipation and diarrhea.  Endocrine: Negative for increased urination.  Genitourinary: Positive for decreased urine output. Negative for hematuria and urgency.  Musculoskeletal: Positive for arthralgias, joint pain, muscle weakness, morning stiffness and muscle tenderness. Negative for joint swelling, myalgias and myalgias.  Skin: Negative for color change, rash, hair loss, nodules/bumps, skin tightness, ulcers and sensitivity to sunlight.  Allergic/Immunologic: Negative for susceptible to infections.  Neurological:  Negative for dizziness, fainting, light-headedness, numbness, memory loss and night sweats.  Hematological: Negative for swollen glands.  Psychiatric/Behavioral: Positive for sleep disturbance. Negative for depressed mood. The patient is not nervous/anxious.     PMFS History:  Patient Active Problem List   Diagnosis Date Noted  . Pressure injury of skin 08/04/2016  . 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 Anemia of renal disease 08/04/2016  . Pulmonary nodule 08/03/2016  . ESRD (end stage renal disease) ([place]) 08/03/2016  . Right bundle branch block 08/03/2016  . History of gastroesophageal reflux (GERD) 07/13/2016  . Idiopathic chronic gout without tophus 07/03/2016  . Primary osteoarthritis of left knee 07/03/2016  . Stage 4 chronic kidney disease ([place]) 07/03/2016  . Osteoarthritis of hand 07/03/2016  . Hypertension 07/03/2016  . Hyperparathyroidism ([place]) 07/03/2016  . Primary osteoarthritis of both feet 07/03/2016  . History of total hip replacement, right 07/03/2016  . Hx of total knee replacement, bilateral 07/03/2016    Past Medical History:  Diagnosis Date  . Anemia   . Arthritis   . Chronic kidney disease    dialysis M/W/F  . GERD (gastroesophageal reflux disease)   . Gout   . Hypertension   . Neuropathy   . Pleurisy   . Pneumonia     Family History  Problem Relation Age of Onset  . Heart disease Mother   . Cancer Father    Past Surgical History:  Procedure Laterality Date  . APPENDECTOMY  1963  . AV FISTULA PLACEMENT Left 08/04/2016   Procedure: ARTERIOVENOUS (AV) FISTULA CREATION/LEFT ARM;  Surgeon: Serafina Mitchell, MD;  Location: [facility];  Service: Vascular;  Laterality: Left;  . CARPAL TUNNEL RELEASE Bilateral 2006 right and 2007 left   . CHOLECYSTECTOMY  1979  . COLONOSCOPY    . FISTULA SUPERFICIALIZATION Left 09/24/2016   Procedure: FISTULA SUPERFICIALIZATION- LEFT ARM;  Surgeon: Serafina Mitchell, MD;  Location: [facility];  Service: Vascular;   Laterality: Left;  . FOOT SURGERY Left 1981  . INSERTION OF DIALYSIS CATHETER Left 08/04/2016   Procedure: INSERTION OF DIALYSIS CATHETER;  Surgeon: Serafina Mitchell, MD;  Location: [facility];  Service: Vascular;  Laterality: Left;  . JOINT REPLACEMENT Right 2008   . JOINT REPLACEMENT Left 2010  . LUMBAR LAMINECTOMY/DECOMPRESSION MICRODISCECTOMY N/A 08/18/2012   Procedure: LUMBAR LAMINECTOMY/DECOMPRESSION MICRODISCECTOMY 3 LEVELS;  Surgeon: Hosie Spangle, MD;  Location: [facility];  Service: Neurosurgery;  Laterality: N/A;  Lumbar Two through Five Laminectomy  . SHOULDER ARTHROSCOPY WITH ROTATOR CUFF REPAIR Right 2001  . TOTAL HIP ARTHROPLASTY Right 07/26/2013   Procedure: TOTAL HIP ARTHROPLASTY;  Surgeon: Kerin [place], MD;  Location: [facility];  Service: Orthopedics;  Laterality: Right;  . WRIST TENDON TRANSFER Bilateral 1972 left and 1973 right   Social History   Social History Narrative  . No narrative on file     Objective: Vital Signs: BP (!) 115/54   Pulse 72   Resp 14   Ht 5\' 11"  (1.803 m)   Wt (!) 308 lb (139.7 kg)   BMI 42.96 kg/m    Physical Exam  Constitutional: He is oriented to person, place, and time. He appears well-developed and well-nourished.  HENT:  Head: Normocephalic and atraumatic.  Eyes: Pupils are equal, round, and reactive to light. Conjunctivae and EOM are normal.  Neck: Normal range of motion. Neck supple.  Cardiovascular: Normal rate, regular rhythm and normal heart sounds.   Fistula in left arm  Pulmonary/Chest: Effort normal and breath sounds normal.  Abdominal: Soft. Bowel sounds are normal.  Neurological: He is alert and oriented to person, place, and time.  Skin: Skin is warm and dry. Capillary refill takes less than 2 seconds.  Psychiatric: He has a normal mood and affect. His behavior is normal.  Nursing note and vitals reviewed.    Musculoskeletal Exam: C-spine and thoracic lumbar spine limited range of motion. Shoulder joints elbow joints  wrist joints are good range of motion. He has osteoarthritic changes in his hands with DIP PIP thickening. His right hip joint is replaced with limited range of motion. Bilateral total knee replacement appears to be doing well. He has some osteoarthritis in his feet but no synovitis.  CDAI Exam: No CDAI exam completed.    Investigation: No additional findings. CBC Latest Ref Rng & Units 11/12/2016 09/24/2016 08/07/2016  WBC 3.4 - 10.8 x10E3/uL 5.3 - 7.0  Hemoglobin 13.0 - 17.7 g/dL 11.5(L) 11.9(L) 9.5(L)  Hematocrit 37.5 - 51.0 % 34.9(L) 35.0(L) 30.9(L)  Platelets 150 - 379 x10E3/uL 202 - 181    CMP Latest Ref Rng & Units 11/12/2016 09/24/2016 08/08/2016  Glucose 65 - 99 mg/dL 106(H) 93 89  BUN 8 - 27 mg/dL 46(H) - 31(H)  Creatinine 0.76 - 1.27 mg/dL 7.73(H) - 4.51(H)  Sodium 134 - 144 mmol/L 145(H) 140 134(L)  Potassium 3.5 - 5.2 mmol/L 4.7 4.5 3.1(L)  Chloride 96 - 106 mmol/L 96 - 97(L)  CO2 20 - 29 mmol/L 29 - 26  Calcium 8.6 - 10.2 mg/dL 9.3 - 8.6(L)  Total Protein 6.0 - 8.5 g/dL 7.6 - -  Total Bilirubin 0.0 - 1.2  mg/dL 0.3 - -  Alkaline Phos 39 - 117 IU/L 55 - -  AST 0 - 40 IU/L 23 - -  ALT 0 - 44 IU/L 22 - -   Imaging: No results found.  Speciality Comments: No specialty comments available.    Procedures:  No procedures performed Allergies: Cozaar [losartan potassium]; Kayexalate [polystyrene]; Altace [ramipril]; Other; Cholestyramine light; and Sulfa antibiotics   Assessment / Plan:     Visit Diagnoses: Chronic gout due to renal impairment of multiple sites without tophus - Uloric 40 mg by mouth daily, colchicine 0.3 mg by mouth daily prn. His uric acid isn't desirable range. He will continue on current dose of Uloric. I have also advised him to review with nephrologist.  Primary osteoarthritis of both hands: Joint protection and muscle strengthening discussed.  History of total hip replacement, right: He has some ongoing discomfort.  History of total knee arthroplasty,  bilateral: Doing well  Primary osteoarthritis of both feet: He has chronic pain.  History of renal disease (ESRD): He is on dialysis now which is on Monday, Wednesday and Friday. He Will Be Getting Labs with Dialysis. I've Advised him to get labs forwarded [place]. We Will Check Uric Acid Levels in 6 Months.  Increased Muscle Spasm: Discussed the Use of Magnesium malate 50 Mg at Bedtime. I will advised to discuss that use with his nephrologist as well.  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  No orders of the defined types were placed in this encounter.   Face-to-face time spent with patient was 30 minutes. Greater than 50% of time was spent in counseling and coordination of care.  Follow-Up Instructions: Return in about 6 months (around 05/18/2017) for Gout, osteoarthritis .   Bo Merino, MD  Note - This record has been created using Editor, commissioning.  Chart creation errors have been sought, but may not always  have been located. Such creation errors do not reflect on  the standard of medical care.

## 2016-11-12 ENCOUNTER — Other Ambulatory Visit: Payer: Self-pay | Admitting: Rheumatology

## 2016-11-12 ENCOUNTER — Telehealth: Payer: Self-pay | Admitting: Rheumatology

## 2016-11-12 ENCOUNTER — Telehealth: Payer: Self-pay | Admitting: Radiology

## 2016-11-12 DIAGNOSIS — M1A09X0 Idiopathic chronic gout, multiple sites, without tophus (tophi): Secondary | ICD-10-CM

## 2016-11-12 DIAGNOSIS — Z79899 Other long term (current) drug therapy: Secondary | ICD-10-CM

## 2016-11-12 DIAGNOSIS — Z5181 Encounter for therapeutic drug level monitoring: Secondary | ICD-10-CM

## 2016-11-12 NOTE — Telephone Encounter (Signed)
Patient at [facility] now. Lab has questions about order.

## 2016-11-12 NOTE — Telephone Encounter (Signed)
Released CBC Uric Acid / put in CMP orders,pt at [facility] now.

## 2016-11-12 NOTE — Telephone Encounter (Signed)
Spoke with Angie and she states they were able to draw labs and do not have any questions.

## 2016-11-13 LAB — CMP14+EGFR
ALT: 22 IU/L (ref 0–44)
AST: 23 IU/L (ref 0–40)
Albumin/Globulin Ratio: 1.5 (ref 1.2–2.2)
Albumin: 4.5 g/dL (ref 3.6–4.8)
Alkaline Phosphatase: 55 IU/L (ref 39–117)
BILIRUBIN TOTAL: 0.3 mg/dL (ref 0.0–1.2)
BUN/Creatinine Ratio: 6 — ABNORMAL LOW (ref 10–24)
BUN: 46 mg/dL — AB (ref 8–27)
CHLORIDE: 96 mmol/L (ref 96–106)
CO2: 29 mmol/L (ref 20–29)
Calcium: 9.3 mg/dL (ref 8.6–10.2)
Creatinine, Ser: 7.73 mg/dL — ABNORMAL HIGH (ref 0.76–1.27)
GFR calc non Af Amer: 7 mL/min/{1.73_m2} — ABNORMAL LOW (ref >59)
GFR, EST AFRICAN AMERICAN: 8 mL/min/{1.73_m2} — AB (ref >59)
GLUCOSE: 106 mg/dL — AB (ref 65–99)
Globulin, Total: 3.1 g/dL (ref 1.5–4.5)
Potassium: 4.7 mmol/L (ref 3.5–5.2)
Sodium: 145 mmol/L — ABNORMAL HIGH (ref 134–144)
TOTAL PROTEIN: 7.6 g/dL (ref 6.0–8.5)

## 2016-11-13 LAB — URIC ACID: URIC ACID: 2.4 mg/dL — AB (ref 3.7–8.6)

## 2016-11-13 LAB — CBC WITH DIFFERENTIAL/PLATELET
BASOS ABS: 0 10*3/uL (ref 0.0–0.2)
Basos: 0 %
EOS (ABSOLUTE): 0.2 10*3/uL (ref 0.0–0.4)
Eos: 4 %
HEMOGLOBIN: 11.5 g/dL — AB (ref 13.0–17.7)
Hematocrit: 34.9 % — ABNORMAL LOW (ref 37.5–51.0)
IMMATURE GRANS (ABS): 0 10*3/uL (ref 0.0–0.1)
Immature Granulocytes: 0 %
LYMPHS: 39 %
Lymphocytes Absolute: 2.1 10*3/uL (ref 0.7–3.1)
MCH: 31.8 pg (ref 26.6–33.0)
MCHC: 33 g/dL (ref 31.5–35.7)
MCV: 96 fL (ref 79–97)
MONOCYTES: 6 %
Monocytes Absolute: 0.3 10*3/uL (ref 0.1–0.9)
NEUTROS PCT: 51 %
Neutrophils Absolute: 2.6 10*3/uL (ref 1.4–7.0)
PLATELETS: 202 10*3/uL (ref 150–379)
RBC: 3.62 x10E6/uL — AB (ref 4.14–5.80)
RDW: 15 % (ref 12.3–15.4)
WBC: 5.3 10*3/uL (ref 3.4–10.8)

## 2016-11-13 NOTE — Progress Notes (Signed)
His creatinine is high. Please contact patient to notify about the value. Please contact his nephrologist office to get the results. He should reduce his Uloric to half a tablet daily for right now. If he starts dialysis then we may have to readjust his medications.

## 2016-11-16 ENCOUNTER — Ambulatory Visit: Payer: Medicare (Managed Care) | Admitting: Rheumatology

## 2016-11-17 ENCOUNTER — Encounter: Payer: Self-pay | Admitting: Rheumatology

## 2016-11-17 ENCOUNTER — Ambulatory Visit (INDEPENDENT_AMBULATORY_CARE_PROVIDER_SITE_OTHER): Payer: Medicare Other | Admitting: Rheumatology

## 2016-11-17 VITALS — BP 115/54 | HR 72 | Resp 14 | Ht 71.0 in | Wt 308.0 lb

## 2016-11-17 DIAGNOSIS — M19072 Primary osteoarthritis, left ankle and foot: Secondary | ICD-10-CM | POA: Diagnosis not present

## 2016-11-17 DIAGNOSIS — Z96653 Presence of artificial knee joint, bilateral: Secondary | ICD-10-CM | POA: Diagnosis not present

## 2016-11-17 DIAGNOSIS — M19042 Primary osteoarthritis, left hand: Secondary | ICD-10-CM

## 2016-11-17 DIAGNOSIS — M1A39X0 Chronic gout due to renal impairment, multiple sites, without tophus (tophi): Secondary | ICD-10-CM | POA: Diagnosis not present

## 2016-11-17 DIAGNOSIS — M19041 Primary osteoarthritis, right hand: Secondary | ICD-10-CM

## 2016-11-17 DIAGNOSIS — M19071 Primary osteoarthritis, right ankle and foot: Secondary | ICD-10-CM | POA: Diagnosis not present

## 2016-11-17 DIAGNOSIS — Z8719 Personal history of other diseases of the digestive system: Secondary | ICD-10-CM | POA: Diagnosis not present

## 2016-11-17 DIAGNOSIS — N2581 Secondary hyperparathyroidism of renal origin: Secondary | ICD-10-CM

## 2016-11-17 DIAGNOSIS — Z87448 Personal history of other diseases of urinary system: Secondary | ICD-10-CM

## 2016-11-17 DIAGNOSIS — Z96641 Presence of right artificial hip joint: Secondary | ICD-10-CM | POA: Diagnosis not present

## 2016-11-17 NOTE — Patient Instructions (Addendum)
Uric acid in March.  Discuss Magnesium malate  250 mg by mouth dailywith your nephrologist .

## 2016-12-24 ENCOUNTER — Ambulatory Visit (HOSPITAL_COMMUNITY): Admit: 2016-12-24 | Payer: Medicare Other | Admitting: Vascular Surgery

## 2016-12-24 ENCOUNTER — Encounter (HOSPITAL_COMMUNITY): Payer: Self-pay | Admitting: Emergency Medicine

## 2016-12-24 ENCOUNTER — Other Ambulatory Visit: Payer: Self-pay

## 2016-12-24 ENCOUNTER — Encounter (HOSPITAL_COMMUNITY)
Admission: EM | Disposition: A | Discharge status: Home / Self Care | Payer: Self-pay | Source: Home / Self Care | Attending: Emergency Medicine

## 2016-12-24 ENCOUNTER — Emergency Department (HOSPITAL_COMMUNITY)
Admission: EM | Admit: 2016-12-24 | Discharge: 2016-12-24 | Disposition: A | Discharge status: Home / Self Care | Payer: Medicare Other | Attending: Emergency Medicine | Admitting: Emergency Medicine

## 2016-12-24 DIAGNOSIS — M109 Gout, unspecified: Secondary | ICD-10-CM | POA: Diagnosis not present

## 2016-12-24 DIAGNOSIS — Z9660 Presence of unspecified orthopedic joint implant: Secondary | ICD-10-CM | POA: Diagnosis not present

## 2016-12-24 DIAGNOSIS — K219 Gastro-esophageal reflux disease without esophagitis: Secondary | ICD-10-CM | POA: Diagnosis not present

## 2016-12-24 DIAGNOSIS — F17210 Nicotine dependence, cigarettes, uncomplicated: Secondary | ICD-10-CM | POA: Insufficient documentation

## 2016-12-24 DIAGNOSIS — Z882 Allergy status to sulfonamides status: Secondary | ICD-10-CM | POA: Insufficient documentation

## 2016-12-24 DIAGNOSIS — Z992 Dependence on renal dialysis: Secondary | ICD-10-CM | POA: Insufficient documentation

## 2016-12-24 DIAGNOSIS — N186 End stage renal disease: Secondary | ICD-10-CM | POA: Diagnosis not present

## 2016-12-24 DIAGNOSIS — Z79899 Other long term (current) drug therapy: Secondary | ICD-10-CM | POA: Insufficient documentation

## 2016-12-24 DIAGNOSIS — I120 Hypertensive chronic kidney disease with stage 5 chronic kidney disease or end stage renal disease: Secondary | ICD-10-CM | POA: Insufficient documentation

## 2016-12-24 DIAGNOSIS — G629 Polyneuropathy, unspecified: Secondary | ICD-10-CM | POA: Insufficient documentation

## 2016-12-24 DIAGNOSIS — Z888 Allergy status to other drugs, medicaments and biological substances status: Secondary | ICD-10-CM | POA: Insufficient documentation

## 2016-12-24 DIAGNOSIS — T82838A Hemorrhage of vascular prosthetic devices, implants and grafts, initial encounter: Secondary | ICD-10-CM | POA: Diagnosis not present

## 2016-12-24 HISTORY — PX: A/V FISTULAGRAM: CATH118298

## 2016-12-24 SURGERY — A/V FISTULAGRAM
Anesthesia: LOCAL | Laterality: Left

## 2016-12-24 MED ORDER — HEPARIN (PORCINE) IN NACL 2-0.9 UNIT/ML-% IJ SOLN
INTRAMUSCULAR | Status: AC | PRN
  Administered 2016-12-24: 500 mL

## 2016-12-24 MED ORDER — LIDOCAINE HCL (PF) 1 % IJ SOLN
INTRAMUSCULAR | Status: DC | PRN
  Administered 2016-12-24: 2 mL

## 2016-12-24 MED ORDER — LIDOCAINE HCL (PF) 1 % IJ SOLN
INTRAMUSCULAR | Status: AC
  Filled 2016-12-24: qty 30

## 2016-12-24 MED ORDER — IODIXANOL 320 MG/ML IV SOLN
INTRAVENOUS | Status: DC | PRN
  Administered 2016-12-24: 30 mL via INTRAVENOUS

## 2016-12-24 MED ORDER — HEPARIN (PORCINE) IN NACL 2-0.9 UNIT/ML-% IJ SOLN
INTRAMUSCULAR | Status: AC
  Filled 2016-12-24: qty 500

## 2016-12-24 SURGICAL SUPPLY — 10 items
BAG SNAP BAND KOVER 36X36 (MISCELLANEOUS) ×2 IMPLANT
COVER DOME SNAP 22 D (MISCELLANEOUS) ×2 IMPLANT
COVER PRB 48X5XTLSCP FOLD TPE (BAG) ×1 IMPLANT
COVER PROBE 5X48 (BAG) ×6
KIT MICROINTRODUCER STIFF 5F (SHEATH) ×3 IMPLANT
PROTECTION STATION PRESSURIZED (MISCELLANEOUS) ×4
STATION PROTECTION PRESSURIZED (MISCELLANEOUS) ×1 IMPLANT
STOPCOCK MORSE 400PSI 3WAY (MISCELLANEOUS) ×2 IMPLANT
TRAY PV CATH (CUSTOM PROCEDURE TRAY) ×2 IMPLANT
TUBING CIL FLEX 10 FLL-RA (TUBING) ×3 IMPLANT

## 2016-12-24 NOTE — ED Notes (Signed)
Patient transported to Cath Lab #7 via wheelchair.

## 2016-12-24 NOTE — ED Provider Notes (Signed)
[facility] EMERGENCY DEPARTMENT Provider Note   CSN: [id number] Arrival date & time: 12/24/16  1130     History   Chief Complaint Chief Complaint  Richard Bush presents with  . Vascular Access Problem    HPI Richard Bush is a 66 y.o. male.  HPI   65 year old male with hx of ESRD on M/W/F dialysis sent here via EMS from [facility] for a bleeding fistula.  Pt had dialysis yesterday.  He finished with his 4 hours dialysis and when the staff remove the IV access it started to bleed.  Pressure dressing was applied and Richard Bush was monitored for several hours.  He was sent home with a pressure dressing and was told to remove it in the morning to assess the bleeding.  This morning when he removed the dressing, it begins to bleed again.  Richard Bush went to the ER and the provider placed several sutures to the affected area and applied another pressure dressing.  Richard Bush sent to our ER for further evaluation of his bleeding.  Since arriving to the ER, bleeding has stopped completely.  Richard Bush denies any increasing arm pain or numbness.  No chest pain or trouble breathing.  No other complaint.  He makes minimal urine.  Past Medical History:  Diagnosis Date  . Anemia   . Arthritis   . Chronic kidney disease    dialysis M/W/F  . GERD (gastroesophageal reflux disease)   . Gout   . Hypertension   . Neuropathy   . Pleurisy   . Pneumonia     Richard Bush Active Problem List   Diagnosis Date Noted  . Pressure injury of skin 08/04/2016  . 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 Anemia of renal disease 08/04/2016  . Pulmonary nodule 08/03/2016  . ESRD (end stage renal disease) ([place]) 08/03/2016  . Right bundle branch block 08/03/2016  . History of gastroesophageal reflux (GERD) 07/13/2016  . Idiopathic chronic gout without tophus 07/03/2016  . Primary osteoarthritis of left knee 07/03/2016  . Stage 4 chronic kidney disease ([place]) 07/03/2016  . Osteoarthritis  of hand 07/03/2016  . Hypertension 07/03/2016  . Hyperparathyroidism ([place]) 07/03/2016  . Primary osteoarthritis of both feet 07/03/2016  . History of total hip replacement, right 07/03/2016  . Hx of total knee replacement, bilateral 07/03/2016    Past Surgical History:  Procedure Laterality Date  . APPENDECTOMY  1963  . CARPAL TUNNEL RELEASE Bilateral 2006 right and 2007 left   . CHOLECYSTECTOMY  1979  . COLONOSCOPY    . FOOT SURGERY Left 1981  . JOINT REPLACEMENT Right 2008   . JOINT REPLACEMENT Left 2010  . SHOULDER ARTHROSCOPY WITH ROTATOR CUFF REPAIR Right 2001  . WRIST TENDON TRANSFER Bilateral 1972 left and 1973 right       Home Medications    Prior to Admission medications   Medication Sig Start Date End Date Taking? Authorizing Provider  acetaminophen (TYLENOL) 500 MG tablet Take 500 mg by mouth every 12 (twelve) hours as needed for mild pain or moderate pain.     [provider]  B Complex Vitamins (VITAMIN B COMPLEX PO) Take by mouth daily.    [provider]  calcium acetate (PHOSLO) 667 MG capsule Take 1 capsule (667 mg total) by mouth 3 (three) times daily with meals. Richard Bush taking differently: Take 2,001 mg by mouth 3 (three) times daily with meals.  08/08/16   Ledell Noss, MD  cetirizine (ZYRTEC) 10 MG tablet Take 10 mg  by mouth at bedtime.    [provider]  colchicine 0.6 MG tablet Take 0.5 tablets (0.3 mg total) by mouth daily as needed (for gout flareups). 09/24/16 12/23/16  Rhyne, Hulen Shouts, PA-C  febuxostat (ULORIC) 40 MG tablet Take 1 tablet (40 mg total) by mouth daily. 10/06/16 04/04/17  Bo Merino, MD  gabapentin (NEURONTIN) 100 MG capsule Take 300 mg by mouth at bedtime.     [provider]  hydrALAZINE (APRESOLINE) 25 MG tablet Take 50 mg by mouth daily as needed. Only take if diastolic gets above 361    [provider]  loperamide (IMODIUM) 2 MG capsule Take by mouth as needed for diarrhea or loose  stools.    [provider]  oxyCODONE-acetaminophen (ROXICET) 5-325 MG tablet Take 1 tablet by mouth every 6 (six) hours as needed for severe pain. Richard Bush not taking: Reported on 11/17/2016 09/24/16   Gabriel Earing, PA-C  ranitidine (ZANTAC) 150 MG tablet Take 150 mg by mouth every morning.     [provider]  simvastatin (ZOCOR) 20 MG tablet Take 20 mg by mouth every morning.    [provider]    Family History Family History  Problem Relation Age of Onset  . Heart disease Mother   . Cancer Father     Social History Social History   Tobacco Use  . Smoking status: Former Smoker    Packs/day: 1.00    Years: 10.00    Pack years: 10.00    Types: Cigarettes    Last attempt to quit: 08/13/1982    Years since quitting: 34.3  . Smokeless tobacco: Former Network engineer Use Topics  . Alcohol use: Yes    Comment: rarely  . Drug use: No     Allergies   Cozaar [losartan potassium]; Kayexalate [polystyrene]; Altace [ramipril]; Other; Cholestyramine light; and Sulfa antibiotics   Review of Systems Review of Systems  Constitutional: Negative for fever.  Skin: Positive for wound.  Neurological: Negative for numbness.     Physical Exam Updated Vital Signs BP (!) 100/54 (BP Location: Right Arm)   Pulse 84   Temp 98.1 F (36.7 C) (Oral)   Resp 16   Ht 5\' 11"  (1.803 m)   Wt 136.1 kg (300 lb)   SpO2 97%   BMI 41.84 kg/m   Physical Exam  Constitutional: He appears well-developed and well-nourished. No distress.  HENT:  Head: Atraumatic.  Eyes: Conjunctivae are normal.  Neck: Neck supple.  Cardiovascular: Normal rate and regular rhythm.  Pulmonary/Chest: Effort normal and breath sounds normal. He has no rales.  Abdominal: Soft.  Neurological: He is alert.  Skin: No rash noted.  AV fistula in right upper arm with sutures in place, not actively bleeding, nontender to palpation, good bruits and thrills to the fistula.  Forearm compartment is  soft, radial pulse 2+, normal grip strength.  Psychiatric: He has a normal mood and affect.  Nursing note and vitals reviewed.    ED Treatments / Results  Labs (all labs ordered are listed, but only abnormal results are displayed) Labs Reviewed - No data to display  EKG  EKG Interpretation None       Radiology No results found.  Procedures Procedures (including critical care time)  Medications Ordered in ED Medications - No data to display   Initial Impression / Assessment and Plan / ED Course  I have reviewed the triage vital signs and the nursing notes.  Pertinent labs & imaging results that  were available during my care of the Richard Bush were reviewed by me and considered in my medical decision making (see chart for details).     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  Bleeding has stopped after sutures were placed and Richard Bush has a strong palpable thrill.  Vascular surgeon Dr. Kasandra Knudsen did evaluate Richard Bush.  He mention concern for central obstruction in which Richard Bush will proceed with a fistulogram in the left upper extremity and possible intervention.   Domenic Moras, PA-C 12/24/16 1350    Veryl Speak, MD 12/24/16 [phone]

## 2016-12-24 NOTE — Discharge Instructions (Signed)

## 2016-12-24 NOTE — ED Triage Notes (Signed)
Patient arrived to ED via transport service from [facility]. Patient is a dialysis patient (M,W,F). Has dialysis in [place].  Yesterday, patient was bleeding from fistula in L upper arm. Bleeding stopped with pressure. Bleeding started again this morning. Three stitches placed at [facility] and pressure bandage applied. Wrapped with coban.  No bleeding at arrival to ED.  BP 138/72, Pulse 85, Resp 20, 96% on room air. Denies pain.

## 2016-12-24 NOTE — Op Note (Signed)
    Patient name: Richard Bush MRN: [id number] DOB: 20-Jan-1951 Sex: male  12/24/2016 Pre-operative Diagnosis: esrd, bleeding left arm avf Post-operative diagnosis:  Same Surgeon:  Eda Paschal. Donzetta Matters, MD Procedure Performed: 1.  US guided cannulation of left arm avf 2.  Left arm fistulogram  Indications:  66yo male with esrd, he bled from his avf today and it was sutured closed in the ED in [place], [place] satisfactorily. He is now indicated for fistulogram  Findings: there is no flow limiting stenosis The fistula can be cannulated above and below the recently sutured area. Sutures to remain for 2 weeks.    Procedure:  The patient was identified in the holding area and taken to room 8.  The patient was then placed supine on the table and prepped and draped in the usual sterile fashion.  A time out was called.  Ultrasound was used to evaluate the left arm avf. It was then cannulated with US guidance and a micro sheath was placed. fistulogram was performed with findings of normal avf. Retrograde injection demonstrated widely patent anastomosis. The sheath was then removed and closed with 4-0 monocryl suture. He tolerated procedure well without complication.    Hideo Googe C. Donzetta Matters, MD [facility] Office: [phone] Pager: [phone]

## 2016-12-24 NOTE — Consult Note (Signed)
Hospital Consult    Reason for Consult:  Bleeding avf Referring Physician:  ED MRN #:  [id number]  History of Present Illness: This is a 66 y.o. male with history of end-stage renal disease on dialysis on Monday Wednesday Friday via a left arm second stage basilic vein transposition fistula.  Yesterday after dialysis he had some bleeding and a dressing was placed.  This morning he woke up again to significant bleeding pressure was held this was sutured at an outside emergency department.  He now presents with out left upper extremity pain no further bleeding since sutures were placed.  He has no left arm complaints.  He is currently medically stable.  Past Medical History:  Diagnosis Date  . Anemia   . Arthritis   . Chronic kidney disease    dialysis M/W/F  . GERD (gastroesophageal reflux disease)   . Gout   . Hypertension   . Neuropathy   . Pleurisy   . Pneumonia     Past Surgical History:  Procedure Laterality Date  . APPENDECTOMY  1963  . CARPAL TUNNEL RELEASE Bilateral 2006 right and 2007 left   . CHOLECYSTECTOMY  1979  . COLONOSCOPY    . FOOT SURGERY Left 1981  . JOINT REPLACEMENT Right 2008   . JOINT REPLACEMENT Left 2010  . SHOULDER ARTHROSCOPY WITH ROTATOR CUFF REPAIR Right 2001  . WRIST TENDON TRANSFER Bilateral 1972 left and 1973 right    Allergies  Allergen Reactions  . Cozaar [Losartan Potassium] Other (See Comments)    Stopped due to kidney decline 4/18  . Kayexalate [Polystyrene] Other (See Comments)    Abnormal ekg  . Altace [Ramipril] Cough    Chronic cough  . Other Swelling    "Symvisc"   . Cholestyramine Light Other (See Comments)    UNSPECIFIED REACTION d  . Sulfa Antibiotics Rash    Prior to Admission medications   Medication Sig Start Date End Date Taking? Authorizing Provider  acetaminophen (TYLENOL) 500 MG tablet Take 500 mg by mouth every 12 (twelve) hours as needed for mild pain or moderate pain.     [provider]  B Complex  Vitamins (VITAMIN B COMPLEX PO) Take by mouth daily.    [provider]  calcium acetate (PHOSLO) 667 MG capsule Take 1 capsule (667 mg total) by mouth 3 (three) times daily with meals. Patient taking differently: Take 2,001 mg by mouth 3 (three) times daily with meals.  08/08/16   Ledell Noss, MD  cetirizine (ZYRTEC) 10 MG tablet Take 10 mg by mouth at bedtime.    [provider]  colchicine 0.6 MG tablet Take 0.5 tablets (0.3 mg total) by mouth daily as needed (for gout flareups). 09/24/16 12/23/16  Rhyne, Hulen Shouts, PA-C  febuxostat (ULORIC) 40 MG tablet Take 1 tablet (40 mg total) by mouth daily. 10/06/16 04/04/17  Bo Merino, MD  gabapentin (NEURONTIN) 100 MG capsule Take 300 mg by mouth at bedtime.     [provider]  hydrALAZINE (APRESOLINE) 25 MG tablet Take 50 mg by mouth daily as needed. Only take if diastolic gets above 299    [provider]  loperamide (IMODIUM) 2 MG capsule Take by mouth as needed for diarrhea or loose stools.    [provider]  oxyCODONE-acetaminophen (ROXICET) 5-325 MG tablet Take 1 tablet by mouth every 6 (six) hours as needed for severe pain. Patient not taking: Reported on 11/17/2016 09/24/16   Gabriel Earing, PA-C  ranitidine (ZANTAC) 150 MG  tablet Take 150 mg by mouth every morning.     [provider]  simvastatin (ZOCOR) 20 MG tablet Take 20 mg by mouth every morning.    [provider]    Social History   Socioeconomic History  . Marital status: Married    Spouse name: Not on file  . Number of children: Not on file  . Years of education: Not on file  . Highest education level: Not on file  Social Needs  . Financial resource strain: Not on file  . Food insecurity - worry: Not on file  . Food insecurity - inability: Not on file  . Transportation needs - medical: Not on file  . Transportation needs - non-medical: Not on file  Occupational History  . Not on file  Tobacco Use  .  Smoking status: Former Smoker    Packs/day: 1.00    Years: 10.00    Pack years: 10.00    Types: Cigarettes    Last attempt to quit: 08/13/1982    Years since quitting: 34.3  . Smokeless tobacco: Former Network engineer and Sexual Activity  . Alcohol use: Yes    Comment: rarely  . Drug use: No  . Sexual activity: Not on file  Other Topics Concern  . Not on file  Social History Narrative  . Not on file     Family History  Problem Relation Age of Onset  . Heart disease Mother   . Cancer Father     ROS: [x]  Positive   [ ]  Negative   [ ]  All sytems reviewed and are negative  Cardiovascular: []  chest pain/pressure []  palpitations []  SOB lying flat []  DOE []  pain in legs while walking []  pain in legs at rest []  pain in legs at night []  non-healing ulcers []  hx of DVT []  swelling in legs  Pulmonary: []  productive cough []  asthma/wheezing []  home O2  Neurologic: []  weakness in []  arms []  legs []  numbness in []  arms []  legs []  hx of CVA []  mini stroke [] difficulty speaking or slurred speech []  temporary loss of vision in one eye []  dizziness  Hematologic: []  hx of cancer []  bleeding problems []  problems with blood clotting easily  Endocrine:   []  diabetes []  thyroid disease  GI []  vomiting blood []  blood in stool  GU: []  CKD/renal failure []  HD--[]  M/W/F or []  T/T/S []  burning with urination []  blood in urine  Psychiatric: []  anxiety []  depression  Musculoskeletal: []  arthritis []  joint pain  Integumentary: []  rashes []  ulcers  Constitutional: []  fever []  chills   Physical Examination  Vitals:   12/24/16 1143 12/24/16 1154  BP: (!) 100/54 113/72  Pulse: 84 83  Resp: 16   Temp: 98.1 F (36.7 C)   SpO2: 97% 97%   Body mass index is 41.84 kg/m.  General:  WDWN in NAD HENT: WNL, normocephalic Pulmonary: normal non-labored breathing, without Rales, rhonchi,  wheezing Cardiac: no palpable left radial pulse Abdomen:  soft, NT/ND, no  masses Musculoskeletal: strong thrill left upper arm  Neurologic: A&O X 3; SENSATION: normal; MOTOR FUNCTION:  moving all extremities equally. Speech is fluent/normal Psychiatric:  Appropriate mood and affect  CBC    Component Value Date/Time   WBC 5.3 11/12/2016 1341   WBC 7.0 08/07/2016 0633   RBC 3.62 (L) 11/12/2016 1341   RBC 3.22 (L) 08/07/2016 0633   HGB 11.5 (L) 11/12/2016 1341   HCT 34.9 (L) 11/12/2016 1341   PLT 202 11/12/2016  1341   MCV 96 11/12/2016 1341   MCH 31.8 11/12/2016 1341   MCH 29.5 08/07/2016 0633   MCHC 33.0 11/12/2016 1341   MCHC 30.7 08/07/2016 0633   RDW 15.0 11/12/2016 1341   LYMPHSABS 2.1 11/12/2016 1341   MONOABS 0.4 07/18/2013 1248   EOSABS 0.2 11/12/2016 1341   BASOSABS 0.0 11/12/2016 1341    BMET    Component Value Date/Time   NA 145 (H) 11/12/2016 1341   K 4.7 11/12/2016 1341   CL 96 11/12/2016 1341   CO2 29 11/12/2016 1341   GLUCOSE 106 (H) 11/12/2016 1341   GLUCOSE 93 09/24/2016 0752   BUN 46 (H) 11/12/2016 1341   CREATININE 7.73 (H) 11/12/2016 1341   CALCIUM 9.3 11/12/2016 1341   GFRNONAA 7 (L) 11/12/2016 1341   GFRAA 8 (L) 11/12/2016 1341    COAGS: Lab Results  Component Value Date   INR 1.04 07/18/2013   INR 2.1 (H) 03/08/2008   INR 1.3 03/07/2008     Non-Invasive Vascular Imaging:   No studies  ASSESSMENT/PLAN: This is a 66 y.o. male with bleeding from his left upper arm AV fistula.  He is currently hemostatic does have a strong palpable thrill.  The concern is for central obstruction and we will proceed with fistulogram left upper extremity possible intervention.  I discussed risk benefits alternatives he agrees to proceed today.  Dayane Hillenburg C. Donzetta Matters, MD [facility] Office: [phone] Pager: [phone]

## 2016-12-25 ENCOUNTER — Encounter (HOSPITAL_COMMUNITY): Payer: Self-pay | Admitting: Vascular Surgery

## 2016-12-25 ENCOUNTER — Telehealth: Payer: Self-pay | Admitting: Vascular Surgery

## 2016-12-25 NOTE — Telephone Encounter (Signed)
Sched appt 01/15/17 at 11:00. Lm on cell#.

## 2016-12-25 NOTE — Telephone Encounter (Signed)
-----   Message from Mena Goes, RN sent at 12/24/2016  3:58 PM EST ----- Regarding: 2-3 weeks for suture removal   ----- Message ----- From: Waynetta Sandy, MD Sent: 12/24/2016   3:32 PM To: [facility] Charge [address]  Richard Bush [id number] 1950-12-26  12/24/2016 Pre-operative Diagnosis: esrd, bleeding left arm avf  Surgeon:  Eda Paschal. Donzetta Matters, MD  Procedure Performed: 1.  US guided cannulation of left arm avf 2.  Left arm fistulogram  F/u in 2-3 weeks for suture removal

## 2017-01-05 ENCOUNTER — Encounter (HOSPITAL_COMMUNITY): Payer: Self-pay | Admitting: *Deleted

## 2017-01-05 ENCOUNTER — Inpatient Hospital Stay (HOSPITAL_COMMUNITY)
Admission: EM | Admit: 2017-01-05 | Discharge: 2017-01-07 | DRG: 264 | Disposition: A | Discharge status: Home / Self Care | Payer: Medicare Other | Attending: Internal Medicine | Admitting: Internal Medicine

## 2017-01-05 ENCOUNTER — Encounter (HOSPITAL_COMMUNITY): Payer: Self-pay | Admitting: Emergency Medicine

## 2017-01-05 DIAGNOSIS — N186 End stage renal disease: Secondary | ICD-10-CM | POA: Diagnosis not present

## 2017-01-05 DIAGNOSIS — IMO0001 Reserved for inherently not codable concepts without codable children: Secondary | ICD-10-CM | POA: Diagnosis present

## 2017-01-05 DIAGNOSIS — E785 Hyperlipidemia, unspecified: Secondary | ICD-10-CM | POA: Diagnosis present

## 2017-01-05 DIAGNOSIS — D631 Anemia in chronic kidney disease: Secondary | ICD-10-CM | POA: Diagnosis present

## 2017-01-05 DIAGNOSIS — R42 Dizziness and giddiness: Secondary | ICD-10-CM

## 2017-01-05 DIAGNOSIS — N2581 Secondary hyperparathyroidism of renal origin: Secondary | ICD-10-CM

## 2017-01-05 DIAGNOSIS — Z96641 Presence of right artificial hip joint: Secondary | ICD-10-CM | POA: Diagnosis present

## 2017-01-05 DIAGNOSIS — M109 Gout, unspecified: Secondary | ICD-10-CM | POA: Diagnosis present

## 2017-01-05 DIAGNOSIS — Y832 Surgical operation with anastomosis, bypass or graft as the cause of abnormal reaction of the patient, or of later complication, without mention of misadventure at the time of the procedure: Secondary | ICD-10-CM | POA: Diagnosis present

## 2017-01-05 DIAGNOSIS — N189 Chronic kidney disease, unspecified: Secondary | ICD-10-CM

## 2017-01-05 DIAGNOSIS — T82838D Hemorrhage of vascular prosthetic devices, implants and grafts, subsequent encounter: Secondary | ICD-10-CM

## 2017-01-05 DIAGNOSIS — I120 Hypertensive chronic kidney disease with stage 5 chronic kidney disease or end stage renal disease: Secondary | ICD-10-CM | POA: Diagnosis present

## 2017-01-05 DIAGNOSIS — T82838A Hemorrhage of vascular prosthetic devices, implants and grafts, initial encounter: Secondary | ICD-10-CM | POA: Diagnosis present

## 2017-01-05 DIAGNOSIS — Z8249 Family history of ischemic heart disease and other diseases of the circulatory system: Secondary | ICD-10-CM

## 2017-01-05 DIAGNOSIS — K219 Gastro-esophageal reflux disease without esophagitis: Secondary | ICD-10-CM | POA: Diagnosis present

## 2017-01-05 DIAGNOSIS — Z992 Dependence on renal dialysis: Secondary | ICD-10-CM

## 2017-01-05 DIAGNOSIS — I4510 Unspecified right bundle-branch block: Secondary | ICD-10-CM | POA: Diagnosis present

## 2017-01-05 HISTORY — DX: Hemorrhage due to vascular prosthetic devices, implants and grafts, initial encounter: T82.838A

## 2017-01-05 HISTORY — DX: Reserved for inherently not codable concepts without codable children: IMO0001

## 2017-01-05 HISTORY — DX: Other chronic pain: G89.29

## 2017-01-05 HISTORY — DX: Unspecified osteoarthritis, unspecified site: M19.90

## 2017-01-05 HISTORY — DX: Pure hypercholesterolemia, unspecified: E78.00

## 2017-01-05 HISTORY — DX: Dependence on renal dialysis: Z99.2

## 2017-01-05 HISTORY — DX: End stage renal disease: N18.6

## 2017-01-05 HISTORY — DX: Pain in right hip: M25.551

## 2017-01-05 LAB — BASIC METABOLIC PANEL
Anion gap: 12 (ref 5–15)
BUN: 27 mg/dL — AB (ref 6–20)
CALCIUM: 9.1 mg/dL (ref 8.9–10.3)
CO2: 32 mmol/L (ref 22–32)
Chloride: 94 mmol/L — ABNORMAL LOW (ref 101–111)
Creatinine, Ser: 5.79 mg/dL — ABNORMAL HIGH (ref 0.61–1.24)
GFR calc Af Amer: 11 mL/min — ABNORMAL LOW (ref >60)
GFR, EST NON AFRICAN AMERICAN: 9 mL/min — AB (ref >60)
GLUCOSE: 101 mg/dL — AB (ref 65–99)
Potassium: 3.4 mmol/L — ABNORMAL LOW (ref 3.5–5.1)
Sodium: 138 mmol/L (ref 135–145)

## 2017-01-05 LAB — APTT: APTT: 30 s (ref 24–36)

## 2017-01-05 LAB — CBC WITH DIFFERENTIAL/PLATELET
BASOS ABS: 0 10*3/uL (ref 0.0–0.1)
BASOS PCT: 0 %
EOS PCT: 3 %
Eosinophils Absolute: 0.2 10*3/uL (ref 0.0–0.7)
HCT: 31.7 % — ABNORMAL LOW (ref 39.0–52.0)
Hemoglobin: 10.4 g/dL — ABNORMAL LOW (ref 13.0–17.0)
LYMPHS PCT: 32 %
Lymphs Abs: 2.4 10*3/uL (ref 0.7–4.0)
MCH: 33.3 pg (ref 26.0–34.0)
MCHC: 32.8 g/dL (ref 30.0–36.0)
MCV: 101.6 fL — AB (ref 78.0–100.0)
MONO ABS: 0.5 10*3/uL (ref 0.1–1.0)
Monocytes Relative: 7 %
Neutro Abs: 4.2 10*3/uL (ref 1.7–7.7)
Neutrophils Relative %: 58 %
PLATELETS: 228 10*3/uL (ref 150–400)
RBC: 3.12 MIL/uL — ABNORMAL LOW (ref 4.22–5.81)
RDW: 15.2 % (ref 11.5–15.5)
WBC: 7.3 10*3/uL (ref 4.0–10.5)

## 2017-01-05 LAB — TYPE AND SCREEN
ABO/RH(D): B POS
Antibody Screen: NEGATIVE

## 2017-01-05 LAB — PROTIME-INR
INR: 1.11
Prothrombin Time: 14.2 seconds (ref 11.4–15.2)

## 2017-01-05 NOTE — ED Triage Notes (Addendum)
Pt reports that his fistula began bleeding last night, got bleeding stopped and went to dialysis today-had full session. pts dialysis doctor sent him for further eval with vascular surgeons. Denies any bleeding at this time. Pt had episode of the same 2 weeks ago and was seen by vascular here.

## 2017-01-05 NOTE — ED Notes (Signed)
Pt reports his dialysis fistula started to bleed last night when he attempted to change the dressing.  States it took a while for it to stop bleeding after having dialysis the day before.  Pt denies any pain.  Pt is A/O x 4.  Family at bedside.

## 2017-01-05 NOTE — ED Provider Notes (Signed)
[facility] EMERGENCY DEPARTMENT Provider Note   CSN: [id number] Arrival date & time: 01/05/17  1703     History   Chief Complaint Chief Complaint  Patient presents with  . Vascular Access Problem    HPI Richard Bush is a 66 y.o. male.   The history is provided by the patient.  He has end-stage renal disease and is on dialysis every Monday-Wednesday-Friday.  He was dialyzed today (schedule was altered because of Thanksgiving holiday) and advised to come to the emergency department for his vascular surgeon to evaluate his fistula.  He had been in the emergency department 2 weeks ago at which time there was bleeding from the fistula which required an ED visit.  The area was sutured and he had been doing generally well.  Last night, he had recurrent bleeding from the site.  He did not have any bleeding at dialysis, but was advised to have his surgeon reevaluated.  He is not complaining of pain in the arm.  Past Medical History:  Diagnosis Date  . Anemia   . Arthritis   . Chronic kidney disease    dialysis M/W/F- [facility]  . GERD (gastroesophageal reflux disease)   . Gout   . Hypertension   . Neuropathy   . Pleurisy   . Pneumonia     Patient Active Problem List   Diagnosis Date Noted  . Pressure injury of skin 08/04/2016  . 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 Anemia of renal disease 08/04/2016  . Pulmonary nodule 08/03/2016  . ESRD (end stage renal disease) ([place]) 08/03/2016  . Right bundle branch block 08/03/2016  . History of gastroesophageal reflux (GERD) 07/13/2016  . Idiopathic chronic gout without tophus 07/03/2016  . Primary osteoarthritis of left knee 07/03/2016  . Stage 4 chronic kidney disease ([place]) 07/03/2016  . Osteoarthritis of hand 07/03/2016  . Hypertension 07/03/2016  . Hyperparathyroidism ([place]) 07/03/2016  . Primary osteoarthritis of both feet 07/03/2016  . History of total hip replacement, right 07/03/2016    . Hx of total knee replacement, bilateral 07/03/2016    Past Surgical History:  Procedure Laterality Date  . A/V FISTULAGRAM Left 12/24/2016   Procedure: A/V FISTULAGRAM;  Surgeon: Waynetta Sandy, MD;  Location: [facility];  Service: Cardiovascular;  Laterality: Left;  . APPENDECTOMY  1963  . AV FISTULA PLACEMENT Left 08/04/2016   Procedure: ARTERIOVENOUS (AV) FISTULA CREATION/LEFT ARM;  Surgeon: Serafina Mitchell, MD;  Location: [facility];  Service: Vascular;  Laterality: Left;  . CARPAL TUNNEL RELEASE Bilateral 2006 right and 2007 left   . CHOLECYSTECTOMY  1979  . COLONOSCOPY    . FISTULA SUPERFICIALIZATION Left 09/24/2016   Procedure: FISTULA SUPERFICIALIZATION- LEFT ARM;  Surgeon: Serafina Mitchell, MD;  Location: [place];  Service: Vascular;  Laterality: Left;  . FOOT SURGERY Left 1981  . INSERTION OF DIALYSIS CATHETER Left 08/04/2016   Procedure: INSERTION OF DIALYSIS CATHETER;  Surgeon: Serafina Mitchell, MD;  Location: [place];  Service: Vascular;  Laterality: Left;  . JOINT REPLACEMENT Right 2008   . JOINT REPLACEMENT Left 2010  . LUMBAR LAMINECTOMY/DECOMPRESSION MICRODISCECTOMY N/A 08/18/2012   Procedure: LUMBAR LAMINECTOMY/DECOMPRESSION MICRODISCECTOMY 3 LEVELS;  Surgeon: Hosie Spangle, MD;  Location: [place] [facility];  Service: Neurosurgery;  Laterality: N/A;  Lumbar Two through Five Laminectomy  . SHOULDER ARTHROSCOPY WITH ROTATOR CUFF REPAIR Right 2001  . TOTAL HIP ARTHROPLASTY Right 07/26/2013   Procedure: TOTAL HIP ARTHROPLASTY;  Surgeon: Kerin Salen, MD;  Location: [place];  Service: Orthopedics;  Laterality: Right;  . WRIST TENDON TRANSFER Bilateral 1972 left and 1973 right       Home Medications    Prior to Admission medications   Medication Sig Start Date End Date Taking? Authorizing Provider  ranitidine (ZANTAC) 150 MG tablet Take 150 mg by mouth every morning.    Yes [provider]  B Complex Vitamins (VITAMIN B COMPLEX PO) Take by mouth daily.     [provider]  calcium acetate (PHOSLO) 667 MG capsule Take 1 capsule (667 mg total) by mouth 3 (three) times daily with meals. Patient taking differently: Take 2,001 mg by mouth 3 (three) times daily with meals.  08/08/16   Ledell Noss, MD  cetirizine (ZYRTEC) 10 MG tablet Take 10 mg by mouth at bedtime.    [provider]  colchicine 0.6 MG tablet Take 0.5 tablets (0.3 mg total) by mouth daily as needed (for gout flareups). 09/24/16 12/23/16  Rhyne, Hulen Shouts, PA-C  febuxostat (ULORIC) 40 MG tablet Take 1 tablet (40 mg total) by mouth daily. 10/06/16 04/04/17  Bo Merino, MD  gabapentin (NEURONTIN) 100 MG capsule Take 300 mg by mouth at bedtime.     [provider]  hydrALAZINE (APRESOLINE) 25 MG tablet Take 50 mg by mouth daily as needed. Only take if diastolic gets above 427    [provider]  loperamide (IMODIUM) 2 MG capsule Take by mouth as needed for diarrhea or loose stools.    [provider]  multivitamin (RENA-VIT) TABS tablet Take 1 tablet daily by mouth.    [provider]  oxyCODONE-acetaminophen (ROXICET) 5-325 MG tablet Take 1 tablet by mouth every 6 (six) hours as needed for severe pain. Patient not taking: Reported on 11/17/2016 09/24/16   Gabriel Earing, PA-C  simvastatin (ZOCOR) 20 MG tablet Take 20 mg by mouth every morning.    [provider]    Family History Family History  Problem Relation Age of Onset  . Heart disease Mother   . Cancer Father     Social History Social History   Tobacco Use  . Smoking status: Former Smoker    Packs/day: 1.00    Years: 10.00    Pack years: 10.00    Types: Cigarettes    Last attempt to quit: 08/13/1982    Years since quitting: 34.4  . Smokeless tobacco: Former Network engineer Use Topics  . Alcohol use: Yes    Comment: rarely  . Drug use: No     Allergies   Cozaar [losartan potassium]; Kayexalate [polystyrene]; Altace [ramipril]; Other; Cholestyramine  light; and Sulfa antibiotics   Review of Systems Review of Systems  All other systems reviewed and are negative.    Physical Exam Updated Vital Signs BP (!) 100/48 (BP Location: Right Arm)   Pulse 88   Temp 98.2 F (36.8 C) (Oral)   Resp 16   SpO2 96%   Physical Exam  Nursing note and vitals reviewed.  66 year old male, resting comfortably and in no acute distress. Vital signs are normal. Oxygen saturation is 96%, which is normal. Head is normocephalic and atraumatic. PERRLA, EOMI. Oropharynx is clear. Neck is nontender and supple without adenopathy or JVD. Back is nontender and there is no CVA tenderness. Lungs are clear without rales, wheezes, or rhonchi. Chest is nontender. Heart has regular rate and rhythm without murmur. Abdomen is soft, flat, nontender without masses or hepatosplenomegaly and peristalsis is normoactive. Extremities have  2+ edema, full range of motion is present.  AV fistula is present in the left upper arm with thrill present.  Suture line is intact, but there does appear to have been recent bleeding.  No evidence of infection. Distal neurovascular exam is intact. Skin is warm and dry without rash. Neurologic: Mental status is normal, cranial nerves are intact, there are no motor or sensory deficits.  ED Treatments / Results  Labs (all labs ordered are listed, but only abnormal results are displayed) Labs Reviewed  BASIC METABOLIC PANEL - Abnormal; Notable for the following components:      Result Value   Potassium 3.4 (*)    Chloride 94 (*)    Glucose, Bld 101 (*)    BUN 27 (*)    Creatinine, Ser 5.79 (*)    GFR calc non Af Amer 9 (*)    GFR calc Af Amer 11 (*)    All other components within normal limits  CBC WITH DIFFERENTIAL/PLATELET - Abnormal; Notable for the following components:   RBC 3.12 (*)    Hemoglobin 10.4 (*)    HCT 31.7 (*)    MCV 101.6 (*)    All other components within normal limits  PROTIME-INR  APTT  TYPE AND SCREEN      Procedures Procedures (including critical care time)  Medications Ordered in ED Medications - No data to display   Initial Impression / Assessment and Plan / ED Course  I have reviewed the triage vital signs and the nursing notes.  Pertinent labs & imaging results that were available during my care of the patient were reviewed by me and considered in my medical decision making (see chart for details).  Recent bleeding from AV fistula, no active bleeding currently.  Old records are reviewed showing fistulogram done on November 8 which was normal.  He is scheduled for revision of AV fistula tomorrow.  Case is discussed with Dr. Trula Slade of vascular surgery service.  He had discussed the situation with the patient's nephrologist and his nephrologist was very concerned about the amount of bleeding he had had last night.  Decision is made to admit the patient for observation and to proceed with revision of AV fistula tomorrow.  Case is discussed with Dr. Hal Hope, of [facility], who agrees to admit the patient.  Final Clinical Impressions(s) / ED Diagnoses   Final diagnoses:  Bleeding from dialysis shunt, subsequent encounter  ESRD on dialysis [facility])    ED Discharge Orders    None      Delora Fuel, MD 47/42/59 [phone]

## 2017-01-05 NOTE — ED Notes (Signed)
IV team at bedside 

## 2017-01-05 NOTE — ED Notes (Signed)
Attempted to start IV access x 2 without success.  Nikki RN also attempted without success.  Will place IV team consult

## 2017-01-05 NOTE — Progress Notes (Addendum)
I called and left a message on Mr [facility] voice mail.  Mrs Delone called me back and reported that patient was sent to the ED by Dr Florene Glen, patient's nephrologist because patient has had bleeding from fistula.  Mrs Pelc does not know what the plan is, except that the patient is to be evaluated by the ED Dr. I instructed Mrs Ramthun that is patient is discharged from ED, that patient is to be NPO after midnight and I instructed her to call pre op desk in am for medication instructions (patient and his wife are in the waiting area of the the ED).  I informed Ms Herda that patient is currently scheduled to arrive at 1100 tomorrow.

## 2017-01-06 ENCOUNTER — Encounter (HOSPITAL_COMMUNITY): Payer: Self-pay | Admitting: General Practice

## 2017-01-06 ENCOUNTER — Encounter (HOSPITAL_COMMUNITY)
Admission: EM | Disposition: A | Discharge status: Home / Self Care | Payer: Self-pay | Source: Home / Self Care | Attending: Internal Medicine

## 2017-01-06 ENCOUNTER — Observation Stay (HOSPITAL_COMMUNITY): Payer: Medicare Other | Admitting: Anesthesiology

## 2017-01-06 ENCOUNTER — Ambulatory Visit: Admit: 2017-01-06 | Payer: Medicare Other | Admitting: Surgery

## 2017-01-06 ENCOUNTER — Other Ambulatory Visit: Payer: Self-pay

## 2017-01-06 DIAGNOSIS — N186 End stage renal disease: Secondary | ICD-10-CM

## 2017-01-06 DIAGNOSIS — I4510 Unspecified right bundle-branch block: Secondary | ICD-10-CM | POA: Diagnosis present

## 2017-01-06 DIAGNOSIS — Z992 Dependence on renal dialysis: Secondary | ICD-10-CM | POA: Diagnosis not present

## 2017-01-06 DIAGNOSIS — Z96641 Presence of right artificial hip joint: Secondary | ICD-10-CM | POA: Diagnosis present

## 2017-01-06 DIAGNOSIS — T82838A Hemorrhage of vascular prosthetic devices, implants and grafts, initial encounter: Secondary | ICD-10-CM | POA: Diagnosis present

## 2017-01-06 DIAGNOSIS — D631 Anemia in chronic kidney disease: Secondary | ICD-10-CM | POA: Diagnosis present

## 2017-01-06 DIAGNOSIS — Z8249 Family history of ischemic heart disease and other diseases of the circulatory system: Secondary | ICD-10-CM | POA: Diagnosis not present

## 2017-01-06 DIAGNOSIS — K219 Gastro-esophageal reflux disease without esophagitis: Secondary | ICD-10-CM | POA: Diagnosis present

## 2017-01-06 DIAGNOSIS — E785 Hyperlipidemia, unspecified: Secondary | ICD-10-CM | POA: Diagnosis present

## 2017-01-06 DIAGNOSIS — M109 Gout, unspecified: Secondary | ICD-10-CM | POA: Diagnosis present

## 2017-01-06 DIAGNOSIS — N2581 Secondary hyperparathyroidism of renal origin: Secondary | ICD-10-CM | POA: Diagnosis present

## 2017-01-06 DIAGNOSIS — I120 Hypertensive chronic kidney disease with stage 5 chronic kidney disease or end stage renal disease: Secondary | ICD-10-CM | POA: Diagnosis present

## 2017-01-06 DIAGNOSIS — R42 Dizziness and giddiness: Secondary | ICD-10-CM | POA: Diagnosis present

## 2017-01-06 DIAGNOSIS — Y832 Surgical operation with anastomosis, bypass or graft as the cause of abnormal reaction of the patient, or of later complication, without mention of misadventure at the time of the procedure: Secondary | ICD-10-CM | POA: Diagnosis present

## 2017-01-06 HISTORY — PX: REVISON OF ARTERIOVENOUS FISTULA: SHX6074

## 2017-01-06 HISTORY — PX: AV FISTULA REPAIR: SHX563

## 2017-01-06 LAB — SURGICAL PCR SCREEN
MRSA, PCR: NEGATIVE
Staphylococcus aureus: NEGATIVE

## 2017-01-06 SURGERY — REVISON OF ARTERIOVENOUS FISTULA
Anesthesia: Monitor Anesthesia Care | Site: Arm Upper | Laterality: Left

## 2017-01-06 MED ORDER — ONDANSETRON HCL 4 MG/2ML IJ SOLN: 4.0000 mg | Freq: Once | INTRAMUSCULAR | Status: DC | PRN

## 2017-01-06 MED ORDER — PROPOFOL 10 MG/ML IV BOLUS
INTRAVENOUS | Status: AC
  Filled 2017-01-06: qty 20

## 2017-01-06 MED ORDER — FENTANYL CITRATE (PF) 100 MCG/2ML IJ SOLN: 25.0000 ug | INTRAMUSCULAR | Status: DC | PRN

## 2017-01-06 MED ORDER — FERRIC CITRATE 1 GM 210 MG(FE) PO TABS
210.0000 mg | ORAL_TABLET | Freq: Three times a day (TID) | ORAL | Status: DC
  Filled 2017-01-06: qty 1

## 2017-01-06 MED ORDER — OXYCODONE-ACETAMINOPHEN 5-325 MG PO TABS: 1.0000 | ORAL_TABLET | Freq: Four times a day (QID) | ORAL | Status: DC | PRN

## 2017-01-06 MED ORDER — ACETAMINOPHEN 325 MG PO TABS
650.0000 mg | ORAL_TABLET | Freq: Four times a day (QID) | ORAL | Status: DC | PRN
  Administered 2017-01-06: 650 mg via ORAL
  Filled 2017-01-06: qty 2

## 2017-01-06 MED ORDER — LIDOCAINE-EPINEPHRINE (PF) 1 %-1:200000 IJ SOLN
INTRAMUSCULAR | Status: AC
  Filled 2017-01-06: qty 30

## 2017-01-06 MED ORDER — CALCIUM ACETATE (PHOS BINDER) 667 MG PO CAPS: 667.0000 mg | ORAL_CAPSULE | Freq: Three times a day (TID) | ORAL | Status: DC

## 2017-01-06 MED ORDER — MIDAZOLAM HCL 2 MG/2ML IJ SOLN
INTRAMUSCULAR | Status: AC
  Filled 2017-01-06: qty 2

## 2017-01-06 MED ORDER — FERRIC CITRATE 1 GM 210 MG(FE) PO TABS
210.0000 mg | ORAL_TABLET | Freq: Three times a day (TID) | ORAL | Status: DC
  Administered 2017-01-06 – 2017-01-07 (×2): 210 mg via ORAL
  Filled 2017-01-06 (×4): qty 1

## 2017-01-06 MED ORDER — PHENYLEPHRINE HCL 10 MG/ML IJ SOLN
INTRAVENOUS | Status: DC | PRN
  Administered 2017-01-06: 75 ug/min via INTRAVENOUS

## 2017-01-06 MED ORDER — FEBUXOSTAT 40 MG PO TABS
40.0000 mg | ORAL_TABLET | Freq: Every day | ORAL | Status: DC
  Administered 2017-01-07: 40 mg via ORAL
  Filled 2017-01-06: qty 1

## 2017-01-06 MED ORDER — CALCIUM ACETATE (PHOS BINDER) 667 MG PO CAPS
667.0000 mg | ORAL_CAPSULE | Freq: Three times a day (TID) | ORAL | Status: DC
  Administered 2017-01-06 – 2017-01-07 (×2): 667 mg via ORAL
  Filled 2017-01-06 (×3): qty 1

## 2017-01-06 MED ORDER — PROPOFOL 500 MG/50ML IV EMUL
INTRAVENOUS | Status: DC | PRN
  Administered 2017-01-06: 100 ug/kg/min via INTRAVENOUS

## 2017-01-06 MED ORDER — FENTANYL CITRATE (PF) 250 MCG/5ML IJ SOLN
INTRAMUSCULAR | Status: AC
  Filled 2017-01-06: qty 5

## 2017-01-06 MED ORDER — FAMOTIDINE 20 MG PO TABS
20.0000 mg | ORAL_TABLET | Freq: Every day | ORAL | Status: DC
  Administered 2017-01-07: 20 mg via ORAL
  Filled 2017-01-06: qty 1

## 2017-01-06 MED ORDER — SIMVASTATIN 20 MG PO TABS
20.0000 mg | ORAL_TABLET | Freq: Every morning | ORAL | Status: DC
  Administered 2017-01-07: 20 mg via ORAL
  Filled 2017-01-06: qty 1

## 2017-01-06 MED ORDER — 0.9 % SODIUM CHLORIDE (POUR BTL) OPTIME
TOPICAL | Status: DC | PRN
  Administered 2017-01-06: 1000 mL

## 2017-01-06 MED ORDER — SODIUM CHLORIDE 0.9 % IV SOLN
INTRAVENOUS | Status: DC
  Administered 2017-01-06: 10 mL/h via INTRAVENOUS

## 2017-01-06 MED ORDER — LIDOCAINE-EPINEPHRINE (PF) 1 %-1:200000 IJ SOLN
INTRAMUSCULAR | Status: DC | PRN
  Administered 2017-01-06: 30 mL

## 2017-01-06 MED ORDER — FENTANYL CITRATE (PF) 100 MCG/2ML IJ SOLN
INTRAMUSCULAR | Status: DC | PRN
  Administered 2017-01-06: 50 ug via INTRAVENOUS

## 2017-01-06 MED ORDER — CEFAZOLIN SODIUM-DEXTROSE 2-3 GM-%(50ML) IV SOLR
INTRAVENOUS | Status: DC | PRN
  Administered 2017-01-06: 2 g via INTRAVENOUS

## 2017-01-06 MED ORDER — COLCHICINE 0.6 MG PO TABS: 0.3000 mg | ORAL_TABLET | Freq: Every day | ORAL | Status: DC | PRN

## 2017-01-06 MED ORDER — GABAPENTIN 300 MG PO CAPS
300.0000 mg | ORAL_CAPSULE | Freq: Every day | ORAL | Status: DC
Start: 2017-01-06 — End: 2017-01-07
  Administered 2017-01-06: 300 mg via ORAL
  Filled 2017-01-06: qty 1

## 2017-01-06 SURGICAL SUPPLY — 36 items
ADH SKN CLS APL DERMABOND .7 (GAUZE/BANDAGES/DRESSINGS) ×1
ARMBAND PINK RESTRICT EXTREMIT (MISCELLANEOUS) ×2 IMPLANT
CANISTER SUCT 3000ML PPV (MISCELLANEOUS) ×2 IMPLANT
CLIP VESOCCLUDE MED 6/CT (CLIP) ×2 IMPLANT
CLIP VESOCCLUDE SM WIDE 6/CT (CLIP) ×2 IMPLANT
COVER PROBE W GEL 5X96 (DRAPES) IMPLANT
DERMABOND ADVANCED (GAUZE/BANDAGES/DRESSINGS) ×1
DERMABOND ADVANCED .7 DNX12 (GAUZE/BANDAGES/DRESSINGS) ×1 IMPLANT
DRSG TEGADERM 2-3/8X2-3/4 SM (GAUZE/BANDAGES/DRESSINGS) ×1 IMPLANT
ELECT CAUTERY BLADE 6.4 (BLADE) ×1 IMPLANT
ELECT REM PT RETURN 9FT ADLT (ELECTROSURGICAL) ×2
ELECTRODE REM PT RTRN 9FT ADLT (ELECTROSURGICAL) ×1 IMPLANT
GAUZE SPONGE 4X4 12PLY STRL LF (GAUZE/BANDAGES/DRESSINGS) ×1 IMPLANT
GLOVE BIOGEL PI IND STRL 6.5 (GLOVE) IMPLANT
GLOVE BIOGEL PI IND STRL 7.5 (GLOVE) ×1 IMPLANT
GLOVE BIOGEL PI INDICATOR 6.5 (GLOVE) ×1
GLOVE BIOGEL PI INDICATOR 7.5 (GLOVE) ×1
GLOVE SURG SS PI 7.5 STRL IVOR (GLOVE) ×2 IMPLANT
GOWN STRL REUS W/ TWL LRG LVL3 (GOWN DISPOSABLE) ×2 IMPLANT
GOWN STRL REUS W/ TWL XL LVL3 (GOWN DISPOSABLE) ×1 IMPLANT
GOWN STRL REUS W/TWL LRG LVL3 (GOWN DISPOSABLE) ×4
GOWN STRL REUS W/TWL XL LVL3 (GOWN DISPOSABLE) ×2
HEMOSTAT SNOW SURGICEL 2X4 (HEMOSTASIS) IMPLANT
KIT BASIN OR (CUSTOM PROCEDURE TRAY) ×2 IMPLANT
KIT ROOM TURNOVER OR (KITS) ×2 IMPLANT
NS IRRIG 1000ML POUR BTL (IV SOLUTION) ×2 IMPLANT
PACK CV ACCESS (CUSTOM PROCEDURE TRAY) ×2 IMPLANT
PAD ARMBOARD 7.5X6 YLW CONV (MISCELLANEOUS) ×4 IMPLANT
SUT ETHILON 3 0 PS 1 (SUTURE) ×1 IMPLANT
SUT PROLENE 5 0 C 1 24 (SUTURE) ×1 IMPLANT
SUT PROLENE 6 0 CC (SUTURE) ×3 IMPLANT
SUT VIC AB 3-0 SH 27 (SUTURE) ×2
SUT VIC AB 3-0 SH 27X BRD (SUTURE) ×1 IMPLANT
SUT VICRYL 4-0 PS2 18IN ABS (SUTURE) IMPLANT
UNDERPAD 30X30 (UNDERPADS AND DIAPERS) ×2 IMPLANT
WATER STERILE IRR 1000ML POUR (IV SOLUTION) ×2 IMPLANT

## 2017-01-06 NOTE — H&P (Signed)
History and Physical    Richard Bush UVO:[id number] DOB: September 07, 1950 DOA: 01/05/2017  PCP: Quentin Cornwall, MD  Patient coming from: Home.  Chief Complaint: Vascular access problem for dialysis.  HPI: Richard Bush is a 66 y.o. male with history of ESRD on hemodialysis on Monday Wednesday Friday on alternate daily for dialysis due to holiday schedule had experienced bleeding from the left AV fistula 2 weeks ago which was sutured.  She had another episode of bleeding 2 nights ago which was self contained.  Patient had dialysis yesterday following which patient nephrology evaluated the fistula and felt that needs to be ablated by vascular surgeon and was referred to the ER.  In the ER  ED Course: There is no active bleeding seen on AV fistula.  Dr. Trula Slade on-call vascular surgeon was consulted by the ER physician and Dr. Trula Slade advised patient to be admitted and will be probably taken to the OR in the morning.  Patient otherwise denies any chest pain or shortness of breath.  Review of Systems: As per HPI, rest all negative.   Past Medical History:  Diagnosis Date  . Anemia   . Arthritis   . Chronic kidney disease    dialysis M/W/F- [facility]  . GERD (gastroesophageal reflux disease)   . Gout   . Hypertension   . Neuropathy   . Pleurisy   . Pneumonia     Past Surgical History:  Procedure Laterality Date  . A/V FISTULAGRAM Left 12/24/2016   Procedure: A/V FISTULAGRAM;  Surgeon: Waynetta Sandy, MD;  Location: [place] [facility];  Service: Cardiovascular;  Laterality: Left;  . APPENDECTOMY  1963  . AV FISTULA PLACEMENT Left 08/04/2016   Procedure: ARTERIOVENOUS (AV) FISTULA CREATION/LEFT ARM;  Surgeon: Serafina Mitchell, MD;  Location: [place];  Service: Vascular;  Laterality: Left;  . CARPAL TUNNEL RELEASE Bilateral 2006 right and 2007 left   . CHOLECYSTECTOMY  1979  . COLONOSCOPY    . FISTULA SUPERFICIALIZATION Left 09/24/2016   Procedure: FISTULA SUPERFICIALIZATION-  LEFT ARM;  Surgeon: Serafina Mitchell, MD;  Location: [place];  Service: Vascular;  Laterality: Left;  . FOOT SURGERY Left 1981  . INSERTION OF DIALYSIS CATHETER Left 08/04/2016   Procedure: INSERTION OF DIALYSIS CATHETER;  Surgeon: Serafina Mitchell, MD;  Location: [place];  Service: Vascular;  Laterality: Left;  . JOINT REPLACEMENT Right 2008   . JOINT REPLACEMENT Left 2010  . LUMBAR LAMINECTOMY/DECOMPRESSION MICRODISCECTOMY N/A 08/18/2012   Procedure: LUMBAR LAMINECTOMY/DECOMPRESSION MICRODISCECTOMY 3 LEVELS;  Surgeon: Hosie Spangle, MD;  Location: [place] [facility];  Service: Neurosurgery;  Laterality: N/A;  Lumbar Two through Five Laminectomy  . SHOULDER ARTHROSCOPY WITH ROTATOR CUFF REPAIR Right 2001  . TOTAL HIP ARTHROPLASTY Right 07/26/2013   Procedure: TOTAL HIP ARTHROPLASTY;  Surgeon: Kerin Salen, MD;  Location: [place];  Service: Orthopedics;  Laterality: Right;  . WRIST TENDON TRANSFER Bilateral 1972 left and 1973 right     reports that he quit smoking about 34 years ago. His smoking use included cigarettes. He has a 10.00 pack-year smoking history. He has quit using smokeless tobacco. He reports that he drinks alcohol. He reports that he does not use drugs.  Allergies  Allergen Reactions  . Cozaar [Losartan Potassium] Other (See Comments)    Stopped due to kidney decline 4/18  . Kayexalate [Polystyrene] Other (See Comments)    Abnormal ekg  . Altace [Ramipril] Cough    Chronic cough  . Other Swelling    "  Symvisc"   . Cholestyramine Light Other (See Comments)    UNSPECIFIED REACTION d  . Sulfa Antibiotics Rash    Family History  Problem Relation Age of Onset  . Heart disease Mother   . Cancer Father     Prior to Admission medications   Medication Sig Start Date End Date Taking? Authorizing Provider  Acetaminophen (TYLENOL ARTHRITIS PAIN PO) Take 650 mg by mouth daily as needed.   Yes [provider]  calcium acetate (PHOSLO) 667 MG capsule Take 1 capsule (667 mg  total) by mouth 3 (three) times daily with meals. 08/08/16  Yes Ledell Noss, MD  cetirizine (ZYRTEC) 10 MG tablet Take 10 mg by mouth at bedtime.   Yes [provider]  febuxostat (ULORIC) 40 MG tablet Take 1 tablet (40 mg total) by mouth daily. 10/06/16 04/04/17 Yes Deveshwar, Abel Presto, MD  ferric citrate (AURYXIA) 1 GM 210 MG(Fe) tablet Take 210 mg by mouth 3 (three) times daily with meals.    Yes [provider]  folic acid-vitamin b complex-vitamin c-selenium-zinc (DIALYVITE) 3 MG TABS tablet Take 1 tablet by mouth daily.   Yes [provider]  gabapentin (NEURONTIN) 300 MG capsule Take 300 mg by mouth at bedtime.    Yes [provider]  ibuprofen (ADVIL,MOTRIN) 200 MG tablet Take 400 mg by mouth every 6 (six) hours as needed.   Yes [provider]  loperamide (IMODIUM) 2 MG capsule Take by mouth as needed for diarrhea or loose stools.   Yes [provider]  ranitidine (ZANTAC) 150 MG tablet Take 150 mg by mouth every morning.    Yes [provider]  simvastatin (ZOCOR) 20 MG tablet Take 20 mg by mouth every morning.   Yes [provider]  colchicine 0.6 MG tablet Take 0.5 tablets (0.3 mg total) by mouth daily as needed (for gout flareups). 09/24/16 12/23/16  Rhyne, Hulen Shouts, PA-C  oxyCODONE-acetaminophen (ROXICET) 5-325 MG tablet Take 1 tablet by mouth every 6 (six) hours as needed for severe pain. Patient not taking: Reported on 01/05/2017 09/24/16   Gabriel Earing, [place]    Physical Exam: Vitals:   01/06/17 0044 01/06/17 0100 01/06/17 0108 01/06/17 0340  BP: 103/82 (!) 104/49 (!) 105/43 (!) 97/48  Pulse: 79  81 78  Resp: 13  20 16   Temp:   98.1 F (36.7 C) 98 F (36.7 C)  TempSrc:   Oral Oral  SpO2: 98%  98% 98%      Constitutional: Moderately built and nourished. Vitals:   01/06/17 0044 01/06/17 0100 01/06/17 0108 01/06/17 0340  BP: 103/82 (!) 104/49 (!) 105/43 (!) 97/48  Pulse: 79  81 78  Resp: 13  20 16     Temp:   98.1 F (36.7 C) 98 F (36.7 C)  TempSrc:   Oral Oral  SpO2: 98%  98% 98%   Eyes: Anicteric no pallor. ENMT: No discharge from the ears eyes nose or mouth. Neck: No mass felt.  No neck rigidity. Respiratory: No rhonchi or crepitations. Cardiovascular: S1-S2 heard no murmurs appreciated. Abdomen: Soft nontender bowel sounds present. Musculoskeletal: Left AV fistula no active bleeding.  Sutures seen. Skin: Left AV fistula no bleeding seen.  Sutures seen. Neurologic: Alert awake oriented to time place and person.  Moves all extremities. Psychiatric: Appears normal.   Labs on Admission: I have personally reviewed following labs and imaging studies  CBC: Recent Labs  Lab 01/05/17 2255  WBC 7.3  NEUTROABS 4.2  HGB 10.4*  HCT  31.7*  MCV 101.6*  PLT 779   Basic Metabolic Panel: Recent Labs  Lab 01/05/17 2255  NA 138  K 3.4*  CL 94*  CO2 32  GLUCOSE 101*  BUN 27*  CREATININE 5.79*  CALCIUM 9.1   GFR: Estimated Creatinine Clearance: 17.4 mL/min (A) (by C-G formula based on SCr of 5.79 mg/dL (H)). Liver Function Tests: No results for input(s): AST, ALT, ALKPHOS, BILITOT, PROT, ALBUMIN in the last 168 hours. No results for input(s): LIPASE, AMYLASE in the last 168 hours. No results for input(s): AMMONIA in the last 168 hours. Coagulation Profile: Recent Labs  Lab 01/05/17 2255  INR 1.11   Cardiac Enzymes: No results for input(s): CKTOTAL, CKMB, CKMBINDEX, TROPONINI in the last 168 hours. BNP (last 3 results) No results for input(s): PROBNP in the last 8760 hours. HbA1C: No results for input(s): HGBA1C in the last 72 hours. CBG: No results for input(s): GLUCAP in the last 168 hours. Lipid Profile: No results for input(s): CHOL, HDL, LDLCALC, TRIG, CHOLHDL, LDLDIRECT in the last 72 hours. Thyroid Function Tests: No results for input(s): TSH, T4TOTAL, FREET4, T3FREE, THYROIDAB in the last 72 hours. Anemia Panel: No results for input(s): VITAMINB12,  FOLATE, FERRITIN, TIBC, IRON, RETICCTPCT in the last 72 hours. Urine analysis:    Component Value Date/Time   COLORURINE STRAW (A) 08/03/2016 1007   APPEARANCEUR CLEAR 08/03/2016 1007   LABSPEC 1.009 08/03/2016 1007   PHURINE 5.0 08/03/2016 1007   GLUCOSEU NEGATIVE 08/03/2016 1007   HGBUR SMALL (A) 08/03/2016 1007   BILIRUBINUR NEGATIVE 08/03/2016 1007   KETONESUR NEGATIVE 08/03/2016 1007   PROTEINUR 100 (A) 08/03/2016 1007   UROBILINOGEN 0.2 07/18/2013 1248   NITRITE NEGATIVE 08/03/2016 1007   LEUKOCYTESUR NEGATIVE 08/03/2016 1007   Sepsis Labs: @LABRCNTIP (procalcitonin:4,lacticidven:4) )No results found for this or any previous visit (from the past 240 hour(s)).   Radiological Exams on Admission: No results found.   Assessment/Plan Active Problems:   ESRD on dialysis [facility])   Bleeding pseudoaneurysm of left brachiocephalic AV fistula, initial encounter ([place])    1. Bleeding left AV fistula -presently bleeding has been contained.  Patient will be kept n.p.o. in anticipation of procedure.  Dr. Trula Slade vascular surgeon has been consulted. 2. ESRD on hemodialysis -as had dialysis yesterday.  Please consult nephrology for further recommendations. 3. 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 4. Hyperlipidemia on statin. 5. History of gout.   DVT prophylaxis: SCDs. Code Status: Full code. Family Communication: Patient's wife at the bedside. Disposition Plan: Home. Consults called: Vascular surgeon. Admission status: Observation.   Rise Patience MD [facility] Pager [phone].  If 7PM-7AM, please contact night-coverage [email or web address] Password [facility]  01/06/2017, 7:19 AM

## 2017-01-06 NOTE — Op Note (Signed)
    Patient name: Richard Bush MRN: [id number] DOB: 28-Oct-1950 Sex: male  01/06/2017 Pre-operative Diagnosis: Bleeding left upper arm AV fistula Post-operative diagnosis:  Same Surgeon:  [name] Assistants:  Linus Orn Procedure:   Resection of necrotic ulcer over top of the left upper arm fistula with suture ligation of the fistulotomy. Anesthesia:  MAC Blood Loss: Minimal Specimens: None   Indications: The patient has had multiple episodes of bleeding from his left upper arm fistula.  This was sutured closed several weeks ago but he has had recurrent bleeding.  He had a fistula study which showed no stenosis.  He is here today for repair.  Procedure:  The patient was identified in the holding area and taken to [place] [facility]  The patient was then placed supine on the table. general anesthesia was administered.  The patient was prepped and draped in the usual sterile fashion.  A time out was called and antibiotics were administered.  An ellipse type incision was made around the ulcerated portion of the fistula.  I used cautery to divide subcutaneous tissue and dissected out a paddle of skin over top of the fistula.  The skin and subcutaneous tissue was removed, exposing the opening in the fistula.  Control of the fistula was obtained with manual pressure.  I then used a 5-0 Prolene suture to close the opening within the fistula.  I then reapproximated the subcutaneous tissue with 3-0 Vicryl.  The skin was then closed with running 3-0 nylon followed by sterile dressings   Disposition: To PACU stable   V. [name], M.D. [facility] [place] Office: [phone] Pager:  [phone]

## 2017-01-06 NOTE — Anesthesia Preprocedure Evaluation (Addendum)
Anesthesia Evaluation  Patient identified by MRN, date of birth, ID band Patient awake    Reviewed: Allergy & Precautions, NPO status , Patient's Chart, lab work & pertinent test results  History of Anesthesia Complications Negative for: history of anesthetic complications  Airway Mallampati: I  TM Distance: >3 FB Neck ROM: Full    Dental  (+) Teeth Intact   Pulmonary former smoker,    breath sounds clear to auscultation       Cardiovascular hypertension, Pt. on medications (-) Past MI + dysrhythmias  Rhythm:Regular Rate:Normal  ECG: SR, rate 87. Hx RBBB and LAFB   Neuro/Psych negative neurological ROS  negative psych ROS   GI/Hepatic Neg liver ROS, GERD  Medicated and Controlled,  Endo/Other  Morbid obesity  Renal/GU ESRF and DialysisRenal disease     Musculoskeletal  (+) Arthritis ,   Abdominal (+) + obese,   Peds  Hematology  (+) anemia ,   Anesthesia Other Findings Hyperlipidemia   Reproductive/Obstetrics                           Anesthesia Physical  Anesthesia Plan  ASA: IV  Anesthesia Plan: MAC   Post-op Pain Management:    Induction: Intravenous  PONV Risk Score and Plan: 1 and Treatment may vary due to age or medical condition and Propofol infusion  Airway Management Planned: Simple Face Mask  Additional Equipment: None  Intra-op Plan:   Post-operative Plan:   Informed Consent: I have reviewed the patients History and Physical, chart, labs and discussed the procedure including the risks, benefits and alternatives for the proposed anesthesia with the patient or authorized representative who has indicated his/her understanding and acceptance.   Dental advisory given  Plan Discussed with: CRNA  Anesthesia Plan Comments:         Anesthesia Quick Evaluation

## 2017-01-06 NOTE — Progress Notes (Signed)
Patient seen and examined. Admitted after midnight secondary to bleeding from left upper arm AV fistula. No CP, no SOB, no nausea, no vomiting and hemodynamically stable. Please refer to H&P dictated by Dr. Hal Hope for further info/details. Patient seen by vascular surgery and with plans to be taken to the OR later today to fixed bleeding area of his fistula.   Barton Dubois MD [phone]

## 2017-01-06 NOTE — Progress Notes (Addendum)
Patient going to OR. RN called short stay and gave report to the nurse who is going to receive the patient.

## 2017-01-06 NOTE — Anesthesia Postprocedure Evaluation (Signed)
Anesthesia Post Note  Patient: Arsh Feutz  Procedure(s) Performed: REVISON OF ARTERIOVENOUS FISTULA LEFT ARM (Left Arm Upper)     Patient location during evaluation: PACU Anesthesia Type: MAC Level of consciousness: awake and alert Pain management: pain level controlled Vital Signs Assessment: post-procedure vital signs reviewed and stable Respiratory status: spontaneous breathing, nonlabored ventilation, respiratory function stable and patient connected to nasal cannula oxygen Cardiovascular status: stable and blood pressure returned to baseline Postop Assessment: no apparent nausea or vomiting Anesthetic complications: no    Last Vitals:  Vitals:   01/06/17 1940 01/06/17 1957  BP: (!) 104/57 (!) 98/42  Pulse: 69 69  Resp: 14 16  Temp: 36.9 C 36.9 C  SpO2: 95% 93%    Last Pain:  Vitals:   01/06/17 1940  TempSrc:   PainSc: 0-No pain                 Effie Berkshire

## 2017-01-06 NOTE — Progress Notes (Signed)
Pt left arm fistula observed to be oozing blood. Stitches appear to be somewhat open in the middle. Covered with 4x4 gauze, and paged MD on call. Will continue to monitor.

## 2017-01-06 NOTE — Transfer of Care (Signed)
Immediate Anesthesia Transfer of Care Note  Patient: Richard Bush  Procedure(s) Performed: REVISON OF ARTERIOVENOUS FISTULA LEFT ARM (Left Arm Upper)  Patient Location: PACU  Anesthesia Type:MAC  Level of Consciousness: awake, alert  and oriented  Airway & Oxygen Therapy: Patient Spontanous Breathing  Post-op Assessment: Report given to RN and Post -op Vital signs reviewed and stable  Post vital signs: Reviewed and stable  Last Vitals:  Vitals:   01/06/17 1425 01/06/17 1914  BP: (!) 86/34   Pulse: 78   Resp: 16   Temp: 36.8 C (P) 36.6 C  SpO2: 100%     Last Pain:  Vitals:   01/06/17 1425  TempSrc: Oral  PainSc:          Complications: No apparent anesthesia complications

## 2017-01-06 NOTE — Progress Notes (Signed)
Pt arrived to unit and ambulated to bed. Identified appropriately. Assessed thoroughly, including skin, Cristal Deer assisting. Skin intact except left arm fistula with stitches in it. BP 105/43, HR 81, RR 20, O2 98. Notified MD on call regarding BP reading. Pt denies pain or dizziness, observed to be steady when ambulating. Pt wife at bedside. Placed on telemetry, oriented to room and call bell within reach. Will continue to monitor.

## 2017-01-06 NOTE — ED Notes (Signed)
Family at bedside. 

## 2017-01-06 NOTE — Consult Note (Signed)
[facility]  Patient name: Richard Bush MRN: [id number] DOB: 1950-11-15 Sex: male   REQUESTING PROVIDER:    ER   REASON FOR CONSULT:    Bleeding HD access  HISTORY OF PRESENT ILLNESS:   Richard Bush is a 66 y.o. male, who has dialysis in [place].  He underwent a fistulogram for bleeding approximately 2 weeks ago which showed no evidence of stenosis.  He did have an area in the skin and sutured closed.  He has been having issues with intermittent bleeding some of which have been significant.  He was seen by Dr. Florene Glen in dialysis who contacted me.  The patient comes to the ER for admission and surgical repair of his fistula.  Patient's renal failure is secondary to hypertension.  He is on dialysis Monday Wednesday Friday.  He is not on anticoagulation.  PAST MEDICAL HISTORY    Past Medical History:  Diagnosis Date  . Anemia   . Arthritis   . Chronic kidney disease    dialysis M/W/F- [facility]  . GERD (gastroesophageal reflux disease)   . Gout   . Hypertension   . Neuropathy   . Pleurisy   . Pneumonia      FAMILY HISTORY   Family History  Problem Relation Age of Onset  . Heart disease Mother   . Cancer Father     SOCIAL HISTORY:   Social History   Socioeconomic History  . Marital status: Married    Spouse name: Not on file  . Number of children: Not on file  . Years of education: Not on file  . Highest education level: Not on file  Social Needs  . Financial resource strain: Not on file  . Food insecurity - worry: Not on file  . Food insecurity - inability: Not on file  . Transportation needs - medical: Not on file  . Transportation needs - non-medical: Not on file  Occupational History  . Not on file  Tobacco Use  . Smoking status: Former Smoker    Packs/day: 1.00    Years: 10.00    Pack years: 10.00    Types: Cigarettes    Last attempt to quit: 08/13/1982    Years since  quitting: 34.4  . Smokeless tobacco: Former Network engineer and Sexual Activity  . Alcohol use: Yes    Comment: rarely  . Drug use: No  . Sexual activity: Not on file  Other Topics Concern  . Not on file  Social History Narrative  . Not on file    ALLERGIES:    Allergies  Allergen Reactions  . Cozaar [Losartan Potassium] Other (See Comments)    Stopped due to kidney decline 4/18  . Kayexalate [Polystyrene] Other (See Comments)    Abnormal ekg  . Altace [Ramipril] Cough    Chronic cough  . Other Swelling    "Symvisc"   . Cholestyramine Light Other (See Comments)    UNSPECIFIED REACTION d  . Sulfa Antibiotics Rash    CURRENT MEDICATIONS:    No current facility-administered medications for this encounter.     REVIEW OF SYSTEMS:   [X]  denotes positive finding, [ ]  denotes negative finding Cardiac  Comments:  Chest pain or chest pressure:    Shortness of breath upon exertion:    Short of breath when lying flat:    Irregular heart rhythm:        Vascular    Pain in calf, thigh, or hip brought on  by ambulation:    Pain in feet at night that wakes you up from your sleep:     Blood clot in your veins:    Leg swelling:         Pulmonary    Oxygen at home:    Productive cough:     Wheezing:         Neurologic    Sudden weakness in arms or legs:     Sudden numbness in arms or legs:     Sudden onset of difficulty speaking or slurred speech:    Temporary loss of vision in one eye:     Problems with dizziness:         Gastrointestinal    Blood in stool:      Vomited blood:         Genitourinary    Burning when urinating:     Blood in urine:        Psychiatric    Major depression:         Hematologic    Bleeding problems:    Problems with blood clotting too easily:        Skin    Rashes or ulcers:        Constitutional    Fever or chills:     PHYSICAL EXAM:   Vitals:   01/06/17 0044 01/06/17 0100 01/06/17 0108 01/06/17 0340  BP: 103/82 (!)  104/49 (!) 105/43 (!) 97/48  Pulse: 79  81 78  Resp: 13  20 16   Temp:   98.1 F (36.7 C) 98 F (36.7 C)  TempSrc:   Oral Oral  SpO2: 98%  98% 98%    GENERAL: The patient is a well-nourished male, in no acute distress. The vital signs are documented above. CARDIAC: There is a regular rate and rhythm.  VASCULAR: Palpable thrill within the fistula.  Sutures are present in the midportion of the fistula.  No surrounding erythema. PULMONARY: Nonlabored respirations  MUSCULOSKELETAL: There are no major deformities or cyanosis. NEUROLOGIC: No focal weakness or paresthesias are detected. SKIN: Suture line eschar in the left upper arm fistula PSYCHIATRIC: The patient has a normal affect.  STUDIES:   I have reviewed his fistulogram which shows no evidence of stenosis in the central venous system.  ASSESSMENT and PLAN   Recurrent bleeding from left arm AV fistula: I discussed with the patient that we would plan on going to the operating room for repair of the area that is bleeding in the midportion of the fistula.  This will most likely involve resection of the affected skin area and closure of a venotomy.  This will be done today.  All of his questions were answered.  He will be n.p.o.   [name], MD [facility] [phone] Pager [phone]

## 2017-01-06 NOTE — Progress Notes (Signed)
Received report from Doren Custard, RN in PACU at patient bedside. No orders placed for patient. [place] notified. Awaiting orders at this time. Will continue to monitor and treat per MD orders.

## 2017-01-07 ENCOUNTER — Encounter (HOSPITAL_COMMUNITY): Payer: Self-pay | Admitting: Surgery

## 2017-01-07 DIAGNOSIS — N2581 Secondary hyperparathyroidism of renal origin: Secondary | ICD-10-CM

## 2017-01-07 DIAGNOSIS — D631 Anemia in chronic kidney disease: Secondary | ICD-10-CM

## 2017-01-07 DIAGNOSIS — R42 Dizziness and giddiness: Secondary | ICD-10-CM

## 2017-01-07 LAB — CBC WITH DIFFERENTIAL/PLATELET
Basophils Absolute: 0 10*3/uL (ref 0.0–0.1)
Basophils Relative: 0 %
Eosinophils Absolute: 0.2 10*3/uL (ref 0.0–0.7)
Eosinophils Relative: 4 %
HCT: 27.6 % — ABNORMAL LOW (ref 39.0–52.0)
Hemoglobin: 9.1 g/dL — ABNORMAL LOW (ref 13.0–17.0)
Lymphocytes Relative: 35 %
Lymphs Abs: 1.9 10*3/uL (ref 0.7–4.0)
MCH: 33.6 pg (ref 26.0–34.0)
MCHC: 33 g/dL (ref 30.0–36.0)
MCV: 101.8 fL — ABNORMAL HIGH (ref 78.0–100.0)
Monocytes Absolute: 0.4 10*3/uL (ref 0.1–1.0)
Monocytes Relative: 7 %
Neutro Abs: 3 10*3/uL (ref 1.7–7.7)
Neutrophils Relative %: 54 %
Platelets: 191 10*3/uL (ref 150–400)
RBC: 2.71 MIL/uL — ABNORMAL LOW (ref 4.22–5.81)
RDW: 15.5 % (ref 11.5–15.5)
WBC: 5.5 10*3/uL (ref 4.0–10.5)

## 2017-01-07 LAB — BASIC METABOLIC PANEL
Anion gap: 14 (ref 5–15)
BUN: 46 mg/dL — ABNORMAL HIGH (ref 6–20)
CO2: 30 mmol/L (ref 22–32)
Calcium: 8.8 mg/dL — ABNORMAL LOW (ref 8.9–10.3)
Chloride: 96 mmol/L — ABNORMAL LOW (ref 101–111)
Creatinine, Ser: 9.12 mg/dL — ABNORMAL HIGH (ref 0.61–1.24)
GFR calc Af Amer: 6 mL/min — ABNORMAL LOW (ref >60)
GFR calc non Af Amer: 5 mL/min — ABNORMAL LOW (ref >60)
Glucose, Bld: 99 mg/dL (ref 65–99)
Potassium: 3.6 mmol/L (ref 3.5–5.1)
Sodium: 140 mmol/L (ref 135–145)

## 2017-01-07 NOTE — Discharge Summary (Signed)
Physician Discharge Summary  Richard Bush ERD:[id number] DOB: May 05, 1950 DOA: 01/05/2017  PCP: Quentin Cornwall, MD  Admit date: 01/05/2017 Discharge date: 01/07/2017  Time spent: 30 minutes  Recommendations for Outpatient Follow-up:  1. Repeat CBC to follow hemoglobin trend.   Discharge Diagnoses:  Active Problems:   Secondary hyperparathyroidism ([place])   ESRD on dialysis ([place])   Anemia secondary to renal failure   Bleeding pseudoaneurysm of left brachiocephalic AV fistula, initial encounter ([place])   Orthostatic dizziness   Discharge Condition: Stable and improved.  Patient discharge home with instruction to follow-up with his PCP in 2 weeks.  Patient will also follow with vascular surgery as an outpatient.  Diet recommendation: Heart healthy diet.  There were no vitals filed for this visit.  History of present illness:  Per H&P written by Dr. Hal Hope on 01/06/17 66 y.o. male with history of ESRD on hemodialysis on Monday Wednesday Friday on alternate daily for dialysis due to holiday schedule had experienced bleeding from the left AV fistula 2 weeks ago which was sutured.  She had another episode of bleeding 2 nights ago which was self contained.  Patient had dialysis yesterday following which patient nephrology evaluated the fistula and felt that needs to be ablated by vascular surgeon and was referred to the ER  Hospital Course:  1-bleeding out of left AV fistula: With concern for pseudoaneurysm of the left brachiocephalic AV fistula. -Patient seen by vascular surgery and taken to the OR for surgical repair -Resection of a necrotic section in his fistula was done with proper suture and stabilization. -No further bleeding appreciated. -Patient will be able to use AV fistula doing cannulization above current sutures. -No pain and good thrill at the moment of discharge.  2-end-stage renal disease on hemodialysis -Continue outpatient treatment -Patient did not require  hemodialysis as inpatient.  3-anemia of chronic kidney disease: -Hemoglobin above 9 -Continue Epogen and iron supplementation.  4-hyperlipidemia -Continue statins  5-history of gout -Currently stable and without acute flare. -Continue preventive measurements.  6-orthostatic dizziness: -Continue the use of midodrine as previously prescribed   *The rest of his medical problem has remained stable and the plan is to continue actual medical management as dictated by his nephrologist and primary care physician.   Procedures:  Resection of necrotic ulcer over left upper arm AV fistula (01/06/17).  Consultations:  Vascular surgery   Discharge Exam: Vitals:   01/06/17 2347 01/07/17 0545  BP: (!) 100/48 (!) 90/40  Pulse: 72 80  Resp:  16  Temp:  98.2 F (36.8 C)  SpO2:  96%    General: Afebrile, no chest pain, no nausea, no vomiting, no shortness of breath.  No further bleeding appreciated out of his left upper extremity. Cardiovascular: S1 and S2, no rubs, no gallops. Respiratory: good air movement, no wheezing, no crackles Abd: soft, NT, ND, positive BS Extremities: no edema, no cyanosis; no clubbing. LUE with good thrill on his AV fistula, no further bleeding and denying pain.  Discharge Instructions   Discharge Instructions    Diet - low sodium heart healthy   Complete by:  As directed    Discharge instructions   Complete by:  As directed    Take medications as prescribed  Maintain adequate hydration Follow low sodium diet (< 2.5 gram daily) Follow low calorie diet and portion control Follow up with PCP in 2 weeks     Current Discharge Medication List    CONTINUE these medications which have NOT CHANGED   Details  Acetaminophen (TYLENOL ARTHRITIS PAIN PO) Take 650 mg by mouth daily as needed.    calcium acetate (PHOSLO) 667 MG capsule Take 1 capsule (667 mg total) by mouth 3 (three) times daily with meals. Qty: 90 capsule, Refills: 0    cetirizine  (ZYRTEC) 10 MG tablet Take 10 mg by mouth at bedtime.    febuxostat (ULORIC) 40 MG tablet Take 1 tablet (40 mg total) by mouth daily. Qty: 90 tablet, Refills: 1    ferric citrate (AURYXIA) 1 GM 210 MG(Fe) tablet Take 210 mg by mouth 3 (three) times daily with meals.     folic acid-vitamin b complex-vitamin c-selenium-zinc (DIALYVITE) 3 MG TABS tablet Take 1 tablet by mouth daily.    gabapentin (NEURONTIN) 300 MG capsule Take 300 mg by mouth at bedtime.     ibuprofen (ADVIL,MOTRIN) 200 MG tablet Take 400 mg by mouth every 6 (six) hours as needed.    loperamide (IMODIUM) 2 MG capsule Take by mouth as needed for diarrhea or loose stools.    ranitidine (ZANTAC) 150 MG tablet Take 150 mg by mouth every morning.     simvastatin (ZOCOR) 20 MG tablet Take 20 mg by mouth every morning.    colchicine 0.6 MG tablet Take 0.5 tablets (0.3 mg total) by mouth daily as needed (for gout flareups).    oxyCODONE-acetaminophen (ROXICET) 5-325 MG tablet Take 1 tablet by mouth every 6 (six) hours as needed for severe pain. Qty: 10 tablet, Refills: 0       Allergies  Allergen Reactions  . Cozaar [Losartan Potassium] Other (See Comments)    Stopped due to kidney decline 4/18  . Kayexalate [Polystyrene] Other (See Comments)    Abnormal ekg  . Altace [Ramipril] Cough    Chronic cough  . Other Swelling    "Symvisc"   . Cholestyramine Light Other (See Comments)    UNSPECIFIED REACTION d  . Sulfa Antibiotics Rash   Follow-up Information    Quentin Cornwall, MD. Schedule an appointment as soon as possible for a visit in 2 week(s).   Specialty:  Family Medicine Contact information: [address], [place] [address] [phone]           The results of significant diagnostics from this hospitalization (including imaging, microbiology, ancillary and laboratory) are listed below for reference.    Significant Diagnostic Studies: No results found.  Microbiology: Recent Results  (from the past 240 hour(s))  Surgical PCR screen     Status: None   Collection Time: 01/06/17  7:58 AM  Result Value Ref Range Status   MRSA, PCR NEGATIVE NEGATIVE Final   Staphylococcus aureus NEGATIVE NEGATIVE Final    Comment: (NOTE) The Xpert SA Assay (FDA approved for NASAL specimens in patients 49 years of age and older), is one component of a comprehensive surveillance program. It is not intended to diagnose infection nor to guide or monitor treatment.      Labs: Basic Metabolic Panel: Recent Labs  Lab 01/05/17 2255 01/07/17 0152  NA 138 140  K 3.4* 3.6  CL 94* 96*  CO2 32 30  GLUCOSE 101* 99  BUN 27* 46*  CREATININE 5.79* 9.12*  CALCIUM 9.1 8.8*   CBC: Recent Labs  Lab 01/05/17 2255 01/07/17 0152  WBC 7.3 5.5  NEUTROABS 4.2 3.0  HGB 10.4* 9.1*  HCT 31.7* 27.6*  MCV 101.6* 101.8*  PLT 228 191    Signed:  Barton Dubois MD.  [facility] 01/07/2017, 12:16 PM

## 2017-01-07 NOTE — Progress Notes (Signed)
Kirby,NP returned page and informed this RN to page the surgeon (Brabham,MD) for orders on this patient after surgery. RN paged the on-call and Jay Schlichter returned page and told RN to page Brabham,MD instead of him. Fields,MD gave this RN Brabham,MD personal pager number. After paging Brabham,MD at 2030, OR RN called me back and told me that the MD was currently putting in orders for patient. At 2100, there were still no orders placed for this patient. Charge RN Sonia Baller paged Brabham,MD twice with no response and no orders were ever placed. Charge RN Sonia Baller paged Kirby,NP again letting her know that we could not get in touch with Brabham,MD for orders.  She placed orders for patient during his stay for tonight. Will continue to monitor and treat per MD orders.

## 2017-01-07 NOTE — Progress Notes (Signed)
   VASCULAR SURGERY ASSESSMENT & PLAN:   1 Day Post-Op s/p: Resection of necrotic ulcer over left upper arm AV fistula.  The fistula has a good thrill.  He does describe some steal symptoms which are not new.  He can be discharged from our standpoint.  I have encouraged him to notify our office if his steal symptoms progress.  SUBJECTIVE:   No complaints except for paresthesias in his fingers at times which is chronic.  PHYSICAL EXAM:   Vitals:   01/06/17 1940 01/06/17 1957 01/06/17 2347 01/07/17 0545  BP: (!) 104/57 (!) 98/42 (!) 100/48 (!) 90/40  Pulse: 69 69 72 80  Resp: 14 16  16   Temp: 98.4 F (36.9 C) 98.4 F (36.9 C)  98.2 F (36.8 C)  TempSrc:    Oral  SpO2: 95% 93%  96%   Good thrill in his left upper arm fistula. Palpable left radial pulse. Dressing is dry.  LABS:   Lab Results  Component Value Date   WBC 5.5 01/07/2017   HGB 9.1 (L) 01/07/2017   HCT 27.6 (L) 01/07/2017   MCV 101.8 (H) 01/07/2017   PLT 191 01/07/2017   Lab Results  Component Value Date   CREATININE 9.12 (H) 01/07/2017   Lab Results  Component Value Date   INR 1.11 01/05/2017   CBG (last 3)  No results for input(s): GLUCAP in the last 72 hours.  PROBLEM LIST:    Active Problems:   ESRD on dialysis ([place])   Bleeding pseudoaneurysm of left brachiocephalic AV fistula, initial encounter ([place])   CURRENT MEDS:   . calcium acetate  667 mg Oral TID WC  . famotidine  20 mg Oral Daily  . febuxostat  40 mg Oral Daily  . ferric citrate  210 mg Oral TID WC  . gabapentin  300 mg Oral QHS  . simvastatin  20 mg Oral q morning - 10a    Deitra Mayo Beeper: [phone] Office: [phone] 01/07/2017

## 2017-01-07 NOTE — Progress Notes (Signed)
[name] to be D/C'd home per MD order. Discussed with the patient and all questions fully answered.  Allergies as of 01/07/2017      Reactions   Cozaar [losartan Potassium] Other (See Comments)   Stopped due to kidney decline 4/18   Kayexalate [polystyrene] Other (See Comments)   Abnormal ekg   Altace [ramipril] Cough   Chronic cough   Other Swelling   "Symvisc"    Cholestyramine Light Other (See Comments)   UNSPECIFIED REACTION d   Sulfa Antibiotics Rash      Medication List    TAKE these medications   AURYXIA 1 GM 210 MG(Fe) tablet Generic drug:  ferric citrate Take 210 mg by mouth 3 (three) times daily with meals.   calcium acetate 667 MG capsule Commonly known as:  PHOSLO Take 1 capsule (667 mg total) by mouth 3 (three) times daily with meals.   cetirizine 10 MG tablet Commonly known as:  ZYRTEC Take 10 mg by mouth at bedtime.   colchicine 0.6 MG tablet Take 0.5 tablets (0.3 mg total) by mouth daily as needed (for gout flareups).   febuxostat 40 MG tablet Commonly known as:  ULORIC Take 1 tablet (40 mg total) by mouth daily.   folic acid-vitamin b complex-vitamin c-selenium-zinc 3 MG Tabs tablet Take 1 tablet by mouth daily.   gabapentin 300 MG capsule Commonly known as:  NEURONTIN Take 300 mg by mouth at bedtime.   ibuprofen 200 MG tablet Commonly known as:  ADVIL,MOTRIN Take 400 mg by mouth every 6 (six) hours as needed.   loperamide 2 MG capsule Commonly known as:  IMODIUM Take by mouth as needed for diarrhea or loose stools.   oxyCODONE-acetaminophen 5-325 MG tablet Commonly known as:  ROXICET Take 1 tablet by mouth every 6 (six) hours as needed for severe pain.   ranitidine 150 MG tablet Commonly known as:  ZANTAC Take 150 mg by mouth every morning.   simvastatin 20 MG tablet Commonly known as:  ZOCOR Take 20 mg by mouth every morning.   TYLENOL ARTHRITIS PAIN PO Take 650 mg by mouth daily as needed.       VVS, Skin clean, dry and  intact without evidence of skin break down, no evidence of skin tears noted.  IV catheter discontinued intact. Site without signs and symptoms of complications. Dressing and pressure applied.  An After Visit Summary was printed and given to the patient.  Patient escorted via [place], and D/C home via private auto.  Melonie Florida  01/07/2017 12:52 PM

## 2017-01-15 ENCOUNTER — Other Ambulatory Visit: Payer: Self-pay

## 2017-01-15 ENCOUNTER — Encounter: Payer: Self-pay | Admitting: Vascular Surgery

## 2017-01-15 ENCOUNTER — Ambulatory Visit (INDEPENDENT_AMBULATORY_CARE_PROVIDER_SITE_OTHER): Payer: Medicare Other | Admitting: Vascular Surgery

## 2017-01-15 VITALS — BP 106/57 | HR 67 | Temp 97.7°F | Resp 16 | Ht 71.0 in | Wt 317.0 lb

## 2017-01-15 DIAGNOSIS — Z992 Dependence on renal dialysis: Secondary | ICD-10-CM

## 2017-01-15 DIAGNOSIS — N186 End stage renal disease: Secondary | ICD-10-CM

## 2017-01-15 NOTE — Progress Notes (Signed)
Subjective:     Patient ID: Richard Bush, male   DOB: [birth year], 66 y.o.   MRN: [id number]  HPI 66 year old male follows up from recent revision left upper arm AV fistula.  He is now on dialysis again via this fistula and tolerating it well.  He has no left hand symptoms.  The wound is healing well with sutures in place.   Review of Systems No complaints today    Objective:   Physical Exam aaox3 Wound healing well with sutures in place Strong thrill in left upper arm    Assessment/plan     66 year old male follows up from recent revision left upper arm AV fistula.  Sutures were removed today.  They can begin cannulating at the wound site in approximately 4 weeks when it has healed.  He can follow-up on a as needed basis unless wound has further issues.  There is plenty of room to cannulate above and below the wound at this time.  He demonstrates good understanding.  Katherine Syme C. Donzetta Matters, MD [facility] Office: [phone] Pager: [phone]

## 2017-01-17 ENCOUNTER — Encounter (HOSPITAL_COMMUNITY): Payer: Self-pay | Admitting: Emergency Medicine

## 2017-01-17 ENCOUNTER — Ambulatory Visit (HOSPITAL_COMMUNITY)
Admission: EM | Admit: 2017-01-17 | Discharge: 2017-01-17 | Disposition: A | Discharge status: Home / Self Care | Payer: Medicare Other | Attending: Emergency Medicine | Admitting: Emergency Medicine

## 2017-01-17 ENCOUNTER — Emergency Department (HOSPITAL_COMMUNITY): Payer: Medicare Other | Admitting: Certified Registered Nurse Anesthetist

## 2017-01-17 ENCOUNTER — Other Ambulatory Visit: Payer: Self-pay

## 2017-01-17 ENCOUNTER — Encounter (HOSPITAL_COMMUNITY)
Admission: EM | Disposition: A | Discharge status: Home / Self Care | Payer: Self-pay | Source: Home / Self Care | Attending: Emergency Medicine

## 2017-01-17 DIAGNOSIS — Z96653 Presence of artificial knee joint, bilateral: Secondary | ICD-10-CM | POA: Insufficient documentation

## 2017-01-17 DIAGNOSIS — E785 Hyperlipidemia, unspecified: Secondary | ICD-10-CM | POA: Insufficient documentation

## 2017-01-17 DIAGNOSIS — Z992 Dependence on renal dialysis: Secondary | ICD-10-CM | POA: Diagnosis not present

## 2017-01-17 DIAGNOSIS — Y832 Surgical operation with anastomosis, bypass or graft as the cause of abnormal reaction of the patient, or of later complication, without mention of misadventure at the time of the procedure: Secondary | ICD-10-CM | POA: Insufficient documentation

## 2017-01-17 DIAGNOSIS — Z96641 Presence of right artificial hip joint: Secondary | ICD-10-CM | POA: Diagnosis not present

## 2017-01-17 DIAGNOSIS — I120 Hypertensive chronic kidney disease with stage 5 chronic kidney disease or end stage renal disease: Secondary | ICD-10-CM | POA: Diagnosis not present

## 2017-01-17 DIAGNOSIS — E7800 Pure hypercholesterolemia, unspecified: Secondary | ICD-10-CM | POA: Diagnosis not present

## 2017-01-17 DIAGNOSIS — T82898A Other specified complication of vascular prosthetic devices, implants and grafts, initial encounter: Secondary | ICD-10-CM | POA: Insufficient documentation

## 2017-01-17 DIAGNOSIS — M109 Gout, unspecified: Secondary | ICD-10-CM | POA: Diagnosis not present

## 2017-01-17 DIAGNOSIS — D631 Anemia in chronic kidney disease: Secondary | ICD-10-CM | POA: Diagnosis not present

## 2017-01-17 DIAGNOSIS — Z87891 Personal history of nicotine dependence: Secondary | ICD-10-CM | POA: Insufficient documentation

## 2017-01-17 DIAGNOSIS — Z6841 Body Mass Index (BMI) 40.0 and over, adult: Secondary | ICD-10-CM | POA: Diagnosis not present

## 2017-01-17 DIAGNOSIS — K219 Gastro-esophageal reflux disease without esophagitis: Secondary | ICD-10-CM | POA: Insufficient documentation

## 2017-01-17 DIAGNOSIS — T8131XA Disruption of external operation (surgical) wound, not elsewhere classified, initial encounter: Secondary | ICD-10-CM | POA: Diagnosis not present

## 2017-01-17 DIAGNOSIS — Z79899 Other long term (current) drug therapy: Secondary | ICD-10-CM | POA: Insufficient documentation

## 2017-01-17 DIAGNOSIS — N186 End stage renal disease: Secondary | ICD-10-CM | POA: Insufficient documentation

## 2017-01-17 DIAGNOSIS — T8130XA Disruption of wound, unspecified, initial encounter: Secondary | ICD-10-CM

## 2017-01-17 HISTORY — PX: THROMBECTOMY AND REVISION OF ARTERIOVENTOUS (AV) GORETEX  GRAFT: SHX6120

## 2017-01-17 LAB — POCT I-STAT 4, (NA,K, GLUC, HGB,HCT)
GLUCOSE: 86 mg/dL (ref 65–99)
HCT: 30 % — ABNORMAL LOW (ref 39.0–52.0)
Hemoglobin: 10.2 g/dL — ABNORMAL LOW (ref 13.0–17.0)
Potassium: 5.6 mmol/L — ABNORMAL HIGH (ref 3.5–5.1)
Sodium: 142 mmol/L (ref 135–145)

## 2017-01-17 SURGERY — THROMBECTOMY AND REVISION OF ARTERIOVENTOUS (AV) GORETEX  GRAFT
Anesthesia: Monitor Anesthesia Care | Site: Arm Upper | Laterality: Left

## 2017-01-17 MED ORDER — PHENYLEPHRINE 40 MCG/ML (10ML) SYRINGE FOR IV PUSH (FOR BLOOD PRESSURE SUPPORT)
PREFILLED_SYRINGE | INTRAVENOUS | Status: DC | PRN
  Administered 2017-01-17: 80 ug via INTRAVENOUS

## 2017-01-17 MED ORDER — FENTANYL CITRATE (PF) 100 MCG/2ML IJ SOLN
INTRAMUSCULAR | Status: DC | PRN
  Administered 2017-01-17: 50 ug via INTRAVENOUS

## 2017-01-17 MED ORDER — CEFAZOLIN SODIUM-DEXTROSE 2-4 GM/100ML-% IV SOLN
2.0000 g | Freq: Once | INTRAVENOUS | Status: AC
  Administered 2017-01-17: 2 g via INTRAVENOUS
  Filled 2017-01-17: qty 100

## 2017-01-17 MED ORDER — 0.9 % SODIUM CHLORIDE (POUR BTL) OPTIME
TOPICAL | Status: DC | PRN
Start: 2017-01-17 — End: 2017-01-17
  Administered 2017-01-17: 1000 mL

## 2017-01-17 MED ORDER — MIDAZOLAM HCL 2 MG/2ML IJ SOLN
INTRAMUSCULAR | Status: AC
  Filled 2017-01-17: qty 2

## 2017-01-17 MED ORDER — SODIUM CHLORIDE 0.9 % IV SOLN
INTRAVENOUS | Status: DC
  Administered 2017-01-17: 09:00:00 via INTRAVENOUS

## 2017-01-17 MED ORDER — CEFAZOLIN SODIUM 1 G IJ SOLR
INTRAMUSCULAR | Status: AC
  Filled 2017-01-17: qty 10

## 2017-01-17 MED ORDER — LIDOCAINE-EPINEPHRINE (PF) 1 %-1:200000 IJ SOLN: INTRAMUSCULAR | Status: DC | PRN

## 2017-01-17 MED ORDER — PROPOFOL 500 MG/50ML IV EMUL
INTRAVENOUS | Status: DC | PRN
  Administered 2017-01-17: 100 ug/kg/min via INTRAVENOUS

## 2017-01-17 MED ORDER — LIDOCAINE 2% (20 MG/ML) 5 ML SYRINGE
INTRAMUSCULAR | Status: DC | PRN
  Administered 2017-01-17: 20 mg via INTRAVENOUS

## 2017-01-17 MED ORDER — LIDOCAINE HCL (PF) 1 % IJ SOLN
5.0000 mL | Freq: Once | INTRAMUSCULAR | Status: AC
  Administered 2017-01-17: 5 mL via INTRADERMAL
  Filled 2017-01-17: qty 5

## 2017-01-17 MED ORDER — FENTANYL CITRATE (PF) 250 MCG/5ML IJ SOLN
INTRAMUSCULAR | Status: AC
  Filled 2017-01-17: qty 5

## 2017-01-17 MED ORDER — LIDOCAINE HCL 1 % IJ SOLN
INTRAMUSCULAR | Status: DC | PRN
  Administered 2017-01-17: 9 mL

## 2017-01-17 MED ORDER — MIDAZOLAM HCL 5 MG/5ML IJ SOLN
INTRAMUSCULAR | Status: DC | PRN
  Administered 2017-01-17: 1 mg via INTRAVENOUS

## 2017-01-17 MED ORDER — BACITRACIN ZINC 500 UNIT/GM EX OINT
TOPICAL_OINTMENT | CUTANEOUS | Status: AC
  Filled 2017-01-17: qty 28.35

## 2017-01-17 MED ORDER — SODIUM CHLORIDE 0.9 % IJ SOLN
INTRAMUSCULAR | Status: AC
  Filled 2017-01-17: qty 10

## 2017-01-17 MED ORDER — PHENYLEPHRINE 40 MCG/ML (10ML) SYRINGE FOR IV PUSH (FOR BLOOD PRESSURE SUPPORT)
PREFILLED_SYRINGE | INTRAVENOUS | Status: AC
  Filled 2017-01-17: qty 10

## 2017-01-17 MED ORDER — PROPOFOL 10 MG/ML IV BOLUS
INTRAVENOUS | Status: AC
  Filled 2017-01-17: qty 20

## 2017-01-17 MED ORDER — LIDOCAINE HCL 1 % IJ SOLN
INTRAMUSCULAR | Status: AC
  Filled 2017-01-17: qty 20

## 2017-01-17 MED ORDER — PHENYLEPHRINE HCL 10 MG/ML IJ SOLN
INTRAVENOUS | Status: DC | PRN
  Administered 2017-01-17: 25 ug/min via INTRAVENOUS

## 2017-01-17 MED ORDER — CEFAZOLIN SODIUM-DEXTROSE 1-4 GM/50ML-% IV SOLN
INTRAVENOUS | Status: DC | PRN
  Administered 2017-01-17: 1 g via INTRAVENOUS

## 2017-01-17 SURGICAL SUPPLY — 34 items
ADH SKN CLS APL DERMABOND .7 (GAUZE/BANDAGES/DRESSINGS) ×1
ARMBAND PINK RESTRICT EXTREMIT (MISCELLANEOUS) ×6 IMPLANT
CANISTER SUCT 3000ML PPV (MISCELLANEOUS) ×3 IMPLANT
CATH EMB 4FR 80CM (CATHETERS) ×3 IMPLANT
CLIP VESOCCLUDE MED 6/CT (CLIP) ×1 IMPLANT
CLIP VESOCCLUDE SM WIDE 6/CT (CLIP) ×1 IMPLANT
DERMABOND ADVANCED (GAUZE/BANDAGES/DRESSINGS) ×2
DERMABOND ADVANCED .7 DNX12 (GAUZE/BANDAGES/DRESSINGS) ×1 IMPLANT
ELECT REM PT RETURN 9FT ADLT (ELECTROSURGICAL) ×3
ELECTRODE REM PT RTRN 9FT ADLT (ELECTROSURGICAL) ×1 IMPLANT
GAUZE SPONGE 4X4 12PLY STRL LF (GAUZE/BANDAGES/DRESSINGS) ×2 IMPLANT
GLOVE BIO SURGEON STRL SZ7.5 (GLOVE) ×3 IMPLANT
GOWN STRL REUS W/ TWL LRG LVL3 (GOWN DISPOSABLE) ×2 IMPLANT
GOWN STRL REUS W/ TWL XL LVL3 (GOWN DISPOSABLE) ×1 IMPLANT
GOWN STRL REUS W/TWL LRG LVL3 (GOWN DISPOSABLE) ×3
GOWN STRL REUS W/TWL XL LVL3 (GOWN DISPOSABLE) ×3
INSERT FOGARTY SM (MISCELLANEOUS) ×3 IMPLANT
KIT BASIN OR (CUSTOM PROCEDURE TRAY) ×3 IMPLANT
KIT ROOM TURNOVER OR (KITS) ×3 IMPLANT
NS IRRIG 1000ML POUR BTL (IV SOLUTION) ×3 IMPLANT
PACK CV ACCESS (CUSTOM PROCEDURE TRAY) ×3 IMPLANT
PAD ARMBOARD 7.5X6 YLW CONV (MISCELLANEOUS) ×6 IMPLANT
SUT ETHILON 3 0 PS 1 (SUTURE) ×2 IMPLANT
SUT GORETEX 6.0 TH-9 30 IN (SUTURE) ×1 IMPLANT
SUT GORETEX CV-6TTC-13 36IN (SUTURE) ×1 IMPLANT
SUT MNCRL AB 4-0 PS2 18 (SUTURE) IMPLANT
SUT PROLENE 5 0 C 1 24 (SUTURE) ×2 IMPLANT
SUT PROLENE 6 0 BV (SUTURE) ×3 IMPLANT
SUT VIC AB 3-0 SH 27 (SUTURE) ×3
SUT VIC AB 3-0 SH 27X BRD (SUTURE) ×1 IMPLANT
TAPE CLOTH SURG 4X10 WHT LF (GAUZE/BANDAGES/DRESSINGS) ×2 IMPLANT
TOWEL GREEN STERILE (TOWEL DISPOSABLE) ×3 IMPLANT
UNDERPAD 30X30 (UNDERPADS AND DIAPERS) ×3 IMPLANT
WATER STERILE IRR 1000ML POUR (IV SOLUTION) ×3 IMPLANT

## 2017-01-17 NOTE — Anesthesia Preprocedure Evaluation (Signed)
Anesthesia Evaluation  Patient identified by MRN, date of birth, ID band Patient awake    Reviewed: Allergy & Precautions, NPO status , Patient's Chart, lab work & pertinent test results  History of Anesthesia Complications Negative for: history of anesthetic complications  Airway Mallampati: I  TM Distance: >3 FB Neck ROM: Full    Dental  (+) Teeth Intact   Pulmonary former smoker,    breath sounds clear to auscultation       Cardiovascular hypertension, Pt. on medications (-) Past MI + dysrhythmias  Rhythm:Regular Rate:Normal  ECG: SR, rate 87. Hx RBBB and LAFB   Neuro/Psych negative neurological ROS  negative psych ROS   GI/Hepatic Neg liver ROS, GERD  Medicated and Controlled,  Endo/Other  Morbid obesity  Renal/GU ESRF and DialysisRenal disease (MWF)     Musculoskeletal  (+) Arthritis , Osteoarthritis,    Abdominal (+) + obese,   Peds  Hematology  (+) anemia ,   Anesthesia Other Findings Hyperlipidemia   Reproductive/Obstetrics                             Anesthesia Physical  Anesthesia Plan  ASA: IV  Anesthesia Plan: MAC   Post-op Pain Management:    Induction: Intravenous  PONV Risk Score and Plan: 1 and Treatment may vary due to age or medical condition and Propofol infusion  Airway Management Planned: Simple Face Mask  Additional Equipment: None  Intra-op Plan:   Post-operative Plan:   Informed Consent: I have reviewed the patients History and Physical, chart, labs and discussed the procedure including the risks, benefits and alternatives for the proposed anesthesia with the patient or authorized representative who has indicated his/her understanding and acceptance.   Dental advisory given  Plan Discussed with: CRNA  Anesthesia Plan Comments:         Anesthesia Quick Evaluation

## 2017-01-17 NOTE — H&P (Signed)
Hospital Consult    Reason for Consult:  Left arm wound Referring Physician:  ED MRN #:  [id number]  History of Present Illness: This is a 66 y.o. male with history or revision of left upper arm avf. Sutures were removed by me on Friday. He now returns after the wound split open following a shower. He denies any blood. Denies pain. No constitutional symptoms.   Past Medical History:  Diagnosis Date  . Anemia   . Chronic right hip pain   . ESRD (end stage renal disease) on dialysis [facility])    dialysis M/W/F- [facility] (01/06/2017)  . GERD (gastroesophageal reflux disease)   . Gout   . High cholesterol   . Hypertension   . Neuropathy   . Osteoarthritis    "all over" (01/06/2017)  . Pleurisy   . Pneumonia 1980s X 1    Past Surgical History:  Procedure Laterality Date  . A/V FISTULAGRAM Left 12/24/2016   Procedure: A/V FISTULAGRAM;  Surgeon: Waynetta Sandy, MD;  Location: [facility];  Service: Cardiovascular;  Laterality: Left;  . APPENDECTOMY  1963  . AV FISTULA PLACEMENT Left 08/04/2016   Procedure: ARTERIOVENOUS (AV) FISTULA CREATION/LEFT ARM;  Surgeon: Serafina Mitchell, MD;  Location: [facility];  Service: Vascular;  Laterality: Left;  . AV FISTULA REPAIR Left 01/06/2017   arm  . BILATERAL CARPAL TUNNEL RELEASE Bilateral 2006-2007   right-left  . CHOLECYSTECTOMY OPEN  1979  . COLONOSCOPY    . FINGER SURGERY Right 2006   "thumb; took out bone that conects to wrist and a piece of tendon; weaved tendon back in for mobility"  . FISTULA SUPERFICIALIZATION Left 09/24/2016   Procedure: FISTULA SUPERFICIALIZATION- LEFT ARM;  Surgeon: Serafina Mitchell, MD;  Location: [place] [facility];  Service: Vascular;  Laterality: Left;  . HAMMER TOE SURGERY Left 1981  . INSERTION OF DIALYSIS CATHETER Left 08/04/2016   Procedure: INSERTION OF DIALYSIS CATHETER;  Surgeon: Serafina Mitchell, MD;  Location: [facility];  Service: Vascular;  Laterality: Left;  . JOINT REPLACEMENT    . LUMBAR  LAMINECTOMY/DECOMPRESSION MICRODISCECTOMY N/A 08/18/2012   Procedure: LUMBAR LAMINECTOMY/DECOMPRESSION MICRODISCECTOMY 3 LEVELS;  Surgeon: Hosie Spangle, MD;  Location: [place] [place] [facility];  Service: Neurosurgery;  Laterality: N/A;  Lumbar Two through Five Laminectomy  . REVISON OF ARTERIOVENOUS FISTULA Left 01/06/2017   Procedure: REVISON OF ARTERIOVENOUS FISTULA LEFT ARM;  Surgeon: Serafina Mitchell, MD;  Location: [facility];  Service: Vascular;  Laterality: Left;  . SHOULDER OPEN ROTATOR CUFF REPAIR Right 2001  . TOTAL HIP ARTHROPLASTY Right 07/26/2013   Procedure: TOTAL HIP ARTHROPLASTY;  Surgeon: Kerin Salen, MD;  Location: [place];  Service: Orthopedics;  Laterality: Right;  . TOTAL KNEE ARTHROPLASTY Bilateral 2008-2010   right-left  . WRIST TENDON TRANSFER Bilateral 1972-1973   left-right    Allergies  Allergen Reactions  . Cozaar [Losartan Potassium] Other (See Comments)    Stopped due to kidney decline 4/18  . Kayexalate [Polystyrene] Other (See Comments)    Abnormal ekg  . Altace [Ramipril] Cough    Chronic cough  . Other Swelling    "Symvisc"   . Cholestyramine Light Other (See Comments)    UNSPECIFIED REACTION d  . Sulfa Antibiotics Rash    Prior to Admission medications   Medication Sig Start Date End Date Taking? Authorizing Provider  acetaminophen (TYLENOL) 650 MG CR tablet Take 650 mg by mouth every 8 (eight) hours as needed for pain.   Yes [provider]  calcium acetate (PHOSLO) 667 MG capsule Take 1 capsule (667 mg total) by mouth 3 (three) times daily with meals. 08/08/16  Yes Ledell Noss, MD  cetirizine (ZYRTEC) 10 MG tablet Take 10 mg by mouth at bedtime.   Yes [provider]  colchicine 0.6 MG tablet Take 0.5 tablets (0.3 mg total) by mouth daily as needed (for gout flareups). 09/24/16 01/17/22 Yes Rhyne, Hulen Shouts, PA-C  febuxostat (ULORIC) 40 MG tablet Take 1 tablet (40 mg total) by mouth daily. Patient taking differently: Take 40 mg by mouth every  evening.  10/06/16 04/04/17 Yes Deveshwar, Abel Presto, MD  ferric citrate (AURYXIA) 1 GM 210 MG(Fe) tablet Take 210 mg by mouth 3 (three) times daily with meals.    Yes [provider]  folic acid-vitamin b complex-vitamin c-selenium-zinc (DIALYVITE) 3 MG TABS tablet Take 1 tablet by mouth every evening.    Yes [provider]  gabapentin (NEURONTIN) 300 MG capsule Take 300 mg by mouth at bedtime.    Yes [provider]  hydrALAZINE (APRESOLINE) 50 MG tablet Take 50 mg by mouth 3 (three) times daily. Only if BP above 150   Yes [provider]  ibuprofen (ADVIL,MOTRIN) 200 MG tablet Take 400 mg by mouth every 6 (six) hours as needed for mild pain.    Yes [provider]  midodrine (PROAMATINE) 10 MG tablet Take 10 mg by mouth every Monday, Wednesday, and Friday. 30 minutes before dialysis   Yes [provider]  ranitidine (ZANTAC) 150 MG tablet Take 150 mg by mouth every morning.    Yes [provider]  simvastatin (ZOCOR) 20 MG tablet Take 20 mg by mouth every evening.    Yes [provider]  oxyCODONE-acetaminophen (ROXICET) 5-325 MG tablet Take 1 tablet by mouth every 6 (six) hours as needed for severe pain. Patient not taking: Reported on 01/15/2017 09/24/16   Gabriel Earing, PA-C    Social History   Socioeconomic History  . Marital status: Married    Spouse name: Not on file  . Number of children: Not on file  . Years of education: Not on file  . Highest education level: Not on file  Social Needs  . Financial resource strain: Not on file  . Food insecurity - worry: Not on file  . Food insecurity - inability: Not on file  . Transportation needs - medical: Not on file  . Transportation needs - non-medical: Not on file  Occupational History  . Not on file  Tobacco Use  . Smoking status: Former Smoker    Packs/day: 1.00    Years: 10.00    Pack years: 10.00    Types: Cigarettes    Last attempt to quit: 08/13/1982     Years since quitting: 34.4  . Smokeless tobacco: Former Systems developer    Types: Chew  Substance and Sexual Activity  . Alcohol use: Yes    Comment: 01/06/2017 "used to be a social drinker; don't drink at all since ~ 2000"  . Drug use: No  . Sexual activity: Yes  Other Topics Concern  . Not on file  Social History Narrative  . Not on file     Family History  Problem Relation Age of Onset  . Heart disease Mother   . Cancer Father    REVIEW OF SYSTEMS (negative unless checked):   Cardiac:  []  Chest pain or chest pressure? []  Shortness of breath upon activity? []  Shortness of breath when lying flat? []  Irregular  heart rhythm?  Vascular:  []  Pain in calf, thigh, or hip brought on by walking? []  Pain in feet at night that wakes you up from your sleep? []  Blood clot in your veins? []  Leg swelling?  Pulmonary:  []  Oxygen at home? []  Productive cough? []  Wheezing?  Neurologic:  []  Sudden weakness in arms or legs? []  Sudden numbness in arms or legs? []  Sudden onset of difficult speaking or slurred speech? []  Temporary loss of vision in one eye? []  Problems with dizziness?  Gastrointestinal:  []  Blood in stool? []  Vomited blood?  Genitourinary:  []  Burning when urinating? []  Blood in urine?  Psychiatric:  []  Major depression  Hematologic:  []  Bleeding problems? []  Problems with blood clotting?  Dermatologic:  [x]  wounds or ulcers?  Constitutional:  []  Fever or chills?  Ear/Nose/Throat:  []  Change in hearing? []  Nose bleeds? []  Sore throat?  Musculoskeletal:  []  Back pain? []  Joint pain? []  Muscle pain?    Physical Examination  Vitals:   01/17/17 0415 01/17/17 0539  BP: (!) 95/56 127/78  Pulse: 74 81  Resp:    Temp:    SpO2: 94% 94%   Body mass index is 43.24 kg/m.  General:  WDWN in NAD HENT: WNL, normocephalic Pulmonary: normal non-labored breathing, without Rales, rhonchi,  wheezing Cardiac: palpable left radial pulse Extremities: wound in  left upper arm without erythema, fistula is in tact Musculoskeletal: no muscle wasting or atrophy  Neurologic: A&O X 3; Appropriate Affect ; SENSATION: normal; MOTOR FUNCTION:  moving all extremities equally. Speech is fluent/normal   CBC    Component Value Date/Time   WBC 5.5 01/07/2017 0152   RBC 2.71 (L) 01/07/2017 0152   HGB 9.1 (L) 01/07/2017 0152   HGB 11.5 (L) 11/12/2016 1341   HCT 27.6 (L) 01/07/2017 0152   HCT 34.9 (L) 11/12/2016 1341   PLT 191 01/07/2017 0152   PLT 202 11/12/2016 1341   MCV 101.8 (H) 01/07/2017 0152   MCV 96 11/12/2016 1341   MCH 33.6 01/07/2017 0152   MCHC 33.0 01/07/2017 0152   RDW 15.5 01/07/2017 0152   RDW 15.0 11/12/2016 1341   LYMPHSABS 1.9 01/07/2017 0152   LYMPHSABS 2.1 11/12/2016 1341   MONOABS 0.4 01/07/2017 0152   EOSABS 0.2 01/07/2017 0152   EOSABS 0.2 11/12/2016 1341   BASOSABS 0.0 01/07/2017 0152   BASOSABS 0.0 11/12/2016 1341    BMET    Component Value Date/Time   NA 140 01/07/2017 0152   NA 145 (H) 11/12/2016 1341   K 3.6 01/07/2017 0152   CL 96 (L) 01/07/2017 0152   CO2 30 01/07/2017 0152   GLUCOSE 99 01/07/2017 0152   BUN 46 (H) 01/07/2017 0152   BUN 46 (H) 11/12/2016 1341   CREATININE 9.12 (H) 01/07/2017 0152   CALCIUM 8.8 (L) 01/07/2017 0152   GFRNONAA 5 (L) 01/07/2017 0152   GFRAA 6 (L) 01/07/2017 0152    COAGS: Lab Results  Component Value Date   INR 1.11 01/05/2017   INR 1.04 07/18/2013   INR 2.1 (H) 03/08/2008       ASSESSMENT/PLAN: This is a 66 y.o. male s/p revision of left arm avf. The sutures were removed Friday and now the wound has re-opened. Attempted closure in the ED was unsuccessful. Will take to OR for definitive closure and to evaluate the avf. Keep NPO.     Brandon C. Donzetta Matters, MD [facility] Office: [phone] Pager: [phone]

## 2017-01-17 NOTE — Anesthesia Procedure Notes (Signed)
Procedure Name: MAC Date/Time: 01/17/2017 9:51 AM Performed by: Colin Benton, CRNA Pre-anesthesia Checklist: Patient identified, Emergency Drugs available, Suction available and Patient being monitored Patient Re-evaluated:Patient Re-evaluated prior to induction Oxygen Delivery Method: Simple face mask Induction Type: IV induction Placement Confirmation: positive ETCO2

## 2017-01-17 NOTE — ED Triage Notes (Addendum)
Had stiches removed from AV Fistula in left upper arm on Friday.  Incision noted to be opened up about an inch.  No bleeding noted.  Dressing in place.  Stiches were placed after it ripped open during dialysis.

## 2017-01-17 NOTE — Progress Notes (Signed)
Dialysis paperwork faxed to [facility]- transmission confirmed

## 2017-01-17 NOTE — ED Notes (Signed)
ED Provider at bedside. 

## 2017-01-17 NOTE — ED Provider Notes (Signed)
[facility] EMERGENCY DEPARTMENT Provider Note   CSN: [id number] Arrival date & time: 01/17/17  0124     History   Chief Complaint Chief Complaint  Patient presents with  . Post-op Problem    HPI Richard Bush is a 66 y.o. male.  The history is provided by the patient and medical records.    66 year old male with history of anemia pain, GERD, gout, hyperlipidemia, hypertension, neuropathy, end-stage renal disease on hemodialysis, presented to the ED with issues with his dialysis fistula.  Patient reports he had this sutured up a few weeks ago due to recurrent bleeding.  States sutures were removed 2 days ago in the office and was doing well.  Had full dialysis session on Friday, accessed above new wound.  States site opened up again tonight.  Wife called on-call vascular surgeon who recommended to come here.  Patient denies any new numbness or weakness of his arm.  He has not had any bleeding from the fistula itself.  Past Medical History:  Diagnosis Date  . Anemia   . Chronic right hip pain   . ESRD (end stage renal disease) on dialysis [facility])    dialysis M/W/F- [facility] (01/06/2017)  . GERD (gastroesophageal reflux disease)   . Gout   . High cholesterol   . Hypertension   . Neuropathy   . Osteoarthritis    "all over" (01/06/2017)  . Pleurisy   . Pneumonia 1980s X 1    Patient Active Problem List   Diagnosis Date Noted  . Orthostatic dizziness   . Bleeding pseudoaneurysm of left brachiocephalic AV fistula, initial encounter ([place]) 01/05/2017  . Pressure injury of skin 08/04/2016  . Secondary renal hyperparathyroidism ([place]) 08/04/2016  . Anemia secondary to renal failure 08/04/2016  . Pulmonary nodule 08/03/2016  . ESRD on dialysis ([place]) 08/03/2016  . Right bundle branch block 08/03/2016  . History of gastroesophageal reflux (GERD) 07/13/2016  . Idiopathic chronic gout without tophus 07/03/2016  . Primary osteoarthritis of left knee 07/03/2016   . Stage 4 chronic kidney disease ([place]) 07/03/2016  . Osteoarthritis of hand 07/03/2016  . Hypertension 07/03/2016  . Secondary hyperparathyroidism ([place]) 07/03/2016  . Primary osteoarthritis of both feet 07/03/2016  . History of total hip replacement, right 07/03/2016  . Hx of total knee replacement, bilateral 07/03/2016    Past Surgical History:  Procedure Laterality Date  . A/V FISTULAGRAM Left 12/24/2016   Procedure: A/V FISTULAGRAM;  Surgeon: Waynetta Sandy, MD;  Location: [facility] CV LAB;  Service: Cardiovascular;  Laterality: Left;  . APPENDECTOMY  1963  . AV FISTULA PLACEMENT Left 08/04/2016   Procedure: ARTERIOVENOUS (AV) FISTULA CREATION/LEFT ARM;  Surgeon: Serafina Mitchell, MD;  Location: [facility] OR;  Service: Vascular;  Laterality: Left;  . AV FISTULA REPAIR Left 01/06/2017   arm  . BILATERAL CARPAL TUNNEL RELEASE Bilateral 2006-2007   right-left  . CHOLECYSTECTOMY OPEN  1979  . COLONOSCOPY    . FINGER SURGERY Right 2006   "thumb; took out bone that conects to wrist and a piece of tendon; weaved tendon back in for mobility"  . FISTULA SUPERFICIALIZATION Left 09/24/2016   Procedure: FISTULA SUPERFICIALIZATION- LEFT ARM;  Surgeon: Serafina Mitchell, MD;  Location: [facility];  Service: Vascular;  Laterality: Left;  . HAMMER TOE SURGERY Left 1981  . INSERTION OF DIALYSIS CATHETER Left 08/04/2016   Procedure: INSERTION OF DIALYSIS CATHETER;  Surgeon: Serafina Mitchell, MD;  Location: [facility];  Service: Vascular;  Laterality: Left;  .  JOINT REPLACEMENT    . LUMBAR LAMINECTOMY/DECOMPRESSION MICRODISCECTOMY N/A 08/18/2012   Procedure: LUMBAR LAMINECTOMY/DECOMPRESSION MICRODISCECTOMY 3 LEVELS;  Surgeon: Hosie Spangle, MD;  Location: [facility] NEUR[facility] OR[facility];  Service: Neurosurgery;  Laterality: N/A;  Lumbar Two through Five Laminectomy  . REVISON OF ARTERIOVENOUS FISTULA Left 01/06/2017   Procedure: REVISON OF ARTERIOVENOUS FISTULA LEFT ARM;  Surgeon: Serafina Mitchell, MD;  Location: [facility];   Service: Vascular;  Laterality: Left;  . SHOULDER OPEN ROTATOR CUFF REPAIR Right 2001  . TOTAL HIP ARTHROPLASTY Right 07/26/2013   Procedure: TOTAL HIP ARTHROPLASTY;  Surgeon: Kerin Salen, MD;  Location: [place];  Service: Orthopedics;  Laterality: Right;  . TOTAL KNEE ARTHROPLASTY Bilateral 2008-2010   right-left  . WRIST TENDON TRANSFER Bilateral 1972-1973   left-right       Home Medications    Prior to Admission medications   Medication Sig Start Date End Date Taking? Authorizing Provider  acetaminophen (TYLENOL) 650 MG CR tablet Take 650 mg by mouth every 8 (eight) hours as needed for pain.   Yes [provider]  calcium acetate (PHOSLO) 667 MG capsule Take 1 capsule (667 mg total) by mouth 3 (three) times daily with meals. 08/08/16  Yes Ledell Noss, MD  cetirizine (ZYRTEC) 10 MG tablet Take 10 mg by mouth at bedtime.   Yes [provider]  colchicine 0.6 MG tablet Take 0.5 tablets (0.3 mg total) by mouth daily as needed (for gout flareups). 09/24/16 01/17/22 Yes Rhyne, Hulen Shouts, PA-C  febuxostat (ULORIC) 40 MG tablet Take 1 tablet (40 mg total) by mouth daily. Patient taking differently: Take 40 mg by mouth every evening.  10/06/16 04/04/17 Yes Deveshwar, Abel Presto, MD  ferric citrate (AURYXIA) 1 GM 210 MG(Fe) tablet Take 210 mg by mouth 3 (three) times daily with meals.    Yes [provider]  folic acid-vitamin b complex-vitamin c-selenium-zinc (DIALYVITE) 3 MG TABS tablet Take 1 tablet by mouth every evening.    Yes [provider]  gabapentin (NEURONTIN) 300 MG capsule Take 300 mg by mouth at bedtime.    Yes [provider]  hydrALAZINE (APRESOLINE) 50 MG tablet Take 50 mg by mouth 3 (three) times daily. Only if BP above 150   Yes [provider]  ibuprofen (ADVIL,MOTRIN) 200 MG tablet Take 400 mg by mouth every 6 (six) hours as needed for mild pain.    Yes [provider]  midodrine (PROAMATINE) 10 MG tablet Take 10 mg by  mouth every Monday, Wednesday, and Friday. 30 minutes before dialysis   Yes [provider]  ranitidine (ZANTAC) 150 MG tablet Take 150 mg by mouth every morning.    Yes [provider]  simvastatin (ZOCOR) 20 MG tablet Take 20 mg by mouth every evening.    Yes [provider]  oxyCODONE-acetaminophen (ROXICET) 5-325 MG tablet Take 1 tablet by mouth every 6 (six) hours as needed for severe pain. Patient not taking: Reported on 01/15/2017 09/24/16   Gabriel Earing, PA-C    Family History Family History  Problem Relation Age of Onset  . Heart disease Mother   . Cancer Father     Social History Social History   Tobacco Use  . Smoking status: Former Smoker    Packs/day: 1.00    Years: 10.00    Pack years: 10.00    Types: Cigarettes    Last attempt to quit: 08/13/1982    Years since quitting: 34.4  . Smokeless tobacco: Former Systems developer  Types: Chew  Substance Use Topics  . Alcohol use: Yes    Comment: 01/06/2017 "used to be a social drinker; don't drink at all since ~ 2000"  . Drug use: No     Allergies   Cozaar [losartan potassium]; Kayexalate [polystyrene]; Altace [ramipril]; Other; Cholestyramine light; and Sulfa antibiotics   Review of Systems Review of Systems  Skin: Positive for wound.  All other systems reviewed and are negative.    Physical Exam Updated Vital Signs BP (!) 114/58 (BP Location: Right Arm)   Pulse 81   Temp 98.1 F (36.7 C) (Oral)   Resp 18   Ht 5\' 11"  (1.803 m)   Wt (!) 140.6 kg (310 lb)   SpO2 95%   BMI 43.24 kg/m   Physical Exam  Constitutional: He is oriented to person, place, and time. He appears well-developed and well-nourished.  HENT:  Head: Normocephalic and atraumatic.  Mouth/Throat: Oropharynx is clear and moist.  Eyes: Conjunctivae and EOM are normal. Pupils are equal, round, and reactive to light.  Neck: Normal range of motion.  Cardiovascular: Normal rate, regular rhythm and normal heart sounds.    Pulmonary/Chest: Effort normal and breath sounds normal. No stridor. No respiratory distress.  Abdominal: Soft. Bowel sounds are normal. There is no tenderness. There is no rebound.  Musculoskeletal: Normal range of motion.  Left upper arm with dialysis fistula in place, strong thrill noted, there is a 1 inch open area along the mid fistula, skin is open but deep vessels visible, there is no active bleeding, no drainage, no surrounding signs of cellulitis; there is scabbed over access site proximal to the wound  Neurological: He is alert and oriented to person, place, and time.  Skin: Skin is warm and dry.  Psychiatric: He has a normal mood and affect.  Nursing note and vitals reviewed.       ED Treatments / Results  Labs (all labs ordered are listed, but only abnormal results are displayed) Labs Reviewed - No data to display  EKG  EKG Interpretation None       Radiology No results found.  Procedures Procedures (including critical care time)  Medications Ordered in ED Medications - No data to display   Initial Impression / Assessment and Plan / ED Course  I have reviewed the triage vital signs and the nursing notes.  Pertinent labs & imaging results that were available during my care of the patient were reviewed by me and considered in my medical decision making (see chart for details).  66 year old male here with postop complication.  Had his fistula repaired about 2 weeks ago, sutures removed about 2 days ago.  Opened up again last night.  On exam has about a 1 inch area where skin is open above the fistula.  Continues to have strong thrill of the fistula.  There is no active bleeding.  No significant signs of infection or cellulitis.  Skin above appears very fragile, but wound does need to be closed.  Will discuss with vascular surgery.  2:59 AM Discussed with on call vascular surgery, Dr. Donzetta Matters-- can be closed here if patient will allow since only SQ layer involved.   If patient refuses or any complications call him back.  4:05 AM Have attempted repair, however skin is extremely fragile and continues ripping when sutures are tied.  I was able to place a deep suture to try to relieve tension, however still not able to approximate tissue all the way without sutures ripping.  Deep suture removed as likely will need vascular to repair again.  Dr. Donzetta Matters repaged.  Dr. Donzetta Matters to take to the OR this morning.  Will give IV abx prior to procedure in OR.  Patient and wife updated.  Final Clinical Impressions(s) / ED Diagnoses   Final diagnoses:  Wound dehiscence    ED Discharge Orders    None       Larene Pickett, PA-C 01/17/17 0600    Duffy Bruce, MD 01/17/17 [phone]

## 2017-01-17 NOTE — Anesthesia Postprocedure Evaluation (Signed)
Anesthesia Post Note  Patient: Richard Bush  Procedure(s) Performed: REVISION OF ARTERIOVENTOUS (AV) FISTULA LEFT ARM (Left Arm Upper)     Patient location during evaluation: PACU Anesthesia Type: MAC Level of consciousness: awake and alert Pain management: pain level controlled Vital Signs Assessment: post-procedure vital signs reviewed and stable Respiratory status: spontaneous breathing, nonlabored ventilation, respiratory function stable and patient connected to nasal cannula oxygen Cardiovascular status: stable and blood pressure returned to baseline Postop Assessment: no apparent nausea or vomiting Anesthetic complications: no Comments: No antiemetics given due to MAC procedure, and no patient complaint of nausea/vomiting.     Last Vitals:  Vitals:   01/17/17 1038 01/17/17 1049  BP: (!) 109/58 119/64  Pulse: 71 68  Resp: 15 11  Temp:    SpO2: 99% 97%    Last Pain:  Vitals:   01/17/17 0205  TempSrc:   PainSc: 0-No pain                 Catalina Gravel

## 2017-01-17 NOTE — Op Note (Signed)
    Patient name: Richard Bush MRN: [id number] DOB: January 29, 1951 Sex: male  01/17/2017 Pre-operative Diagnosis: post surgical wound left arm Post-operative diagnosis:  Same Surgeon:  Erlene Quan C. Donzetta Matters, MD Assistant: OR nurse Procedure Performed: Primary repair of left arm av fistula and wound closure   Indications: 66 year old male with a recent history of revision of his left arm AV fistula.  Sutures were removed from the wound in the office on Friday and he subsequent leg dehisced the wound last night.  He is now indicated for revision of the fistula with wound closure.  Findings: There was one area where the fistula was very friable this was primarily repaired.  The tissue itself also appeared unhealthy after flaps were raised we were able to close it in 2 layers.  If patient has further issue with this fistula needs consideration of dealing with that issue followed by placement of a catheter to allow the left arm to rest.   Procedure:  The patient was identified in the holding area and taken to the OR where he was placed supine on the table and MAC was induced. He was sterilely prepped and draped in the left arm and given 3 grams of Ancef. We began with local 1% lidocaine of the area.  We then began to dissect the underlying tissue off of the fistula itself.  As we moved distally on the arm there was an area densely adherent this caused a tear in the fistula which was quickly repaired with a running 5-0 Prolene suture while pressure was held above and below.  We then extended the previous incision a centimeter in each direction.  We undermined even further irrigated the wound copiously and then closed with interrupted 3-0 Vicryl suture followed by interrupted 3-0 nylon mattress sutures at the level of the skin.  A dry dressing was placed.  At completion there was a strong thrill in the fistula and the radial pulse was palpable at the wrist.  Patient tolerated procedure without immediate complication.   All counts were correct at completion.  EBL 50 cc.    Daine Croker C. Donzetta Matters, MD [facility] [place] [facility] Office: [phone] Pager: [phone]

## 2017-01-17 NOTE — Transfer of Care (Signed)
Immediate Anesthesia Transfer of Care Note  Patient: Richard Bush  Procedure(s) Performed: REVISION OF ARTERIOVENTOUS (AV) FISTULA LEFT ARM (Left Arm Upper)  Patient Location: PACU  Anesthesia Type:MAC  Level of Consciousness: awake, alert , oriented and patient cooperative  Airway & Oxygen Therapy: Patient Spontanous Breathing and Patient connected to face mask oxygen  Post-op Assessment: Report given to RN, Post -op Vital signs reviewed and stable and Patient moving all extremities X 4  Post vital signs: Reviewed and stable  Last Vitals:  Vitals:   01/17/17 0539 01/17/17 0831  BP: 127/78 (!) 101/57  Pulse: 81 72  Resp:  18  Temp:    SpO2: 94% 95%    Last Pain:  Vitals:   01/17/17 0205  TempSrc:   PainSc: 0-No pain         Complications: No apparent anesthesia complications

## 2017-01-18 ENCOUNTER — Encounter (HOSPITAL_COMMUNITY): Payer: Self-pay | Admitting: Vascular Surgery

## 2017-01-20 ENCOUNTER — Encounter (HOSPITAL_COMMUNITY): Payer: Self-pay | Admitting: Emergency Medicine

## 2017-01-20 ENCOUNTER — Other Ambulatory Visit: Payer: Self-pay

## 2017-01-20 ENCOUNTER — Emergency Department (HOSPITAL_COMMUNITY)
Admission: EM | Admit: 2017-01-20 | Discharge: 2017-01-20 | Disposition: A | Discharge status: Home / Self Care | Payer: Medicare Other | Attending: Emergency Medicine | Admitting: Emergency Medicine

## 2017-01-20 DIAGNOSIS — T8131XA Disruption of external operation (surgical) wound, not elsewhere classified, initial encounter: Secondary | ICD-10-CM | POA: Insufficient documentation

## 2017-01-20 DIAGNOSIS — Y829 Unspecified medical devices associated with adverse incidents: Secondary | ICD-10-CM | POA: Diagnosis not present

## 2017-01-20 DIAGNOSIS — N186 End stage renal disease: Secondary | ICD-10-CM | POA: Insufficient documentation

## 2017-01-20 DIAGNOSIS — Z87891 Personal history of nicotine dependence: Secondary | ICD-10-CM | POA: Diagnosis not present

## 2017-01-20 DIAGNOSIS — T82898A Other specified complication of vascular prosthetic devices, implants and grafts, initial encounter: Secondary | ICD-10-CM | POA: Diagnosis present

## 2017-01-20 DIAGNOSIS — T8130XA Disruption of wound, unspecified, initial encounter: Secondary | ICD-10-CM

## 2017-01-20 DIAGNOSIS — I120 Hypertensive chronic kidney disease with stage 5 chronic kidney disease or end stage renal disease: Secondary | ICD-10-CM | POA: Insufficient documentation

## 2017-01-20 DIAGNOSIS — Z992 Dependence on renal dialysis: Secondary | ICD-10-CM | POA: Insufficient documentation

## 2017-01-20 DIAGNOSIS — Z79899 Other long term (current) drug therapy: Secondary | ICD-10-CM | POA: Insufficient documentation

## 2017-01-20 MED ORDER — CEPHALEXIN 500 MG PO CAPS: 500.0000 mg | ORAL_CAPSULE | Freq: Four times a day (QID) | ORAL | 0 refills | Status: DC

## 2017-01-20 NOTE — ED Notes (Signed)
Left arm is bandaged at this time. Unable to assess bleeding. Bleeding is controlled due to bandage.

## 2017-01-20 NOTE — Discharge Instructions (Signed)
Medications: Keflex  Treatment: Take Keflex until completed.  Keep the dressing applied as directed.    Follow-up: Please follow-up with Dr. Stephens [facility] office.  They will call you for an appointment on Monday.  If they do not call by Friday, please call them to schedule an appointment.  Please return to emergency department if you develop any new or worsening symptoms.

## 2017-01-20 NOTE — ED Triage Notes (Signed)
Pt to ER for evaluation of left arm fistula wound dehiscence. Went to the OR Sunday and had the wound stitched up. States was in the shower this morning and the wound dehisced. Weeping present. Bleeding controlled.

## 2017-01-20 NOTE — Consult Note (Signed)
[facility]  Patient name: Richard Bush MRN: [id number] DOB: 03-12-1950 Sex: male   REASON FOR VISIT:   Wound problem  HISOTRY OF PRESENT ILLNESS:    Richard Bush is a 66 y.o. male who presents with seperation of skin incision   PAST MEDICAL HISTORY:   Past Medical History:  Diagnosis Date  . Anemia   . Chronic right hip pain   . ESRD (end stage renal disease) on dialysis [facility])    dialysis M/W/F- [facility] (01/06/2017)  . GERD (gastroesophageal reflux disease)   . Gout   . High cholesterol   . Hypertension   . Neuropathy   . Osteoarthritis    "all over" (01/06/2017)  . Pleurisy   . Pneumonia 1980s X 1     FAMILY HISTORY:   Family History  Problem Relation Age of Onset  . Heart disease Mother   . Cancer Father     SOCIAL HISTORY:   Social History   Tobacco Use  . Smoking status: Former Smoker    Packs/day: 1.00    Years: 10.00    Pack years: 10.00    Types: Cigarettes    Last attempt to quit: 08/13/1982    Years since quitting: 34.4  . Smokeless tobacco: Former Systems developer    Types: Chew  Substance Use Topics  . Alcohol use: Yes    Comment: 01/06/2017 "used to be a social drinker; don't drink at all since ~ 2000"     ALLERGIES:   Allergies  Allergen Reactions  . Cozaar [Losartan Potassium] Other (See Comments)    Stopped due to kidney decline 4/18  . Kayexalate [Polystyrene] Other (See Comments)    Abnormal ekg  . Altace [Ramipril] Cough    Chronic cough  . Other Swelling    "Symvisc"   . Cholestyramine Light Other (See Comments)    UNSPECIFIED REACTION d  . Sulfa Antibiotics Rash     CURRENT MEDICATIONS:   No current facility-administered medications for this encounter.    Current Outpatient Medications  Medication Sig Dispense Refill  . acetaminophen (TYLENOL) 650 MG CR tablet Take 650 mg by mouth every 8 (eight) hours as needed for pain.    . calcium acetate  (PHOSLO) 667 MG capsule Take 1 capsule (667 mg total) by mouth 3 (three) times daily with meals. 90 capsule 0  . cephALEXin (KEFLEX) 500 MG capsule Take 1 capsule (500 mg total) by mouth 4 (four) times daily. 20 capsule 0  . cetirizine (ZYRTEC) 10 MG tablet Take 10 mg by mouth at bedtime.    . colchicine 0.6 MG tablet Take 0.5 tablets (0.3 mg total) by mouth daily as needed (for gout flareups).    . febuxostat (ULORIC) 40 MG tablet Take 1 tablet (40 mg total) by mouth daily. (Patient taking differently: Take 40 mg by mouth every evening. ) 90 tablet 1  . ferric citrate (AURYXIA) 1 GM 210 MG(Fe) tablet Take 210 mg by mouth 3 (three) times daily with meals.     . folic acid-vitamin b complex-vitamin c-selenium-zinc (DIALYVITE) 3 MG TABS tablet Take 1 tablet by mouth every evening.     . gabapentin (NEURONTIN) 300 MG capsule Take 300 mg by mouth at bedtime.     . hydrALAZINE (APRESOLINE) 50 MG tablet Take 50 mg by mouth 3 (three) times daily. Only if BP above 150    . ibuprofen (ADVIL,MOTRIN) 200 MG tablet Take 400 mg by mouth every 6 (six) hours as needed  for mild pain.     . midodrine (PROAMATINE) 10 MG tablet Take 10 mg by mouth every Monday, Wednesday, and Friday. 30 minutes before dialysis    . oxyCODONE-acetaminophen (ROXICET) 5-325 MG tablet Take 1 tablet by mouth every 6 (six) hours as needed for severe pain. (Patient not taking: Reported on 01/15/2017) 10 tablet 0  . ranitidine (ZANTAC) 150 MG tablet Take 150 mg by mouth every morning.     . simvastatin (ZOCOR) 20 MG tablet Take 20 mg by mouth every evening.       REVIEW OF SYSTEMS:   [X]  denotes positive finding, [ ]  denotes negative finding Cardiac  Comments:  Chest pain or chest pressure:    Shortness of breath upon exertion:    Short of breath when lying flat:    Irregular heart rhythm:        Vascular    Pain in calf, thigh, or hip brought on by ambulation:    Pain in feet at night that wakes you up from your sleep:     Blood  clot in your veins:    Leg swelling:         Pulmonary    Oxygen at home:    Productive cough:     Wheezing:         Neurologic    Sudden weakness in arms or legs:     Sudden numbness in arms or legs:     Sudden onset of difficulty speaking or slurred speech:    Temporary loss of vision in one eye:     Problems with dizziness:         Gastrointestinal    Blood in stool:     Vomited blood:         Genitourinary    Burning when urinating:     Blood in urine:        Psychiatric    Major depression:         Hematologic    Bleeding problems:    Problems with blood clotting too easily:        Skin    Rashes or ulcers:        Constitutional    Fever or chills:      PHYSICAL EXAM:   Vitals:   01/20/17 1233  BP: (!) 113/59  Pulse: 70  Resp: 18  Temp: 98.1 F (36.7 C)  TempSrc: Oral  SpO2: 94%    GENERAL: The patient is a well-nourished male, in no acute distress. The vital signs are documented above. CARDIAC: There is a regular rate and rhythm.  VASCULAR: Fistula incision has recently separated.  There is surrounding erythema with a palpable thrill within the fistula. SKIN: There are no ulcers or rashes noted. PSYCHIATRIC: The patient has a normal affect.  STUDIES:   None  MEDICAL ISSUES:   This has been a recurrent issue for the patient with separation of the incision.  He recently went to the operating room over the weekend and had the suture closed and the sutures have pulled through the skin.  Today there appears to be some erythema around the skin and I suspect that is why he is having issues with his incision.  At this point, I do not think reclosing it is appropriate.  I am going to pack the wound with wet gauze.  I will schedule home health nurse for follow-up.  He will see me in the office on Monday.  He is given antibiotics by  the ER.  He is stable to be discharged home    [name], MD [facility] [phone] Pager [phone]

## 2017-01-20 NOTE — ED Provider Notes (Signed)
[facility] EMERGENCY DEPARTMENT Provider Note   CSN: [id number] Arrival date & time: 01/20/17  1151     History   Chief Complaint Chief Complaint  Patient presents with  . Wound Dehiscence    HPI Richard Bush is a 66 y.o. male with history of on dialysis Monday Wednesday Friday who presents with left ESRD fistula wound dehiscence.  Patient has had several revisions over the past several weeks.  Patient had a recent surgery 3 days ago and reports he was in the shower this morning and the wound opened again.  He denies any significant drainage, however has had some mild weeping.  Bleeding is controlled.  He denies any significant pain to the area.  Dr. Donzetta Matters follows and operated on 01/17/2017.  HPI  Past Medical History:  Diagnosis Date  . Anemia   . Chronic right hip pain   . ESRD (end stage renal disease) on dialysis [facility])    dialysis M/W/F- [facility] (01/06/2017)  . GERD (gastroesophageal reflux disease)   . Gout   . High cholesterol   . Hypertension   . Neuropathy   . Osteoarthritis    "all over" (01/06/2017)  . Pleurisy   . Pneumonia 1980s X 1    Patient Active Problem List   Diagnosis Date Noted  . Orthostatic dizziness   . Bleeding pseudoaneurysm of left brachiocephalic AV fistula, initial encounter ([place]) 01/05/2017  . Pressure injury of skin 08/04/2016  . Secondary renal hyperparathyroidism ([place]) 08/04/2016  . Anemia secondary to renal failure 08/04/2016  . Pulmonary nodule 08/03/2016  . ESRD on dialysis ([place]) 08/03/2016  . Right bundle branch block 08/03/2016  . History of gastroesophageal reflux (GERD) 07/13/2016  . Idiopathic chronic gout without tophus 07/03/2016  . Primary osteoarthritis of left knee 07/03/2016  . Stage 4 chronic kidney disease ([place]) 07/03/2016  . Osteoarthritis of hand 07/03/2016  . Hypertension 07/03/2016  . Secondary hyperparathyroidism ([place]) 07/03/2016  . Primary osteoarthritis of both feet 07/03/2016  .  History of total hip replacement, right 07/03/2016  . Hx of total knee replacement, bilateral 07/03/2016    Past Surgical History:  Procedure Laterality Date  . A/V FISTULAGRAM Left 12/24/2016   Procedure: A/V FISTULAGRAM;  Surgeon: Waynetta Sandy, MD;  Location: [place] CV LAB;  Service: Cardiovascular;  Laterality: Left;  . APPENDECTOMY  1963  . AV FISTULA PLACEMENT Left 08/04/2016   Procedure: ARTERIOVENOUS (AV) FISTULA CREATION/LEFT ARM;  Surgeon: Serafina [place], MD;  [facility]: [facility];  Service: Vascular;  Laterality: Left;  . AV FISTULA REPAIR Left 01/06/2017   arm  . BILATERAL CARPAL TUNNEL RELEASE Bilateral 2006-2007   right-left  . CHOLECYSTECTOMY OPEN  1979  . COLONOSCOPY    . FINGER SURGERY Right 2006   "thumb; took out bone that conects to wrist and a piece of tendon; weaved tendon back in for mobility"  . FISTULA SUPERFICIALIZATION Left 09/24/2016   Procedure: FISTULA SUPERFICIALIZATION- LEFT ARM;  Surgeon: Serafina [place], MD;  Location: [place];  Service: Vascular;  Laterality: Left;  . HAMMER TOE SURGERY Left 1981  . INSERTION OF DIALYSIS CATHETER Left 08/04/2016   Procedure: INSERTION OF DIALYSIS CATHETER;  Surgeon: Serafina [place], MD;  Location: [place];  Service: Vascular;  Laterality: Left;  . JOINT REPLACEMENT    . LUMBAR LAMINECTOMY/DECOMPRESSION MICRODISCECTOMY N/A 08/18/2012   Procedure: LUMBAR LAMINECTOMY/DECOMPRESSION MICRODISCECTOMY 3 LEVELS;  Surgeon: Hosie Spangle, MD;  Location: [facility];  Service: Neurosurgery;  Laterality: N/A;  Lumbar Two through Five Laminectomy  . REVISON OF ARTERIOVENOUS FISTULA Left 01/06/2017   Procedure: REVISON OF ARTERIOVENOUS FISTULA LEFT ARM;  Surgeon: Serafina [place], MD;  Location: [place];  Service: Vascular;  Laterality: Left;  . SHOULDER OPEN ROTATOR CUFF REPAIR Right 2001  . THROMBECTOMY AND REVISION OF ARTERIOVENTOUS (AV) GORETEX  GRAFT Left 01/17/2017   Procedure: REVISION OF ARTERIOVENTOUS (AV) FISTULA  LEFT ARM;  Surgeon: Waynetta Sandy, MD;  Location: [place];  Service: Vascular;  Laterality: Left;  . TOTAL HIP ARTHROPLASTY Right 07/26/2013   Procedure: TOTAL HIP ARTHROPLASTY;  Surgeon: Kerin Salen, MD;  Location: [place];  Service: Orthopedics;  Laterality: Right;  . TOTAL KNEE ARTHROPLASTY Bilateral 2008-2010   right-left  . WRIST TENDON TRANSFER Bilateral 1972-1973   left-right       Home Medications    Prior to Admission medications   Medication Sig Start Date End Date Taking? Authorizing Provider  acetaminophen (TYLENOL) 650 MG CR tablet Take 650 mg by mouth every 8 (eight) hours as needed for pain.    [provider]  calcium acetate (PHOSLO) 667 MG capsule Take 1 capsule (667 mg total) by mouth 3 (three) times daily with meals. 08/08/16   Ledell Noss, MD  cephALEXin (KEFLEX) 500 MG capsule Take 1 capsule (500 mg total) by mouth 4 (four) times daily. 01/20/17   Maham Quintin, Bea Graff, PA-C  cetirizine (ZYRTEC) 10 MG tablet Take 10 mg by mouth at bedtime.    [provider]  colchicine 0.6 MG tablet Take 0.5 tablets (0.3 mg total) by mouth daily as needed (for gout flareups). 09/24/16 01/17/22  Rhyne, Hulen Shouts, PA-C  febuxostat (ULORIC) 40 MG tablet Take 1 tablet (40 mg total) by mouth daily. Patient taking differently: Take 40 mg by mouth every evening.  10/06/16 04/04/17  Bo Merino, MD  ferric citrate (AURYXIA) 1 GM 210 MG(Fe) tablet Take 210 mg by mouth 3 (three) times daily with meals.     [provider]  folic acid-vitamin b complex-vitamin c-selenium-zinc (DIALYVITE) 3 MG TABS tablet Take 1 tablet by mouth every evening.     [provider]  gabapentin (NEURONTIN) 300 MG capsule Take 300 mg by mouth at bedtime.     [provider]  hydrALAZINE (APRESOLINE) 50 MG tablet Take 50 mg by mouth 3 (three) times daily. Only if BP above 150    [provider]  ibuprofen (ADVIL,MOTRIN) 200 MG tablet Take 400 mg by mouth every 6  (six) hours as needed for mild pain.     [provider]  midodrine (PROAMATINE) 10 MG tablet Take 10 mg by mouth every Monday, Wednesday, and Friday. 30 minutes before dialysis    [provider]  oxyCODONE-acetaminophen (ROXICET) 5-325 MG tablet Take 1 tablet by mouth every 6 (six) hours as needed for severe pain. Patient not taking: Reported on 01/15/2017 09/24/16   Gabriel Earing, PA-C  ranitidine (ZANTAC) 150 MG tablet Take 150 mg by mouth every morning.     [provider]  simvastatin (ZOCOR) 20 MG tablet Take 20 mg by mouth every evening.     [provider]    Family History Family History  Problem Relation Age of Onset  . Heart disease Mother   . Cancer Father     Social History Social History   Tobacco Use  . Smoking status: Former Smoker    Packs/day: 1.00    Years: 10.00    Pack years: 10.00  Types: Cigarettes    Last attempt to quit: 08/13/1982    Years since quitting: 34.4  . Smokeless tobacco: Former Systems developer    Types: Chew  Substance Use Topics  . Alcohol use: Yes    Comment: 01/06/2017 "used to be a social drinker; don't drink at all since ~ 2000"  . Drug use: No     Allergies   Cozaar [losartan potassium]; Kayexalate [polystyrene]; Altace [ramipril]; Other; Cholestyramine light; and Sulfa antibiotics   Review of Systems Review of Systems  Constitutional: Negative for chills and fever.  HENT: Negative for facial swelling and sore throat.   Respiratory: Negative for shortness of breath.   Cardiovascular: Negative for chest pain.  Gastrointestinal: Negative for abdominal pain, nausea and vomiting.  Genitourinary: Negative for dysuria.  Musculoskeletal: Negative for back pain.  Skin: Positive for wound. Negative for rash.  Neurological: Negative for headaches.  Psychiatric/Behavioral: The patient is not nervous/anxious.      Physical Exam Updated Vital Signs BP (!) 107/52 (BP Location: Right Arm)   Pulse 67    Temp 97.6 F (36.4 C) (Oral)   Resp 18   SpO2 94%   Physical Exam  Constitutional: He appears well-developed and well-nourished. No distress.  HENT:  Head: Normocephalic and atraumatic.  Mouth/Throat: Oropharynx is clear and moist. No oropharyngeal exudate.  Eyes: Conjunctivae are normal. Pupils are equal, round, and reactive to light. Right eye exhibits no discharge. Left eye exhibits no discharge. No scleral icterus.  Neck: Normal range of motion. Neck supple. No thyromegaly present.  Cardiovascular: Normal rate, regular rhythm, normal heart sounds and intact distal pulses. Exam reveals no gallop and no friction rub.  No murmur heard. Pulmonary/Chest: Effort normal and breath sounds normal. No stridor. No respiratory distress. He has no wheezes. He has no rales.  Abdominal: Soft. Bowel sounds are normal. He exhibits no distension. There is no tenderness. There is no rebound and no guarding.  Musculoskeletal: He exhibits no edema.  Lymphadenopathy:    He has no cervical adenopathy.  Neurological: He is alert. Coordination normal.  Skin: Skin is warm and dry. No rash noted. He is not diaphoretic. No pallor.  Dehisced wound to L AV fistula superior to L AC, significant tension with sutures still in place at the wound edges; no significant tenderness  Psychiatric: He has a normal mood and affect.  Nursing note and vitals reviewed.      ED Treatments / Results  Labs (all labs ordered are listed, but only abnormal results are displayed) Labs Reviewed - No data to display  EKG  EKG Interpretation None       Radiology No results found.  Procedures Procedures (including critical care time)  Medications Ordered in ED Medications - No data to display   Initial Impression / Assessment and Plan / ED Course  I have reviewed the triage vital signs and the nursing notes.  Pertinent labs & imaging results that were available during my care of the patient were reviewed by me and  considered in my medical decision making (see chart for details).     Patient with wound dehiscence of left AV fistula surgery.  Mild erythema noted to the wound.  Sutures still in place.  Patient evaluated by Dr. Trula Slade, vascular surgeon, who removed sutures and packed the wound with saline soaked gauze and plans to have the wound heal by secondary intention.  We will cover with Keflex for infection.  Patient to follow-up in Dr. [facility] office.  Return precautions  discussed.  Patient understands and agrees with plan.  Patient vitals stable throughout ED course discharged in satisfactory condition. I discussed patient case with Dr. Laverta Baltimore who guided the patient's management and agrees with plan.   Final Clinical Impressions(s) / ED Diagnoses   Final diagnoses:  Wound dehiscence    ED Discharge Orders        Ordered    cephALEXin (KEFLEX) 500 MG capsule  4 times daily     01/20/17 [address] [place] 01/20/17 1620    Margette Fast, MD 01/20/17 2019

## 2017-01-21 ENCOUNTER — Other Ambulatory Visit: Payer: Self-pay

## 2017-01-21 ENCOUNTER — Observation Stay (HOSPITAL_COMMUNITY)
Admission: EM | Admit: 2017-01-21 | Discharge: 2017-01-22 | Disposition: A | Discharge status: Home / Self Care | Payer: Medicare Other | Attending: Vascular Surgery | Admitting: Vascular Surgery

## 2017-01-21 ENCOUNTER — Encounter (HOSPITAL_COMMUNITY)
Admission: EM | Disposition: A | Discharge status: Home / Self Care | Payer: Self-pay | Source: Home / Self Care | Attending: Emergency Medicine

## 2017-01-21 ENCOUNTER — Emergency Department (HOSPITAL_COMMUNITY): Payer: Medicare Other | Admitting: Certified Registered Nurse Anesthetist

## 2017-01-21 ENCOUNTER — Encounter (HOSPITAL_COMMUNITY): Payer: Self-pay

## 2017-01-21 DIAGNOSIS — Z888 Allergy status to other drugs, medicaments and biological substances status: Secondary | ICD-10-CM | POA: Insufficient documentation

## 2017-01-21 DIAGNOSIS — Z96641 Presence of right artificial hip joint: Secondary | ICD-10-CM | POA: Diagnosis not present

## 2017-01-21 DIAGNOSIS — N186 End stage renal disease: Secondary | ICD-10-CM | POA: Diagnosis present

## 2017-01-21 DIAGNOSIS — Z96653 Presence of artificial knee joint, bilateral: Secondary | ICD-10-CM | POA: Insufficient documentation

## 2017-01-21 DIAGNOSIS — E7800 Pure hypercholesterolemia, unspecified: Secondary | ICD-10-CM | POA: Diagnosis not present

## 2017-01-21 DIAGNOSIS — G629 Polyneuropathy, unspecified: Secondary | ICD-10-CM | POA: Diagnosis not present

## 2017-01-21 DIAGNOSIS — M19071 Primary osteoarthritis, right ankle and foot: Secondary | ICD-10-CM | POA: Diagnosis not present

## 2017-01-21 DIAGNOSIS — M19072 Primary osteoarthritis, left ankle and foot: Secondary | ICD-10-CM | POA: Diagnosis not present

## 2017-01-21 DIAGNOSIS — T82898A Other specified complication of vascular prosthetic devices, implants and grafts, initial encounter: Secondary | ICD-10-CM | POA: Diagnosis not present

## 2017-01-21 DIAGNOSIS — I4510 Unspecified right bundle-branch block: Secondary | ICD-10-CM | POA: Insufficient documentation

## 2017-01-21 DIAGNOSIS — Z9049 Acquired absence of other specified parts of digestive tract: Secondary | ICD-10-CM | POA: Insufficient documentation

## 2017-01-21 DIAGNOSIS — T8131XD Disruption of external operation (surgical) wound, not elsewhere classified, subsequent encounter: Secondary | ICD-10-CM | POA: Diagnosis not present

## 2017-01-21 DIAGNOSIS — Z882 Allergy status to sulfonamides status: Secondary | ICD-10-CM | POA: Diagnosis not present

## 2017-01-21 DIAGNOSIS — N2581 Secondary hyperparathyroidism of renal origin: Secondary | ICD-10-CM | POA: Diagnosis not present

## 2017-01-21 DIAGNOSIS — Z8249 Family history of ischemic heart disease and other diseases of the circulatory system: Secondary | ICD-10-CM | POA: Insufficient documentation

## 2017-01-21 DIAGNOSIS — Z87891 Personal history of nicotine dependence: Secondary | ICD-10-CM | POA: Diagnosis not present

## 2017-01-21 DIAGNOSIS — Z992 Dependence on renal dialysis: Secondary | ICD-10-CM | POA: Diagnosis not present

## 2017-01-21 DIAGNOSIS — M19049 Primary osteoarthritis, unspecified hand: Secondary | ICD-10-CM | POA: Diagnosis not present

## 2017-01-21 DIAGNOSIS — Z9889 Other specified postprocedural states: Secondary | ICD-10-CM | POA: Insufficient documentation

## 2017-01-21 DIAGNOSIS — I120 Hypertensive chronic kidney disease with stage 5 chronic kidney disease or end stage renal disease: Secondary | ICD-10-CM | POA: Diagnosis not present

## 2017-01-21 DIAGNOSIS — M109 Gout, unspecified: Secondary | ICD-10-CM | POA: Insufficient documentation

## 2017-01-21 DIAGNOSIS — Y832 Surgical operation with anastomosis, bypass or graft as the cause of abnormal reaction of the patient, or of later complication, without mention of misadventure at the time of the procedure: Secondary | ICD-10-CM | POA: Diagnosis not present

## 2017-01-21 DIAGNOSIS — Z79899 Other long term (current) drug therapy: Secondary | ICD-10-CM | POA: Diagnosis not present

## 2017-01-21 DIAGNOSIS — D631 Anemia in chronic kidney disease: Secondary | ICD-10-CM | POA: Insufficient documentation

## 2017-01-21 DIAGNOSIS — G8929 Other chronic pain: Secondary | ICD-10-CM | POA: Insufficient documentation

## 2017-01-21 DIAGNOSIS — T8130XA Disruption of wound, unspecified, initial encounter: Secondary | ICD-10-CM

## 2017-01-21 DIAGNOSIS — K219 Gastro-esophageal reflux disease without esophagitis: Secondary | ICD-10-CM | POA: Diagnosis not present

## 2017-01-21 HISTORY — PX: LIGATION OF ARTERIOVENOUS  FISTULA: SHX5948

## 2017-01-21 LAB — BASIC METABOLIC PANEL
Anion gap: 17 — ABNORMAL HIGH (ref 5–15)
BUN: 53 mg/dL — AB (ref 6–20)
CHLORIDE: 94 mmol/L — AB (ref 101–111)
CO2: 29 mmol/L (ref 22–32)
CREATININE: 8.09 mg/dL — AB (ref 0.61–1.24)
Calcium: 9.1 mg/dL (ref 8.9–10.3)
GFR calc Af Amer: 7 mL/min — ABNORMAL LOW (ref >60)
GFR calc non Af Amer: 6 mL/min — ABNORMAL LOW (ref >60)
GLUCOSE: 90 mg/dL (ref 65–99)
Potassium: 4.3 mmol/L (ref 3.5–5.1)
Sodium: 140 mmol/L (ref 135–145)

## 2017-01-21 LAB — CBC
HCT: 32.3 % — ABNORMAL LOW (ref 39.0–52.0)
HEMOGLOBIN: 10.3 g/dL — AB (ref 13.0–17.0)
MCH: 33.2 pg (ref 26.0–34.0)
MCHC: 31.9 g/dL (ref 30.0–36.0)
MCV: 104.2 fL — ABNORMAL HIGH (ref 78.0–100.0)
Platelets: 246 10*3/uL (ref 150–400)
RBC: 3.1 MIL/uL — ABNORMAL LOW (ref 4.22–5.81)
RDW: 15 % (ref 11.5–15.5)
WBC: 6 10*3/uL (ref 4.0–10.5)

## 2017-01-21 SURGERY — LIGATION OF ARTERIOVENOUS  FISTULA
Anesthesia: Monitor Anesthesia Care | Site: Arm Upper | Laterality: Left

## 2017-01-21 MED ORDER — ONDANSETRON HCL 4 MG/2ML IJ SOLN
INTRAMUSCULAR | Status: DC | PRN
  Administered 2017-01-21: 4 mg via INTRAVENOUS

## 2017-01-21 MED ORDER — HYDRALAZINE HCL 50 MG PO TABS: 50.0000 mg | ORAL_TABLET | Freq: Three times a day (TID) | ORAL | Status: DC

## 2017-01-21 MED ORDER — ALUM & MAG HYDROXIDE-SIMETH 200-200-20 MG/5ML PO SUSP: 15.0000 mL | ORAL | Status: DC | PRN

## 2017-01-21 MED ORDER — PROTAMINE SULFATE 10 MG/ML IV SOLN
INTRAVENOUS | Status: DC | PRN
  Administered 2017-01-21: 50 mg via INTRAVENOUS

## 2017-01-21 MED ORDER — PROPOFOL 500 MG/50ML IV EMUL
INTRAVENOUS | Status: DC | PRN
  Administered 2017-01-21: 75 ug/kg/min via INTRAVENOUS

## 2017-01-21 MED ORDER — MIDODRINE HCL 5 MG PO TABS: 10.0000 mg | ORAL_TABLET | ORAL | Status: DC

## 2017-01-21 MED ORDER — FENTANYL CITRATE (PF) 100 MCG/2ML IJ SOLN: 25.0000 ug | INTRAMUSCULAR | Status: DC | PRN

## 2017-01-21 MED ORDER — HEPARIN SODIUM (PORCINE) 1000 UNIT/ML IJ SOLN
INTRAMUSCULAR | Status: AC
  Filled 2017-01-21: qty 1

## 2017-01-21 MED ORDER — OXYCODONE-ACETAMINOPHEN 5-325 MG PO TABS: 1.0000 | ORAL_TABLET | Freq: Four times a day (QID) | ORAL | Status: DC | PRN

## 2017-01-21 MED ORDER — LIDOCAINE-EPINEPHRINE (PF) 1 %-1:200000 IJ SOLN
INTRAMUSCULAR | Status: DC | PRN
  Administered 2017-01-21: 30 mL

## 2017-01-21 MED ORDER — MORPHINE SULFATE (PF) 4 MG/ML IV SOLN: 1.0000 mg | INTRAVENOUS | Status: DC | PRN

## 2017-01-21 MED ORDER — FAMOTIDINE 10 MG PO TABS
10.0000 mg | ORAL_TABLET | Freq: Two times a day (BID) | ORAL | Status: DC
  Administered 2017-01-21: 10 mg via ORAL
  Filled 2017-01-21: qty 1

## 2017-01-21 MED ORDER — CALCIUM ACETATE (PHOS BINDER) 667 MG PO CAPS
667.0000 mg | ORAL_CAPSULE | Freq: Three times a day (TID) | ORAL | Status: DC
  Filled 2017-01-21 (×2): qty 1

## 2017-01-21 MED ORDER — SODIUM CHLORIDE 0.9 % IV SOLN
INTRAVENOUS | Status: DC
  Administered 2017-01-21: 19:00:00 via INTRAVENOUS

## 2017-01-21 MED ORDER — ONDANSETRON HCL 4 MG/2ML IJ SOLN
4.0000 mg | Freq: Four times a day (QID) | INTRAMUSCULAR | Status: DC | PRN
Start: 2017-01-21 — End: 2017-01-22

## 2017-01-21 MED ORDER — FERRIC CITRATE 1 GM 210 MG(FE) PO TABS
210.0000 mg | ORAL_TABLET | Freq: Three times a day (TID) | ORAL | Status: DC
  Filled 2017-01-21 (×2): qty 1

## 2017-01-21 MED ORDER — PANTOPRAZOLE SODIUM 40 MG PO TBEC: 40.0000 mg | DELAYED_RELEASE_TABLET | Freq: Every day | ORAL | Status: DC

## 2017-01-21 MED ORDER — 0.9 % SODIUM CHLORIDE (POUR BTL) OPTIME
TOPICAL | Status: DC | PRN
  Administered 2017-01-21: 1000 mL

## 2017-01-21 MED ORDER — LIDOCAINE 2% (20 MG/ML) 5 ML SYRINGE
INTRAMUSCULAR | Status: AC
  Filled 2017-01-21: qty 5

## 2017-01-21 MED ORDER — GUAIFENESIN-DM 100-10 MG/5ML PO SYRP: 15.0000 mL | ORAL_SOLUTION | ORAL | Status: DC | PRN

## 2017-01-21 MED ORDER — HEPARIN SODIUM (PORCINE) 5000 UNIT/ML IJ SOLN
INTRAMUSCULAR | Status: DC | PRN
  Administered 2017-01-21: 20:00:00

## 2017-01-21 MED ORDER — GABAPENTIN 300 MG PO CAPS
300.0000 mg | ORAL_CAPSULE | Freq: Every day | ORAL | Status: DC
  Administered 2017-01-21: 300 mg via ORAL
  Filled 2017-01-21: qty 1

## 2017-01-21 MED ORDER — OXYCODONE HCL 5 MG PO TABS: 5.0000 mg | ORAL_TABLET | ORAL | Status: DC | PRN

## 2017-01-21 MED ORDER — LABETALOL HCL 5 MG/ML IV SOLN
10.0000 mg | INTRAVENOUS | Status: DC | PRN
  Filled 2017-01-21: qty 4

## 2017-01-21 MED ORDER — PROTAMINE SULFATE 10 MG/ML IV SOLN
INTRAVENOUS | Status: AC
  Filled 2017-01-21: qty 5

## 2017-01-21 MED ORDER — ONDANSETRON HCL 4 MG/2ML IJ SOLN
INTRAMUSCULAR | Status: AC
  Filled 2017-01-21: qty 2

## 2017-01-21 MED ORDER — POTASSIUM CHLORIDE CRYS ER 20 MEQ PO TBCR: 20.0000 meq | EXTENDED_RELEASE_TABLET | Freq: Once | ORAL | Status: DC

## 2017-01-21 MED ORDER — HEPARIN SODIUM (PORCINE) 1000 UNIT/ML IJ SOLN
INTRAMUSCULAR | Status: DC | PRN
  Administered 2017-01-21: 10000 [IU] via INTRAVENOUS

## 2017-01-21 MED ORDER — ONDANSETRON HCL 4 MG/2ML IJ SOLN: 4.0000 mg | Freq: Once | INTRAMUSCULAR | Status: DC | PRN

## 2017-01-21 MED ORDER — HYDRALAZINE HCL 20 MG/ML IJ SOLN: 5.0000 mg | INTRAMUSCULAR | Status: DC | PRN

## 2017-01-21 MED ORDER — CEFAZOLIN SODIUM-DEXTROSE 2-4 GM/100ML-% IV SOLN
INTRAVENOUS | Status: AC
  Filled 2017-01-21: qty 100

## 2017-01-21 MED ORDER — SODIUM CHLORIDE 0.9 % IV SOLN
250.0000 mL | INTRAVENOUS | Status: DC | PRN
Start: 2017-01-21 — End: 2017-01-22

## 2017-01-21 MED ORDER — FENTANYL CITRATE (PF) 250 MCG/5ML IJ SOLN
INTRAMUSCULAR | Status: AC
  Filled 2017-01-21: qty 5

## 2017-01-21 MED ORDER — SIMVASTATIN 20 MG PO TABS
20.0000 mg | ORAL_TABLET | Freq: Every evening | ORAL | Status: DC
  Administered 2017-01-21: 20 mg via ORAL
  Filled 2017-01-21: qty 1

## 2017-01-21 MED ORDER — SODIUM CHLORIDE 0.9% FLUSH: 3.0000 mL | INTRAVENOUS | Status: DC | PRN

## 2017-01-21 MED ORDER — MIDAZOLAM HCL 2 MG/2ML IJ SOLN
INTRAMUSCULAR | Status: AC
  Filled 2017-01-21: qty 2

## 2017-01-21 MED ORDER — PHENOL 1.4 % MT LIQD: 1.0000 | OROMUCOSAL | Status: DC | PRN

## 2017-01-21 MED ORDER — IBUPROFEN 400 MG PO TABS
400.0000 mg | ORAL_TABLET | Freq: Four times a day (QID) | ORAL | Status: DC | PRN
  Administered 2017-01-21: 400 mg via ORAL
  Filled 2017-01-21: qty 1

## 2017-01-21 MED ORDER — METOPROLOL TARTRATE 5 MG/5ML IV SOLN: 2.0000 mg | INTRAVENOUS | Status: DC | PRN

## 2017-01-21 MED ORDER — ENOXAPARIN SODIUM 30 MG/0.3ML ~~LOC~~ SOLN: 30.0000 mg | SUBCUTANEOUS | Status: DC

## 2017-01-21 MED ORDER — LIDOCAINE-EPINEPHRINE (PF) 1 %-1:200000 IJ SOLN
INTRAMUSCULAR | Status: AC
  Filled 2017-01-21: qty 30

## 2017-01-21 MED ORDER — SODIUM CHLORIDE 0.9% FLUSH: 3.0000 mL | Freq: Two times a day (BID) | INTRAVENOUS | Status: DC

## 2017-01-21 MED ORDER — CEFAZOLIN SODIUM-DEXTROSE 2-3 GM-%(50ML) IV SOLR
INTRAVENOUS | Status: DC | PRN
  Administered 2017-01-21: 2 g via INTRAVENOUS

## 2017-01-21 MED ORDER — FEBUXOSTAT 40 MG PO TABS
40.0000 mg | ORAL_TABLET | Freq: Every evening | ORAL | Status: DC
  Administered 2017-01-21: 40 mg via ORAL
  Filled 2017-01-21: qty 1

## 2017-01-21 MED ORDER — COLCHICINE 0.6 MG PO TABS
0.3000 mg | ORAL_TABLET | Freq: Every day | ORAL | Status: DC | PRN
  Filled 2017-01-21: qty 0.5

## 2017-01-21 MED ORDER — CEPHALEXIN 500 MG PO CAPS
500.0000 mg | ORAL_CAPSULE | Freq: Two times a day (BID) | ORAL | Status: DC
  Administered 2017-01-21: 500 mg via ORAL
  Filled 2017-01-21: qty 1

## 2017-01-21 MED ORDER — PROPOFOL 10 MG/ML IV BOLUS
INTRAVENOUS | Status: AC
  Filled 2017-01-21: qty 20

## 2017-01-21 MED ORDER — MIDAZOLAM HCL 5 MG/5ML IJ SOLN
INTRAMUSCULAR | Status: DC | PRN
  Administered 2017-01-21: 2 mg via INTRAVENOUS

## 2017-01-21 SURGICAL SUPPLY — 58 items
ADH SKN CLS APL DERMABOND .7 (GAUZE/BANDAGES/DRESSINGS) ×2
BAG DECANTER FOR FLEXI CONT (MISCELLANEOUS) ×3 IMPLANT
BANDAGE ELASTIC 4 VELCRO ST LF (GAUZE/BANDAGES/DRESSINGS) ×2 IMPLANT
BIOPATCH RED 1 DISK 7.0 (GAUZE/BANDAGES/DRESSINGS) ×1 IMPLANT
BNDG GAUZE ELAST 4 BULKY (GAUZE/BANDAGES/DRESSINGS) ×2 IMPLANT
CANISTER SUCT 3000ML PPV (MISCELLANEOUS) ×3 IMPLANT
CANNULA VESSEL 3MM 2 BLNT TIP (CANNULA) ×2 IMPLANT
CATH PALINDROME RT-P 15FX19CM (CATHETERS) IMPLANT
CATH PALINDROME RT-P 15FX23CM (CATHETERS) IMPLANT
CATH PALINDROME RT-P 15FX28CM (CATHETERS) IMPLANT
CATH PALINDROME RT-P 15FX55CM (CATHETERS) IMPLANT
CHLORAPREP W/TINT 26ML (MISCELLANEOUS) ×3 IMPLANT
COVER PROBE W GEL 5X96 (DRAPES) IMPLANT
COVER SURGICAL LIGHT HANDLE (MISCELLANEOUS) ×1 IMPLANT
DERMABOND ADVANCED (GAUZE/BANDAGES/DRESSINGS) ×1
DERMABOND ADVANCED .7 DNX12 (GAUZE/BANDAGES/DRESSINGS) ×2 IMPLANT
DRAPE C-ARM 42X72 X-RAY (DRAPES) ×1 IMPLANT
DRAPE CHEST BREAST 15X10 FENES (DRAPES) ×1 IMPLANT
ELECT REM PT RETURN 9FT ADLT (ELECTROSURGICAL) ×3
ELECTRODE REM PT RTRN 9FT ADLT (ELECTROSURGICAL) ×2 IMPLANT
GAUZE SPONGE 4X4 12PLY STRL (GAUZE/BANDAGES/DRESSINGS) ×2 IMPLANT
GAUZE SPONGE 4X4 16PLY XRAY LF (GAUZE/BANDAGES/DRESSINGS) ×1 IMPLANT
GLOVE BIO SURGEON STRL SZ7.5 (GLOVE) ×5 IMPLANT
GLOVE BIOGEL PI IND STRL 6.5 (GLOVE) ×1 IMPLANT
GLOVE BIOGEL PI IND STRL 7.5 (GLOVE) ×1 IMPLANT
GLOVE BIOGEL PI IND STRL 8 (GLOVE) ×2 IMPLANT
GLOVE BIOGEL PI INDICATOR 6.5 (GLOVE) ×1
GLOVE BIOGEL PI INDICATOR 7.5 (GLOVE) ×1
GLOVE BIOGEL PI INDICATOR 8 (GLOVE) ×1
GLOVE ECLIPSE 7.0 STRL STRAW (GLOVE) ×2 IMPLANT
GOWN STRL REUS W/ TWL LRG LVL3 (GOWN DISPOSABLE) ×6 IMPLANT
GOWN STRL REUS W/TWL LRG LVL3 (GOWN DISPOSABLE) ×9
KIT BASIN OR (CUSTOM PROCEDURE TRAY) ×3 IMPLANT
KIT ROOM TURNOVER OR (KITS) ×3 IMPLANT
NDL 18GX1X1/2 (RX/OR ONLY) (NEEDLE) ×1 IMPLANT
NDL HYPO 25GX1X1/2 BEV (NEEDLE) ×1 IMPLANT
NEEDLE 18GX1X1/2 (RX/OR ONLY) (NEEDLE) ×3 IMPLANT
NEEDLE HYPO 25GX1X1/2 BEV (NEEDLE) IMPLANT
NS IRRIG 1000ML POUR BTL (IV SOLUTION) ×3 IMPLANT
PACK CV ACCESS (CUSTOM PROCEDURE TRAY) ×3 IMPLANT
PACK SURGICAL SETUP 50X90 (CUSTOM PROCEDURE TRAY) ×1 IMPLANT
PAD ARMBOARD 7.5X6 YLW CONV (MISCELLANEOUS) ×6 IMPLANT
SPONGE SURGIFOAM ABS GEL 100 (HEMOSTASIS) IMPLANT
SUT ETHILON 3 0 PS 1 (SUTURE) ×7 IMPLANT
SUT PROLENE 5 0 C 1 24 (SUTURE) ×4 IMPLANT
SUT PROLENE 6 0 BV (SUTURE) ×4 IMPLANT
SUT SILK 0 TIES 10X30 (SUTURE) ×3 IMPLANT
SUT VIC AB 3-0 SH 27 (SUTURE) ×3
SUT VIC AB 3-0 SH 27X BRD (SUTURE) ×2 IMPLANT
SUT VICRYL 4-0 PS2 18IN ABS (SUTURE) ×3 IMPLANT
SYR 10ML LL (SYRINGE) ×1 IMPLANT
SYR 20CC LL (SYRINGE) ×2 IMPLANT
SYR 5ML LL (SYRINGE) ×2 IMPLANT
SYR CONTROL 10ML LL (SYRINGE) ×3 IMPLANT
TOWEL GREEN STERILE (TOWEL DISPOSABLE) ×6 IMPLANT
TOWEL GREEN STERILE FF (TOWEL DISPOSABLE) ×3 IMPLANT
UNDERPAD 30X30 (UNDERPADS AND DIAPERS) ×3 IMPLANT
WATER STERILE IRR 1000ML POUR (IV SOLUTION) ×3 IMPLANT

## 2017-01-21 NOTE — Transfer of Care (Signed)
Immediate Anesthesia Transfer of Care Note  Patient: Richard Bush  Procedure(s) Performed: REPAIR OF ARTERIOVENOUS  FISTULA (Left Arm Upper)  Patient Location: PACU  Anesthesia Type:MAC  Level of Consciousness: awake, alert  and oriented  Airway & Oxygen Therapy: Patient Spontanous Breathing  Post-op Assessment: Report given to RN and Post -op Vital signs reviewed and stable  Post vital signs: Reviewed and stable  Last Vitals:  Vitals:   01/21/17 1352  BP: (!) 116/43  Pulse: 81  Resp: 18  Temp: 36.7 C  SpO2: 98%    Last Pain:  Vitals:   01/21/17 1525  TempSrc:   PainSc: 0-No pain         Complications: No apparent anesthesia complications

## 2017-01-21 NOTE — ED Provider Notes (Signed)
[facility] PERIOPERATIVE AREA Provider Note   CSN: [id number] Arrival date & time: 01/21/17  1346     History   Chief Complaint Chief Complaint  Patient presents with  . Dialysis Fistula Problem    HPI Richard Bush is a 66 y.o. male.  HPI 66 year old male with a history of ESRD on dialysis who has a fistula in his left upper extremity that had recent resection of a necrotic ulcer on top of the fistula as well as suture ligation of the fistula.  The wound had dehiscence on December 2 and was taken to the OR by Dr. Donzetta Matters and then discharged.  He had repeat dehiscence yesterday which was packed and he was discharged.  His nephrologist placed him on p.o. Keflex and he took his third dose today.  He went to dialysis today and had a near full session excluding the last hour and then was told to come here for a vascular surgery consult.  He denies any recent fevers or pain.  No erythema or drainage surrounding the area of dehiscence.  Denies chest pain, shortness of breath, abdominal pain, vomiting or diarrhea.  Past Medical History:  Diagnosis Date  . Anemia   . Chronic right hip pain   . ESRD (end stage renal disease) on dialysis [facility])    dialysis M/W/F- [facility] (01/06/2017)  . GERD (gastroesophageal reflux disease)   . Gout   . High cholesterol   . Hypertension   . Neuropathy   . Osteoarthritis    "all over" (01/06/2017)  . Pleurisy   . Pneumonia 1980s X 1    Patient Active Problem List   Diagnosis Date Noted  . Orthostatic dizziness   . Bleeding pseudoaneurysm of left brachiocephalic AV fistula, initial encounter ([place]) 01/05/2017  . Pressure injury of skin 08/04/2016  . Secondary renal hyperparathyroidism ([place]) 08/04/2016  . Anemia secondary to renal failure 08/04/2016  . Pulmonary nodule 08/03/2016  . ESRD on dialysis ([place]) 08/03/2016  . Right bundle branch block 08/03/2016  . History of gastroesophageal reflux (GERD) 07/13/2016  . 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 Primary osteoarthritis of left knee 07/03/2016  . Stage 4 chronic kidney disease ([place]) 07/03/2016  . Osteoarthritis of hand 07/03/2016  . Hypertension 07/03/2016  . Secondary hyperparathyroidism ([place]) 07/03/2016  . Primary osteoarthritis of both feet 07/03/2016  . History of total hip replacement, right 07/03/2016  . Hx of total knee replacement, bilateral 07/03/2016    Past Surgical History:  Procedure Laterality Date  . A/V FISTULAGRAM Left 12/24/2016   Procedure: A/V FISTULAGRAM;  Surgeon: Waynetta Sandy, MD;  Location: [place] CV LAB;  Service: Cardiovascular;  Laterality: Left;  . APPENDECTOMY  1963  . AV FISTULA PLACEMENT Left 08/04/2016   Procedure: ARTERIOVENOUS (AV) FISTULA CREATION/LEFT ARM;  Surgeon: Serafina [facility], MD;  [facility];  Service: Vascular;  Laterality: Left;  . AV FISTULA REPAIR Left 01/06/2017   arm  . BILATERAL CARPAL TUNNEL RELEASE Bilateral 2006-2007   right-left  . CHOLECYSTECTOMY OPEN  1979  . COLONOSCOPY    . FINGER SURGERY Right 2006   "thumb; took out bone that conects to wrist and a piece of tendon; weaved tendon back in for mobility"  . FISTULA SUPERFICIALIZATION Left 09/24/2016   Procedure: FISTULA SUPERFICIALIZATION- LEFT ARM;  Surgeon: Serafina [facility], MD;  Location: [place];  Service: Vascular;  Laterality: Left;  . HAMMER TOE SURGERY Left 1981  . INSERTION OF DIALYSIS CATHETER Left 08/04/2016  Procedure: INSERTION OF DIALYSIS CATHETER;  Surgeon: Serafina [facility], MD;  Location: [place];  Service: Vascular;  Laterality: Left;  . JOINT REPLACEMENT    . LUMBAR LAMINECTOMY/DECOMPRESSION MICRODISCECTOMY N/A 08/18/2012   Procedure: LUMBAR LAMINECTOMY/DECOMPRESSION MICRODISCECTOMY 3 LEVELS;  Surgeon: Hosie Spangle, MD;  Location: [place] [facility];  Service: Neurosurgery;  Laterality: N/A;  Lumbar Two through Five Laminectomy  . REVISON OF ARTERIOVENOUS FISTULA Left 01/06/2017   Procedure: REVISON OF  ARTERIOVENOUS FISTULA LEFT ARM;  Surgeon: Serafina [facility], MD;  Location: [place];  Service: Vascular;  Laterality: Left;  . SHOULDER OPEN ROTATOR CUFF REPAIR Right 2001  . THROMBECTOMY AND REVISION OF ARTERIOVENTOUS (AV) GORETEX  GRAFT Left 01/17/2017   Procedure: REVISION OF ARTERIOVENTOUS (AV) FISTULA LEFT ARM;  Surgeon: Waynetta Sandy, MD;  Location: [place];  Service: Vascular;  Laterality: Left;  . TOTAL HIP ARTHROPLASTY Right 07/26/2013   Procedure: TOTAL HIP ARTHROPLASTY;  Surgeon: Kerin Salen, MD;  Location: [name];  Service: Orthopedics;  Laterality: Right;  . TOTAL KNEE ARTHROPLASTY Bilateral 2008-2010   right-left  . WRIST TENDON TRANSFER Bilateral 1972-1973   left-right       Home Medications    Prior to Admission medications   Medication Sig Start Date End Date Taking? Authorizing Provider  acetaminophen (TYLENOL) 650 MG CR tablet Take 650 mg by mouth every 8 (eight) hours as needed for pain.    [provider]  calcium acetate (PHOSLO) 667 MG capsule Take 1 capsule (667 mg total) by mouth 3 (three) times daily with meals. 08/08/16   Ledell Noss, MD  cephALEXin (KEFLEX) 500 MG capsule Take 1 capsule (500 mg total) by mouth 4 (four) times daily. 01/20/17   Law, Bea Graff, PA-C  cetirizine (ZYRTEC) 10 MG tablet Take 10 mg by mouth at bedtime.    [provider]  colchicine 0.6 MG tablet Take 0.5 tablets (0.3 mg total) by mouth daily as needed (for gout flareups). 09/24/16 01/17/22  Rhyne, Hulen Shouts, PA-C  febuxostat (ULORIC) 40 MG tablet Take 1 tablet (40 mg total) by mouth daily. Patient taking differently: Take 40 mg by mouth every evening.  10/06/16 04/04/17  Bo Merino, MD  ferric citrate (AURYXIA) 1 GM 210 MG(Fe) tablet Take 210 mg by mouth 3 (three) times daily with meals.     [provider]  folic acid-vitamin b complex-vitamin c-selenium-zinc (DIALYVITE) 3 MG TABS tablet Take 1 tablet by mouth every evening.     [provider]  gabapentin (NEURONTIN) 300 MG capsule Take 300 mg by mouth at bedtime.     [provider]  hydrALAZINE (APRESOLINE) 50 MG tablet Take 50 mg by mouth 3 (three) times daily. Only if BP above 150    [provider]  ibuprofen (ADVIL,MOTRIN) 200 MG tablet Take 400 mg by mouth every 6 (six) hours as needed for mild pain.     [provider]  midodrine (PROAMATINE) 10 MG tablet Take 10 mg by mouth every Monday, Wednesday, and Friday. 30 minutes before dialysis    [provider]  oxyCODONE-acetaminophen (ROXICET) 5-325 MG tablet Take 1 tablet by mouth every 6 (six) hours as needed for severe pain. Patient not taking: Reported on 01/15/2017 09/24/16   Gabriel Earing, PA-C  ranitidine (ZANTAC) 150 MG tablet Take 150 mg by mouth every morning.     [provider]  simvastatin (ZOCOR) 20 MG tablet Take 20 mg by mouth every evening.     [provider]    Family History Family History  Problem Relation Age of Onset  . Heart disease Mother   . Cancer Father     Social History Social History   Tobacco Use  . Smoking status: Former Smoker    Packs/day: 1.00    Years: 10.00    Pack years: 10.00    Types: Cigarettes    Last attempt to quit: 08/13/1982    Years since quitting: 34.4  . Smokeless tobacco: Former Systems developer    Types: Chew  Substance Use Topics  . Alcohol use: Yes    Comment: 01/06/2017 "used to be a social drinker; don't drink at all since ~ 2000"  . Drug use: No     Allergies   Cozaar [losartan potassium]; Kayexalate [polystyrene]; Altace [ramipril]; Other; Cholestyramine light; and Sulfa antibiotics   Review of Systems Review of Systems  Constitutional: Negative for chills and fever.  HENT: Negative for ear pain and sore throat.   Eyes: Negative for pain and visual disturbance.  Respiratory: Negative for cough and shortness of breath.   Cardiovascular: Negative for chest pain and palpitations.  Gastrointestinal:  Negative for abdominal pain and vomiting.  Genitourinary: Negative for dysuria and hematuria.  Musculoskeletal: Negative for arthralgias and back pain.  Skin: Positive for wound. Negative for color change and rash.  Neurological: Negative for seizures and syncope.  All other systems reviewed and are negative.    Physical Exam Updated Vital Signs BP (!) 116/43 (BP Location: Right Wrist)   Pulse 81   Temp 98 F (36.7 C) (Oral)   Resp 18   Ht 5\' 11"  (1.803 m)   Wt (!) 140.6 kg (310 lb)   SpO2 98%   BMI 43.24 kg/m   Physical Exam  Constitutional: He is oriented to person, place, and time. He appears well-developed and well-nourished.  HENT:  Head: Normocephalic and atraumatic.  Eyes: Conjunctivae are normal.  Neck: Neck supple.  Cardiovascular: Normal rate and regular rhythm.  No murmur heard. Pulses:      Radial pulses are 1+ on the right side, and 1+ on the left side.  Pulmonary/Chest: Effort normal and breath sounds normal. No tachypnea. No respiratory distress. He has no decreased breath sounds. He has no rales.  Abdominal: Soft. There is no tenderness.  Musculoskeletal: He exhibits no edema.  Neurological: He is alert and oriented to person, place, and time. He has normal strength. No cranial nerve deficit or sensory deficit.  LUE NVI  Skin: Skin is warm and dry.  4cm area of wound dehiscence on the L anterior upper arm with no surrounding erythema or drainage with what appears to be part of the fistula visible. No bleeding noted.   Psychiatric: He has a normal mood and affect.  Nursing note and vitals reviewed.    ED Treatments / Results  Labs (all labs ordered are listed, but only abnormal results are displayed) Labs Reviewed  CBC - Abnormal; Notable for the following components:      Result Value   RBC 3.10 (*)    Hemoglobin 10.3 (*)    HCT 32.3 (*)    MCV 104.2 (*)    All other components within normal limits  BASIC METABOLIC PANEL - Abnormal; Notable for  the following components:   Chloride 94 (*)    BUN 53 (*)    Creatinine, Ser 8.09 (*)    GFR calc non Af Amer 6 (*)    GFR calc Af Amer 7 (*)  Anion gap 17 (*)    All other components within normal limits    EKG  EKG Interpretation None       Radiology No results found.  Procedures Procedures (including critical care time)  Medications Ordered in ED Medications  ceFAZolin (ANCEF) 2-4 GM/100ML-% IVPB (not administered)  0.9 %  sodium chloride infusion ( Intravenous New Bag/Given 01/21/17 1901)     Initial Impression / Assessment and Plan / ED Course  I have reviewed the triage vital signs and the nursing notes.  Pertinent labs & imaging results that were available during my care of the patient were reviewed by me and considered in my medical decision making (see chart for details).     66 year old male with the above history presenting with concern for wound dehiscence over the left upper extremity fistula. Afebrile and HDS.  No chest pain or shortness of breath.  No signs of fluid overload.  Will get basic labs consult vascular surgery.  No significant electrolyte abnormalities.  Vascular surgery seen patient in the emergency department was taken to the OR for debridement and repair. Pt amenable with plan. HDS in the ED  Final Clinical Impressions(s) / ED Diagnoses   Final diagnoses:  Wound dehiscence    ED Discharge Orders    None       Carolyna Yerian Mali, MD 01/21/17 7972    Gareth Morgan, MD 01/25/17 1902

## 2017-01-21 NOTE — Progress Notes (Signed)
Visited with family while they were waiting for surgery.  Spouse requested prayer for the patient.  Chaplain happy to provide care for patient, prayed with patient and his spouse.   Very pleasant family.  They are appreciative of care.    01/21/17 1823  Clinical Encounter Type  Visited With Patient;Patient and family together  Visit Type Initial;Spiritual support  Spiritual Encounters  Spiritual Needs Prayer

## 2017-01-21 NOTE — ED Triage Notes (Signed)
Pt endorses his dialysis fistula being open for the 3rd time. Pt here yesterday for same reason and the fistula was packed and pt was d/c. Pt states that he went to dialysis today and it was cut short 1 hour then sent here by Dr Florene Glen who spoke to Dr Doren Custard with the vein clinic and informed him of the problem. VSS. Pt has no other complaints. Bleeding controlled and bandage in place.

## 2017-01-21 NOTE — Op Note (Signed)
    NAME: Edker Punt    MRN: [id number] DOB: Mar 21, 1950    DATE OF OPERATION: 01/21/2017  PREOP DIAGNOSIS:    Dehiscence of wound overlying left upper arm AV fistula  POSTOP DIAGNOSIS:    Same  PROCEDURE:    Repair of left upper arm AV fistula and closure of the wound  SURGEON: Judeth Cornfield. Scot Dock, MD, FACS  ASSIST: Gerri Lins PA  ANESTHESIA: Local with sedation  EBL: Minimal  INDICATIONS:    Tyrann Donaho is a 66 y.o. male who had undergone repair of his fistula previously and then closure of the wound.  This wound apparently dehisced.  On exam is visible.  We discussed trying to salvage the fistula at all possible potentially having to ligate the fistula in place a cath  FINDINGS:   I was able to repair the hole in the artery.  I was able to close the skin over the fistula.   TECHNIQUE:   The patient was taken to the operating room and sedated by anesthesia.  The left upper extremity was prepped and draped in the usual sterile fashion.  After the skin was anesthetized with 1% lidocaine I mobilized the fistula proximal and distal to the area of concern.  Patient was heparinized.  Fistula was compressed proximal and distal to the area of concern.  There was an aneurysmal area which was excised and the vein was repaired with a running 5-0 Prolene suture.  One other area was bleeding and this was repaired with a 5-0 Prolene.  I was then able to close the skin over the fistula with interrupted 3-0 nylon sutures.  He tolerated the procedure well and was transferred to the recovery room in stable condition.  All needle and sponge counts were correct.  Deitra Mayo, MD, FACS [facility]  DATE OF DICTATION:   01/21/2017

## 2017-01-21 NOTE — ED Notes (Signed)
IV team and EDP at bedside.

## 2017-01-21 NOTE — Consult Note (Signed)
[facility] NOTE   MRN : [id number]  Reason for Consult: Left fistula wound  Referring Physician: Neprologist  History of Present Illness: 66 y/o old male well known to our practice.  He was first seen in June of this year with new on set ESRD.  A TDC was placed and first stage basilic fistula.  Superficialized 09/24/2016.  He was seen in consult 12/24/2016 by Dr. Donzetta Matters for prolonged bleeding.  He performed Left arm fistulogram which showed no flow limiting stenosis.    He was next seen by Dr. Trula Slade on 01/06/2017 for multiple episodes of bleeding.  Procedure:   Resection of necrotic ulcer over top of the left upper arm fistula with suture ligation of the fistulotomy.  He was discharged the next day.  Sutures were removed in our office and he was not to use the area for 4 weeks total.  The wound dehisced and he returned to the ED 01/17/2017.  Attempted closure in the ED was unsuccessful.  He was taken to the OR by Dr. Donzetta Matters.    Dr. Jamse Mead OR comments.   Findings: There was one area where the fistula was very friable this was primarily repaired.  The tissue itself also appeared unhealthy after flaps were raised we were able to close it in 2 layers.  If patient has further issue with this fistula needs consideration of dealing with that issue followed by placement of a catheter to allow the left arm to rest.   His wound dehisced 01/21/2017 and has now returned to the ED.  He did have successful HD today above the wound site.  He reports no fever or chills.  Dr. Florene Glen placed him on PO Keflex yesterday and he has had three doses.   His left UE is without erythema or abnormal edema.     Past medical history history includes: HTN  Hyperlipidemia and gout.       No current facility-administered medications for this encounter.    Current Outpatient Medications  Medication Sig Dispense Refill  . acetaminophen (TYLENOL) 650 MG CR tablet Take 650 mg by mouth every 8  (eight) hours as needed for pain.    . calcium acetate (PHOSLO) 667 MG capsule Take 1 capsule (667 mg total) by mouth 3 (three) times daily with meals. 90 capsule 0  . cephALEXin (KEFLEX) 500 MG capsule Take 1 capsule (500 mg total) by mouth 4 (four) times daily. 20 capsule 0  . cetirizine (ZYRTEC) 10 MG tablet Take 10 mg by mouth at bedtime.    . colchicine 0.6 MG tablet Take 0.5 tablets (0.3 mg total) by mouth daily as needed (for gout flareups).    . febuxostat (ULORIC) 40 MG tablet Take 1 tablet (40 mg total) by mouth daily. (Patient taking differently: Take 40 mg by mouth every evening. ) 90 tablet 1  . ferric citrate (AURYXIA) 1 GM 210 MG(Fe) tablet Take 210 mg by mouth 3 (three) times daily with meals.     . folic acid-vitamin b complex-vitamin c-selenium-zinc (DIALYVITE) 3 MG TABS tablet Take 1 tablet by mouth every evening.     . gabapentin (NEURONTIN) 300 MG capsule Take 300 mg by mouth at bedtime.     . hydrALAZINE (APRESOLINE) 50 MG tablet Take 50 mg by mouth 3 (three) times daily. Only if BP above 150    . ibuprofen (ADVIL,MOTRIN) 200 MG tablet Take 400 mg by mouth every 6 (six) hours as needed for mild pain.     Marland Kitchen  midodrine (PROAMATINE) 10 MG tablet Take 10 mg by mouth every Monday, Wednesday, and Friday. 30 minutes before dialysis    . oxyCODONE-acetaminophen (ROXICET) 5-325 MG tablet Take 1 tablet by mouth every 6 (six) hours as needed for severe pain. (Patient not taking: Reported on 01/15/2017) 10 tablet 0  . ranitidine (ZANTAC) 150 MG tablet Take 150 mg by mouth every morning.     . simvastatin (ZOCOR) 20 MG tablet Take 20 mg by mouth every evening.       Pt meds include: Statin :Yes Betablocker: No ASA: No Other anticoagulants/antiplatelets: None  Past Medical History:  Diagnosis Date  . Anemia   . Chronic right hip pain   . ESRD (end stage renal disease) on dialysis [facility])    dialysis M/W/F- [facility] (01/06/2017)  . GERD (gastroesophageal reflux disease)   .  Gout   . High cholesterol   . Hypertension   . Neuropathy   . Osteoarthritis    "all over" (01/06/2017)  . Pleurisy   . Pneumonia 1980s X 1    Past Surgical History:  Procedure Laterality Date  . A/V FISTULAGRAM Left 12/24/2016   Procedure: A/V FISTULAGRAM;  Surgeon: Waynetta Sandy, MD;  Location: [place] [facility];  Service: Cardiovascular;  Laterality: Left;  . APPENDECTOMY  1963  . AV FISTULA PLACEMENT Left 08/04/2016   Procedure: ARTERIOVENOUS (AV) FISTULA CREATION/LEFT ARM;  Surgeon: Serafina Mitchell, MD;  Location: [facility];  Service: Vascular;  Laterality: Left;  . AV FISTULA REPAIR Left 01/06/2017   arm  . BILATERAL CARPAL TUNNEL RELEASE Bilateral 2006-2007   right-left  . CHOLECYSTECTOMY OPEN  1979  . COLONOSCOPY    . FINGER SURGERY Right 2006   "thumb; took out bone that conects to wrist and a piece of tendon; weaved tendon back in for mobility"  . FISTULA SUPERFICIALIZATION Left 09/24/2016   Procedure: FISTULA SUPERFICIALIZATION- LEFT ARM;  Surgeon: Serafina Mitchell, MD;  Location: [place];  Service: Vascular;  Laterality: Left;  . HAMMER TOE SURGERY Left 1981  . INSERTION OF DIALYSIS CATHETER Left 08/04/2016   Procedure: INSERTION OF DIALYSIS CATHETER;  Surgeon: Serafina Mitchell, MD;  Location: [place];  Service: Vascular;  Laterality: Left;  . JOINT REPLACEMENT    . LUMBAR LAMINECTOMY/DECOMPRESSION MICRODISCECTOMY N/A 08/18/2012   Procedure: LUMBAR LAMINECTOMY/DECOMPRESSION MICRODISCECTOMY 3 LEVELS;  Surgeon: Hosie Spangle, MD;  Location: [place] [facility];  Service: Neurosurgery;  Laterality: N/A;  Lumbar Two through Five Laminectomy  . REVISON OF ARTERIOVENOUS FISTULA Left 01/06/2017   Procedure: REVISON OF ARTERIOVENOUS FISTULA LEFT ARM;  Surgeon: Serafina Mitchell, MD;  Location: [place];  Service: Vascular;  Laterality: Left;  . SHOULDER OPEN ROTATOR CUFF REPAIR Right 2001  . THROMBECTOMY AND REVISION OF ARTERIOVENTOUS (AV) GORETEX  GRAFT Left 01/17/2017   Procedure:  REVISION OF ARTERIOVENTOUS (AV) FISTULA LEFT ARM;  Surgeon: Waynetta Sandy, MD;  Location: [place];  Service: Vascular;  Laterality: Left;  . TOTAL HIP ARTHROPLASTY Right 07/26/2013   Procedure: TOTAL HIP ARTHROPLASTY;  Surgeon: Kerin Salen, MD;  Location: [place];  Service: Orthopedics;  Laterality: Right;  . TOTAL KNEE ARTHROPLASTY Bilateral 2008-2010   right-left  . WRIST TENDON TRANSFER Bilateral 1972-1973   left-right    Social History Social History   Tobacco Use  . Smoking status: Former Smoker    Packs/day: 1.00    Years: 10.00    Pack years: 10.00    Types: Cigarettes    Last attempt to  quit: 08/13/1982    Years since quitting: 34.4  . Smokeless tobacco: Former Systems developer    Types: Chew  Substance Use Topics  . Alcohol use: Yes    Comment: 01/06/2017 "used to be a social drinker; don't drink at all since ~ 2000"  . Drug use: No    Family History Family History  Problem Relation Age of Onset  . Heart disease Mother   . Cancer Father     Allergies  Allergen Reactions  . Cozaar [Losartan Potassium] Other (See Comments)    Stopped due to kidney decline 4/18  . Kayexalate [Polystyrene] Other (See Comments)    Abnormal ekg  . Altace [Ramipril] Cough    Chronic cough  . Other Swelling    "Symvisc"   . Cholestyramine Light Other (See Comments)    UNSPECIFIED REACTION d  . Sulfa Antibiotics Rash     REVIEW OF SYSTEMS  General: [ ]  Weight loss, [ ]  Fever, [ ]  chills Neurologic: [ ]  Dizziness, [ ]  Blackouts, [ ]  Seizure [ ]  Stroke, [ ]  "Mini stroke", [ ]  Slurred speech, [ ]  Temporary blindness; [ ]  weakness in arms or legs, [ ]  Hoarseness [ ]  Dysphagia Cardiac: [ ]  Chest pain/pressure, [ ]  Shortness of breath at rest [ ]  Shortness of breath with exertion, [ ]  Atrial fibrillation or irregular heartbeat  Vascular: [ ]  Pain in legs with walking, [ ]  Pain in legs at rest, [ ]  Pain in legs at night,  [ ]  Non-healing ulcer, [ ]  Blood clot in vein/DVT,   Pulmonary:  [ ]  Home oxygen, [ ]  Productive cough, [ ]  Coughing up blood, [ ]  Asthma,  [ ]  Wheezing [ ]  COPD Musculoskeletal:  [ ]  Arthritis, [ ]  Low back pain, [ ]  Joint pain Hematologic: [ ]  Easy Bruising, [ ]  Anemia; [ ]  Hepatitis Gastrointestinal: [ ]  Blood in stool, [ ]  Gastroesophageal Reflux/heartburn, Urinary: [ ]  chronic Kidney disease, [x ] on HD - [ ]  MWF or [x ] TTHS, [ ]  Burning with urination, [ ]  Difficulty urinating Skin: [ ]  Rashes, [ x] Wounds Psychological: [ ]  Anxiety, [ ]  Depression  Physical Examination Vitals:   01/21/17 1352 01/21/17 1356  BP: (!) 116/43   Pulse: 81   Resp: 18   Temp: 98 F (36.7 C)   TempSrc: Oral   SpO2: 98%   Weight:  (!) 310 lb (140.6 kg)  Height:  5\' 11"  (1.803 m)   Body mass index is 43.24 kg/m.  General:  WDWN in NAD Gait: Normal HENT: WNL Eyes: Pupils equal Pulmonary: normal non-labored breathing , without Rales, rhonchi,  wheezing Cardiac: RRR, without  Murmurs, rubs or gallops; No carotid bruits Abdomen: soft, NT, no masses Skin: distal open wound over fistula beefy red base with purulent drainage 2-3 cm L x 1-2 cm W.  Vascular Exam/Pulses:[place] radial pulses B, palpable thrill in fistula   Musculoskeletal: no muscle wasting or atrophy; no edema  Neurologic: A&O X 3; Appropriate Affect ;  SENSATION: normal; MOTOR FUNCTION: 5/5 Symmetric Speech is fluent/normal   Significant Diagnostic Studies: CBC Lab Results  Component Value Date   WBC 5.5 01/07/2017   HGB 10.2 (L) 01/17/2017   HCT 30.0 (L) 01/17/2017   MCV 101.8 (H) 01/07/2017   PLT 191 01/07/2017    BMET    Component Value Date/Time   NA 142 01/17/2017 0903   NA 145 (H) 11/12/2016 1341   K 5.6 (H) 01/17/2017 0903   CL 96 (  L) 01/07/2017 0152   CO2 30 01/07/2017 0152   GLUCOSE 86 01/17/2017 0903   BUN 46 (H) 01/07/2017 0152   BUN 46 (H) 11/12/2016 1341   CREATININE 9.12 (H) 01/07/2017 0152   CALCIUM 8.8 (L) 01/07/2017 0152   GFRNONAA 5 (L) 01/07/2017 0152    GFRAA 6 (L) 01/07/2017 0152   Estimated Creatinine Clearance: 11.3 mL/min (A) (by C-G formula based on SCr of 9.12 mg/dL (H)).  COAG Lab Results  Component Value Date   INR 1.11 01/05/2017   INR 1.04 07/18/2013   INR 2.1 (H) 03/08/2008       ASSESSMENT/PLAN:  ESRD with non healing wound after revision of AV fistula. CBC and BMET pending patient is NPO   Roxy Horseman 01/21/2017 2:36 PM

## 2017-01-21 NOTE — ED Notes (Signed)
OR called and are ready for the pt. Report given by this RN.

## 2017-01-21 NOTE — Anesthesia Preprocedure Evaluation (Addendum)
Anesthesia Evaluation  Patient identified by MRN, date of birth, ID band Patient awake    Reviewed: Allergy & Precautions, NPO status , Patient's Chart, lab work & pertinent test results  Airway Mallampati: II  TM Distance: >3 FB     Dental   Pulmonary pneumonia, former smoker,    breath sounds clear to auscultation       Cardiovascular hypertension, + dysrhythmias  Rhythm:Regular Rate:Normal     Neuro/Psych    GI/Hepatic GERD  ,  Endo/Other    Renal/GU Renal disease     Musculoskeletal   Abdominal   Peds  Hematology  (+) anemia ,   Anesthesia Other Findings   Reproductive/Obstetrics                            Anesthesia Physical Anesthesia Plan  ASA: III  Anesthesia Plan: MAC   Post-op Pain Management:    Induction: Intravenous  PONV Risk Score and Plan: 1 and Treatment may vary due to age or medical condition, Ondansetron and Midazolam  Airway Management Planned: Simple Face Mask  Additional Equipment:   Intra-op Plan:   Post-operative Plan:   Informed Consent: I have reviewed the patients History and Physical, chart, labs and discussed the procedure including the risks, benefits and alternatives for the proposed anesthesia with the patient or authorized representative who has indicated his/her understanding and acceptance.   Dental advisory given  Plan Discussed with: CRNA and Anesthesiologist  Anesthesia Plan Comments:         Anesthesia Quick Evaluation

## 2017-01-22 ENCOUNTER — Encounter (HOSPITAL_COMMUNITY): Payer: Self-pay | Admitting: Vascular Surgery

## 2017-01-22 NOTE — Progress Notes (Addendum)
   VASCULAR SURGERY ASSESSMENT & PLAN:   1 Day Post-Op s/p: repair of left BC  AVF  Wound inspected and looks good.  Home this AM  He will go to his out pt dialysis center at 11:30  Finish Keflex  No shower for 2 weeks (discussed with patient and wife)  SUBJECTIVE:   No complaints  PHYSICAL EXAM:   Vitals:   01/21/17 2119 01/21/17 2133 01/21/17 2300 01/22/17 0437  BP: (!) 110/49 97/81 (!) 96/43 (!) 90/36  Pulse:  85 74 74  Resp: 18 15 18 18   Temp:  (!) 97.4 F (36.3 C) 97.8 F (36.6 C) 97.9 F (36.6 C)  TempSrc:   Axillary Oral  SpO2: 100% 100% 92% 92%  Weight:      Height:       Good thrill in left AVF Incision looks good.   LABS:   Lab Results  Component Value Date   WBC 6.0 01/21/2017   HGB 10.3 (L) 01/21/2017   HCT 32.3 (L) 01/21/2017   MCV 104.2 (H) 01/21/2017   PLT 246 01/21/2017   Lab Results  Component Value Date   CREATININE 8.09 (H) 01/21/2017   Lab Results  Component Value Date   INR 1.11 01/05/2017   CBG (last 3)  No results for input(s): GLUCAP in the last 72 hours.  PROBLEM LIST:    Active Problems:   ESRD (end stage renal disease) (HCC)   CURRENT MEDS:   . calcium acetate  667 mg Oral TID WC  . cephALEXin  500 mg Oral Q12H  . enoxaparin (LOVENOX) injection  30 mg Subcutaneous Q24H  . famotidine  10 mg Oral BID  . febuxostat  40 mg Oral QPM  . ferric citrate  210 mg Oral TID WC  . gabapentin  300 mg Oral QHS  . hydrALAZINE  50 mg Oral TID  . midodrine  10 mg Oral Q M,W,F  . pantoprazole  40 mg Oral Daily  . potassium chloride  20-40 mEq Oral Once  . simvastatin  20 mg Oral QPM  . sodium chloride flush  3 mL Intravenous Q12H    Deitra Mayo Beeper: [phone] Office: [phone] 01/22/2017

## 2017-01-22 NOTE — Anesthesia Postprocedure Evaluation (Signed)
Anesthesia Post Note  Patient: Richard Bush  Procedure(s) Performed: REPAIR OF ARTERIOVENOUS  FISTULA (Left Arm Upper)     Patient location during evaluation: PACU Anesthesia Type: MAC Level of consciousness: awake Pain management: pain level controlled Vital Signs Assessment: post-procedure vital signs reviewed and stable Respiratory status: spontaneous breathing Cardiovascular status: stable Anesthetic complications: no    Last Vitals:  Vitals:   01/21/17 2133 01/21/17 2300  BP: 97/81 (!) 96/43  Pulse: 85 74  Resp: 15 18  Temp: (!) 36.3 C 36.6 C  SpO2: 100% 92%    Last Pain:  Vitals:   01/21/17 2344  TempSrc:   PainSc: Asleep                 Yariah Selvey

## 2017-01-25 ENCOUNTER — Ambulatory Visit: Payer: Medicare Other | Admitting: Family

## 2017-01-26 ENCOUNTER — Telehealth: Payer: Self-pay | Admitting: Vascular Surgery

## 2017-01-26 ENCOUNTER — Other Ambulatory Visit: Payer: Self-pay | Admitting: *Deleted

## 2017-01-26 MED ORDER — CEPHALEXIN 500 MG PO CAPS: 500.0000 mg | ORAL_CAPSULE | Freq: Four times a day (QID) | ORAL | 0 refills | Status: DC

## 2017-01-26 NOTE — Telephone Encounter (Signed)
-----   Message from Mena Goes, RN sent at 01/22/2017  1:31 PM EST ----- Regarding: 2 weeks   ----- Message ----- From: Ulyses Amor, PA-C Sent: 01/22/2017   8:21 AM To: Vvs Charge Pool  Surgery last night repair left UE fistula f/u with Dr. Scot Dock in 2 weeks PLS cancel other appointments

## 2017-01-26 NOTE — Telephone Encounter (Signed)
2 wk f/u Over book 12/19

## 2017-01-26 NOTE — Discharge Summary (Signed)
[facility] Discharge Summary   Patient ID:  Richard Bush MRN: [id number] DOB/AGE: Oct 24, 1950 66 y.o.  Admit date: 01/21/2017 Discharge date: 01/22/2017 Date of Surgery: 01/21/2017 Surgeon: Surgeon(s): Angelia Mould, MD  Admission Diagnosis: Fiscal open  Discharge Diagnoses:  Fiscal open  Secondary Diagnoses: Past Medical History:  Diagnosis Date  . Anemia   . Chronic right hip pain   . ESRD (end stage renal disease) on dialysis [facility])    dialysis M/W/F- [facility] (01/06/2017)  . GERD (gastroesophageal reflux disease)   . Gout   . High cholesterol   . Hypertension   . Neuropathy   . Osteoarthritis    "all over" (01/06/2017)  . Pleurisy   . Pneumonia 1980s X 1    Procedure(s): REPAIR OF ARTERIOVENOUS  FISTULA  Discharged Condition: stable  HPI:  History of Present Illness: 66 y/o old male well known to our practice.  He was first seen in June of this year with new on set ESRD.  A TDC was placed and first stage basilic fistula.  Superficialized 09/24/2016.  He was seen in consult 12/24/2016 by Dr. Donzetta Matters for prolonged bleeding.  He performed Left arm fistulogram which showed no flow limiting stenosis.               He was next seen by Dr. Trula Slade on 01/06/2017 for multiple episodes of bleeding.  Procedure:Resection of necrotic ulcer over top of the left upper arm fistula with suture ligation of the fistulotomy.  He was discharged the next day.  Sutures were removed in our office and he was not to use the area for 4 weeks total.  The wound dehisced and he returned to the ED 01/17/2017.  Attempted closure in the ED was unsuccessful.  He was taken to the OR by Dr. Donzetta Matters.    Dr. Jamse Mead OR comments.         Findings:There was one area where the fistula was very friable this was primarily repaired. The tissue itself also appeared unhealthy after flaps were raised we were able to close it in 2 layers.  If patient has further issue with this  fistulaneedsconsideration ofdealing with that issue followed by placement of a catheter to allow the left arm to rest.              His wound dehisced 01/21/2017 and has now returned to the ED.  He did have successful HD today above the wound site.  He reports no fever or chills.  Dr. Florene Glen placed him on PO Keflex yesterday and he has had three doses.   His left UE is without erythema or abnormal edema.                Past medical history history includes: HTN  Hyperlipidemia and gout.   Hospital Course:  Richard Bush is a 66 y.o. male is S/P left Procedure(s): REPAIR OF ARTERIOVENOUS  FISTULA   1 Day Post-Op s/p: repair of left BC  AVF  Wound inspected and looks good.  Home this AM  He will go to his out pt dialysis center at 11:30  Finish Keflex  No shower for 2 weeks (discussed with patient and wife)    Consults:  Treatment Team:  Angelia Mould, MD  Significant Diagnostic Studies: CBC Lab Results  Component Value Date   WBC 6.0 01/21/2017   HGB 10.3 (L) 01/21/2017   HCT 32.3 (L) 01/21/2017   MCV 104.2 (H) 01/21/2017   PLT 246 01/21/2017  BMET    Component Value Date/Time   NA 140 01/21/2017 1445   NA 145 (H) 11/12/2016 1341   K 4.3 01/21/2017 1445   CL 94 (L) 01/21/2017 1445   CO2 29 01/21/2017 1445   GLUCOSE 90 01/21/2017 1445   BUN 53 (H) 01/21/2017 1445   BUN 46 (H) 11/12/2016 1341   CREATININE 8.09 (H) 01/21/2017 1445   CALCIUM 9.1 01/21/2017 1445   GFRNONAA 6 (L) 01/21/2017 1445   GFRAA 7 (L) 01/21/2017 1445   COAG Lab Results  Component Value Date   INR 1.11 01/05/2017   INR 1.04 07/18/2013   INR 2.1 (H) 03/08/2008     Disposition:  Discharge to :Home Discharge Instructions    Call MD for:  redness, tenderness, or signs of infection (pain, swelling, bleeding, redness, odor or green/yellow discharge around incision site)   Complete by:  As directed    Call MD for:  severe or increased pain, loss or decreased feeling   in affected limb(s)   Complete by:  As directed    Call MD for:  temperature >100.5   Complete by:  As directed    Discharge instructions   Complete by:  As directed    Keep incision clean and dry for 2 weeks.   Resume previous diet   Complete by:  As directed      Allergies as of 01/22/2017      Reactions   Cozaar [losartan Potassium] Other (See Comments)   Stopped due to kidney decline 4/18   Kayexalate [polystyrene] Other (See Comments)   Abnormal ekg   Altace [ramipril] Cough   Chronic cough   Other Swelling   "Symvisc"    Cholestyramine Light Other (See Comments)   UNSPECIFIED REACTION d   Sulfa Antibiotics Rash      Medication List    TAKE these medications   acetaminophen 650 MG CR tablet Commonly known as:  TYLENOL Take 650 mg by mouth every 8 (eight) hours as needed for pain.   AURYXIA 1 GM 210 MG(Fe) tablet Generic drug:  ferric citrate Take 210 mg by mouth 3 (three) times daily with meals.   calcium acetate 667 MG capsule Commonly known as:  PHOSLO Take 1 capsule (667 mg total) by mouth 3 (three) times daily with meals.   cetirizine 10 MG tablet Commonly known as:  ZYRTEC Take 10 mg by mouth at bedtime.   colchicine 0.6 MG tablet Take 0.5 tablets (0.3 mg total) by mouth daily as needed (for gout flareups).   febuxostat 40 MG tablet Commonly known as:  ULORIC Take 1 tablet (40 mg total) by mouth daily. What changed:  when to take this   folic acid-vitamin b complex-vitamin c-selenium-zinc 3 MG Tabs tablet Take 1 tablet by mouth every evening.   gabapentin 300 MG capsule Commonly known as:  NEURONTIN Take 300 mg by mouth at bedtime.   hydrALAZINE 50 MG tablet Commonly known as:  APRESOLINE Take 50 mg by mouth 3 (three) times daily. Only if BP above 150   ibuprofen 200 MG tablet Commonly known as:  ADVIL,MOTRIN Take 400 mg by mouth every 6 (six) hours as needed for mild pain.   midodrine 10 MG tablet Commonly known as:  PROAMATINE Take 10  mg by mouth every Monday, Wednesday, and Friday. 30 minutes before dialysis   oxyCODONE-acetaminophen 5-325 MG tablet Commonly known as:  ROXICET Take 1 tablet by mouth every 6 (six) hours as needed for severe pain.   ranitidine  150 MG tablet Commonly known as:  ZANTAC Take 150 mg by mouth every morning.   simvastatin 20 MG tablet Commonly known as:  ZOCOR Take 20 mg by mouth every evening.      Verbal and written Discharge instructions given to the patient. Wound care per Discharge AVS Follow-up Information    Angelia Mould, MD Follow up in 2 week(s).   Specialties:  Vascular Surgery, Cardiology Why:  office will call Contact information: [address] [place] [address] [phone]           Signed: Roxy Horseman 01/26/2017, 3:53 PM

## 2017-02-03 ENCOUNTER — Ambulatory Visit (INDEPENDENT_AMBULATORY_CARE_PROVIDER_SITE_OTHER): Payer: Medicare Other | Admitting: Vascular Surgery

## 2017-02-03 ENCOUNTER — Encounter: Payer: Self-pay | Admitting: Vascular Surgery

## 2017-02-03 VITALS — BP 110/63 | HR 71 | Resp 20 | Ht 71.0 in | Wt 319.0 lb

## 2017-02-03 DIAGNOSIS — Z48812 Encounter for surgical aftercare following surgery on the circulatory system: Secondary | ICD-10-CM

## 2017-02-03 NOTE — Progress Notes (Signed)
Patient name: Richard Bush MRN: [id number] DOB: 1950/06/13 Sex: male  REASON FOR VISIT:   Follow-up after repair of left upper arm fistula.  HPI:   Richard Bush is a pleasant 66 y.o. male who underwent repair of a left upper arm fistula and reclosure of the wound on 01/21/2017.  He is undergone repair previously with closure of the wound but this dehisced.  I felt that it was worth trying to salvage the fistula versus having to ligate the fistula in place a catheter.  He comes in for a 2-week follow-up visit.  Since I saw him last, he has been doing well.  His fistula has been working well.  He denies fever or chills.  He just completed his antibiotics.  Current Outpatient Medications  Medication Sig Dispense Refill  . acetaminophen (TYLENOL) 650 MG CR tablet Take 650 mg by mouth every 8 (eight) hours as needed for pain.    . calcium acetate (PHOSLO) 667 MG capsule Take 1 capsule (667 mg total) by mouth 3 (three) times daily with meals. 90 capsule 0  . cetirizine (ZYRTEC) 10 MG tablet Take 10 mg by mouth at bedtime.    . colchicine 0.6 MG tablet Take 0.5 tablets (0.3 mg total) by mouth daily as needed (for gout flareups).    . febuxostat (ULORIC) 40 MG tablet Take 1 tablet (40 mg total) by mouth daily. (Patient taking differently: Take 40 mg by mouth every evening. ) 90 tablet 1  . folic acid-vitamin b complex-vitamin c-selenium-zinc (DIALYVITE) 3 MG TABS tablet Take 1 tablet by mouth every evening.     . gabapentin (NEURONTIN) 300 MG capsule Take 300 mg by mouth at bedtime.     Marland Kitchen ibuprofen (ADVIL,MOTRIN) 200 MG tablet Take 400 mg by mouth every 6 (six) hours as needed for mild pain.     . midodrine (PROAMATINE) 10 MG tablet Take 10 mg by mouth every Monday, Wednesday, and Friday. 30 minutes before dialysis    . oxyCODONE-acetaminophen (ROXICET) 5-325 MG tablet Take 1 tablet by mouth every 6 (six) hours as needed for severe pain. 10 tablet 0  . ranitidine (ZANTAC) 150 MG tablet Take 150 mg by  mouth every morning.     . simvastatin (ZOCOR) 20 MG tablet Take 20 mg by mouth every evening.     . cephALEXin (KEFLEX) 500 MG capsule Take 1 capsule (500 mg total) by mouth 4 (four) times daily. (Patient not taking: Reported on 02/03/2017) 20 capsule 0  . ferric citrate (AURYXIA) 1 GM 210 MG(Fe) tablet Take 210 mg by mouth 3 (three) times daily with meals.     . hydrALAZINE (APRESOLINE) 50 MG tablet Take 50 mg by mouth 3 (three) times daily. Only if BP above 150     No current facility-administered medications for this visit.     REVIEW OF SYSTEMS:  [X]  denotes positive finding, [ ]  denotes negative finding Cardiac  Comments:  Chest pain or chest pressure:    Shortness of breath upon exertion:    Short of breath when lying flat:    Irregular heart rhythm:    Constitutional    Fever or chills:     PHYSICAL EXAM:   Vitals:   02/03/17 0851  BP: 110/63  Pulse: 71  Resp: 20  SpO2: 96%  Weight: (!) 319 lb (144.7 kg)  Height: 5\' 11"  (1.803 m)    GENERAL: The patient is a well-nourished male, in no acute distress. The vital signs are documented above. CARDIOVASCULAR:  There is a regular rate and rhythm. PULMONARY: There is good air exchange bilaterally without wheezing or rales. The fistula has a good thrill. The wound is intact.  DATA:   No new data.  MEDICAL ISSUES:   STATUS POST REPAIR OF LEFT UPPER ARM FISTULA: Patient is doing well status post repair of a dehisced wound in his left upper arm.  He is 2 weeks out.  He will come back in 2 weeks for suture removal hopefully.  He will call if his cellulitis returns and we can restart his antibiotics.  Deitra Mayo [facility] [phone]

## 2017-02-19 ENCOUNTER — Encounter: Payer: Self-pay | Admitting: Family

## 2017-02-19 ENCOUNTER — Ambulatory Visit (INDEPENDENT_AMBULATORY_CARE_PROVIDER_SITE_OTHER): Payer: Medicare Other | Admitting: Family

## 2017-02-19 VITALS — BP 96/61 | HR 75 | Temp 98.2°F | Resp 20 | Ht 71.0 in | Wt 317.0 lb

## 2017-02-19 DIAGNOSIS — Z4802 Encounter for removal of sutures: Secondary | ICD-10-CM

## 2017-02-19 DIAGNOSIS — N186 End stage renal disease: Secondary | ICD-10-CM

## 2017-02-19 DIAGNOSIS — T8131XD Disruption of external operation (surgical) wound, not elsewhere classified, subsequent encounter: Secondary | ICD-10-CM

## 2017-02-19 DIAGNOSIS — Z992 Dependence on renal dialysis: Secondary | ICD-10-CM

## 2017-02-19 DIAGNOSIS — I770 Arteriovenous fistula, acquired: Secondary | ICD-10-CM

## 2017-02-19 NOTE — Progress Notes (Signed)
Postoperative Access Visit   History of Present Illness  Richard Bush is a 67 y.o. year old male who is s/p repair of left upper arm AV fistula and closure of the wound on 01-21-17 by Dr. Scot Dock. He had undergone repair of his fistula previously by Dr. Donzetta Matters on 01-17-17 and then closure of the wound.  This wound apparently dehisced.   He returns today for removal of sutures.   He dialyzes via left upper arm access: proximal and distal to incision.  Left upper arm incision is healing, no signs of infection, no drainage.  The patient notes no steal symptoms.  The patient is able to complete their activities of daily living.    For VQI Use Only  PRE-ADM LIVING: Home  AMB STATUS: Ambulatory   Past Medical History:  Diagnosis Date  . Anemia   . Chronic right hip pain   . ESRD (end stage renal disease) on dialysis [facility])    dialysis M/W/F- [facility] (01/06/2017)  . GERD (gastroesophageal reflux disease)   . Gout   . High cholesterol   . Hypertension   . Neuropathy   . Osteoarthritis    "all over" (01/06/2017)  . Pleurisy   . Pneumonia 1980s X 1    Past Surgical History:  Procedure Laterality Date  . A/V FISTULAGRAM Left 12/24/2016   Procedure: A/V FISTULAGRAM;  Surgeon: Waynetta Sandy, MD;  Location: [place] [facility];  Service: Cardiovascular;  Laterality: Left;  . APPENDECTOMY  1963  . AV FISTULA PLACEMENT Left 08/04/2016   Procedure: ARTERIOVENOUS (AV) FISTULA CREATION/LEFT ARM;  Surgeon: Serafina Mitchell, MD;  [facility];  Service: Vascular;  Laterality: Left;  . AV FISTULA REPAIR Left 01/06/2017   arm  . BILATERAL CARPAL TUNNEL RELEASE Bilateral 2006-2007   right-left  . CHOLECYSTECTOMY OPEN  1979  . COLONOSCOPY    . FINGER SURGERY Right 2006   "thumb; took out bone that conects to wrist and a piece of tendon; weaved tendon back in for mobility"  . FISTULA SUPERFICIALIZATION Left 09/24/2016   Procedure: FISTULA SUPERFICIALIZATION- LEFT ARM;   Surgeon: Serafina Mitchell, MD;  Location: [place];  Service: Vascular;  Laterality: Left;  . HAMMER TOE SURGERY Left 1981  . INSERTION OF DIALYSIS CATHETER Left 08/04/2016   Procedure: INSERTION OF DIALYSIS CATHETER;  Surgeon: Serafina Mitchell, MD;  Location: [place];  Service: Vascular;  Laterality: Left;  . JOINT REPLACEMENT    . LIGATION OF ARTERIOVENOUS  FISTULA Left 01/21/2017   Procedure: REPAIR OF ARTERIOVENOUS  FISTULA;  Surgeon: Angelia Mould, MD;  Location: [place];  Service: Vascular;  Laterality: Left;  . LUMBAR LAMINECTOMY/DECOMPRESSION MICRODISCECTOMY N/A 08/18/2012   Procedure: LUMBAR LAMINECTOMY/DECOMPRESSION MICRODISCECTOMY 3 LEVELS;  Surgeon: Hosie Spangle, MD;  Location: [place] [facility];  Service: Neurosurgery;  Laterality: N/A;  Lumbar Two through Five Laminectomy  . REVISON OF ARTERIOVENOUS FISTULA Left 01/06/2017   Procedure: REVISON OF ARTERIOVENOUS FISTULA LEFT ARM;  Surgeon: Serafina Mitchell, MD;  Location: [place];  Service: Vascular;  Laterality: Left;  . SHOULDER OPEN ROTATOR CUFF REPAIR Right 2001  . THROMBECTOMY AND REVISION OF ARTERIOVENTOUS (AV) GORETEX  GRAFT Left 01/17/2017   Procedure: REVISION OF ARTERIOVENTOUS (AV) FISTULA LEFT ARM;  Surgeon: Waynetta Sandy, MD;  Location: [place];  Service: Vascular;  Laterality: Left;  . TOTAL HIP ARTHROPLASTY Right 07/26/2013   Procedure: TOTAL HIP ARTHROPLASTY;  Surgeon: Kerin Salen, MD;  Location: [place];  Service:  Orthopedics;  Laterality: Right;  . TOTAL KNEE ARTHROPLASTY Bilateral 2008-2010   right-left  . WRIST TENDON TRANSFER Bilateral 1972-1973   left-right    Social History   Socioeconomic History  . Marital status: Married    Spouse name: Not on file  . Number of children: Not on file  . Years of education: Not on file  . Highest education level: Not on file  Social Needs  . Financial resource strain: Not on file  . Food insecurity - worry: Not on file  . Food insecurity - inability: Not on file   . Transportation needs - medical: Not on file  . Transportation needs - non-medical: Not on file  Occupational History  . Not on file  Tobacco Use  . Smoking status: Former Smoker    Packs/day: 1.00    Years: 10.00    Pack years: 10.00    Types: Cigarettes    Last attempt to quit: 08/13/1982    Years since quitting: 34.5  . Smokeless tobacco: Former Systems developer    Types: Chew  Substance and Sexual Activity  . Alcohol use: Yes    Comment: 01/06/2017 "used to be a social drinker; don't drink at all since ~ 2000"  . Drug use: No  . Sexual activity: Yes  Other Topics Concern  . Not on file  Social History Narrative  . Not on file    Allergies  Allergen Reactions  . Cozaar [Losartan Potassium] Other (See Comments)    Stopped due to kidney decline 4/18  . Kayexalate [Polystyrene] Other (See Comments)    Abnormal ekg  . Altace [Ramipril] Cough    Chronic cough  . Other Swelling    "Symvisc"   . Cholestyramine Light Other (See Comments)    UNSPECIFIED REACTION d  . Sulfa Antibiotics Rash    Current Outpatient Medications on File Prior to Visit  Medication Sig Dispense Refill  . acetaminophen (TYLENOL) 650 MG CR tablet Take 650 mg by mouth every 8 (eight) hours as needed for pain.    . calcium acetate (PHOSLO) 667 MG capsule Take 1 capsule (667 mg total) by mouth 3 (three) times daily with meals. 90 capsule 0  . cephALEXin (KEFLEX) 500 MG capsule Take 1 capsule (500 mg total) by mouth 4 (four) times daily. 20 capsule 0  . cetirizine (ZYRTEC) 10 MG tablet Take 10 mg by mouth at bedtime.    . colchicine 0.6 MG tablet Take 0.5 tablets (0.3 mg total) by mouth daily as needed (for gout flareups).    . febuxostat (ULORIC) 40 MG tablet Take 1 tablet (40 mg total) by mouth daily. (Patient taking differently: Take 40 mg by mouth every evening. ) 90 tablet 1  . ferric citrate (AURYXIA) 1 GM 210 MG(Fe) tablet Take 210 mg by mouth 3 (three) times daily with meals.     . folic acid-vitamin b  complex-vitamin c-selenium-zinc (DIALYVITE) 3 MG TABS tablet Take 1 tablet by mouth every evening.     . gabapentin (NEURONTIN) 300 MG capsule Take 300 mg by mouth at bedtime.     . hydrALAZINE (APRESOLINE) 50 MG tablet Take 50 mg by mouth 3 (three) times daily. Only if BP above 150    . ibuprofen (ADVIL,MOTRIN) 200 MG tablet Take 400 mg by mouth every 6 (six) hours as needed for mild pain.     . midodrine (PROAMATINE) 10 MG tablet Take 10 mg by mouth every Monday, Wednesday, and Friday. 30 minutes before dialysis    .  oxyCODONE-acetaminophen (ROXICET) 5-325 MG tablet Take 1 tablet by mouth every 6 (six) hours as needed for severe pain. 10 tablet 0  . ranitidine (ZANTAC) 150 MG tablet Take 150 mg by mouth every morning.     . simvastatin (ZOCOR) 20 MG tablet Take 20 mg by mouth every evening.      No current facility-administered medications on file prior to visit.      Physical Examination Vitals:   02/19/17 1554  BP: 96/61  Pulse: 75  Resp: 20  Temp: 98.2 F (36.8 C)  TempSrc: Oral  SpO2: 90%  Weight: (!) 317 lb (143.8 kg)  Height: 5\' 11"  (1.803 m)   Body mass index is 44.21 kg/m.  Left upper arm incision is healing, no signs of infection, no drainage, hand grip is 5/5, sensation in digits is intact, palpable thrill.  Medical Decision Making  Richard Bush is a 67 y.o. year old male who presents s/p repair of left upper arm AV fistula and closure of the wound on 01-21-17 by Dr. Scot Dock. He had undergone repair of his fistula previously by Dr. Donzetta Matters on 01-17-17 and then closure of the wound.   Dr. Donzetta Matters spoke with and examined pt. Will remove every other suture, and pt will return in 2 weeks for remainder of suture removal.    Thank you for allowing [place] to participate in this patient's care.  Clemon Chambers, RN, MSN, FNP-C [facility] [place] Office: [phone]  02/19/2017, 4:29 PM  Clinic MD: Donzetta Matters

## 2017-03-11 ENCOUNTER — Encounter: Payer: Self-pay | Admitting: Family

## 2017-03-11 ENCOUNTER — Other Ambulatory Visit: Payer: Self-pay

## 2017-03-11 ENCOUNTER — Ambulatory Visit (INDEPENDENT_AMBULATORY_CARE_PROVIDER_SITE_OTHER): Payer: Medicare Other | Admitting: Family

## 2017-03-11 VITALS — BP 84/57 | HR 74 | Temp 98.4°F | Resp 16 | Ht 71.0 in | Wt 314.0 lb

## 2017-03-11 DIAGNOSIS — N186 End stage renal disease: Secondary | ICD-10-CM

## 2017-03-11 DIAGNOSIS — Z4802 Encounter for removal of sutures: Secondary | ICD-10-CM

## 2017-03-11 DIAGNOSIS — I770 Arteriovenous fistula, acquired: Secondary | ICD-10-CM

## 2017-03-11 DIAGNOSIS — Z992 Dependence on renal dialysis: Secondary | ICD-10-CM

## 2017-03-11 NOTE — Progress Notes (Signed)
Postoperative Access Visit   History of Present Illness  Richard Bush is a 67 y.o. year old male who is s/p repair of left upper arm AV fistula and closure of the wound on 01-21-17 by Dr. Scot Dock. He had undergone repair of his fistula previously by Dr. Donzetta Matters on 01-17-17 and then closure of the wound. This wound apparently dehisced.  At pt visit on 02-19-17, every other suture was removed, and pt was to return in 2 weeks for remainder of suture removal.   He dialyzes via left upper arm access: proximal and distal to incision on  M-W-F at [employer].  He gets significant cramping in his extremities within the 5 hours after HD.  He states he is a little dizzy today, but not dizzy in general. He is hypotensive today.   Left upper arm incision has healed, 2 small contracting scabs remain, no signs of infection, no drainage. He denies fever or chills.   The patient notes no steal symptoms.  The patient is able to complete their activities of daily living.    For VQI Use Only  PRE-ADM LIVING: Home  AMB STATUS: Ambulatory  Past Medical History:  Diagnosis Date  . Anemia   . Chronic right hip pain   . ESRD (end stage renal disease) on dialysis [facility])    dialysis M/W/F- [facility] (01/06/2017)  . GERD (gastroesophageal reflux disease)   . Gout   . High cholesterol   . Hypertension   . Neuropathy   . Osteoarthritis    "all over" (01/06/2017)  . Pleurisy   . Pneumonia 1980s X 1   Past Surgical History:  Procedure Laterality Date  . A/V FISTULAGRAM Left 12/24/2016   Procedure: A/V FISTULAGRAM;  Surgeon: Waynetta Sandy, MD;  Location: [place] [facility];  Service: Cardiovascular;  Laterality: Left;  . APPENDECTOMY  1963  . AV FISTULA PLACEMENT Left 08/04/2016   Procedure: ARTERIOVENOUS (AV) FISTULA CREATION/LEFT ARM;  Surgeon: Serafina Mitchell, MD;  [facility];  Service: Vascular;  Laterality: Left;  . AV FISTULA REPAIR Left 01/06/2017   arm  .  BILATERAL CARPAL TUNNEL RELEASE Bilateral 2006-2007   right-left  . CHOLECYSTECTOMY OPEN  1979  . COLONOSCOPY    . FINGER SURGERY Right 2006   "thumb; took out bone that conects to wrist and a piece of tendon; weaved tendon back in for mobility"  . FISTULA SUPERFICIALIZATION Left 09/24/2016   Procedure: FISTULA SUPERFICIALIZATION- LEFT ARM;  Surgeon: Serafina Mitchell, MD;  Location: [place];  Service: Vascular;  Laterality: Left;  . HAMMER TOE SURGERY Left 1981  . INSERTION OF DIALYSIS CATHETER Left 08/04/2016   Procedure: INSERTION OF DIALYSIS CATHETER;  Surgeon: Serafina Mitchell, MD;  Location: [place];  Service: Vascular;  Laterality: Left;  . JOINT REPLACEMENT    . LIGATION OF ARTERIOVENOUS  FISTULA Left 01/21/2017   Procedure: REPAIR OF ARTERIOVENOUS  FISTULA;  Surgeon: Angelia Mould, MD;  Location: [place];  Service: Vascular;  Laterality: Left;  . LUMBAR LAMINECTOMY/DECOMPRESSION MICRODISCECTOMY N/A 08/18/2012   Procedure: LUMBAR LAMINECTOMY/DECOMPRESSION MICRODISCECTOMY 3 LEVELS;  Surgeon: Hosie Spangle, MD;  Location: [place] [facility];  Service: Neurosurgery;  Laterality: N/A;  Lumbar Two through Five Laminectomy  . REVISON OF ARTERIOVENOUS FISTULA Left 01/06/2017   Procedure: REVISON OF ARTERIOVENOUS FISTULA LEFT ARM;  Surgeon: Serafina Mitchell, MD;  Location: [place];  Service: Vascular;  Laterality: Left;  . SHOULDER OPEN ROTATOR CUFF REPAIR Right 2001  . THROMBECTOMY  AND REVISION OF ARTERIOVENTOUS (AV) GORETEX  GRAFT Left 01/17/2017   Procedure: REVISION OF ARTERIOVENTOUS (AV) FISTULA LEFT ARM;  Surgeon: Waynetta Sandy, MD;  Location: [place];  Service: Vascular;  Laterality: Left;  . TOTAL HIP ARTHROPLASTY Right 07/26/2013   Procedure: TOTAL HIP ARTHROPLASTY;  Surgeon: Kerin Salen, MD;  Location: [place];  Service: Orthopedics;  Laterality: Right;  . TOTAL KNEE ARTHROPLASTY Bilateral 2008-2010   right-left  . WRIST TENDON TRANSFER Bilateral 1972-1973   left-right     Social History   Socioeconomic History  . Marital status: Married    Spouse name: Not on file  . Number of children: Not on file  . Years of education: Not on file  . Highest education level: Not on file  Social Needs  . Financial resource strain: Not on file  . Food insecurity - worry: Not on file  . Food insecurity - inability: Not on file  . Transportation needs - medical: Not on file  . Transportation needs - non-medical: Not on file  Occupational History  . Not on file  Tobacco Use  . Smoking status: Former Smoker    Packs/day: 1.00    Years: 10.00    Pack years: 10.00    Types: Cigarettes    Last attempt to quit: 08/13/1982    Years since quitting: 34.6  . Smokeless tobacco: Former Systems developer    Types: Chew  Substance and Sexual Activity  . Alcohol use: Yes    Comment: 01/06/2017 "used to be a social drinker; don't drink at all since ~ 2000"  . Drug use: No  . Sexual activity: Yes  Other Topics Concern  . Not on file  Social History Narrative  . Not on file    Allergies  Allergen Reactions  . Cozaar [Losartan Potassium] Other (See Comments)    Stopped due to kidney decline 4/18  . Kayexalate [Polystyrene] Other (See Comments)    Abnormal ekg  . Altace [Ramipril] Cough    Chronic cough  . Other Swelling    "Symvisc"   . Cholestyramine Light Other (See Comments)    UNSPECIFIED REACTION d  . Sulfa Antibiotics Rash    Current Outpatient Medications on File Prior to Visit  Medication Sig Dispense Refill  . acetaminophen (TYLENOL) 650 MG CR tablet Take 650 mg by mouth every 8 (eight) hours as needed for pain.    . calcium acetate (PHOSLO) 667 MG capsule Take 1 capsule (667 mg total) by mouth 3 (three) times daily with meals. 90 capsule 0  . cetirizine (ZYRTEC) 10 MG tablet Take 10 mg by mouth at bedtime.    . febuxostat (ULORIC) 40 MG tablet Take 1 tablet (40 mg total) by mouth daily. (Patient taking differently: Take 40 mg by mouth every evening. ) 90 tablet 1   . folic acid-vitamin b complex-vitamin c-selenium-zinc (DIALYVITE) 3 MG TABS tablet Take 1 tablet by mouth every evening.     . gabapentin (NEURONTIN) 300 MG capsule Take 300 mg by mouth at bedtime.     Marland Kitchen ibuprofen (ADVIL,MOTRIN) 200 MG tablet Take 400 mg by mouth every 6 (six) hours as needed for mild pain.     . midodrine (PROAMATINE) 10 MG tablet Take 10 mg by mouth every Monday, Wednesday, and Friday. 30 minutes before dialysis    . ranitidine (ZANTAC) 150 MG tablet Take 150 mg by mouth every morning.     . simvastatin (ZOCOR) 20 MG tablet Take 20 mg by mouth every evening.     Marland Kitchen  cephALEXin (KEFLEX) 500 MG capsule Take 1 capsule (500 mg total) by mouth 4 (four) times daily. (Patient not taking: Reported on 03/11/2017) 20 capsule 0  . colchicine 0.6 MG tablet Take 0.5 tablets (0.3 mg total) by mouth daily as needed (for gout flareups). (Patient not taking: Reported on 03/11/2017)    . ferric citrate (AURYXIA) 1 GM 210 MG(Fe) tablet Take 210 mg by mouth 3 (three) times daily with meals.     . hydrALAZINE (APRESOLINE) 50 MG tablet Take 50 mg by mouth 3 (three) times daily. Only if BP above 150    . oxyCODONE-acetaminophen (ROXICET) 5-325 MG tablet Take 1 tablet by mouth every 6 (six) hours as needed for severe pain. (Patient not taking: Reported on 03/11/2017) 10 tablet 0   No current facility-administered medications on file prior to visit.      Physical Examination Vitals:   03/11/17 0858 03/11/17 0909  BP: (!) 89/59 (!) 84/57  Pulse: 74   Resp: 16   Temp: 98.4 F (36.9 C)   TempSrc: Oral   SpO2: 93%   Weight: (!) 314 lb (142.4 kg)   Height: 5\' 11"  (1.803 m)    Body mass index is 43.79 kg/m.  Left upper arm incision has healed, 2 small scabs remain that are contracting, no signs of infection, no drainage, hand grip is 5/5, sensation in digits is intact, palpable thrill. Left radial pulse is 1+ palpable.   Medical Decision Making  Lakai Moree is a 67 y.o. year old male who  presents s/p repair of left upper arm AV fistula and closure of the wound on 01-21-17 by Dr. Scot Dock. He had undergone repair of his fistula previously by Dr. Donzetta Matters on 01-17-17 and then closure of the wound.  Remaining sutures removed. Incision is completely healed, 2 small contracting scabs remain.   Continue Bacitracin ointment on scabs until sloughed, with protective light gauze dressing.  Do not access site where scabs were for about 2 weeks after scabs have sloughed off.  Follow up as needed.   Thank you for allowing [place] to participate in this patient's care.  Clemon Chambers, RN, MSN, FNP-C [facility] [place] Office: [phone]  03/11/2017, 9:13 AM  Clinic MD: Oneida Alar

## 2017-03-11 NOTE — Progress Notes (Signed)
Vitals:   03/11/17 0858  BP: (!) 89/59  Pulse: 74  Resp: 16  Temp: 98.4 F (36.9 C)  TempSrc: Oral  SpO2: 93%  Weight: (!) 314 lb (142.4 kg)  Height: 5\' 11"  (1.803 m)

## 2017-04-30 ENCOUNTER — Other Ambulatory Visit: Payer: Self-pay | Admitting: Rheumatology

## 2017-04-30 NOTE — Telephone Encounter (Signed)
Yes, he is on dialysis.

## 2017-04-30 NOTE — Telephone Encounter (Signed)
Last Visit:11/17/16 Next Visit: 05/20/17 Labs: 01/21/17 Creat. 8.09 GFR 6 Previously Creat. 9.12 GFR 5  Okay to refill Uloric?

## 2017-05-07 NOTE — Progress Notes (Signed)
Office Visit Note  Patient: Richard Bush             Date of Birth: 1950-09-01           MRN: [id number]             PCP: Quentin Cornwall, MD Referring: Quentin Cornwall, MD Visit Date: 05/20/2017 Occupation: @GUAROCC @    Subjective:  Follow-up (6 MO F/U SHOULDER AND FEET PAIN, STILL HAVING RIGHT HIP PAIN )   History of Present Illness: Richard Bush is a 67 y.o. male with history of gout and osteoarthritis.  He has not had a gout flare in a long time.  He continues to have pain and discomfort in his bilateral hands and bilateral feet.  His right hip which has been replaced distal causes discomfort.  He has been having discomfort in his shoulders.  He gets dialysis 3 times a week.  Activities of Daily Living:  Patient reports morning stiffness for 5 minute.   Patient Reports nocturnal pain.  Difficulty dressing/grooming: Denies Difficulty climbing stairs: Reports Difficulty getting out of chair: Denies Difficulty using hands for taps, buttons, cutlery, and/or writing: Reports   Review of Systems  Constitutional: Positive for fatigue. Negative for night sweats.  HENT: Negative for ear pain, mouth sores, mouth dryness and nose dryness.   Eyes: Negative for pain, redness and dryness.  Respiratory: Negative for cough, shortness of breath and difficulty breathing.   Cardiovascular: Negative for chest pain, palpitations, hypertension, irregular heartbeat and swelling in legs/feet.  Gastrointestinal: Negative for constipation and diarrhea.  Endocrine: Negative for increased urination.  Genitourinary: Negative for difficulty urinating.  Musculoskeletal: Positive for arthralgias, joint pain and muscle weakness. Negative for joint swelling, myalgias, morning stiffness, muscle tenderness and myalgias.  Skin: Negative for color change, rash, hair loss, nodules/bumps, skin tightness, ulcers and sensitivity to sunlight.  Allergic/Immunologic: Negative for susceptible to infections.  Neurological:  Negative for dizziness, fainting, numbness, headaches, memory loss, night sweats and weakness.  Hematological: Negative for bruising/bleeding tendency and swollen glands.  Psychiatric/Behavioral: Positive for sleep disturbance. Negative for depressed mood. The patient is not nervous/anxious.     PMFS History:  Patient Active Problem List   Diagnosis Date Noted  . ESRD (end stage renal disease) ([place]) 01/21/2017  . Orthostatic dizziness   . Bleeding pseudoaneurysm of left brachiocephalic AV fistula, initial encounter ([place]) 01/05/2017  . Pressure injury of skin 08/04/2016  . 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 Anemia secondary to renal failure 08/04/2016  . Pulmonary nodule 08/03/2016  . ESRD on dialysis ([place]) 08/03/2016  . Right bundle branch block 08/03/2016  . History of gastroesophageal reflux (GERD) 07/13/2016  . Idiopathic chronic gout without tophus 07/03/2016  . Primary osteoarthritis of left knee 07/03/2016  . Stage 4 chronic kidney disease ([place]) 07/03/2016  . Osteoarthritis of hand 07/03/2016  . Hypertension 07/03/2016  . Secondary hyperparathyroidism ([place]) 07/03/2016  . Primary osteoarthritis of both feet 07/03/2016  . History of total hip replacement, right 07/03/2016  . Hx of total knee replacement, bilateral 07/03/2016    Past Medical History:  Diagnosis Date  . Anemia   . Chronic right hip pain   . ESRD (end stage renal disease) on dialysis [facility])    dialysis M/W/F- [facility] (01/06/2017)  . GERD (gastroesophageal reflux disease)   . Gout   . High cholesterol   . Hypertension   . Neuropathy   . Osteoarthritis    "all over" (01/06/2017)  . Pleurisy   .  Pneumonia 1980s X 1    Family History  Problem Relation Age of Onset  . Heart disease Mother   . Cancer Father    Past Surgical History:  Procedure Laterality Date  . A/V FISTULAGRAM Left 12/24/2016   Procedure: A/V FISTULAGRAM;  Surgeon: Waynetta Sandy, MD;  Location:  [facility];  Service: Cardiovascular;  Laterality: Left;  . APPENDECTOMY  1963  . AV FISTULA PLACEMENT Left 08/04/2016   Procedure: ARTERIOVENOUS (AV) FISTULA CREATION/LEFT ARM;  Surgeon: Serafina Mitchell, MD;  Location: [facility];  Service: Vascular;  Laterality: Left;  . AV FISTULA REPAIR Left 01/06/2017   arm  . BILATERAL CARPAL TUNNEL RELEASE Bilateral 2006-2007   right-left  . CHOLECYSTECTOMY OPEN  1979  . COLONOSCOPY    . FINGER SURGERY Right 2006   "thumb; took out bone that conects to wrist and a piece of tendon; weaved tendon back in for mobility"  . FISTULA SUPERFICIALIZATION Left 09/24/2016   Procedure: FISTULA SUPERFICIALIZATION- LEFT ARM;  Surgeon: Serafina Mitchell, MD;  Location: [facility];  Service: Vascular;  Laterality: Left;  . HAMMER TOE SURGERY Left 1981  . INSERTION OF DIALYSIS CATHETER Left 08/04/2016   Procedure: INSERTION OF DIALYSIS CATHETER;  Surgeon: Serafina Mitchell, MD;  Location: [facility];  Service: Vascular;  Laterality: Left;  . JOINT REPLACEMENT    . LIGATION OF ARTERIOVENOUS  FISTULA Left 01/21/2017   Procedure: REPAIR OF ARTERIOVENOUS  FISTULA;  Surgeon: Angelia Mould, MD;  Location: [facility];  Service: Vascular;  Laterality: Left;  . LUMBAR LAMINECTOMY/DECOMPRESSION MICRODISCECTOMY N/A 08/18/2012   Procedure: LUMBAR LAMINECTOMY/DECOMPRESSION MICRODISCECTOMY 3 LEVELS;  Surgeon: Hosie Spangle, MD;  Location: [facility];  Service: Neurosurgery;  Laterality: N/A;  Lumbar Two through Five Laminectomy  . REVISON OF ARTERIOVENOUS FISTULA Left 01/06/2017   Procedure: REVISON OF ARTERIOVENOUS FISTULA LEFT ARM;  Surgeon: Serafina Mitchell, MD;  Location: [facility];  Service: Vascular;  Laterality: Left;  . SHOULDER OPEN ROTATOR CUFF REPAIR Right 2001  . THROMBECTOMY AND REVISION OF ARTERIOVENTOUS (AV) GORETEX  GRAFT Left 01/17/2017   Procedure: REVISION OF ARTERIOVENTOUS (AV) FISTULA LEFT ARM;  Surgeon: Waynetta Sandy, MD;  Location: [facility];  Service:  Vascular;  Laterality: Left;  . TOTAL HIP ARTHROPLASTY Right 07/26/2013   Procedure: TOTAL HIP ARTHROPLASTY;  Surgeon: Kerin Salen, MD;  Location: [facility];  Service: Orthopedics;  Laterality: Right;  . TOTAL KNEE ARTHROPLASTY Bilateral 2008-2010   right-left  . WRIST TENDON TRANSFER Bilateral 1972-1973   left-right   Social History   Social History Narrative  . Not on file     Objective: Vital Signs: BP (!) 103/52 (BP Location: Right Arm, Patient Position: Sitting, Cuff Size: Normal)   Pulse 68   Resp 18   Ht 5' 10.5" (1.791 m)   Wt (!) 322 lb (146.1 kg)   BMI 45.55 kg/m    Physical Exam  Constitutional: He is oriented to person, place, and time. He appears well-developed and well-nourished.  HENT:  Head: Normocephalic and atraumatic.  Eyes: Pupils are equal, round, and reactive to light. Conjunctivae and EOM are normal.  Neck: Normal range of motion. Neck supple.  Cardiovascular: Normal rate, regular rhythm and normal heart sounds.  Pulmonary/Chest: Effort normal and breath sounds normal.  Abdominal: Soft. Bowel sounds are normal.  Neurological: He is alert and oriented to person, place, and time.  Skin: Skin is warm and dry. Capillary refill takes less than 2 seconds.  Psychiatric:  He has a normal mood and affect. His behavior is normal.  Nursing note and vitals reviewed.    Musculoskeletal Exam: C-spine thoracic lumbar spine good range of motion.  He had discomfort range of motion of bilateral shoulders but shoulder joint range of motion is full.  Elbow joints wrist joints with good range of motion.  He had right Opal surgery in the past.  He has DIP PIP and CMC thickening with incomplete fist formation.  He had right total hip replacement with discomfort on range of motion.  He has bilateral total knee replacement with some warmth.  CDAI Exam: No CDAI exam completed.    Investigation: No additional findings.Uric acid: 05/13/17 2.8 CBC Latest Ref Rng & Units 05/13/2017  01/21/2017 01/17/2017  WBC 3.4 - 10.8 x10E3/uL 5.8 6.0 -  Hemoglobin 13.0 - 17.7 g/dL 10.6(L) 10.3(L) 10.2(L)  Hematocrit 37.5 - 51.0 % 31.5(L) 32.3(L) 30.0(L)  Platelets 150 - 379 x10E3/uL 195 246 -   CMP Latest Ref Rng & Units 05/13/2017 01/21/2017 01/17/2017  Glucose 65 - 99 mg/dL 92 90 86  BUN 8 - 27 mg/dL 37(H) 53(H) -  Creatinine 0.76 - 1.27 mg/dL 7.87(H) 8.09(H) -  Sodium 134 - 144 mmol/L 143 140 142  Potassium 3.5 - 5.2 mmol/L 5.4(H) 4.3 5.6(H)  Chloride 96 - 106 mmol/L 93(L) 94(L) -  CO2 20 - 29 mmol/L 27 29 -  Calcium 8.6 - 10.2 mg/dL 9.1 9.1 -  Total Protein 6.0 - 8.5 g/dL 6.9 - -  Total Bilirubin 0.0 - 1.2 mg/dL 0.2 - -  Alkaline Phos 39 - 117 IU/L 59 - -  AST 0 - 40 IU/L 26 - -  ALT 0 - 44 IU/L 23 - -    Imaging: No results found.  Speciality Comments: No specialty comments available.    Procedures:  No procedures performed Allergies: Cozaar [losartan potassium]; Kayexalate [polystyrene]; Altace [ramipril]; Other; Cholestyramine light; and Sulfa antibiotics   Assessment / Plan:     Visit Diagnoses: Chronic gout due to renal impairment of multiple sites without tophus - Uloric 40 mg p.o. daily, colchicine as needed which she has not taken in a long time.  Uric acid: March 2019 his uric acid was 2.8.  He has not had a gout flare in a long time.  He does not want to switch to allopurinol as he felt he had an adequate response to allopurinol in the past.  Association of heart disease with Uloric was discussed.  We discussed reducing the dose of Uloric  to 40 mg half a tablet every day.  We will repeat uric acid level in 2 months.  Primary osteoarthritis of both hands: Joint protection muscle strengthening was discussed.  Primary osteoarthritis of both feet: Proper fitting shoes were discussed.  History of total hip replacement, right: He has chronic pain. Hx of total knee replacement, bilateral  History of renal disease - (ESRD): He is on dialysis now   Secondary  renal hyperparathyroidism ([place])  History of gastroesophageal reflux (GERD)    Orders: Orders Placed This Encounter  Procedures  . Uric acid   No orders of the defined types were placed in this encounter.   Face-to-face time spent with patient was 25 minutes.  Greater than 50% of time was spent in counseling and coordination of care.  Follow-Up Instructions: Return in about 6 months (around 11/19/2017) for Gout, Osteoarthritis.   Bo Merino, MD  Note - This record has been created using Editor, commissioning.  Chart creation  errors have been sought, but may not always  have been located. Such creation errors do not reflect on  the standard of medical care.

## 2017-05-13 ENCOUNTER — Telehealth: Payer: Self-pay | Admitting: Rheumatology

## 2017-05-13 ENCOUNTER — Other Ambulatory Visit: Payer: Self-pay | Admitting: *Deleted

## 2017-05-13 DIAGNOSIS — Z79899 Other long term (current) drug therapy: Secondary | ICD-10-CM

## 2017-05-13 DIAGNOSIS — Z5181 Encounter for therapeutic drug level monitoring: Secondary | ICD-10-CM

## 2017-05-13 DIAGNOSIS — M1A09X0 Idiopathic chronic gout, multiple sites, without tophus (tophi): Secondary | ICD-10-CM

## 2017-05-13 NOTE — Telephone Encounter (Signed)
[name] advised lab orders have been released and [facility] has them per [employer].

## 2017-05-13 NOTE — Telephone Encounter (Signed)
Lab orders released.  

## 2017-05-13 NOTE — Telephone Encounter (Signed)
Prudencio Pair from [facility] in [place] called stating that Stephane is at their facility to get his labwork done and they don't have any orders.  [facility] phone [phone]

## 2017-05-13 NOTE — Telephone Encounter (Signed)
Patient going to [facility] in [place] this pm for labs. Please release orders. Phone# [phone].

## 2017-05-14 LAB — CMP14+EGFR
ALT: 23 IU/L (ref 0–44)
AST: 26 IU/L (ref 0–40)
Albumin/Globulin Ratio: 1.9 (ref 1.2–2.2)
Albumin: 4.5 g/dL (ref 3.6–4.8)
Alkaline Phosphatase: 59 IU/L (ref 39–117)
BILIRUBIN TOTAL: 0.2 mg/dL (ref 0.0–1.2)
BUN / CREAT RATIO: 5 — AB (ref 10–24)
BUN: 37 mg/dL — ABNORMAL HIGH (ref 8–27)
CHLORIDE: 93 mmol/L — AB (ref 96–106)
CO2: 27 mmol/L (ref 20–29)
Calcium: 9.1 mg/dL (ref 8.6–10.2)
Creatinine, Ser: 7.87 mg/dL — ABNORMAL HIGH (ref 0.76–1.27)
GFR, EST AFRICAN AMERICAN: 7 mL/min/{1.73_m2} — AB (ref >59)
GFR, EST NON AFRICAN AMERICAN: 6 mL/min/{1.73_m2} — AB (ref >59)
GLOBULIN, TOTAL: 2.4 g/dL (ref 1.5–4.5)
Glucose: 92 mg/dL (ref 65–99)
Potassium: 5.4 mmol/L — ABNORMAL HIGH (ref 3.5–5.2)
Sodium: 143 mmol/L (ref 134–144)
TOTAL PROTEIN: 6.9 g/dL (ref 6.0–8.5)

## 2017-05-14 LAB — CBC WITH DIFFERENTIAL/PLATELET
BASOS: 0 %
Basophils Absolute: 0 10*3/uL (ref 0.0–0.2)
EOS (ABSOLUTE): 0.3 10*3/uL (ref 0.0–0.4)
EOS: 5 %
HEMATOCRIT: 31.5 % — AB (ref 37.5–51.0)
Hemoglobin: 10.6 g/dL — ABNORMAL LOW (ref 13.0–17.7)
IMMATURE GRANS (ABS): 0 10*3/uL (ref 0.0–0.1)
Immature Granulocytes: 0 %
Lymphocytes Absolute: 1.9 10*3/uL (ref 0.7–3.1)
Lymphs: 33 %
MCH: 33 pg (ref 26.6–33.0)
MCHC: 33.7 g/dL (ref 31.5–35.7)
MCV: 98 fL — AB (ref 79–97)
MONOS ABS: 0.4 10*3/uL (ref 0.1–0.9)
Monocytes: 7 %
NEUTROS ABS: 3.2 10*3/uL (ref 1.4–7.0)
Neutrophils: 55 %
PLATELETS: 195 10*3/uL (ref 150–379)
RBC: 3.21 x10E6/uL — ABNORMAL LOW (ref 4.14–5.80)
RDW: 15 % (ref 12.3–15.4)
WBC: 5.8 10*3/uL (ref 3.4–10.8)

## 2017-05-14 LAB — URIC ACID: Uric Acid: 2.8 mg/dL — ABNORMAL LOW (ref 3.7–8.6)

## 2017-05-14 NOTE — Progress Notes (Signed)
Labs are stable. Will discuss at fu

## 2017-05-20 ENCOUNTER — Ambulatory Visit: Payer: Medicare Other | Admitting: Rheumatology

## 2017-05-20 ENCOUNTER — Encounter: Payer: Self-pay | Admitting: Rheumatology

## 2017-05-20 VITALS — BP 103/52 | HR 68 | Resp 18 | Ht 70.5 in | Wt 322.0 lb

## 2017-05-20 DIAGNOSIS — M19041 Primary osteoarthritis, right hand: Secondary | ICD-10-CM

## 2017-05-20 DIAGNOSIS — M19042 Primary osteoarthritis, left hand: Secondary | ICD-10-CM | POA: Diagnosis not present

## 2017-05-20 DIAGNOSIS — N2581 Secondary hyperparathyroidism of renal origin: Secondary | ICD-10-CM

## 2017-05-20 DIAGNOSIS — Z87448 Personal history of other diseases of urinary system: Secondary | ICD-10-CM | POA: Diagnosis not present

## 2017-05-20 DIAGNOSIS — Z96653 Presence of artificial knee joint, bilateral: Secondary | ICD-10-CM

## 2017-05-20 DIAGNOSIS — M19072 Primary osteoarthritis, left ankle and foot: Secondary | ICD-10-CM

## 2017-05-20 DIAGNOSIS — M19071 Primary osteoarthritis, right ankle and foot: Secondary | ICD-10-CM

## 2017-05-20 DIAGNOSIS — Z96641 Presence of right artificial hip joint: Secondary | ICD-10-CM

## 2017-05-20 DIAGNOSIS — M1A39X0 Chronic gout due to renal impairment, multiple sites, without tophus (tophi): Secondary | ICD-10-CM | POA: Diagnosis not present

## 2017-05-20 DIAGNOSIS — Z8719 Personal history of other diseases of the digestive system: Secondary | ICD-10-CM

## 2017-07-16 ENCOUNTER — Telehealth: Payer: Self-pay | Admitting: Rheumatology

## 2017-07-16 DIAGNOSIS — Z79899 Other long term (current) drug therapy: Secondary | ICD-10-CM

## 2017-07-16 DIAGNOSIS — M1A39X0 Chronic gout due to renal impairment, multiple sites, without tophus (tophi): Secondary | ICD-10-CM

## 2017-07-16 NOTE — Telephone Encounter (Signed)
Lab orders released.  

## 2017-07-16 NOTE — Telephone Encounter (Signed)
Patients wife calling to request lab orders to be sent to [facility]. Patient going Monday. Please call patient with questions or concerns.  FAX# [phone].

## 2017-07-21 ENCOUNTER — Other Ambulatory Visit: Payer: Self-pay | Admitting: Orthopedic Surgery

## 2017-07-21 DIAGNOSIS — M25551 Pain in right hip: Secondary | ICD-10-CM

## 2017-07-21 LAB — CBC WITH DIFFERENTIAL/PLATELET
BASOS ABS: 0 10*3/uL (ref 0.0–0.2)
Basos: 1 %
EOS (ABSOLUTE): 0.2 10*3/uL (ref 0.0–0.4)
Eos: 4 %
HEMOGLOBIN: 11.8 g/dL — AB (ref 13.0–17.7)
Hematocrit: 36.2 % — ABNORMAL LOW (ref 37.5–51.0)
IMMATURE GRANS (ABS): 0 10*3/uL (ref 0.0–0.1)
IMMATURE GRANULOCYTES: 0 %
Lymphocytes Absolute: 1.9 10*3/uL (ref 0.7–3.1)
Lymphs: 37 %
MCH: 33.5 pg — AB (ref 26.6–33.0)
MCHC: 32.6 g/dL (ref 31.5–35.7)
MCV: 103 fL — ABNORMAL HIGH (ref 79–97)
MONOCYTES: 6 %
Monocytes Absolute: 0.3 10*3/uL (ref 0.1–0.9)
NEUTROS PCT: 52 %
Neutrophils Absolute: 2.8 10*3/uL (ref 1.4–7.0)
Platelets: 252 10*3/uL (ref 150–450)
RBC: 3.52 x10E6/uL — AB (ref 4.14–5.80)
RDW: 15.5 % — ABNORMAL HIGH (ref 12.3–15.4)
WBC: 5.2 10*3/uL (ref 3.4–10.8)

## 2017-07-21 LAB — CMP14+EGFR
A/G RATIO: 1.5 (ref 1.2–2.2)
ALT: 20 IU/L (ref 0–44)
AST: 14 IU/L (ref 0–40)
Albumin: 4.8 g/dL (ref 3.6–4.8)
Alkaline Phosphatase: 48 IU/L (ref 39–117)
BILIRUBIN TOTAL: 0.3 mg/dL (ref 0.0–1.2)
BUN/Creatinine Ratio: 5 — ABNORMAL LOW (ref 10–24)
BUN: 48 mg/dL — AB (ref 8–27)
CALCIUM: 9.7 mg/dL (ref 8.6–10.2)
CHLORIDE: 92 mmol/L — AB (ref 96–106)
CO2: 24 mmol/L (ref 20–29)
Creatinine, Ser: 9.28 mg/dL — ABNORMAL HIGH (ref 0.76–1.27)
GFR calc Af Amer: 6 mL/min/{1.73_m2} — ABNORMAL LOW (ref >59)
GFR calc non Af Amer: 5 mL/min/{1.73_m2} — ABNORMAL LOW (ref >59)
GLUCOSE: 98 mg/dL (ref 65–99)
Globulin, Total: 3.2 g/dL (ref 1.5–4.5)
POTASSIUM: 5 mmol/L (ref 3.5–5.2)
Sodium: 140 mmol/L (ref 134–144)
Total Protein: 8 g/dL (ref 6.0–8.5)

## 2017-07-21 LAB — URIC ACID: URIC ACID: 3.1 mg/dL — AB (ref 3.7–8.6)

## 2017-07-21 NOTE — Telephone Encounter (Signed)
GFR is low but stable.  Please forward labs to his nephrologist.

## 2017-07-25 ENCOUNTER — Ambulatory Visit
Admission: RE | Admit: 2017-07-25 | Discharge: 2017-07-25 | Disposition: A | Discharge status: Home / Self Care | Payer: Medicare Other | Source: Ambulatory Visit | Attending: Orthopedic Surgery | Admitting: Orthopedic Surgery

## 2017-07-25 DIAGNOSIS — M25551 Pain in right hip: Secondary | ICD-10-CM

## 2017-07-26 ENCOUNTER — Telehealth: Payer: Self-pay | Admitting: Rheumatology

## 2017-07-26 NOTE — Telephone Encounter (Signed)
Patient called stating he was returning your call regarding his labwork.

## 2017-07-26 NOTE — Telephone Encounter (Signed)
Patient advised of lab results and copy sent to Dr. Florene Glen.

## 2017-11-05 NOTE — Progress Notes (Signed)
Office Visit Note  Patient: Richard Bush             Date of Birth: Jun 18, 1950           MRN: [id number]             PCP: Quentin Cornwall, MD Referring: Quentin Cornwall, MD Visit Date: 11/18/2017 Occupation: @GUAROCC @  Subjective:  Pain in multiple joints   History of Present Illness: Richard Bush is a 67 y.o. male with history of gout and osteoarthritis.  He is taking Uloric 20 mg by mouth daily.  He continues to go to dialysis 3 times a week.  He denies any recent gout flares.  He reports his uric acid has been more stable since starting on dialysis.  He reports he has been having increased bilateral shoulder pain.  He states the pain is most severe in his left shoulder.  He states the pain is been waking him up at night.  He states he has discomfort with range of motion.  He states he is also been having increased stiffness and discomfort in his neck.  He has noticed some pain in his left thumb as well as decreased grip strength.  He reports of bilateral knee replacements are doing well.  He states that he is having increased lower back pain.  He states the pain is most severe in the left side.  He states he followed up with Dr. Sherwood Gambler for further evaluation, and he plans on postponing surgery as long as he can.    Activities of Daily Living:  Patient reports morning stiffness for 1-2  minutes.   Patient Reports nocturnal pain.  Difficulty dressing/grooming: Denies Difficulty climbing stairs: Reports Difficulty getting out of chair: Denies Difficulty using hands for taps, buttons, cutlery, and/or writing: Reports  Review of Systems  Constitutional: Positive for fatigue. Negative for night sweats.  HENT: Negative for mouth sores, trouble swallowing, trouble swallowing, mouth dryness and nose dryness.   Eyes: Positive for dryness. Negative for redness and visual disturbance.  Respiratory: Negative for cough, hemoptysis, shortness of breath and difficulty breathing.   Cardiovascular:  Negative for chest pain, palpitations, hypertension, irregular heartbeat and swelling in legs/feet.  Gastrointestinal: Negative for blood in stool, constipation and diarrhea.  Endocrine: Negative for increased urination.  Genitourinary: Negative for painful urination.  Musculoskeletal: Positive for arthralgias, joint pain and morning stiffness. Negative for joint swelling, myalgias, muscle weakness, muscle tenderness and myalgias.  Skin: Negative for color change, rash, hair loss, nodules/bumps, skin tightness, ulcers and sensitivity to sunlight.  Allergic/Immunologic: Negative for susceptible to infections.  Neurological: Negative for dizziness, fainting, memory loss, night sweats and weakness.  Hematological: Negative for swollen glands.  Psychiatric/Behavioral: Negative for depressed mood and sleep disturbance. The patient is not nervous/anxious.     PMFS History:  Patient Active Problem List   Diagnosis Date Noted  . ESRD (end stage renal disease) ([place]) 01/21/2017  . Orthostatic dizziness   . Bleeding pseudoaneurysm of left brachiocephalic AV fistula, initial encounter ([place]) 01/05/2017  . Pressure injury of skin 08/04/2016  . Secondary renal hyperparathyroidism ([place]) 08/04/2016  . Anemia secondary to renal failure 08/04/2016  . Pulmonary nodule 08/03/2016  . ESRD on dialysis ([place]) 08/03/2016  . Right bundle branch block 08/03/2016  . History of gastroesophageal reflux (GERD) 07/13/2016  . Idiopathic chronic gout without tophus 07/03/2016  . Primary osteoarthritis of left knee 07/03/2016  . Stage 4 chronic kidney disease ([place]) 07/03/2016  . Osteoarthritis of hand 07/03/2016  .  Hypertension 07/03/2016  . Secondary hyperparathyroidism ([place]) 07/03/2016  . Primary osteoarthritis of both feet 07/03/2016  . History of total hip replacement, right 07/03/2016  . Hx of total knee replacement, bilateral 07/03/2016    Past Medical History:  Diagnosis Date  . Anemia   . Chronic right hip  pain   . ESRD (end stage renal disease) on dialysis [facility])    dialysis M/W/F- [facility] (01/06/2017)  . GERD (gastroesophageal reflux disease)   . Gout   . High cholesterol   . Hypertension   . Neuropathy   . Osteoarthritis    "all over" (01/06/2017)  . Pleurisy   . Pneumonia 1980s X 1    Family History  Problem Relation Age of Onset  . Heart disease Mother   . Cancer Father    Past Surgical History:  Procedure Laterality Date  . A/V FISTULAGRAM Left 12/24/2016   Procedure: A/V FISTULAGRAM;  Surgeon: Waynetta Sandy, MD;  Location: [place] [facility];  Service: Cardiovascular;  Laterality: Left;  . APPENDECTOMY  1963  . AV FISTULA PLACEMENT Left 08/04/2016   Procedure: ARTERIOVENOUS (AV) FISTULA CREATION/LEFT ARM;  Surgeon: Serafina Mitchell, MD;  Location: [facility];  Service: Vascular;  Laterality: Left;  . AV FISTULA REPAIR Left 01/06/2017   arm  . BILATERAL CARPAL TUNNEL RELEASE Bilateral 2006-2007   right-left  . CHOLECYSTECTOMY OPEN  1979  . COLONOSCOPY    . FINGER SURGERY Right 2006   "thumb; took out bone that conects to wrist and a piece of tendon; weaved tendon back in for mobility"  . FISTULA SUPERFICIALIZATION Left 09/24/2016   Procedure: FISTULA SUPERFICIALIZATION- LEFT ARM;  Surgeon: Serafina Mitchell, MD;  Location: [place];  Service: Vascular;  Laterality: Left;  . HAMMER TOE SURGERY Left 1981  . INSERTION OF DIALYSIS CATHETER Left 08/04/2016   Procedure: INSERTION OF DIALYSIS CATHETER;  Surgeon: Serafina Mitchell, MD;  Location: [facility] [facility];  Service: Vascular;  Laterality: Left;  . JOINT REPLACEMENT    . LIGATION OF ARTERIOVENOUS  FISTULA Left 01/21/2017   Procedure: REPAIR OF ARTERIOVENOUS  FISTULA;  Surgeon: Angelia Mould, MD;  Location: [place];  Service: Vascular;  Laterality: Left;  . LUMBAR LAMINECTOMY/DECOMPRESSION MICRODISCECTOMY N/A 08/18/2012   Procedure: LUMBAR LAMINECTOMY/DECOMPRESSION MICRODISCECTOMY 3 LEVELS;  Surgeon: Hosie Spangle, MD;   Location: [place] [facility];  Service: Neurosurgery;  Laterality: N/A;  Lumbar Two through Five Laminectomy  . REVISON OF ARTERIOVENOUS FISTULA Left 01/06/2017   Procedure: REVISON OF ARTERIOVENOUS FISTULA LEFT ARM;  Surgeon: Serafina Mitchell, MD;  Location: [place];  Service: Vascular;  Laterality: Left;  . SHOULDER OPEN ROTATOR CUFF REPAIR Right 2001  . THROMBECTOMY AND REVISION OF ARTERIOVENTOUS (AV) GORETEX  GRAFT Left 01/17/2017   Procedure: REVISION OF ARTERIOVENTOUS (AV) FISTULA LEFT ARM;  Surgeon: Waynetta Sandy, MD;  Location: [place];  Service: Vascular;  Laterality: Left;  . TOTAL HIP ARTHROPLASTY Right 07/26/2013   Procedure: TOTAL HIP ARTHROPLASTY;  Surgeon: Kerin Salen, MD;  Location: [place];  Service: Orthopedics;  Laterality: Right;  . TOTAL KNEE ARTHROPLASTY Bilateral 2008-2010   right-left  . WRIST TENDON TRANSFER Bilateral 1972-1973   left-right   Social History   Social History Narrative  . Not on file    Objective: Vital Signs: BP (!) 92/59 (BP Location: Right Arm, Patient Position: Sitting, Cuff Size: Large)   Pulse 66   Resp 16   Ht 5\' 10"  (1.778 m)   Wt (!) 316 lb  3.2 oz (143.4 kg)   BMI 45.37 kg/m    Physical Exam  Constitutional: He is oriented to person, place, and time. He appears well-developed and well-nourished.  HENT:  Head: Normocephalic and atraumatic.  Eyes: Pupils are equal, round, and reactive to light. Conjunctivae and EOM are normal.  Neck: Normal range of motion. Neck supple.  Cardiovascular: Normal rate, regular rhythm and normal heart sounds.  Pulmonary/Chest: Effort normal and breath sounds normal.  Abdominal: Soft. Bowel sounds are normal.  Lymphadenopathy:    He has no cervical adenopathy.  Neurological: He is alert and oriented to person, place, and time.  Skin: Skin is warm and dry. Capillary refill takes less than 2 seconds.  Psychiatric: He has a normal mood and affect. His behavior is normal.  Nursing note and vitals  reviewed.    Musculoskeletal Exam: C-spine limited ROM.  Thoracic and lumbar spine limited ROM with discomfort. Painful ROM of bilateral shoulder joints. Elbow joints, wrist joints, MCPs, PIPs, and DIPs good ROM with no synovitis.  PIP and DIP synovial thickening. Tenderness of left 1st MCP joint. Right hip replacement uncomfortable with ROM. Bilateral knee replacements are doing well.  Good ROM with no warmth or effusion noted.   CDAI Exam: CDAI Score: Not documented Patient Global Assessment: Not documented; Provider Global Assessment: Not documented Swollen: Not documented; Tender: Not documented Joint Exam   Not documented   There is currently no information documented on the homunculus. Go to the Rheumatology activity and complete the homunculus joint exam.  Investigation: No additional findings.  Imaging: No results found.  Recent Labs: Lab Results  Component Value Date   WBC 5.9 11/11/2017   HGB 11.2 (L) 11/11/2017   PLT 206 11/11/2017   NA 139 11/11/2017   K 5.0 11/11/2017   CL 92 (L) 11/11/2017   CO2 26 11/11/2017   GLUCOSE 109 (H) 11/11/2017   BUN 49 (H) 11/11/2017   CREATININE 9.31 (H) 11/11/2017   BILITOT 0.4 11/11/2017   ALKPHOS 36 (L) 11/11/2017   AST 18 11/11/2017   ALT 19 11/11/2017   PROT 7.6 11/11/2017   ALBUMIN 4.6 11/11/2017   CALCIUM 11.0 (H) 11/11/2017   GFRAA 6 (L) 11/11/2017    Speciality Comments: No specialty comments available.  Procedures:  Large Joint Inj: L subacromial bursa on 11/18/2017 11:40 AM Indications: pain Details: 27 G 1.5 in needle, posterior approach  Arthrogram: No  Medications: 1 mL lidocaine 1 %; 30 mg triamcinolone acetonide 40 MG/ML Aspirate: 0 mL Outcome: tolerated well, no immediate complications Procedure, treatment alternatives, risks and benefits explained, specific risks discussed. Consent was given by the patient. Immediately prior to procedure a time out was called to verify the correct patient, procedure,  equipment, support staff and site/side marked as required. Patient was prepped and draped in the usual sterile fashion.     Allergies: Cozaar [losartan potassium]; Kayexalate [polystyrene]; Altace [ramipril]; Other; Cholestyramine light; and Sulfa antibiotics   Assessment / Plan:     Visit Diagnoses: Chronic gout due to renal impairment of multiple sites without tophus -she has not had any recent gout flares.  He has been taking Uloric 20 mg by mouth daily.  His uric acid level have improved significantly since being on dialysis 3 days weekly.  His uric acid was 3.9 on 11/11/2017.  He was advised to continue taking Uloric 20 mg by mouth daily.  He does not need any refills at this time.  He was advised to notify us if he develops  any signs or symptoms of a flare.  We will continue to monitor his uric acid levels every 6 months.  He will follow-up in the office in 6 months.  Primary osteoarthritis of both hands: He has PIP and DIP synovial thickening consistent with osteoarthritis of bilateral hands.  He has tenderness of the left first MCP joint.  No synovitis is noted.  He is complete fist formation bilaterally.  He has noticed decreased grip strength.  Joint protection and muscle strengthening were discussed.  Primary osteoarthritis of both feet: He has no discomfort in his feet at this time.  He wears proper fitting shoes.  Chronic left shoulder pain -He has been having increased discomfort in his left shoulder joint.  The pain is been keeping him up at night.  He has discomfort with range of motion.  An x-ray of the left shoulder was obtained today.  He previously saw a shoulder specialist in [place] who has since moved practices.  Plan: XR Shoulder Left.  The x-ray was unremarkable except for some calcification in this acromioclavicular joint.  After informed consent was obtained the acromial clavicular joint was injected with cortisone which he tolerated well. a handout on shoulder joint exercises  was given.  History of total hip replacement, right: Slight limited range of motion with discomfort.  He followed up with Dr. Mayer Camel for second opinion.  He had no MRI that showed good placement of the prostetic according to the patient.  Hx of total knee replacement, bilateral: Doing well.  No warmth or effusion.  He has good range of motion.  He has no discomfort in his knee joints at this time.  Other medical conditions are listed as follows:  Secondary renal hyperparathyroidism ([place])  History of renal disease  History of gastroesophageal reflux (GERD)  Orders: Orders Placed This Encounter  Procedures  . XR Shoulder Left   No orders of the defined types were placed in this encounter.   Face-to-face time spent with patient was 30 minutes. Greater than 50% of time was spent in counseling and coordination of care.  Follow-Up Instructions: Return in about 6 months (around 05/20/2018) for Gout, Osteoarthritis.   Ofilia Neas, PA-C   I examined and evaluated the patient with Richard Sams PA.  Scout appears to be very well controlled.  He will continue with Uloric 20 mg every day.  He is not having a lot of discomfort in his right shoulder.  For which she will see an orthopedic surgeon.  For left shoulder joint discomfort the x-ray was obtained which was unremarkable except for calcification in the acromioclavicular joint.  After informed consent was obtained and different treatment options were discussed left acromioclavicular joint was injected with cortisone as described above.  He tolerated the procedure well.  I have encouraged him to do exercises.  I also advised him to contact us in case he has ongoing symptoms then we can refer him to physical therapy.  The plan of care was discussed as noted above.  Bo Merino, MD  Note - This record has been created using Editor, commissioning.  Chart creation errors have been sought, but may not always  have been located. Such creation errors  do not reflect on  the standard of medical care.

## 2017-11-11 ENCOUNTER — Telehealth: Payer: Self-pay | Admitting: Rheumatology

## 2017-11-11 ENCOUNTER — Other Ambulatory Visit: Payer: Self-pay | Admitting: *Deleted

## 2017-11-11 DIAGNOSIS — Z79899 Other long term (current) drug therapy: Secondary | ICD-10-CM

## 2017-11-11 DIAGNOSIS — M1A39X0 Chronic gout due to renal impairment, multiple sites, without tophus (tophi): Secondary | ICD-10-CM

## 2017-11-11 NOTE — Telephone Encounter (Signed)
Patient called requesting his labwork orders be sent to [facility] in [place].  Patient will be going this afternoon.

## 2017-11-11 NOTE — Telephone Encounter (Signed)
Lab orders released.  

## 2017-11-12 LAB — CMP14+EGFR
A/G RATIO: 1.5 (ref 1.2–2.2)
ALT: 19 IU/L (ref 0–44)
AST: 18 IU/L (ref 0–40)
Albumin: 4.6 g/dL (ref 3.6–4.8)
Alkaline Phosphatase: 36 IU/L — ABNORMAL LOW (ref 39–117)
BUN/Creatinine Ratio: 5 — ABNORMAL LOW (ref 10–24)
BUN: 49 mg/dL — AB (ref 8–27)
Bilirubin Total: 0.4 mg/dL (ref 0.0–1.2)
CALCIUM: 11 mg/dL — AB (ref 8.6–10.2)
CO2: 26 mmol/L (ref 20–29)
CREATININE: 9.31 mg/dL — AB (ref 0.76–1.27)
Chloride: 92 mmol/L — ABNORMAL LOW (ref 96–106)
GFR, EST AFRICAN AMERICAN: 6 mL/min/{1.73_m2} — AB (ref >59)
GFR, EST NON AFRICAN AMERICAN: 5 mL/min/{1.73_m2} — AB (ref >59)
GLUCOSE: 109 mg/dL — AB (ref 65–99)
Globulin, Total: 3 g/dL (ref 1.5–4.5)
POTASSIUM: 5 mmol/L (ref 3.5–5.2)
Sodium: 139 mmol/L (ref 134–144)
TOTAL PROTEIN: 7.6 g/dL (ref 6.0–8.5)

## 2017-11-12 LAB — CBC WITH DIFFERENTIAL/PLATELET
BASOS: 1 %
Basophils Absolute: 0 10*3/uL (ref 0.0–0.2)
EOS (ABSOLUTE): 0.2 10*3/uL (ref 0.0–0.4)
EOS: 3 %
HEMATOCRIT: 32.2 % — AB (ref 37.5–51.0)
Hemoglobin: 11.2 g/dL — ABNORMAL LOW (ref 13.0–17.7)
IMMATURE GRANULOCYTES: 0 %
Immature Grans (Abs): 0 10*3/uL (ref 0.0–0.1)
LYMPHS: 32 %
Lymphocytes Absolute: 1.9 10*3/uL (ref 0.7–3.1)
MCH: 34.1 pg — ABNORMAL HIGH (ref 26.6–33.0)
MCHC: 34.8 g/dL (ref 31.5–35.7)
MCV: 98 fL — ABNORMAL HIGH (ref 79–97)
MONOS ABS: 0.5 10*3/uL (ref 0.1–0.9)
Monocytes: 9 %
NEUTROS PCT: 55 %
Neutrophils Absolute: 3.3 10*3/uL (ref 1.4–7.0)
PLATELETS: 206 10*3/uL (ref 150–450)
RBC: 3.28 x10E6/uL — AB (ref 4.14–5.80)
RDW: 13.9 % (ref 12.3–15.4)
WBC: 5.9 10*3/uL (ref 3.4–10.8)

## 2017-11-12 LAB — URIC ACID: Uric Acid: 3.9 mg/dL (ref 3.7–8.6)

## 2017-11-12 NOTE — Progress Notes (Signed)
Forward labs to his PCP and nephrologist.

## 2017-11-15 ENCOUNTER — Telehealth: Payer: Self-pay | Admitting: Rheumatology

## 2017-11-15 NOTE — Telephone Encounter (Signed)
Patient's wife called stating she was returning a call regarding Richard Bush's bloodwork.

## 2017-11-15 NOTE — Telephone Encounter (Signed)
Spoke with [name] and reviewed Richard Bush's lab results with her. She states he has a follow up appointment with [name] on 11/18/17. She states they would like to discuss his Uloric dose at that appointment.

## 2017-11-18 ENCOUNTER — Encounter: Payer: Self-pay | Admitting: Rheumatology

## 2017-11-18 ENCOUNTER — Ambulatory Visit (INDEPENDENT_AMBULATORY_CARE_PROVIDER_SITE_OTHER): Payer: Self-pay

## 2017-11-18 ENCOUNTER — Ambulatory Visit: Payer: Medicare Other | Admitting: Rheumatology

## 2017-11-18 VITALS — BP 92/59 | HR 66 | Resp 16 | Ht 70.0 in | Wt 316.2 lb

## 2017-11-18 DIAGNOSIS — G8929 Other chronic pain: Secondary | ICD-10-CM | POA: Diagnosis not present

## 2017-11-18 DIAGNOSIS — Z8719 Personal history of other diseases of the digestive system: Secondary | ICD-10-CM

## 2017-11-18 DIAGNOSIS — M25512 Pain in left shoulder: Secondary | ICD-10-CM | POA: Diagnosis not present

## 2017-11-18 DIAGNOSIS — M19071 Primary osteoarthritis, right ankle and foot: Secondary | ICD-10-CM

## 2017-11-18 DIAGNOSIS — M19042 Primary osteoarthritis, left hand: Secondary | ICD-10-CM

## 2017-11-18 DIAGNOSIS — M1A39X0 Chronic gout due to renal impairment, multiple sites, without tophus (tophi): Secondary | ICD-10-CM | POA: Diagnosis not present

## 2017-11-18 DIAGNOSIS — M19072 Primary osteoarthritis, left ankle and foot: Secondary | ICD-10-CM

## 2017-11-18 DIAGNOSIS — Z96653 Presence of artificial knee joint, bilateral: Secondary | ICD-10-CM

## 2017-11-18 DIAGNOSIS — M19041 Primary osteoarthritis, right hand: Secondary | ICD-10-CM

## 2017-11-18 DIAGNOSIS — N2581 Secondary hyperparathyroidism of renal origin: Secondary | ICD-10-CM

## 2017-11-18 DIAGNOSIS — Z96641 Presence of right artificial hip joint: Secondary | ICD-10-CM | POA: Diagnosis not present

## 2017-11-18 DIAGNOSIS — Z87448 Personal history of other diseases of urinary system: Secondary | ICD-10-CM

## 2017-11-18 MED ORDER — TRIAMCINOLONE ACETONIDE 40 MG/ML IJ SUSP
30.0000 mg | INTRAMUSCULAR | Status: AC | PRN
  Administered 2017-11-18: 30 mg via INTRA_ARTICULAR

## 2017-11-18 MED ORDER — LIDOCAINE HCL 1 % IJ SOLN
1.0000 mL | INTRAMUSCULAR | Status: AC | PRN
  Administered 2017-11-18: 1 mL

## 2017-11-18 NOTE — Patient Instructions (Signed)
Shoulder Exercises Ask your health care provider which exercises are safe for you. Do exercises exactly as told by your health care provider and adjust them as directed. It is normal to feel mild stretching, pulling, tightness, or discomfort as you do these exercises, but you should stop right away if you feel sudden pain or your pain gets worse.Do not begin these exercises until told by your health care provider. RANGE OF MOTION EXERCISES These exercises warm up your muscles and joints and improve the movement and flexibility of your shoulder. These exercises also help to relieve pain, numbness, and tingling. These exercises involve stretching your injured shoulder directly. Exercise A: Pendulum  1. Stand near a wall or a surface that you can hold onto for balance. 2. Bend at the waist and let your left / right arm hang straight down. Use your other arm to support you. Keep your back straight and do not lock your knees. 3. Relax your left / right arm and shoulder muscles, and move your hips and your trunk so your left / right arm swings freely. Your arm should swing because of the motion of your body, not because you are using your arm or shoulder muscles. 4. Keep moving your body so your arm swings in the following directions, as told by your health care provider: ? Side to side. ? Forward and backward. ? In clockwise and counterclockwise circles. 5. Continue each motion for __________ seconds, or for as long as told by your health care provider. 6. Slowly return to the starting position. Repeat __________ times. Complete this exercise __________ times a day. Exercise B:Flexion, Standing  1. Stand and hold a broomstick, a cane, or a similar object. Place your hands a little more than shoulder-width apart on the object. Your left / right hand should be palm-up, and your other hand should be palm-down. 2. Keep your elbow straight and keep your shoulder muscles relaxed. Push the stick down with  your healthy arm to raise your left / right arm in front of your body, and then over your head until you feel a stretch in your shoulder. ? Avoid shrugging your shoulder while you raise your arm. Keep your shoulder blade tucked down toward the middle of your back. 3. Hold for __________ seconds. 4. Slowly return to the starting position. Repeat __________ times. Complete this exercise __________ times a day. Exercise C: Abduction, Standing 1. Stand and hold a broomstick, a cane, or a similar object. Place your hands a little more than shoulder-width apart on the object. Your left / right hand should be palm-up, and your other hand should be palm-down. 2. While keeping your elbow straight and your shoulder muscles relaxed, push the stick across your body toward your left / right side. Raise your left / right arm to the side of your body and then over your head until you feel a stretch in your shoulder. ? Do not raise your arm above shoulder height, unless your health care provider tells you to do that. ? Avoid shrugging your shoulder while you raise your arm. Keep your shoulder blade tucked down toward the middle of your back. 3. Hold for __________ seconds. 4. Slowly return to the starting position. Repeat __________ times. Complete this exercise __________ times a day. Exercise D:Internal Rotation  1. Place your left / right hand behind your back, palm-up. 2. Use your other hand to dangle an exercise band, a towel, or a similar object over your shoulder. Grasp the band with   your left / right hand so you are holding onto both ends. 3. Gently pull up on the band until you feel a stretch in the front of your left / right shoulder. ? Avoid shrugging your shoulder while you raise your arm. Keep your shoulder blade tucked down toward the middle of your back. 4. Hold for __________ seconds. 5. Release the stretch by letting go of the band and lowering your hands. Repeat __________ times. Complete  this exercise __________ times a day. STRETCHING EXERCISES These exercises warm up your muscles and joints and improve the movement and flexibility of your shoulder. These exercises also help to relieve pain, numbness, and tingling. These exercises are done using your healthy shoulder to help stretch the muscles of your injured shoulder. Exercise E: Corner Stretch (External Rotation and Abduction)  1. Stand in a doorway with one of your feet slightly in front of the other. This is called a staggered stance. If you cannot reach your forearms to the door frame, stand facing a corner of a room. 2. Choose one of the following positions as told by your health care provider: ? Place your hands and forearms on the door frame above your head. ? Place your hands and forearms on the door frame at the height of your head. ? Place your hands on the door frame at the height of your elbows. 3. Slowly move your weight onto your front foot until you feel a stretch across your chest and in the front of your shoulders. Keep your head and chest upright and keep your abdominal muscles tight. 4. Hold for __________ seconds. 5. To release the stretch, shift your weight to your back foot. Repeat __________ times. Complete this stretch __________ times a day. Exercise F:Extension, Standing 1. Stand and hold a broomstick, a cane, or a similar object behind your back. ? Your hands should be a little wider than shoulder-width apart. ? Your palms should face away from your back. 2. Keeping your elbows straight and keeping your shoulder muscles relaxed, move the stick away from your body until you feel a stretch in your shoulder. ? Avoid shrugging your shoulders while you move the stick. Keep your shoulder blade tucked down toward the middle of your back. 3. Hold for __________ seconds. 4. Slowly return to the starting position. Repeat __________ times. Complete this exercise __________ times a day. STRENGTHENING  EXERCISES These exercises build strength and endurance in your shoulder. Endurance is the ability to use your muscles for a long time, even after they get tired. Exercise G:External Rotation  1. Sit in a stable chair without armrests. 2. Secure an exercise band at elbow height on your left / right side. 3. Place a soft object, such as a folded towel or a small pillow, between your left / right upper arm and your body to move your elbow a few inches away (about 10 cm) from your side. 4. Hold the end of the band so it is tight and there is no slack. 5. Keeping your elbow pressed against the soft object, move your left / right forearm out, away from your abdomen. Keep your body steady so only your forearm moves. 6. Hold for __________ seconds. 7. Slowly return to the starting position. Repeat __________ times. Complete this exercise __________ times a day. Exercise H:Shoulder Abduction  1. Sit in a stable chair without armrests, or stand. 2. Hold a __________ weight in your left / right hand, or hold an exercise band with both hands.   3. Start with your arms straight down and your left / right palm facing in, toward your body. 4. Slowly lift your left / right hand out to your side. Do not lift your hand above shoulder height unless your health care provider tells you that this is safe. ? Keep your arms straight. ? Avoid shrugging your shoulder while you do this movement. Keep your shoulder blade tucked down toward the middle of your back. 5. Hold for __________ seconds. 6. Slowly lower your arm, and return to the starting position. Repeat __________ times. Complete this exercise __________ times a day. Exercise I:Shoulder Extension 1. Sit in a stable chair without armrests, or stand. 2. Secure an exercise band to a stable object in front of you where it is at shoulder height. 3. Hold one end of the exercise band in each hand. Your palms should face each other. 4. Straighten your elbows and  lift your hands up to shoulder height. 5. Step back, away from the secured end of the exercise band, until the band is tight and there is no slack. 6. Squeeze your shoulder blades together as you pull your hands down to the sides of your thighs. Stop when your hands are straight down by your sides. Do not let your hands go behind your body. 7. Hold for __________ seconds. 8. Slowly return to the starting position. Repeat __________ times. Complete this exercise __________ times a day. Exercise J:Standing Shoulder Row 1. Sit in a stable chair without armrests, or stand. 2. Secure an exercise band to a stable object in front of you so it is at waist height. 3. Hold one end of the exercise band in each hand. Your palms should be in a thumbs-up position. 4. Bend each of your elbows to an "L" shape (about 90 degrees) and keep your upper arms at your sides. 5. Step back until the band is tight and there is no slack. 6. Slowly pull your elbows back behind you. 7. Hold for __________ seconds. 8. Slowly return to the starting position. Repeat __________ times. Complete this exercise __________ times a day. Exercise K:Shoulder Press-Ups  1. Sit in a stable chair that has armrests. Sit upright, with your feet flat on the floor. 2. Put your hands on the armrests so your elbows are bent and your fingers are pointing forward. Your hands should be about even with the sides of your body. 3. Push down on the armrests and use your arms to lift yourself off of the chair. Straighten your elbows and lift yourself up as much as you comfortably can. ? Move your shoulder blades down, and avoid letting your shoulders move up toward your ears. ? Keep your feet on the ground. As you get stronger, your feet should support less of your body weight as you lift yourself up. 4. Hold for __________ seconds. 5. Slowly lower yourself back into the chair. Repeat __________ times. Complete this exercise __________ times a  day. Exercise L: Wall Push-Ups  1. Stand so you are facing a stable wall. Your feet should be about one arm-length away from the wall. 2. Lean forward and place your palms on the wall at shoulder height. 3. Keep your feet flat on the floor as you bend your elbows and lean forward toward the wall. 4. Hold for __________ seconds. 5. Straighten your elbows to push yourself back to the starting position. Repeat __________ times. Complete this exercise __________ times a day. This information is not intended to replace advice   given to you by your health care provider. Make sure you discuss any questions you have with your health care provider. Document Released: 12/17/2004 Document Revised: 10/28/2015 Document Reviewed: 10/14/2014 [employer] Interactive Patient Education  2018 [employer]  

## 2018-01-05 ENCOUNTER — Telehealth: Payer: Self-pay | Admitting: Rheumatology

## 2018-01-05 NOTE — Telephone Encounter (Signed)
Patient's wife Richard Bush called stating they received a letter from [employer] stating they will no longer cover the prescription of Uloric.  Richard Bush states the office needs to contact [employer] at [phone] or fill out a form at [email or web address] website.

## 2018-01-06 NOTE — Telephone Encounter (Signed)
Received a letter stating the insurance would not cover the Uloric without an explanation from the doctor. Patient will fax a copy of letter to the office so we may fill out necessary for to have Uloric covered.

## 2018-02-18 ENCOUNTER — Telehealth: Payer: Self-pay | Admitting: Rheumatology

## 2018-02-18 MED ORDER — FEBUXOSTAT 40 MG PO TABS: 20.0000 mg | ORAL_TABLET | Freq: Every day | ORAL | 0 refills | Status: DC

## 2018-02-18 NOTE — Telephone Encounter (Signed)
Patient's wife [name] called checking on the status of Richard Bush's prescription of Uloric.  [name] states that she spoke with [name] in November who had her fax over the letter from her insurance company for [name] to fill out to receive approval.  [name] requested a return call ASAP.

## 2018-02-18 NOTE — Telephone Encounter (Signed)
Last Visit: 11/18/17 Next Visit: 05/19/18 Labs: 11/11/17 GFR is low but stable.   Okay to refill per [name]   Patient's wife advised prescription has been sent in for the generic. Patient's wife advised to contact the pharmacy to see if the insurance will cover the generic. Patient will advise if the formulary exception is still needed.

## 2018-05-19 ENCOUNTER — Ambulatory Visit: Payer: Medicare Other | Admitting: Rheumatology

## 2018-06-29 ENCOUNTER — Telehealth: Payer: Self-pay | Admitting: *Deleted

## 2018-06-29 NOTE — Telephone Encounter (Signed)
Received a Prior Authorization request from [facility] for Febuxostat. Authorization has been submitted to patient's insurance via Cover My Meds. Will update once we receive a response.

## 2018-07-01 NOTE — Telephone Encounter (Signed)
Received a fax from [employer] regarding a prior authorization for Febuxostat. Authorization has been APPROVED from 03/31/2018 to 06/29/2019.   Will send document to scan center.

## 2018-07-20 NOTE — Progress Notes (Signed)
Office Visit Note  Patient: Richard Bush             Date of Birth: 1950-07-04           MRN: [id number]             PCP: Quentin Cornwall, MD Referring: Quentin Cornwall, MD Visit Date: 07/21/2018 Occupation: @GUAROCC @  Subjective:  Left shoulder joint pain   History of Present Illness: Richard Bush is a 68 y.o. male with history of gout and osteoarthritis.  He is taking Uloric 20 mg po daily and Colchicine 0.3 mg po prn for flares. He denies any recent gout flares.  He has not missed any doses of Uloric recently.   He reports he continues to have chronic left shoulder joint pain.  He has been have pain with ROM and pain at night in the left shoulder joint. He reports he has to keep his shoulder in a certain position while at dialysis, which exacerbates his left shoulder joint pain.  He has intermittent  pain in both hands.  He denies any joint swelling.   Activities of Daily Living:  Patient reports morning stiffness for 5-10 minutes.   Patient Reports nocturnal pain.  Difficulty dressing/grooming: Denies Difficulty climbing stairs: Denies Difficulty getting out of chair: Denies Difficulty using hands for taps, buttons, cutlery, and/or writing: Reports  Review of Systems  Constitutional: Positive for fatigue. Negative for night sweats.  HENT: Negative for mouth sores, mouth dryness and nose dryness.   Eyes: Negative for photophobia, redness, visual disturbance and dryness.  Respiratory: Negative for cough, hemoptysis, shortness of breath and difficulty breathing.   Cardiovascular: Negative for chest pain, palpitations, hypertension, irregular heartbeat and swelling in legs/feet.  Gastrointestinal: Negative for blood in stool, constipation and diarrhea.  Endocrine: Negative for increased urination.  Genitourinary: Negative for painful urination.  Musculoskeletal: Positive for arthralgias, joint pain and morning stiffness. Negative for joint swelling, myalgias, muscle weakness, muscle  tenderness and myalgias.  Skin: Negative for color change, rash, hair loss, nodules/bumps, skin tightness, ulcers and sensitivity to sunlight.  Allergic/Immunologic: Negative for susceptible to infections.  Neurological: Negative for dizziness, fainting, memory loss, night sweats and weakness.  Hematological: Negative for swollen glands.  Psychiatric/Behavioral: Negative for depressed mood and sleep disturbance. The patient is not nervous/anxious.     PMFS History:  Patient Active Problem List   Diagnosis Date Noted  . ESRD (end stage renal disease) ([place]) 01/21/2017  . Orthostatic dizziness   . Bleeding pseudoaneurysm of left brachiocephalic AV fistula, initial encounter ([place]) 01/05/2017  . Pressure injury of skin 08/04/2016  . Secondary renal hyperparathyroidism ([place]) 08/04/2016  . Anemia secondary to renal failure 08/04/2016  . Pulmonary nodule 08/03/2016  . ESRD on dialysis ([place]) 08/03/2016  . Right bundle branch block 08/03/2016  . History of gastroesophageal reflux (GERD) 07/13/2016  . Idiopathic chronic gout without tophus 07/03/2016  . Primary osteoarthritis of left knee 07/03/2016  . Stage 4 chronic kidney disease ([place]) 07/03/2016  . Osteoarthritis of hand 07/03/2016  . Hypertension 07/03/2016  . Secondary hyperparathyroidism ([place]) 07/03/2016  . Primary osteoarthritis of both feet 07/03/2016  . History of total hip replacement, right 07/03/2016  . Hx of total knee replacement, bilateral 07/03/2016    Past Medical History:  Diagnosis Date  . Anemia   . Chronic right hip pain   . ESRD (end stage renal disease) on dialysis [facility])    dialysis M/W/F- [facility] (01/06/2017)  . GERD (gastroesophageal reflux disease)   .  Gout   . High cholesterol   . Hypertension   . Neuropathy   . Osteoarthritis    "all over" (01/06/2017)  . Pleurisy   . Pneumonia 1980s X 1    Family History  Problem Relation Age of Onset  . Heart disease Mother   . Cancer Father    Past  Surgical History:  Procedure Laterality Date  . A/V FISTULAGRAM Left 12/24/2016   Procedure: A/V FISTULAGRAM;  Surgeon: Waynetta Sandy, MD;  Location: [place] [facility];  Service: Cardiovascular;  Laterality: Left;  . APPENDECTOMY  1963  . AV FISTULA PLACEMENT Left 08/04/2016   Procedure: ARTERIOVENOUS (AV) FISTULA CREATION/LEFT ARM;  Surgeon: Serafina Mitchell, MD;  Location: [facility];  Service: Vascular;  Laterality: Left;  . AV FISTULA REPAIR Left 01/06/2017   arm  . BILATERAL CARPAL TUNNEL RELEASE Bilateral 2006-2007   right-left  . CHOLECYSTECTOMY OPEN  1979  . COLONOSCOPY    . FINGER SURGERY Right 2006   "thumb; took out bone that conects to wrist and a piece of tendon; weaved tendon back in for mobility"  . FISTULA SUPERFICIALIZATION Left 09/24/2016   Procedure: FISTULA SUPERFICIALIZATION- LEFT ARM;  Surgeon: Serafina Mitchell, MD;  Location: [place];  Service: Vascular;  Laterality: Left;  . HAMMER TOE SURGERY Left 1981  . INSERTION OF DIALYSIS CATHETER Left 08/04/2016   Procedure: INSERTION OF DIALYSIS CATHETER;  Surgeon: Serafina Mitchell, MD;  Location: [place];  Service: Vascular;  Laterality: Left;  . JOINT REPLACEMENT    . LIGATION OF ARTERIOVENOUS  FISTULA Left 01/21/2017   Procedure: REPAIR OF ARTERIOVENOUS  FISTULA;  Surgeon: Angelia Mould, MD;  Location: [place];  Service: Vascular;  Laterality: Left;  . LUMBAR LAMINECTOMY/DECOMPRESSION MICRODISCECTOMY N/A 08/18/2012   Procedure: LUMBAR LAMINECTOMY/DECOMPRESSION MICRODISCECTOMY 3 LEVELS;  Surgeon: Hosie Spangle, MD;  Location: [place] [facility];  Service: Neurosurgery;  Laterality: N/A;  Lumbar Two through Five Laminectomy  . REVISON OF ARTERIOVENOUS FISTULA Left 01/06/2017   Procedure: REVISON OF ARTERIOVENOUS FISTULA LEFT ARM;  Surgeon: Serafina Mitchell, MD;  Location: [place];  Service: Vascular;  Laterality: Left;  . SHOULDER OPEN ROTATOR CUFF REPAIR Right 2001  . THROMBECTOMY AND REVISION OF ARTERIOVENTOUS (AV)  GORETEX  GRAFT Left 01/17/2017   Procedure: REVISION OF ARTERIOVENTOUS (AV) FISTULA LEFT ARM;  Surgeon: Waynetta Sandy, MD;  Location: [place];  Service: Vascular;  Laterality: Left;  . TOTAL HIP ARTHROPLASTY Right 07/26/2013   Procedure: TOTAL HIP ARTHROPLASTY;  Surgeon: Kerin Salen, MD;  Location: [place];  Service: Orthopedics;  Laterality: Right;  . TOTAL KNEE ARTHROPLASTY Bilateral 2008-2010   right-left  . WRIST TENDON TRANSFER Bilateral 1972-1973   left-right   Social History   Social History Narrative  . Not on file   Immunization History  Administered Date(s) Administered  . Pneumococcal Polysaccharide-23 08/06/2016     Objective: Vital Signs: BP (!) 126/59 (BP Location: Right Wrist, Patient Position: Sitting, Cuff Size: Normal)   Pulse 75   Resp 15   Ht 5' 10.5" (1.791 m)   Wt (!) 316 lb 3.2 oz (143.4 kg)   BMI 44.73 kg/m    Physical Exam Vitals signs and nursing note reviewed.  Constitutional:      Appearance: He is well-developed.  HENT:     Head: Normocephalic and atraumatic.  Eyes:     Conjunctiva/sclera: Conjunctivae normal.     Pupils: Pupils are equal, round, and reactive to light.  Neck:  Musculoskeletal: Normal range of motion and neck supple.  Cardiovascular:     Rate and Rhythm: Normal rate and regular rhythm.     Heart sounds: Normal heart sounds.  Pulmonary:     Effort: Pulmonary effort is normal.     Breath sounds: Normal breath sounds.  Abdominal:     General: Bowel sounds are normal.     Palpations: Abdomen is soft.  Lymphadenopathy:     Cervical: No cervical adenopathy.  Skin:    General: Skin is warm and dry.     Capillary Refill: Capillary refill takes less than 2 seconds.  Neurological:     Mental Status: He is alert and oriented to person, place, and time.  Psychiatric:        Behavior: Behavior normal.      Musculoskeletal Exam: C-spine, thoracic spine, and lumbar spine good ROM.  No midline spinal tenderness.  No  SI joint tenderness.  Shoulder joints, elbow joints, wrist joints, MCPs, PIPs, and DIPs good ROM with no synovitis.  PIP and DIP synovial thickening consistent with osteoarthritis. Synovial thickening of bilateral 2nd MCP joints. Bilateral knee replacements have good ROM with no discomfort.  No warmth or effusion of knee joints.  No tenderness or swelling of ankle joints.    CDAI Exam: CDAI Score: Not documented Patient Global Assessment: Not documented; Provider Global Assessment: Not documented Swollen: Not documented; Tender: Not documented Joint Exam   Not documented   There is currently no information documented on the homunculus. Go to the Rheumatology activity and complete the homunculus joint exam.  Investigation: No additional findings.  Imaging: No results found.  Recent Labs: Lab Results  Component Value Date   WBC 5.9 11/11/2017   HGB 11.2 (L) 11/11/2017   PLT 206 11/11/2017   NA 139 11/11/2017   K 5.0 11/11/2017   CL 92 (L) 11/11/2017   CO2 26 11/11/2017   GLUCOSE 109 (H) 11/11/2017   BUN 49 (H) 11/11/2017   CREATININE 9.31 (H) 11/11/2017   BILITOT 0.4 11/11/2017   ALKPHOS 36 (L) 11/11/2017   AST 18 11/11/2017   ALT 19 11/11/2017   PROT 7.6 11/11/2017   ALBUMIN 4.6 11/11/2017   CALCIUM 11.0 (H) 11/11/2017   GFRAA 6 (L) 11/11/2017    Speciality Comments: No specialty comments available.  Procedures:  Large Joint Inj: L glenohumeral on 07/21/2018 9:41 AM Indications: pain Details: 27 G 1.5 in needle, posterior approach  Arthrogram: No  Medications: 1.5 mL lidocaine 1 %; 40 mg triamcinolone acetonide 40 MG/ML Aspirate: 0 mL Outcome: tolerated well, no immediate complications Procedure, treatment alternatives, risks and benefits explained, specific risks discussed. Consent was given by the patient. Immediately prior to procedure a time out was called to verify the correct patient, procedure, equipment, support staff and site/side marked as required. Patient  was prepped and draped in the usual sterile fashion.     Allergies: Cozaar [losartan potassium]; Kayexalate [polystyrene]; Altace [ramipril]; Other; Cholestyramine light; and Sulfa antibiotics   Assessment / Plan:     Visit Diagnoses: Chronic gout due to renal impairment of multiple sites without tophus -He has not had any recent gout flares.  He is clinically doing well on Uloric 20 mg po daily and colchicine 0.3 mg po dailly prn.  Uric acid level was 3.9 on 11/11/2017.  CBC, CMP, uric acid will be checked today.  He will continue on the current treatment regimen.  He does not need any refills at this time.  He was advised  to notify us if he develops any signs or symptoms of a gout flare.  He will follow up in 6 months.- Plan: Uric acid  Chronic left shoulder pain: He has chronic left shoulder joint pain.  He has good range of motion with some discomfort with internal rotation. He has dialysis 3 days a week and has a fistula present.  He has to keep his left arm in a stationary position for about 4 hours, which exacerbates his left shoulder pain and stiffness. He had a left shoulder x-ray on 11/18/2017 that revealed type II acromion with calcification around the [facility] joint.  He had a subacromial injection on 11/18/2017 and provided temporary relief.   The left glenohumeral joint was injected with cortisone today.   Primary osteoarthritis of both hands: He has PIP and DIP synovial thickening consistent with osteoarthritis of bilateral hands.  Is been having increased discomfort and stiffness in both hands recently.  No synovitis was noted on exam today.  He does have synovial thickening of bilateral second MCP joints.  X-rays of both hands were obtained today.  Pain in both hands -He has been having increased pain in both hands.  He has no joint swelling.  He has synovial thickening of bilateral second MCP joints.  He has PIP and DIP synovial thickening.  He has complete fist formation bilaterally.  X-rays  of both hands were obtained today.  We will also check RF and CCP.  He was advised to notify us if he develops increased joint pain or joint swelling.  He has  Plan: XR Hand 2 View Left, XR Hand 2 View Right  Medication monitoring encounter -Future orders for CBC and CMP were placed today. Plan: CBC with Differential/Platelet, COMPLETE METABOLIC PANEL WITH GFR   Primary osteoarthritis of both feet: He has no feet pain or joint swelling at this time.  He wears proper fitting shoes.   History of total hip replacement, right: Doing well.  He has good ROM with no discomfort.   Hx of total knee replacement, bilateral: Doing well.  He has good ROM bilaterally with no discomfort.  No warmth or effusion noted.   Other medical conditions are listed as follows:   Secondary renal hyperparathyroidism ([place])  History of renal disease  History of gastroesophageal reflux (GERD)    Orders: Orders Placed This Encounter  Procedures  . Large Joint Inj  . XR Hand 2 View Left  . XR Hand 2 View Right  . Uric acid  . CBC with Differential/Platelet  . COMPLETE METABOLIC PANEL WITH GFR   No orders of the defined types were placed in this encounter.   Face-to-face time spent with patient was 30 minutes. Greater than 50% of time was spent in counseling and coordination of care.  Follow-Up Instructions: Return in about 6 months (around 01/20/2019) for Gout, Osteoarthritis.   Ofilia Neas, PA-C   I examined and evaluated the patient with Hazel Sams PA.  Patient continues to have some discomfort due to underlying osteoarthritis.  He has not had a major gout flare in a long time.  He had pain and discomfort in his bilateral hands and thickening over MCP joints.  The x-rays were consistent with osteoarthritis and gouty arthropathy overlap.  We will obtain some additional labs today.  The plan of care was discussed as noted above.  Bo Merino, MD Note - This record has been created using Radio producer.  Chart creation errors have been sought, but may not  always  have been located. Such creation errors do not reflect on  the standard of medical care.

## 2018-07-21 ENCOUNTER — Other Ambulatory Visit: Payer: Self-pay | Admitting: *Deleted

## 2018-07-21 ENCOUNTER — Ambulatory Visit: Payer: Medicare Other | Admitting: Rheumatology

## 2018-07-21 ENCOUNTER — Ambulatory Visit: Payer: Self-pay

## 2018-07-21 ENCOUNTER — Ambulatory Visit (INDEPENDENT_AMBULATORY_CARE_PROVIDER_SITE_OTHER): Payer: Medicare Other

## 2018-07-21 ENCOUNTER — Encounter: Payer: Self-pay | Admitting: Rheumatology

## 2018-07-21 ENCOUNTER — Other Ambulatory Visit: Payer: Self-pay

## 2018-07-21 VITALS — BP 126/59 | HR 75 | Resp 15 | Ht 70.5 in | Wt 316.2 lb

## 2018-07-21 DIAGNOSIS — M19041 Primary osteoarthritis, right hand: Secondary | ICD-10-CM

## 2018-07-21 DIAGNOSIS — Z96653 Presence of artificial knee joint, bilateral: Secondary | ICD-10-CM

## 2018-07-21 DIAGNOSIS — M79641 Pain in right hand: Secondary | ICD-10-CM | POA: Diagnosis not present

## 2018-07-21 DIAGNOSIS — M1A39X0 Chronic gout due to renal impairment, multiple sites, without tophus (tophi): Secondary | ICD-10-CM | POA: Diagnosis not present

## 2018-07-21 DIAGNOSIS — M79642 Pain in left hand: Secondary | ICD-10-CM | POA: Diagnosis not present

## 2018-07-21 DIAGNOSIS — M25512 Pain in left shoulder: Secondary | ICD-10-CM | POA: Diagnosis not present

## 2018-07-21 DIAGNOSIS — Z5181 Encounter for therapeutic drug level monitoring: Secondary | ICD-10-CM

## 2018-07-21 DIAGNOSIS — M19072 Primary osteoarthritis, left ankle and foot: Secondary | ICD-10-CM

## 2018-07-21 DIAGNOSIS — G8929 Other chronic pain: Secondary | ICD-10-CM

## 2018-07-21 DIAGNOSIS — Z87448 Personal history of other diseases of urinary system: Secondary | ICD-10-CM

## 2018-07-21 DIAGNOSIS — N2581 Secondary hyperparathyroidism of renal origin: Secondary | ICD-10-CM

## 2018-07-21 DIAGNOSIS — M19071 Primary osteoarthritis, right ankle and foot: Secondary | ICD-10-CM

## 2018-07-21 DIAGNOSIS — Z96641 Presence of right artificial hip joint: Secondary | ICD-10-CM

## 2018-07-21 DIAGNOSIS — Z8719 Personal history of other diseases of the digestive system: Secondary | ICD-10-CM

## 2018-07-21 DIAGNOSIS — Z79899 Other long term (current) drug therapy: Secondary | ICD-10-CM

## 2018-07-21 DIAGNOSIS — M19042 Primary osteoarthritis, left hand: Secondary | ICD-10-CM

## 2018-07-21 MED ORDER — LIDOCAINE HCL 1 % IJ SOLN
1.5000 mL | INTRAMUSCULAR | Status: AC | PRN
  Administered 2018-07-21: 1.5 mL

## 2018-07-21 MED ORDER — TRIAMCINOLONE ACETONIDE 40 MG/ML IJ SUSP
40.0000 mg | INTRAMUSCULAR | Status: AC | PRN
  Administered 2018-07-21: 40 mg via INTRA_ARTICULAR

## 2018-07-22 LAB — CBC WITH DIFFERENTIAL/PLATELET
Basophils Absolute: 0 10*3/uL (ref 0.0–0.2)
Basos: 1 %
EOS (ABSOLUTE): 0.2 10*3/uL (ref 0.0–0.4)
Eos: 3 %
Hematocrit: 31.7 % — ABNORMAL LOW (ref 37.5–51.0)
Hemoglobin: 11 g/dL — ABNORMAL LOW (ref 13.0–17.7)
Immature Grans (Abs): 0 10*3/uL (ref 0.0–0.1)
Immature Granulocytes: 0 %
Lymphocytes Absolute: 1.3 10*3/uL (ref 0.7–3.1)
Lymphs: 19 %
MCH: 33.4 pg — ABNORMAL HIGH (ref 26.6–33.0)
MCHC: 34.7 g/dL (ref 31.5–35.7)
MCV: 96 fL (ref 79–97)
Monocytes Absolute: 0.6 10*3/uL (ref 0.1–0.9)
Monocytes: 8 %
Neutrophils Absolute: 4.8 10*3/uL (ref 1.4–7.0)
Neutrophils: 69 %
Platelets: 262 10*3/uL (ref 150–450)
RBC: 3.29 x10E6/uL — ABNORMAL LOW (ref 4.14–5.80)
RDW: 12.7 % (ref 11.6–15.4)
WBC: 6.9 10*3/uL (ref 3.4–10.8)

## 2018-07-22 LAB — CMP14+EGFR
ALT: 31 IU/L (ref 0–44)
AST: 27 IU/L (ref 0–40)
Albumin/Globulin Ratio: 1.7 (ref 1.2–2.2)
Albumin: 4.7 g/dL (ref 3.8–4.8)
Alkaline Phosphatase: 77 IU/L (ref 39–117)
BUN/Creatinine Ratio: 7 — ABNORMAL LOW (ref 10–24)
BUN: 53 mg/dL — ABNORMAL HIGH (ref 8–27)
Bilirubin Total: 0.3 mg/dL (ref 0.0–1.2)
CO2: 20 mmol/L (ref 20–29)
Calcium: 10 mg/dL (ref 8.6–10.2)
Chloride: 96 mmol/L (ref 96–106)
Creatinine, Ser: 8.14 mg/dL — ABNORMAL HIGH (ref 0.76–1.27)
GFR calc Af Amer: 7 mL/min/{1.73_m2} — ABNORMAL LOW (ref >59)
GFR calc non Af Amer: 6 mL/min/{1.73_m2} — ABNORMAL LOW (ref >59)
Globulin, Total: 2.7 g/dL (ref 1.5–4.5)
Glucose: 114 mg/dL — ABNORMAL HIGH (ref 65–99)
Potassium: 5.2 mmol/L (ref 3.5–5.2)
Sodium: 140 mmol/L (ref 134–144)
Total Protein: 7.4 g/dL (ref 6.0–8.5)

## 2018-07-22 LAB — URIC ACID: Uric Acid: 4.4 mg/dL (ref 3.7–8.6)

## 2018-07-22 NOTE — Progress Notes (Signed)
Uric acid is within desirable range.  Anemia is stable.  We will continue to monitor.

## 2018-07-23 LAB — RHEUMATOID FACTOR: Rhuematoid fact SerPl-aCnc: <10 IU/mL (ref 0.0–13.9)

## 2018-07-23 LAB — CYCLIC CITRUL PEPTIDE ANTIBODY, IGG/IGA: Cyclic Citrullin Peptide Ab: 5 units (ref 0–19)

## 2018-11-03 ENCOUNTER — Other Ambulatory Visit: Payer: Self-pay | Admitting: Rheumatology

## 2018-11-03 NOTE — Telephone Encounter (Signed)
Last Visit: 07/21/18 Next Visit: 01/19/19 Labs: 07/21/18 Glucose 114, BUN 53, Creat 8.14 GFR 6, RBC 3.29, Hgb 11.0, Hct 31.7 MCH 33.4  Okay to refill per [name]

## 2018-11-15 ENCOUNTER — Other Ambulatory Visit: Payer: Self-pay | Admitting: Rheumatology

## 2018-11-15 NOTE — Telephone Encounter (Signed)
Last Visit: 07/21/18 Next Visit: 01/19/19 Labs: 07/21/18 Uric acid is within desirable range.  Anemia is stable.  We will continue to monitor.   Okay to refill per [name]

## 2018-12-05 ENCOUNTER — Other Ambulatory Visit: Payer: Self-pay | Admitting: Nephrology

## 2018-12-05 ENCOUNTER — Other Ambulatory Visit (HOSPITAL_COMMUNITY): Payer: Self-pay | Admitting: Nephrology

## 2018-12-05 DIAGNOSIS — Z7682 Awaiting organ transplant status: Secondary | ICD-10-CM

## 2018-12-05 DIAGNOSIS — N184 Chronic kidney disease, stage 4 (severe): Secondary | ICD-10-CM

## 2018-12-06 ENCOUNTER — Encounter (INDEPENDENT_AMBULATORY_CARE_PROVIDER_SITE_OTHER): Payer: Self-pay | Admitting: *Deleted

## 2018-12-20 ENCOUNTER — Other Ambulatory Visit (HOSPITAL_COMMUNITY): Payer: Self-pay | Admitting: Internal Medicine

## 2018-12-20 ENCOUNTER — Other Ambulatory Visit (HOSPITAL_COMMUNITY): Payer: Self-pay | Admitting: Nephrology

## 2018-12-20 ENCOUNTER — Ambulatory Visit (HOSPITAL_COMMUNITY): Payer: Medicare Other

## 2018-12-20 DIAGNOSIS — Z005 Encounter for examination of potential donor of organ and tissue: Secondary | ICD-10-CM

## 2018-12-21 ENCOUNTER — Encounter: Payer: Self-pay | Admitting: Nurse Practitioner

## 2018-12-27 ENCOUNTER — Other Ambulatory Visit: Payer: Self-pay

## 2018-12-27 ENCOUNTER — Ambulatory Visit (HOSPITAL_COMMUNITY)
Admission: RE | Admit: 2018-12-27 | Discharge: 2018-12-27 | Disposition: A | Discharge status: Home / Self Care | Payer: Medicare Other | Source: Ambulatory Visit | Attending: Nephrology | Admitting: Nephrology

## 2018-12-27 DIAGNOSIS — Z7682 Awaiting organ transplant status: Secondary | ICD-10-CM

## 2018-12-27 DIAGNOSIS — N289 Disorder of kidney and ureter, unspecified: Secondary | ICD-10-CM | POA: Diagnosis not present

## 2018-12-27 DIAGNOSIS — I700 Atherosclerosis of aorta: Secondary | ICD-10-CM | POA: Insufficient documentation

## 2018-12-27 DIAGNOSIS — N184 Chronic kidney disease, stage 4 (severe): Secondary | ICD-10-CM | POA: Diagnosis present

## 2018-12-29 ENCOUNTER — Ambulatory Visit (HOSPITAL_COMMUNITY): Payer: Medicare Other | Attending: Cardiology

## 2018-12-29 ENCOUNTER — Other Ambulatory Visit: Payer: Self-pay

## 2018-12-29 DIAGNOSIS — Z005 Encounter for examination of potential donor of organ and tissue: Secondary | ICD-10-CM | POA: Diagnosis not present

## 2018-12-29 MED ORDER — PERFLUTREN LIPID MICROSPHERE
1.0000 mL | INTRAVENOUS | Status: AC | PRN
  Administered 2018-12-29: 2 mL via INTRAVENOUS

## 2019-01-02 ENCOUNTER — Telehealth (HOSPITAL_COMMUNITY): Payer: Self-pay

## 2019-01-02 NOTE — Telephone Encounter (Signed)
Spoke with the patient's wife and his instructions were given. She stated that he would be here for his test tomorrow. Asked to call back with any questions. S.Vianca Bracher EMTP

## 2019-01-03 ENCOUNTER — Ambulatory Visit: Payer: Medicare Other | Admitting: Nurse Practitioner

## 2019-01-03 ENCOUNTER — Ambulatory Visit (HOSPITAL_COMMUNITY): Payer: Medicare Other | Attending: Internal Medicine

## 2019-01-03 ENCOUNTER — Encounter: Payer: Self-pay | Admitting: Nurse Practitioner

## 2019-01-03 ENCOUNTER — Other Ambulatory Visit: Payer: Self-pay

## 2019-01-03 VITALS — BP 110/60 | HR 80 | Temp 98.3°F | Ht 70.5 in | Wt 317.0 lb

## 2019-01-03 DIAGNOSIS — Z005 Encounter for examination of potential donor of organ and tissue: Secondary | ICD-10-CM | POA: Diagnosis not present

## 2019-01-03 DIAGNOSIS — N186 End stage renal disease: Secondary | ICD-10-CM | POA: Insufficient documentation

## 2019-01-03 DIAGNOSIS — I120 Hypertensive chronic kidney disease with stage 5 chronic kidney disease or end stage renal disease: Secondary | ICD-10-CM | POA: Insufficient documentation

## 2019-01-03 DIAGNOSIS — I4510 Unspecified right bundle-branch block: Secondary | ICD-10-CM | POA: Diagnosis not present

## 2019-01-03 DIAGNOSIS — Z86010 Personal history of colonic polyps: Secondary | ICD-10-CM

## 2019-01-03 MED ORDER — REGADENOSON 0.4 MG/5ML IV SOLN
0.4000 mg | Freq: Once | INTRAVENOUS | Status: AC
  Administered 2019-01-03: 0.4 mg via INTRAVENOUS

## 2019-01-03 MED ORDER — TECHNETIUM TC 99M TETROFOSMIN IV KIT
32.6000 | PACK | Freq: Once | INTRAVENOUS | Status: AC | PRN
  Administered 2019-01-03: 32.6 via INTRAVENOUS
  Filled 2019-01-03: qty 33

## 2019-01-03 NOTE — Patient Instructions (Addendum)
If you are age 69 or older, your body mass index should be between 23-30. Your Body mass index is 44.84 kg/m. If this is out of the aforementioned range listed, please consider follow up with your Primary Care Provider.  If you are age 49 or younger, your body mass index should be between 19-25. Your Body mass index is 44.84 kg/m. If this is out of the aformentioned range listed, please consider follow up with your Primary Care Provider.   Please contact the office in a couple of weeks to schedule colonoscopy at [facility] with Dr. Carlean Purl for January 2021.  Thank you for choosing me and [facility].   Tye Savoy, NP

## 2019-01-04 ENCOUNTER — Ambulatory Visit (HOSPITAL_COMMUNITY): Payer: Medicare Other | Attending: Cardiovascular Disease

## 2019-01-04 ENCOUNTER — Encounter: Payer: Self-pay | Admitting: Nurse Practitioner

## 2019-01-04 LAB — MYOCARDIAL PERFUSION IMAGING
LV dias vol: 112 mL (ref 62–150)
LV sys vol: 37 mL
Peak HR: 87 {beats}/min
Rest HR: 76 {beats}/min
SDS: 0
SRS: 0
SSS: 0
TID: 1.08

## 2019-01-04 MED ORDER — TECHNETIUM TC 99M TETROFOSMIN IV KIT
31.9000 | PACK | Freq: Once | INTRAVENOUS | Status: AC | PRN
  Administered 2019-01-04: 31.9 via INTRAVENOUS
  Filled 2019-01-04: qty 32

## 2019-01-04 NOTE — Progress Notes (Signed)
ASSESSMENT / PLAN:   32.  68 year old male with history of adenomatous colon polyps ( 2 small tubular adenomas at time of last colonoscopy April 2018).   Based on current guidelines he would not be due for colonoscopy until 2023.  However, we have been asked to perform colonoscopy as part of patient's evaluation for renal transplant.  -The risks and benefits of colonoscopy with possible polypectomy / biopsies were discussed and the patient agrees to proceed.   2. GERD, asymptomatic on daily Omeprazole.    HPI:    Referring Provider:    Dr.Joseph Coladonato - Nephrology Reason for referral:    History of colon polyps.  Need colonoscopy for renal transplant evaluation  Chief Complaint:   None  Patient is a 68 year old male with ESRD on HD for 2.5 years now.  PMH is also significant for GERD, hypertension, gout.  He has no overt GI bleeding.  No bowel changes, abdominal pain or other alarm features.  He has mild normocytic anemia but also has CKD.  No family history of colorectal cancer.  Patient had a colonoscopy around age 82, says no polyps were found at that time.  He had another colonoscopy April 2018.  This colonoscopy was done in [place].  The exam was complete (two cecum).  Findings included internal hemorrhoids, multiple 2 to 3 mm polyps from the rectum, 2 to 3 mm polyp at the hepatic flexure.  Hepatic flexure polyp was a tubular adenoma.  One of the rectal polyps was also a tubular adenoma.  Data Reviewed:  12/07/2018 Ferritin 933, TIBC 280, transferrin 208   Past Medical History:  Diagnosis Date   Anemia    Chronic right hip pain    ESRD (end stage renal disease) on dialysis [facility])    dialysis M/W/F- [facility] (01/06/2017)   GERD (gastroesophageal reflux disease)    Gout    High cholesterol    Hypertension    Neuropathy    Osteoarthritis    "all over" (01/06/2017)   Pleurisy    Pneumonia 1980s X 1     Past Surgical  History:  Procedure Laterality Date   A/V FISTULAGRAM Left 12/24/2016   Procedure: A/V FISTULAGRAM;  Surgeon: Waynetta Sandy, MD;  Location: [place] [facility];  Service: Cardiovascular;  Laterality: Left;   APPENDECTOMY  1963   AV FISTULA PLACEMENT Left 08/04/2016   Procedure: ARTERIOVENOUS (AV) FISTULA CREATION/LEFT ARM;  Surgeon: Serafina Mitchell, MD;  Location: [place];  Service: Vascular;  Laterality: Left;   AV FISTULA REPAIR Left 01/06/2017   arm   BILATERAL CARPAL TUNNEL RELEASE Bilateral 2006-2007   right-left   CHOLECYSTECTOMY OPEN  1979   COLONOSCOPY     FINGER SURGERY Right 2006   "thumb; took out bone that conects to wrist and a piece of tendon; weaved tendon back in for mobility"   FISTULA SUPERFICIALIZATION Left 09/24/2016   Procedure: FISTULA SUPERFICIALIZATION- LEFT ARM;  Surgeon: Serafina Mitchell, MD;  Location: [place];  Service: Vascular;  Laterality: Left;   HAMMER TOE SURGERY Left [place] Left 08/04/2016   Procedure: INSERTION OF DIALYSIS CATHETER;  Surgeon: Serafina Mitchell, MD;  Location: [place];  Service: Vascular;  Laterality: Left;   JOINT REPLACEMENT     LIGATION OF ARTERIOVENOUS  FISTULA Left 01/21/2017   Procedure: REPAIR OF ARTERIOVENOUS  FISTULA;  Surgeon: Deitra Mayo  S, MD;  Location: [place];  Service: Vascular;  Laterality: Left;   LUMBAR LAMINECTOMY/DECOMPRESSION MICRODISCECTOMY N/A 08/18/2012   Procedure: LUMBAR LAMINECTOMY/DECOMPRESSION MICRODISCECTOMY 3 LEVELS;  Surgeon: Hosie Spangle, MD;  Location: [place] [facility];  Service: Neurosurgery;  Laterality: N/A;  Lumbar Two through Five Laminectomy   REVISON OF ARTERIOVENOUS FISTULA Left 01/06/2017   Procedure: REVISON OF ARTERIOVENOUS FISTULA LEFT ARM;  Surgeon: Serafina Mitchell, MD;  Location: [place];  Service: Vascular;  Laterality: Left;   SHOULDER OPEN ROTATOR CUFF REPAIR Right 2001   THROMBECTOMY AND REVISION OF ARTERIOVENTOUS (AV) GORETEX  GRAFT  Left 01/17/2017   Procedure: REVISION OF ARTERIOVENTOUS (AV) FISTULA LEFT ARM;  Surgeon: Waynetta Sandy, MD;  Location: [place];  Service: Vascular;  Laterality: Left;   TOTAL HIP ARTHROPLASTY Right 07/26/2013   Procedure: TOTAL HIP ARTHROPLASTY;  Surgeon: Kerin Salen, MD;  Location: [place];  Service: Orthopedics;  Laterality: Right;   TOTAL KNEE ARTHROPLASTY Bilateral 2008-2010   right-left   WRIST TENDON TRANSFER Bilateral 1972-1973   left-right   Family History  Problem Relation Age of Onset   Heart disease Mother    Cancer Father    Social History   Tobacco Use   Smoking status: Former Smoker    Packs/day: 1.00    Years: 10.00    Pack years: 10.00    Types: Cigarettes    Quit date: 08/13/1982    Years since quitting: 36.4   Smokeless tobacco: Former Systems developer    Types: Chew  Substance Use Topics   Alcohol use: Not Currently    Comment: 01/06/2017 "used to be a social drinker; don't drink at all since ~ 2000"   Drug use: No   Current Outpatient Medications  Medication Sig Dispense Refill   acetaminophen (TYLENOL) 650 MG CR tablet Take 650 mg by mouth every 8 (eight) hours as needed for pain.     calcium acetate (PHOSLO) 667 MG capsule Take 1 capsule (667 mg total) by mouth 3 (three) times daily with meals. (Patient taking differently: Take 667 mg by mouth daily. ) 90 capsule 0   cetirizine (ZYRTEC) 10 MG tablet Take 10 mg by mouth at bedtime.     colchicine 0.6 MG tablet TAKE 1 TABLET BY MOUTH ONCE DAILY AS NEEDED FOR GOUT FLARE UPS 90 tablet 0   DULoxetine (CYMBALTA) 30 MG capsule TAKE 1 CAPSULE BY MOUTH ONCE DAILY DO NOT CRUSH OR CHEW     febuxostat (ULORIC) 40 MG tablet Take 1/2 (one-half) tablet by mouth once daily 90 tablet 0   folic acid-vitamin b complex-vitamin c-selenium-zinc (DIALYVITE) 3 MG TABS tablet Take 1 tablet by mouth every evening.      gabapentin (NEURONTIN) 300 MG capsule Take 300 mg by mouth at bedtime.      ibuprofen  (ADVIL,MOTRIN) 200 MG tablet Take 400 mg by mouth every 6 (six) hours as needed for mild pain.      midodrine (PROAMATINE) 10 MG tablet Take 10 mg by mouth every Monday, Wednesday, and Friday. 30 minutes before dialysis     sevelamer carbonate (RENVELA) 800 MG tablet TAKE 3 TABLETS BY MOUTH THREE TIMES DAILY WITH MEALS     VITAMIN E PO Take by mouth daily.     No current facility-administered medications for this visit.    Allergies  Allergen Reactions   Cozaar [Losartan Potassium] Other (See Comments)    Stopped due to kidney decline 4/18   Kayexalate [Polystyrene] Other (See Comments)  Abnormal ekg   Altace [Ramipril] Cough    Chronic cough   Other Swelling    "Symvisc"    Cholestyramine Light Other (See Comments)    UNSPECIFIED REACTION d   Sulfa Antibiotics Rash     Review of Systems: Positive for arthritis.  All other systems reviewed and negative except where noted in HPI.    Physical Exam:    Wt Readings from Last 3 Encounters:  01/03/19 (!) 316 lb (143.3 kg)  01/03/19 (!) 317 lb (143.8 kg)  07/21/18 (!) 316 lb 3.2 oz (143.4 kg)    BP 110/60    Pulse 80    Temp 98.3 F (36.8 C)    Ht 5' 10.5" (1.791 m)    Wt (!) 317 lb (143.8 kg)    BMI 44.84 kg/m  Constitutional:  Pleasant male in no acute distress. Psychiatric: Normal mood and affect. Behavior is normal. EENT: Pupils normal.  Conjunctivae are normal. No scleral icterus. Neck supple.  Cardiovascular: Normal rate, regular rhythm. No edema Pulmonary/chest: Effort normal and breath sounds normal. No wheezing, rales or rhonchi. Abdominal: Soft, nondistended, nontender. Bowel sounds active throughout. There are no masses palpable. No hepatomegaly. Neurological: Alert and oriented to person place and time. Skin: Skin is warm and dry. No rashes noted.  Tye Savoy, NP  01/04/2019, 4:07 PM  Cc: Donato Heinz, MD

## 2019-01-05 ENCOUNTER — Ambulatory Visit (HOSPITAL_COMMUNITY): Payer: Medicare Other

## 2019-01-05 ENCOUNTER — Telehealth: Payer: Self-pay | Admitting: Rheumatology

## 2019-01-05 DIAGNOSIS — Z79899 Other long term (current) drug therapy: Secondary | ICD-10-CM

## 2019-01-05 DIAGNOSIS — M1A39X0 Chronic gout due to renal impairment, multiple sites, without tophus (tophi): Secondary | ICD-10-CM

## 2019-01-05 NOTE — Telephone Encounter (Signed)
Patient's wife [name] called requesting labwork orders be sent to [place] in [place].  Phone [phone]  Fax [phone]  Patient will be going on Monday, 01/09/19

## 2019-01-05 NOTE — Progress Notes (Signed)
Virtual Visit via Telephone Note  I connected with Richard Bush on 01/19/19 at  9:30 AM EST by telephone and verified that I am speaking with the correct person using two identifiers.  Location: Patient: Home  Provider: Clinic  This service was conducted via virtual visit.  The patient was located at home. I was located in my office.  Consent was obtained prior to the virtual visit and is aware of possible charges through their insurance for this visit.  The patient is an established patient.  [name], MD conducted the virtual visit and Hazel Sams, PA-C acted as scribe during the service.  Office staff helped with scheduling follow up visits after the service was conducted.   I discussed the limitations, risks, security and privacy concerns of performing an evaluation and management service by telephone and the availability of in person appointments. I also discussed with the patient that there may be a patient responsible charge related to this service. The patient expressed understanding and agreed to proceed.  CC: medication monitoring  History of Present Illness: Patient is a 68 year old male with past medical history of gout and osteoarthritis. He is taking uloric 20 mg po daily and colchicine 0.6 mg 1/2 tablet daily as needed. He has not had any recent gout flares.  He continues to have chronic pain in both shoulder joints.  He was evaluated by Dr. Mayer Camel about 2 months ago and had cortisone injections at that time, which were ineffective.  He has been taking ibuprofen for pain relief which was discouraged due to being on dialysis.   Review of Systems  Constitutional: Negative for fever and malaise/fatigue.  Eyes: Negative for photophobia, pain, discharge and redness.  Respiratory: Negative for cough, shortness of breath and wheezing.   Cardiovascular: Negative for chest pain and palpitations.  Gastrointestinal: Negative for blood in stool, constipation and diarrhea.  Genitourinary:  Negative for dysuria.  Musculoskeletal: Positive for joint pain and neck pain. Negative for back pain and myalgias.       +Morning stiffness   Skin: Negative for rash.  Neurological: Negative for dizziness and headaches.  Psychiatric/Behavioral: Negative for depression. The patient is not nervous/anxious and does not have insomnia.       Observations/Objective: Physical Exam  Constitutional: He is oriented to person, place, and time.  Neurological: He is alert and oriented to person, place, and time.  Psychiatric: Mood, memory, affect and judgment normal.   Patient reports morning stiffness for 5 minutes.   Patient denies nocturnal pain.  Difficulty dressing/grooming: Denies Difficulty climbing stairs: Denies Difficulty getting out of chair: Denies Difficulty using hands for taps, buttons, cutlery, and/or writing: Denies   Assessment and Plan: Visit Diagnoses: Chronic gout due to renal impairment of multiple sites without tophus -He has not had any recent gout flares.  He is clinically doing well on Uloric 20 mg po daily and colchicine 0.3 mg po dailly prn.  Uric was 2.9 mg on 01/09/19. He is going to try to start taking Uloric 20 mg every other day.  He was advised to have his uric acid rechecked in 1 month. He was advised to notify us if he develops any signs or symptoms of a gout flare.  He will follow up in 6 months.    Chronic left shoulder pain:. He had a left shoulder x-ray on 11/18/2017 that revealed type II acromion with calcification around the [facility] joint. He was evaluated by Dr. Mayer Camel 2 months ago and had a cortisone  injection, which did not help. He plans on continuing to follow up with Dr. Mayer Camel on a regular basis. He has been taking ibuprofen for pain relief.  He was strongly advised to avoid taking NSAIDs since he is on dialysis.  He was advised to discuss other analgesics like tylenol with his nephrologist.  Primary osteoarthritis of both hands: He has no hand pain or joint  swelling at this time.  Joint protection and muscle strengthening were discussed.   Medication monitoring encounter -He is on dialysis 3 days weekly. Creatinine was 7.70 and GFR was 6 on 01/09/19.  His labs have been stable.   Primary osteoarthritis of both feet: He has no feet pain or joint swelling.   History of total hip replacement, right: Doing well.    Hx of total knee replacement, bilateral: Doing well.    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   I discussed the assessment and treatment plan with the patient. The patient was provided an opportunity to ask questions and all were answered. The patient agreed with the plan and demonstrated an understanding of the instructions.   The patient was advised to call back or seek an in-person evaluation if the symptoms worsen or if the condition fails to improve as anticipated.  I provided 15 minutes of non-face-to-face time during this encounter.   Bo Merino, MD   Scribed by-  Hazel Sams, PA-C

## 2019-01-05 NOTE — Telephone Encounter (Signed)
Lab Orders released.  

## 2019-01-09 ENCOUNTER — Ambulatory Visit (HOSPITAL_COMMUNITY): Payer: Medicare Other

## 2019-01-10 LAB — CBC WITH DIFFERENTIAL/PLATELET
Basophils Absolute: 0 10*3/uL (ref 0.0–0.2)
Basos: 1 %
EOS (ABSOLUTE): 0.2 10*3/uL (ref 0.0–0.4)
Eos: 4 %
Hematocrit: 34.9 % — ABNORMAL LOW (ref 37.5–51.0)
Hemoglobin: 12 g/dL — ABNORMAL LOW (ref 13.0–17.7)
Immature Grans (Abs): 0 10*3/uL (ref 0.0–0.1)
Immature Granulocytes: 0 %
Lymphocytes Absolute: 1.3 10*3/uL (ref 0.7–3.1)
Lymphs: 24 %
MCH: 33.1 pg — ABNORMAL HIGH (ref 26.6–33.0)
MCHC: 34.4 g/dL (ref 31.5–35.7)
MCV: 96 fL (ref 79–97)
Monocytes Absolute: 0.5 10*3/uL (ref 0.1–0.9)
Monocytes: 9 %
Neutrophils Absolute: 3.4 10*3/uL (ref 1.4–7.0)
Neutrophils: 62 %
Platelets: 223 10*3/uL (ref 150–450)
RBC: 3.62 x10E6/uL — ABNORMAL LOW (ref 4.14–5.80)
RDW: 13.1 % (ref 11.6–15.4)
WBC: 5.5 10*3/uL (ref 3.4–10.8)

## 2019-01-10 LAB — CMP14+EGFR
ALT: 27 IU/L (ref 0–44)
AST: 25 IU/L (ref 0–40)
Albumin/Globulin Ratio: 1.5 (ref 1.2–2.2)
Albumin: 4.1 g/dL (ref 3.8–4.8)
Alkaline Phosphatase: 93 IU/L (ref 39–117)
BUN/Creatinine Ratio: 5 — ABNORMAL LOW (ref 10–24)
BUN: 41 mg/dL — ABNORMAL HIGH (ref 8–27)
Bilirubin Total: 0.3 mg/dL (ref 0.0–1.2)
CO2: 23 mmol/L (ref 20–29)
Calcium: 10 mg/dL (ref 8.6–10.2)
Chloride: 95 mmol/L — ABNORMAL LOW (ref 96–106)
Creatinine, Ser: 7.7 mg/dL — ABNORMAL HIGH (ref 0.76–1.27)
GFR calc Af Amer: 7 mL/min/{1.73_m2} — ABNORMAL LOW (ref >59)
GFR calc non Af Amer: 6 mL/min/{1.73_m2} — ABNORMAL LOW (ref >59)
Globulin, Total: 2.8 g/dL (ref 1.5–4.5)
Glucose: 92 mg/dL (ref 65–99)
Potassium: 4.8 mmol/L (ref 3.5–5.2)
Sodium: 140 mmol/L (ref 134–144)
Total Protein: 6.9 g/dL (ref 6.0–8.5)

## 2019-01-10 LAB — URIC ACID: Uric Acid: 2.9 mg/dL — ABNORMAL LOW (ref 3.7–8.6)

## 2019-01-10 NOTE — Telephone Encounter (Signed)
Labs are stable.  Uric acid is in desirable range.  Please forward labs to his nephrologist.

## 2019-01-19 ENCOUNTER — Other Ambulatory Visit: Payer: Self-pay

## 2019-01-19 ENCOUNTER — Telehealth (INDEPENDENT_AMBULATORY_CARE_PROVIDER_SITE_OTHER): Payer: Medicare Other | Admitting: Rheumatology

## 2019-01-19 ENCOUNTER — Encounter: Payer: Self-pay | Admitting: Rheumatology

## 2019-01-19 DIAGNOSIS — Z96641 Presence of right artificial hip joint: Secondary | ICD-10-CM

## 2019-01-19 DIAGNOSIS — M19072 Primary osteoarthritis, left ankle and foot: Secondary | ICD-10-CM

## 2019-01-19 DIAGNOSIS — M19071 Primary osteoarthritis, right ankle and foot: Secondary | ICD-10-CM | POA: Diagnosis not present

## 2019-01-19 DIAGNOSIS — N2581 Secondary hyperparathyroidism of renal origin: Secondary | ICD-10-CM

## 2019-01-19 DIAGNOSIS — Z87448 Personal history of other diseases of urinary system: Secondary | ICD-10-CM

## 2019-01-19 DIAGNOSIS — M1A39X0 Chronic gout due to renal impairment, multiple sites, without tophus (tophi): Secondary | ICD-10-CM | POA: Diagnosis not present

## 2019-01-19 DIAGNOSIS — M19042 Primary osteoarthritis, left hand: Secondary | ICD-10-CM

## 2019-01-19 DIAGNOSIS — Z96653 Presence of artificial knee joint, bilateral: Secondary | ICD-10-CM

## 2019-01-19 DIAGNOSIS — M19041 Primary osteoarthritis, right hand: Secondary | ICD-10-CM

## 2019-01-19 DIAGNOSIS — Z8719 Personal history of other diseases of the digestive system: Secondary | ICD-10-CM

## 2019-01-31 ENCOUNTER — Telehealth: Payer: Self-pay | Admitting: Rheumatology

## 2019-01-31 NOTE — Telephone Encounter (Signed)
I LMOM for patient to call, and schedule a follow up appt for around 07/19/2019.

## 2019-02-19 ENCOUNTER — Inpatient Hospital Stay (HOSPITAL_COMMUNITY)
Admission: EM | Admit: 2019-02-19 | Discharge: 2019-02-24 | DRG: 177 | Disposition: A | Discharge status: Home / Self Care | Payer: Medicare Other | Source: Ambulatory Visit | Attending: Internal Medicine | Admitting: Internal Medicine

## 2019-02-19 ENCOUNTER — Emergency Department (HOSPITAL_COMMUNITY): Payer: Medicare Other

## 2019-02-19 ENCOUNTER — Observation Stay (HOSPITAL_COMMUNITY): Payer: Medicare Other

## 2019-02-19 ENCOUNTER — Other Ambulatory Visit: Payer: Self-pay

## 2019-02-19 ENCOUNTER — Encounter (HOSPITAL_COMMUNITY): Payer: Self-pay | Admitting: Emergency Medicine

## 2019-02-19 DIAGNOSIS — G92 Toxic encephalopathy: Secondary | ICD-10-CM | POA: Diagnosis not present

## 2019-02-19 DIAGNOSIS — R5381 Other malaise: Secondary | ICD-10-CM | POA: Diagnosis present

## 2019-02-19 DIAGNOSIS — R410 Disorientation, unspecified: Secondary | ICD-10-CM

## 2019-02-19 DIAGNOSIS — R7989 Other specified abnormal findings of blood chemistry: Secondary | ICD-10-CM | POA: Diagnosis present

## 2019-02-19 DIAGNOSIS — R2990 Unspecified symptoms and signs involving the nervous system: Secondary | ICD-10-CM

## 2019-02-19 DIAGNOSIS — Z992 Dependence on renal dialysis: Secondary | ICD-10-CM

## 2019-02-19 DIAGNOSIS — Z9981 Dependence on supplemental oxygen: Secondary | ICD-10-CM

## 2019-02-19 DIAGNOSIS — Z96653 Presence of artificial knee joint, bilateral: Secondary | ICD-10-CM | POA: Diagnosis present

## 2019-02-19 DIAGNOSIS — E878 Other disorders of electrolyte and fluid balance, not elsewhere classified: Secondary | ICD-10-CM | POA: Diagnosis not present

## 2019-02-19 DIAGNOSIS — D631 Anemia in chronic kidney disease: Secondary | ICD-10-CM | POA: Diagnosis present

## 2019-02-19 DIAGNOSIS — F40240 Claustrophobia: Secondary | ICD-10-CM | POA: Diagnosis present

## 2019-02-19 DIAGNOSIS — G9340 Encephalopathy, unspecified: Secondary | ICD-10-CM | POA: Diagnosis not present

## 2019-02-19 DIAGNOSIS — I9589 Other hypotension: Secondary | ICD-10-CM | POA: Diagnosis present

## 2019-02-19 DIAGNOSIS — J9601 Acute respiratory failure with hypoxia: Secondary | ICD-10-CM | POA: Diagnosis not present

## 2019-02-19 DIAGNOSIS — N186 End stage renal disease: Secondary | ICD-10-CM | POA: Diagnosis not present

## 2019-02-19 DIAGNOSIS — E7800 Pure hypercholesterolemia, unspecified: Secondary | ICD-10-CM | POA: Diagnosis present

## 2019-02-19 DIAGNOSIS — R0902 Hypoxemia: Secondary | ICD-10-CM

## 2019-02-19 DIAGNOSIS — N2581 Secondary hyperparathyroidism of renal origin: Secondary | ICD-10-CM | POA: Diagnosis present

## 2019-02-19 DIAGNOSIS — Z87891 Personal history of nicotine dependence: Secondary | ICD-10-CM

## 2019-02-19 DIAGNOSIS — G629 Polyneuropathy, unspecified: Secondary | ICD-10-CM | POA: Diagnosis present

## 2019-02-19 DIAGNOSIS — Y9289 Other specified places as the place of occurrence of the external cause: Secondary | ICD-10-CM

## 2019-02-19 DIAGNOSIS — R471 Dysarthria and anarthria: Secondary | ICD-10-CM | POA: Diagnosis present

## 2019-02-19 DIAGNOSIS — T503X5A Adverse effect of electrolytic, caloric and water-balance agents, initial encounter: Secondary | ICD-10-CM | POA: Diagnosis present

## 2019-02-19 DIAGNOSIS — E875 Hyperkalemia: Secondary | ICD-10-CM | POA: Diagnosis not present

## 2019-02-19 DIAGNOSIS — Z882 Allergy status to sulfonamides status: Secondary | ICD-10-CM

## 2019-02-19 DIAGNOSIS — K219 Gastro-esophageal reflux disease without esophagitis: Secondary | ICD-10-CM | POA: Diagnosis present

## 2019-02-19 DIAGNOSIS — Z79899 Other long term (current) drug therapy: Secondary | ICD-10-CM

## 2019-02-19 DIAGNOSIS — Z6841 Body Mass Index (BMI) 40.0 and over, adult: Secondary | ICD-10-CM

## 2019-02-19 DIAGNOSIS — M1A00X0 Idiopathic chronic gout, unspecified site, without tophus (tophi): Secondary | ICD-10-CM | POA: Diagnosis present

## 2019-02-19 DIAGNOSIS — Z888 Allergy status to other drugs, medicaments and biological substances status: Secondary | ICD-10-CM

## 2019-02-19 DIAGNOSIS — E871 Hypo-osmolality and hyponatremia: Secondary | ICD-10-CM | POA: Diagnosis present

## 2019-02-19 DIAGNOSIS — R4701 Aphasia: Secondary | ICD-10-CM | POA: Diagnosis present

## 2019-02-19 DIAGNOSIS — E785 Hyperlipidemia, unspecified: Secondary | ICD-10-CM | POA: Diagnosis present

## 2019-02-19 DIAGNOSIS — R7401 Elevation of levels of liver transaminase levels: Secondary | ICD-10-CM | POA: Diagnosis present

## 2019-02-19 DIAGNOSIS — R911 Solitary pulmonary nodule: Secondary | ICD-10-CM | POA: Diagnosis present

## 2019-02-19 DIAGNOSIS — R000 Tachycardia, unspecified: Secondary | ICD-10-CM | POA: Diagnosis present

## 2019-02-19 DIAGNOSIS — U071 COVID-19: Principal | ICD-10-CM

## 2019-02-19 DIAGNOSIS — I1311 Hypertensive heart and chronic kidney disease without heart failure, with stage 5 chronic kidney disease, or end stage renal disease: Secondary | ICD-10-CM | POA: Diagnosis present

## 2019-02-19 DIAGNOSIS — I452 Bifascicular block: Secondary | ICD-10-CM | POA: Diagnosis present

## 2019-02-19 DIAGNOSIS — M1990 Unspecified osteoarthritis, unspecified site: Secondary | ICD-10-CM | POA: Diagnosis present

## 2019-02-19 DIAGNOSIS — J1282 Pneumonia due to coronavirus disease 2019: Secondary | ICD-10-CM

## 2019-02-19 DIAGNOSIS — Z8249 Family history of ischemic heart disease and other diseases of the circulatory system: Secondary | ICD-10-CM

## 2019-02-19 HISTORY — DX: Acute respiratory failure with hypoxia: J96.01

## 2019-02-19 LAB — PROCALCITONIN: Procalcitonin: 0.65 ng/mL

## 2019-02-19 LAB — I-STAT CHEM 8, ED
BUN: 33 mg/dL — ABNORMAL HIGH (ref 8–23)
Calcium, Ion: 0.93 mmol/L — ABNORMAL LOW (ref 1.15–1.40)
Chloride: 92 mmol/L — ABNORMAL LOW (ref 98–111)
Creatinine, Ser: 9.7 mg/dL — ABNORMAL HIGH (ref 0.61–1.24)
Glucose, Bld: 98 mg/dL (ref 70–99)
HCT: 35 % — ABNORMAL LOW (ref 39.0–52.0)
Hemoglobin: 11.9 g/dL — ABNORMAL LOW (ref 13.0–17.0)
Potassium: 4.7 mmol/L (ref 3.5–5.1)
Sodium: 129 mmol/L — ABNORMAL LOW (ref 135–145)
TCO2: 28 mmol/L (ref 22–32)

## 2019-02-19 LAB — DIFFERENTIAL
Abs Immature Granulocytes: 0.02 10*3/uL (ref 0.00–0.07)
Basophils Absolute: 0 10*3/uL (ref 0.0–0.1)
Basophils Relative: 0 %
Eosinophils Absolute: 0 10*3/uL (ref 0.0–0.5)
Eosinophils Relative: 0 %
Immature Granulocytes: 0 %
Lymphocytes Relative: 17 %
Lymphs Abs: 0.8 10*3/uL (ref 0.7–4.0)
Monocytes Absolute: 0.3 10*3/uL (ref 0.1–1.0)
Monocytes Relative: 7 %
Neutro Abs: 3.7 10*3/uL (ref 1.7–7.7)
Neutrophils Relative %: 76 %

## 2019-02-19 LAB — COMPREHENSIVE METABOLIC PANEL
ALT: 47 U/L — ABNORMAL HIGH (ref 0–44)
AST: 44 U/L — ABNORMAL HIGH (ref 15–41)
Albumin: 3.3 g/dL — ABNORMAL LOW (ref 3.5–5.0)
Alkaline Phosphatase: 64 U/L (ref 38–126)
Anion gap: 18 — ABNORMAL HIGH (ref 5–15)
BUN: 30 mg/dL — ABNORMAL HIGH (ref 8–23)
CO2: 24 mmol/L (ref 22–32)
Calcium: 9.1 mg/dL (ref 8.9–10.3)
Chloride: 91 mmol/L — ABNORMAL LOW (ref 98–111)
Creatinine, Ser: 8.7 mg/dL — ABNORMAL HIGH (ref 0.61–1.24)
GFR calc Af Amer: 6 mL/min — ABNORMAL LOW (ref >60)
GFR calc non Af Amer: 6 mL/min — ABNORMAL LOW (ref >60)
Glucose, Bld: 129 mg/dL — ABNORMAL HIGH (ref 70–99)
Potassium: 4.9 mmol/L (ref 3.5–5.1)
Sodium: 133 mmol/L — ABNORMAL LOW (ref 135–145)
Total Bilirubin: 0.8 mg/dL (ref 0.3–1.2)
Total Protein: 8.2 g/dL — ABNORMAL HIGH (ref 6.5–8.1)

## 2019-02-19 LAB — FERRITIN: Ferritin: 3199 ng/mL — ABNORMAL HIGH (ref 24–336)

## 2019-02-19 LAB — CBC
HCT: 35.5 % — ABNORMAL LOW (ref 39.0–52.0)
Hemoglobin: 11.5 g/dL — ABNORMAL LOW (ref 13.0–17.0)
MCH: 32.1 pg (ref 26.0–34.0)
MCHC: 32.4 g/dL (ref 30.0–36.0)
MCV: 99.2 fL (ref 80.0–100.0)
Platelets: 170 10*3/uL (ref 150–400)
RBC: 3.58 MIL/uL — ABNORMAL LOW (ref 4.22–5.81)
RDW: 13.1 % (ref 11.5–15.5)
WBC: 4.8 10*3/uL (ref 4.0–10.5)
nRBC: 0 % (ref 0.0–0.2)

## 2019-02-19 LAB — LACTATE DEHYDROGENASE: LDH: 219 U/L — ABNORMAL HIGH (ref 98–192)

## 2019-02-19 LAB — LACTIC ACID, PLASMA
Lactic Acid, Venous: 2.2 mmol/L (ref 0.5–1.9)
Lactic Acid, Venous: 1.2 mmol/L (ref 0.5–1.9)

## 2019-02-19 LAB — POC SARS CORONAVIRUS 2 AG -  ED: SARS Coronavirus 2 Ag: POSITIVE — AB

## 2019-02-19 LAB — ETHANOL: Alcohol, Ethyl (B): <10 mg/dL (ref <10)

## 2019-02-19 LAB — D-DIMER, QUANTITATIVE: D-Dimer, Quant: 1.79 ug/mL-FEU — ABNORMAL HIGH (ref 0.00–0.50)

## 2019-02-19 LAB — PROTIME-INR
INR: 1 (ref 0.8–1.2)
Prothrombin Time: 13.5 seconds (ref 11.4–15.2)

## 2019-02-19 LAB — CBG MONITORING, ED: Glucose-Capillary: 106 mg/dL — ABNORMAL HIGH (ref 70–99)

## 2019-02-19 LAB — FIBRINOGEN: Fibrinogen: 672 mg/dL — ABNORMAL HIGH (ref 210–475)

## 2019-02-19 LAB — TRIGLYCERIDES: Triglycerides: 135 mg/dL (ref <150)

## 2019-02-19 LAB — C-REACTIVE PROTEIN: CRP: 7.5 mg/dL — ABNORMAL HIGH (ref <1.0)

## 2019-02-19 LAB — APTT: aPTT: 42 seconds — ABNORMAL HIGH (ref 24–36)

## 2019-02-19 MED ORDER — ONDANSETRON HCL 4 MG/2ML IJ SOLN
4.0000 mg | Freq: Four times a day (QID) | INTRAMUSCULAR | Status: DC | PRN
  Administered 2019-02-19: 23:00:00 4 mg via INTRAVENOUS
  Filled 2019-02-19: qty 2

## 2019-02-19 MED ORDER — SODIUM CHLORIDE 0.9% FLUSH
3.0000 mL | Freq: Two times a day (BID) | INTRAVENOUS | Status: DC
  Administered 2019-02-19 – 2019-02-24 (×10): 3 mL via INTRAVENOUS

## 2019-02-19 MED ORDER — HYDROCODONE-ACETAMINOPHEN 5-325 MG PO TABS
1.0000 | ORAL_TABLET | Freq: Four times a day (QID) | ORAL | Status: DC | PRN
  Administered 2019-02-20: 1 via ORAL
  Filled 2019-02-19: qty 1

## 2019-02-19 MED ORDER — SODIUM CHLORIDE 0.9 % IV SOLN
200.0000 mg | Freq: Once | INTRAVENOUS | Status: AC
  Administered 2019-02-19: 200 mg via INTRAVENOUS
  Filled 2019-02-19: qty 200

## 2019-02-19 MED ORDER — SODIUM CHLORIDE 0.9 % IV SOLN
100.0000 mg | Freq: Every day | INTRAVENOUS | Status: AC
  Administered 2019-02-20 – 2019-02-23 (×4): 100 mg via INTRAVENOUS
  Filled 2019-02-19 (×6): qty 20

## 2019-02-19 MED ORDER — ONDANSETRON HCL 4 MG PO TABS
4.0000 mg | ORAL_TABLET | Freq: Four times a day (QID) | ORAL | Status: DC | PRN
  Administered 2019-02-22 – 2019-02-24 (×2): 4 mg via ORAL
  Filled 2019-02-19 (×2): qty 1

## 2019-02-19 MED ORDER — ACETAMINOPHEN 325 MG PO TABS: 650.0000 mg | ORAL_TABLET | Freq: Four times a day (QID) | ORAL | Status: DC | PRN

## 2019-02-19 MED ORDER — IOHEXOL 350 MG/ML SOLN
125.0000 mL | Freq: Once | INTRAVENOUS | Status: AC | PRN
  Administered 2019-02-19: 19:00:00 125 mL via INTRAVENOUS

## 2019-02-19 MED ORDER — ACETAMINOPHEN 650 MG RE SUPP
650.0000 mg | Freq: Once | RECTAL | Status: AC
  Administered 2019-02-19: 650 mg via RECTAL
  Filled 2019-02-19: qty 1

## 2019-02-19 MED ORDER — HEPARIN SODIUM (PORCINE) 5000 UNIT/ML IJ SOLN
5000.0000 [IU] | Freq: Three times a day (TID) | INTRAMUSCULAR | Status: DC
  Administered 2019-02-19 – 2019-02-21 (×4): 5000 [IU] via SUBCUTANEOUS
  Filled 2019-02-19 (×7): qty 1

## 2019-02-19 MED ORDER — DEXAMETHASONE SODIUM PHOSPHATE 10 MG/ML IJ SOLN
6.0000 mg | INTRAMUSCULAR | Status: DC
  Administered 2019-02-19 – 2019-02-24 (×6): 6 mg via INTRAVENOUS
  Filled 2019-02-19 (×6): qty 1

## 2019-02-19 MED ORDER — SODIUM CHLORIDE 0.9 % IV SOLN
500.0000 mg | INTRAVENOUS | Status: AC
  Administered 2019-02-19 – 2019-02-23 (×5): 500 mg via INTRAVENOUS
  Filled 2019-02-19 (×5): qty 500

## 2019-02-19 MED ORDER — SODIUM CHLORIDE 0.9 % IV SOLN
2.0000 g | INTRAVENOUS | Status: AC
  Administered 2019-02-19 – 2019-02-23 (×5): 2 g via INTRAVENOUS
  Filled 2019-02-19 (×5): qty 20

## 2019-02-19 NOTE — ED Notes (Signed)
Per pt he does not produce urine anymore

## 2019-02-19 NOTE — ED Notes (Signed)
Pt became nauseous w/o vomiting after decadron given. PRN zofran given.

## 2019-02-19 NOTE — ED Provider Notes (Signed)
[facility] EMERGENCY DEPARTMENT Provider Note   CSN: [id number] Arrival date & time: 02/19/19  1805  An emergency department physician performed an initial assessment on this suspected stroke patient at 1830.  History Chief Complaint  Patient presents with  . Altered Mental Status    Richard Bush is a 69 y.o. male.  Patient brought in by EMS from dialysis.  Patient also with a positive Covid test on January 1.  Had symptoms a few days before that according to his wife.  His wife is also sick.  But she has not been tested.  Patient drove himself to dialysis today.  Apparently things seem to be okay normally he is dialysis Monday Wednesday and Friday.  But probably was shifted today due to the holiday circumstances.  They noted that after dialysis was complete at 5 and he was there from 1-5.  That they seem to be having speech problems and seemed to be having difficulty walking.  EMS was called.  For the altered mental status and he was brought in.  Upon arrival patient was having difficulty speaking at times.  Also could not get him due to finger-to-nose properly with his left hand.  Seem to have a little bit of weakness on his left lower extremity.  And then we had the intermittent speech problems.  Was difficult to assess whether there was any direct visual abnormalities at that time.  Patient seemed to be avoiding anything on the left side.  Code stroke was activated due to the fact that patient was theoretically in the time window.  But after we spoke to his wife it sounds like he has been doing too well for the past several days probably related to the Covid infection.  Patient was taken immediately to CT scan of the head which was negative.        Past Medical History:  Diagnosis Date  . Anemia   . Chronic right hip pain   . ESRD (end stage renal disease) on dialysis [facility])    dialysis M/W/F- [facility] (01/06/2017)  . GERD (gastroesophageal reflux disease)     . Gout   . High cholesterol   . Hypertension   . Neuropathy   . Osteoarthritis    "all over" (01/06/2017)  . Pleurisy   . Pneumonia 1980s X 1    Patient Active Problem List   Diagnosis Date Noted  . ESRD (end stage renal disease) ([place]) 01/21/2017  . Orthostatic dizziness   . 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 Pressure injury of skin 08/04/2016  . Secondary renal hyperparathyroidism ([place]) 08/04/2016  . Anemia secondary to renal failure 08/04/2016  . Pulmonary nodule 08/03/2016  . ESRD on dialysis ([place]) 08/03/2016  . Right bundle branch block 08/03/2016  . History of gastroesophageal reflux (GERD) 07/13/2016  . Idiopathic chronic gout without tophus 07/03/2016  . Primary osteoarthritis of left knee 07/03/2016  . Stage 4 chronic kidney disease ([place]) 07/03/2016  . Osteoarthritis of hand 07/03/2016  . Hypertension 07/03/2016  . Secondary hyperparathyroidism ([place]) 07/03/2016  . Primary osteoarthritis of both feet 07/03/2016  . History of total hip replacement, right 07/03/2016  . Hx of total knee replacement, bilateral 07/03/2016    Past Surgical History:  Procedure Laterality Date  . A/V FISTULAGRAM Left 12/24/2016   Procedure: A/V FISTULAGRAM;  Surgeon: Waynetta Sandy, MD;  Location: [place] CV LAB;  Service: Cardiovascular;  Laterality: Left;  . APPENDECTOMY  1963  .  AV FISTULA PLACEMENT Left 08/04/2016   Procedure: ARTERIOVENOUS (AV) FISTULA CREATION/LEFT ARM;  Surgeon: Serafina Mitchell, MD;  Location: [facility] OR;  Service: Vascular;  Laterality: Left;  . AV FISTULA REPAIR Left 01/06/2017   arm  . BILATERAL CARPAL TUNNEL RELEASE Bilateral 2006-2007   right-left  . CHOLECYSTECTOMY OPEN  1979  . COLONOSCOPY    . FINGER SURGERY Right 2006   "thumb; took out bone that conects to wrist and a piece of tendon; weaved tendon back in for mobility"  . FISTULA SUPERFICIALIZATION Left 09/24/2016   Procedure:  FISTULA SUPERFICIALIZATION- LEFT ARM;  Surgeon: Serafina Mitchell, MD;  Location: [facility];  Service: Vascular;  Laterality: Left;  . HAMMER TOE SURGERY Left 1981  . INSERTION OF DIALYSIS CATHETER Left 08/04/2016   Procedure: INSERTION OF DIALYSIS CATHETER;  Surgeon: Serafina Mitchell, MD;  Location: [facility];  Service: Vascular;  Laterality: Left;  . JOINT REPLACEMENT    . LIGATION OF ARTERIOVENOUS  FISTULA Left 01/21/2017   Procedure: REPAIR OF ARTERIOVENOUS  FISTULA;  Surgeon: Angelia Mould, MD;  Location: [facility];  Service: Vascular;  Laterality: Left;  . LUMBAR LAMINECTOMY/DECOMPRESSION MICRODISCECTOMY N/A 08/18/2012   Procedure: LUMBAR LAMINECTOMY/DECOMPRESSION MICRODISCECTOMY 3 LEVELS;  Surgeon: Hosie Spangle, MD;  Location: [place] NEURO ORS;  Service: Neurosurgery;  Laterality: N/A;  Lumbar Two through Five Laminectomy  . REVISON OF ARTERIOVENOUS FISTULA Left 01/06/2017   Procedure: REVISON OF ARTERIOVENOUS FISTULA LEFT ARM;  Surgeon: Serafina Mitchell, MD;  Location: [place];  Service: Vascular;  Laterality: Left;  . SHOULDER OPEN ROTATOR CUFF REPAIR Right 2001  . THROMBECTOMY AND REVISION OF ARTERIOVENTOUS (AV) GORETEX  GRAFT Left 01/17/2017   Procedure: REVISION OF ARTERIOVENTOUS (AV) FISTULA LEFT ARM;  Surgeon: Waynetta Sandy, MD;  Location: [place];  Service: Vascular;  Laterality: Left;  . TOTAL HIP ARTHROPLASTY Right 07/26/2013   Procedure: TOTAL HIP ARTHROPLASTY;  Surgeon: Kerin Salen, MD;  Location: [place];  Service: Orthopedics;  Laterality: Right;  . TOTAL KNEE ARTHROPLASTY Bilateral 2008-2010   right-left  . WRIST TENDON TRANSFER Bilateral 1972-1973   left-right       Family History  Problem Relation Age of Onset  . Heart disease Mother   . Cancer Father     Social History   Tobacco Use  . Smoking status: Former Smoker    Packs/day: 1.00    Years: 10.00    Pack years: 10.00    Types: Cigarettes    Quit date: 08/13/1982    Years since quitting: 36.5  .  Smokeless tobacco: Former Systems developer    Types: Chew  Substance Use Topics  . Alcohol use: Not Currently    Comment: 01/06/2017 "used to be a social drinker; don't drink at all since ~ 2000"  . Drug use: No    Home Medications Prior to Admission medications   Medication Sig Start Date End Date Taking? Authorizing Provider  acetaminophen (TYLENOL) 650 MG CR tablet Take 650 mg by mouth every 8 (eight) hours as needed for pain.    [provider]  calcium acetate (PHOSLO) 667 MG capsule Take 1 capsule (667 mg total) by mouth 3 (three) times daily with meals. Patient taking differently: Take 667 mg by mouth daily.  08/08/16   Ledell Noss, MD  cetirizine (ZYRTEC) 10 MG tablet Take 10 mg by mouth at bedtime.    [provider]  colchicine 0.6 MG tablet TAKE 1 TABLET BY MOUTH ONCE DAILY AS NEEDED FOR GOUT  FLARE UPS 11/15/18   Bo Merino, MD  DULoxetine (CYMBALTA) 30 MG capsule TAKE 1 CAPSULE BY MOUTH ONCE DAILY DO NOT CRUSH OR CHEW 05/05/18   [provider]  febuxostat (ULORIC) 40 MG tablet Take 1/2 (one-half) tablet by mouth once daily 11/03/18   Bo Merino, MD  folic acid-vitamin b complex-vitamin c-selenium-zinc (DIALYVITE) 3 MG TABS tablet Take 1 tablet by mouth every evening.     [provider]  gabapentin (NEURONTIN) 300 MG capsule Take 300 mg by mouth at bedtime.     [provider]  ibuprofen (ADVIL,MOTRIN) 200 MG tablet Take 400 mg by mouth every 6 (six) hours as needed for mild pain.     [provider]  midodrine (PROAMATINE) 10 MG tablet Take 10 mg by mouth every Monday, Wednesday, and Friday. 30 minutes before dialysis    [provider]  ranitidine (ZANTAC) 150 MG tablet Take 150 mg by mouth every morning.     [provider]  sevelamer carbonate (RENVELA) 800 MG tablet TAKE 3 TABLETS BY MOUTH THREE TIMES DAILY WITH MEALS 06/29/18   [provider]  VITAMIN E PO Take by mouth daily.    [provider]    Allergies    Cozaar [losartan potassium], Kayexalate [polystyrene], Altace [ramipril], Other, Cholestyramine light, and Sulfa antibiotics  Review of Systems   Review of Systems  Unable to perform ROS: Mental status change    Physical Exam Updated Vital Signs BP 122/63   Pulse 93   Temp (!) 102.3 F (39.1 C) (Oral)   Resp 18   SpO2 96%   Physical Exam Vitals and nursing note reviewed.  Constitutional:      General: He is in acute distress.     Appearance: Normal appearance. He is well-developed.  HENT:     Head: Normocephalic and atraumatic.  Eyes:     Extraocular Movements: Extraocular movements intact.     Conjunctiva/sclera: Conjunctivae normal.     Pupils: Pupils are equal, round, and reactive to light.  Cardiovascular:     Rate and Rhythm: Normal rate and regular rhythm.     Heart sounds: No murmur.  Pulmonary:     Effort: Respiratory distress present.     Breath sounds: Normal breath sounds.  Abdominal:     Palpations: Abdomen is soft.     Tenderness: There is no abdominal tenderness.  Musculoskeletal:        General: Normal range of motion.     Cervical back: Normal range of motion and neck supple.     Right lower leg: No edema.     Left lower leg: No edema.  Skin:    General: Skin is warm and dry.     Capillary Refill: Capillary refill takes less than 2 seconds.  Neurological:     Mental Status: He is alert.     Comments: Patient awake.  Has some coordination issues.  Some intermittent speech problems.  But will follow commands.  Able to state his name.  Is confused about when his Covid test was positive.  Seem to be heel-to-shin on both legs was off a little bit of leg weakness in the left lower extremity.  Finger-to-nose could not get him to coordinate that with his left hand but he did fine with his right hand.  No facial weakness.  Vision able to distinguish between one finger and two finger.     ED Results / Procedures / Treatments     Labs (  all labs ordered are listed, but only abnormal results are displayed) Labs Reviewed  LACTIC ACID, PLASMA - Abnormal; Notable for the following components:      Result Value   Lactic Acid, Venous 2.2 (*)    All other components within normal limits  APTT - Abnormal; Notable for the following components:   aPTT 42 (*)    All other components within normal limits  CBC - Abnormal; Notable for the following components:   RBC 3.58 (*)    Hemoglobin 11.5 (*)    HCT 35.5 (*)    All other components within normal limits  COMPREHENSIVE METABOLIC PANEL - Abnormal; Notable for the following components:   Sodium 133 (*)    Chloride 91 (*)    Glucose, Bld 129 (*)    BUN 30 (*)    Creatinine, Ser 8.70 (*)    Total Protein 8.2 (*)    Albumin 3.3 (*)    AST 44 (*)    ALT 47 (*)    GFR calc non Af Amer 6 (*)    GFR calc Af Amer 6 (*)    Anion gap 18 (*)    All other components within normal limits  C-REACTIVE PROTEIN - Abnormal; Notable for the following components:   CRP 7.5 (*)    All other components within normal limits  D-DIMER, QUANTITATIVE (NOT AT [facility]) - Abnormal; Notable for the following components:   D-Dimer, Quant 1.79 (*)    All other components within normal limits  FERRITIN - Abnormal; Notable for the following components:   Ferritin 3,199 (*)    All other components within normal limits  FIBRINOGEN - Abnormal; Notable for the following components:   Fibrinogen 672 (*)    All other components within normal limits  LACTATE DEHYDROGENASE - Abnormal; Notable for the following components:   LDH 219 (*)    All other components within normal limits  CBG MONITORING, ED - Abnormal; Notable for the following components:   Glucose-Capillary 106 (*)    All other components within normal limits  CULTURE, BLOOD (ROUTINE X 2)  CULTURE, BLOOD (ROUTINE X 2)  PROTIME-INR  DIFFERENTIAL  PROCALCITONIN  TRIGLYCERIDES  LACTIC ACID, PLASMA  ETHANOL  RAPID URINE DRUG SCREEN, HOSP  PERFORMED  URINALYSIS, ROUTINE W REFLEX MICROSCOPIC  I-STAT CHEM 8, ED  POC SARS CORONAVIRUS 2 AG -  ED    EKG EKG Interpretation  Date/Time:  Sunday February 19 2019 18:40:10 EST Ventricular Rate:  105 PR Interval:    QRS Duration: 155 QT Interval:  365 QTC Calculation: 483 R Axis:   -96 Text Interpretation: Sinus or ectopic atrial tachycardia RBBB and LAFB No significant change since last tracing Confirmed by Fredia Sorrow [phone]) on 02/19/2019 7:40:00 PM   Radiology CT Code Stroke CTA Head W/WO contrast  Result Date: 02/19/2019 CLINICAL DATA:  Dialysis patient with acute mental status changes. EXAM: CT ANGIOGRAPHY HEAD AND NECK CT PERFUSION BRAIN TECHNIQUE: Multidetector CT imaging of the head and neck was performed using the standard protocol during bolus administration of intravenous contrast. Multiplanar CT image reconstructions and MIPs were obtained to evaluate the vascular anatomy. Carotid stenosis measurements (when applicable) are obtained utilizing NASCET criteria, using the distal internal carotid diameter as the denominator. Multiphase CT imaging of the brain was performed following IV bolus contrast injection. Subsequent parametric perfusion maps were calculated using RAPID software. CONTRAST:  124mL OMNIPAQUE IOHEXOL 350 MG/ML SOLN COMPARISON:  Head CT earlier same day. FINDINGS: CTA NECK FINDINGS Aortic  arch: Aortic atherosclerosis. No aneurysm or dissection. Branching pattern is normal. Right carotid system: Common carotid artery widely patent to the bifurcation. Mild atherosclerotic plaque at the ICA bulb but no stenosis. Cervical ICA widely patent beyond that. Left carotid system: Common carotid artery widely patent to the bifurcation. Mild atherosclerotic plaque at the bifurcation but no stenosis. Cervical ICA widely patent beyond that. Vertebral arteries: Both vertebral artery origins show some adjacent plaque but no stenosis. Left vertebral artery is dominant. Both  vertebral arteries widely patent through the cervical region to the foramen magnum. Skeleton: Ordinary cervical spondylosis. Other neck: No mass or lymphadenopathy. Upper chest: Patchy pulmonary infiltrates consistent with pneumonia. Patient with recent coronavirus diagnosis. Review of the MIP images confirms the above findings CTA HEAD FINDINGS Anterior circulation: Both internal carotid arteries are patent through the skull base and siphon regions. Siphon atherosclerotic calcification but without stenosis greater than 30%. Anterior and middle cerebral vessels are patent without proximal stenosis, aneurysm or vascular malformation. No large or medium vessel occlusion. Posterior circulation: Both vertebral arteries are widely patent to the basilar. No basilar stenosis. Posterior circulation branch vessels are patent and unremarkable. Venous sinuses: Patent and normal. Anatomic variants: None significant. Review of the MIP images confirms the above findings CT Brain Perfusion Findings: ASPECTS: 10 CBF (<30%) Volume: 33mL Perfusion (Tmax>6.0s) volume: 8mL reported by the program. I think this is artifactual and related to patient motion. This is bilateral and symmetric affecting the inferior cortex. Mismatch Volume: 72mL reported, but in actuality I do not believe there is an acute insult. Infarction Location:None IMPRESSION: No large or medium vessel occlusion. Ordinary mild atherosclerosis of the carotid bifurcations and carotid siphons but without flow limiting stenosis. Perfusion imaging does not show an infarction. I do not believe there is any true at risk brain. There is a good deal of motion during this examination, explaining the T-max data, which I believe is artifactual. Case discussed by telephone with Dr. Malen Gauze at 1932 hours. Electronically Signed   By: Nelson Chimes M.D.   On: 02/19/2019 19:39   CT Code Stroke CTA Neck W/WO contrast  Result Date: 02/19/2019 CLINICAL DATA:  Dialysis patient with acute  mental status changes. EXAM: CT ANGIOGRAPHY HEAD AND NECK CT PERFUSION BRAIN TECHNIQUE: Multidetector CT imaging of the head and neck was performed using the standard protocol during bolus administration of intravenous contrast. Multiplanar CT image reconstructions and MIPs were obtained to evaluate the vascular anatomy. Carotid stenosis measurements (when applicable) are obtained utilizing NASCET criteria, using the distal internal carotid diameter as the denominator. Multiphase CT imaging of the brain was performed following IV bolus contrast injection. Subsequent parametric perfusion maps were calculated using RAPID software. CONTRAST:  146mL OMNIPAQUE IOHEXOL 350 MG/ML SOLN COMPARISON:  Head CT earlier same day. FINDINGS: CTA NECK FINDINGS Aortic arch: Aortic atherosclerosis. No aneurysm or dissection. Branching pattern is normal. Right carotid system: Common carotid artery widely patent to the bifurcation. Mild atherosclerotic plaque at the ICA bulb but no stenosis. Cervical ICA widely patent beyond that. Left carotid system: Common carotid artery widely patent to the bifurcation. Mild atherosclerotic plaque at the bifurcation but no stenosis. Cervical ICA widely patent beyond that. Vertebral arteries: Both vertebral artery origins show some adjacent plaque but no stenosis. Left vertebral artery is dominant. Both vertebral arteries widely patent through the cervical region to the foramen magnum. Skeleton: Ordinary cervical spondylosis. Other neck: No mass or lymphadenopathy. Upper chest: Patchy pulmonary infiltrates consistent with pneumonia. Patient with recent coronavirus diagnosis. Review  of the MIP images confirms the above findings CTA HEAD FINDINGS Anterior circulation: Both internal carotid arteries are patent through the skull base and siphon regions. Siphon atherosclerotic calcification but without stenosis greater than 30%. Anterior and middle cerebral vessels are patent without proximal stenosis,  aneurysm or vascular malformation. No large or medium vessel occlusion. Posterior circulation: Both vertebral arteries are widely patent to the basilar. No basilar stenosis. Posterior circulation branch vessels are patent and unremarkable. Venous sinuses: Patent and normal. Anatomic variants: None significant. Review of the MIP images confirms the above findings CT Brain Perfusion Findings: ASPECTS: 10 CBF (<30%) Volume: 56mL Perfusion (Tmax>6.0s) volume: 22mL reported by the program. I think this is artifactual and related to patient motion. This is bilateral and symmetric affecting the inferior cortex. Mismatch Volume: 25mL reported, but in actuality I do not believe there is an acute insult. Infarction Location:None IMPRESSION: No large or medium vessel occlusion. Ordinary mild atherosclerosis of the carotid bifurcations and carotid siphons but without flow limiting stenosis. Perfusion imaging does not show an infarction. I do not believe there is any true at risk brain. There is a good deal of motion during this examination, explaining the T-max data, which I believe is artifactual. Case discussed by telephone with Dr. Malen Gauze at 1932 hours. Electronically Signed   By: Nelson Chimes M.D.   On: 02/19/2019 19:39   CT Code Stroke Cerebral Perfusion with contrast  Result Date: 02/19/2019 CLINICAL DATA:  Dialysis patient with acute mental status changes. EXAM: CT ANGIOGRAPHY HEAD AND NECK CT PERFUSION BRAIN TECHNIQUE: Multidetector CT imaging of the head and neck was performed using the standard protocol during bolus administration of intravenous contrast. Multiplanar CT image reconstructions and MIPs were obtained to evaluate the vascular anatomy. Carotid stenosis measurements (when applicable) are obtained utilizing NASCET criteria, using the distal internal carotid diameter as the denominator. Multiphase CT imaging of the brain was performed following IV bolus contrast injection. Subsequent parametric perfusion  maps were calculated using RAPID software. CONTRAST:  156mL OMNIPAQUE IOHEXOL 350 MG/ML SOLN COMPARISON:  Head CT earlier same day. FINDINGS: CTA NECK FINDINGS Aortic arch: Aortic atherosclerosis. No aneurysm or dissection. Branching pattern is normal. Right carotid system: Common carotid artery widely patent to the bifurcation. Mild atherosclerotic plaque at the ICA bulb but no stenosis. Cervical ICA widely patent beyond that. Left carotid system: Common carotid artery widely patent to the bifurcation. Mild atherosclerotic plaque at the bifurcation but no stenosis. Cervical ICA widely patent beyond that. Vertebral arteries: Both vertebral artery origins show some adjacent plaque but no stenosis. Left vertebral artery is dominant. Both vertebral arteries widely patent through the cervical region to the foramen magnum. Skeleton: Ordinary cervical spondylosis. Other neck: No mass or lymphadenopathy. Upper chest: Patchy pulmonary infiltrates consistent with pneumonia. Patient with recent coronavirus diagnosis. Review of the MIP images confirms the above findings CTA HEAD FINDINGS Anterior circulation: Both internal carotid arteries are patent through the skull base and siphon regions. Siphon atherosclerotic calcification but without stenosis greater than 30%. Anterior and middle cerebral vessels are patent without proximal stenosis, aneurysm or vascular malformation. No large or medium vessel occlusion. Posterior circulation: Both vertebral arteries are widely patent to the basilar. No basilar stenosis. Posterior circulation branch vessels are patent and unremarkable. Venous sinuses: Patent and normal. Anatomic variants: None significant. Review of the MIP images confirms the above findings CT Brain Perfusion Findings: ASPECTS: 10 CBF (<30%) Volume: 29mL Perfusion (Tmax>6.0s) volume: 10mL reported by the program. I think this is artifactual and  related to patient motion. This is bilateral and symmetric affecting the  inferior cortex. Mismatch Volume: 62mL reported, but in actuality I do not believe there is an acute insult. Infarction Location:None IMPRESSION: No large or medium vessel occlusion. Ordinary mild atherosclerosis of the carotid bifurcations and carotid siphons but without flow limiting stenosis. Perfusion imaging does not show an infarction. I do not believe there is any true at risk brain. There is a good deal of motion during this examination, explaining the T-max data, which I believe is artifactual. Case discussed by telephone with Dr. Malen Gauze at 1932 hours. Electronically Signed   By: Nelson Chimes M.D.   On: 02/19/2019 19:39   DG Chest Port 1 View  Result Date: 02/19/2019 CLINICAL DATA:  Shortness of breath, dialysis, altered mental status EXAM: PORTABLE CHEST 1 VIEW COMPARISON:  08/04/2016 FINDINGS: Mild cardiomegaly. Both lungs are clear. The visualized skeletal structures are unremarkable. IMPRESSION: Mild cardiomegaly without acute abnormality of the lungs in AP portable projection. Electronically Signed   By: Yuval Candle M.D.   On: 02/19/2019 20:33   CT HEAD CODE STROKE WO CONTRAST  Result Date: 02/19/2019 CLINICAL DATA:  Code stroke. Dialysis patient with expressive aphasia. EXAM: CT HEAD WITHOUT CONTRAST TECHNIQUE: Contiguous axial images were obtained from the base of the skull through the vertex without intravenous contrast. COMPARISON:  None. FINDINGS: Brain: The study suffers from motion degradation. Allowing for that, there is no evidence of old or acute infarction, mass lesion, hemorrhage, hydrocephalus or extra-axial collection. Vascular: There is atherosclerotic calcification of the major vessels at the base of the brain. Skull: Negative Sinuses/Orbits: Clear/normal Other: None ASPECTS ([place] Stroke Program Early CT Score) - Ganglionic level infarction (caudate, lentiform nuclei, internal capsule, insula, M1-M3 cortex): 7 - Supraganglionic infarction (M4-M6 cortex): 3 Total score (0-10  with 10 being normal): 10 IMPRESSION: 1. Motion degraded but otherwise normal examination. 2. ASPECTS is 10. 3. These results were communicated to Dr. Rory Percy at [place] 1/3/2021by text page via the [facility] messaging system. Electronically Signed   By: Nelson Chimes M.D.   On: 02/19/2019 19:12    Procedures Procedures (including critical care time)  CRITICAL CARE Performed by: Fredia Sorrow Total critical care time: 30 minutes Critical care time was exclusive of separately billable procedures and treating other patients. Critical care was necessary to treat or prevent imminent or life-threatening deterioration. Critical care was time spent personally by me on the following activities: development of treatment plan with patient and/or surrogate as well as nursing, discussions with consultants, evaluation of patient's response to treatment, examination of patient, obtaining history from patient or surrogate, ordering and performing treatments and interventions, ordering and review of laboratory studies, ordering and review of radiographic studies, pulse oximetry and re-evaluation of patient's condition.  Medications Ordered in ED Medications  iohexol (OMNIPAQUE) 350 MG/ML injection 125 mL (125 mLs Intravenous Contrast Given 02/19/19 1928)  acetaminophen (TYLENOL) suppository 650 mg (650 mg Rectal Given 02/19/19 1945)    ED Course  I have reviewed the triage vital signs and the nursing notes.  Pertinent labs & imaging results that were available during my care of the patient were reviewed by me and considered in my medical decision making (see chart for details).    MDM Rules/Calculators/A&P                       Patient's head CT without any acute findings.  Neurology felt that unlikely to be stroke but not completely ruled  out.  Patient will need inpatient MRI.  But patient when he came back from CT scan we noted that he had some developing hypoxia.  Where his oxygen sats got as low as 86% on  room air.  He was then put on 4 L then he was satting down in the 80s again is now on 100% nonrebreather.  And oxygen sats are above 90.  Chest x-ray shows some signs of early infiltrate.  COVID-19 labs suggestive of COVID-19 infection.  Patient did not have a Covid test in our system so point-of-care was done and it was positive.  Official reading of the chest x-ray by radiology did not see any significant opacities.  Patient may very well been somewhat dehydrated because there was some diarrhea ongoing.  According to his wife and then he had dialysis and he got more volume depleted may have dropped his pressures and then may have developed some no neuro deficit that was transient.  Discussed with the hospitalist they will admit.   Final Clinical Impression(s) / ED Diagnoses Final diagnoses:  [id number] virus infection  Hypoxia  ESRD on hemodialysis [facility])  Confusion    Rx / DC Orders ED Discharge Orders    None       Fredia Sorrow, MD 02/19/19 2139

## 2019-02-19 NOTE — H&P (Signed)
History and Physical    Richard Bush BSW:[id number] DOB: 07-13-1950 DOA: 02/19/2019  PCP: Quentin Cornwall, MD   Patient coming from: Home   Chief Complaint: Confusion, speech and gait disturbance, left-sided weakness   HPI: Richard Bush is a 69 y.o. male with medical history significant for ESRD on dialysis, gout, hypertension, neuropathy, and recent COVID-19 diagnosis, now presenting to the emergency department from his dialysis center for evaluation of confusion, speech difficulty, gait difficulty, and possible left-sided weakness.  Patient reports recent exposure to a family member with COVID-19, was febrile at dialysis on 02/17/2019 and tested positive for COVID-19 at that time.  He has had a mild cough, denies any shortness of breath, and has had some loose stools, but none today.  Patient drove himself to dialysis today, seem to be normal on arrival at around 1:30 PM, but when preparing to leave the dialysis center, there was concern for aphasia, off-balance gait, and confusion.  Per report, the patient often has blood pressure fluctuations in dialysis and becomes lightheaded and confused at times.  Patient denies feeling lightheaded earlier, denies any headache, change in vision or hearing, or focal numbness or weakness.  He denies any chest pain, hemoptysis, or leg swelling or tenderness.  By time of admission, he is able to state which hospital he is currently in, the day, month, and year, and provides a coherent history.  ED Course: Upon arrival to the ED, patient is found to be febrile to 39.1 C, saturating 89% on room air, normal respirations, slightly tachycardic, and with blood pressure 103/45.  EKG features sinus rhythm with RBBB, LAFB, and no significant change.  Chest x-ray with mild cardiomegaly but no acute findings.  Noncontrast head CT negative for acute intracranial abnormality.  CTA head and neck negative for large or medium vessel occlusion or flow-limiting stenosis.  Chemistry panel with  slight elevation in transaminases, sodium 133, normal bicarbonate, and BUN 30.  CBC with mild anemia.  Lactic acid 2.2.  Covid antigen positive.  Procalcitonin 0.65, CRP 7.5, and D-dimer 1.79.  There was concern that the patient may have some left arm drift and code stroke had been activated in the ED.  Neurology has seen the patient and recommends MRI brain there is more suspicion for a toxic metabolic encephalopathy or dialysis dysequilibrium syndrome.  Patient was started on supplemental oxygen, treated with Tylenol, and blood cultures were collected.  Review of Systems:  All other systems reviewed and apart from HPI, are negative.  Past Medical History:  Diagnosis Date  . Anemia   . Chronic right hip pain   . ESRD (end stage renal disease) on dialysis [facility])    dialysis M/W/F- [facility] (01/06/2017)  . GERD (gastroesophageal reflux disease)   . Gout   . High cholesterol   . Hypertension   . Neuropathy   . Osteoarthritis    "all over" (01/06/2017)  . Pleurisy   . Pneumonia 1980s X 1    Past Surgical History:  Procedure Laterality Date  . A/V FISTULAGRAM Left 12/24/2016   Procedure: A/V FISTULAGRAM;  Surgeon: Waynetta Sandy, MD;  Location: [place] [facility];  Service: Cardiovascular;  Laterality: Left;  . APPENDECTOMY  1963  . AV FISTULA PLACEMENT Left 08/04/2016   Procedure: ARTERIOVENOUS (AV) FISTULA CREATION/LEFT ARM;  Surgeon: Serafina Mitchell, MD;  Location: [facility];  Service: Vascular;  Laterality: Left;  . AV FISTULA REPAIR Left 01/06/2017   arm  . BILATERAL CARPAL TUNNEL RELEASE Bilateral 2006-2007  right-left  . CHOLECYSTECTOMY OPEN  1979  . COLONOSCOPY    . FINGER SURGERY Right 2006   "thumb; took out bone that conects to wrist and a piece of tendon; weaved tendon back in for mobility"  . FISTULA SUPERFICIALIZATION Left 09/24/2016   Procedure: FISTULA SUPERFICIALIZATION- LEFT ARM;  Surgeon: Serafina Mitchell, MD;  Location: [place];  Service: Vascular;   Laterality: Left;  . HAMMER TOE SURGERY Left 1981  . INSERTION OF DIALYSIS CATHETER Left 08/04/2016   Procedure: INSERTION OF DIALYSIS CATHETER;  Surgeon: Serafina Mitchell, MD;  Location: [place];  Service: Vascular;  Laterality: Left;  . JOINT REPLACEMENT    . LIGATION OF ARTERIOVENOUS  FISTULA Left 01/21/2017   Procedure: REPAIR OF ARTERIOVENOUS  FISTULA;  Surgeon: Angelia Mould, MD;  Location: [place];  Service: Vascular;  Laterality: Left;  . LUMBAR LAMINECTOMY/DECOMPRESSION MICRODISCECTOMY N/A 08/18/2012   Procedure: LUMBAR LAMINECTOMY/DECOMPRESSION MICRODISCECTOMY 3 LEVELS;  Surgeon: Hosie Spangle, MD;  Location: [place] [facility];  Service: Neurosurgery;  Laterality: N/A;  Lumbar Two through Five Laminectomy  . REVISON OF ARTERIOVENOUS FISTULA Left 01/06/2017   Procedure: REVISON OF ARTERIOVENOUS FISTULA LEFT ARM;  Surgeon: Serafina Mitchell, MD;  Location: [place];  Service: Vascular;  Laterality: Left;  . SHOULDER OPEN ROTATOR CUFF REPAIR Right 2001  . THROMBECTOMY AND REVISION OF ARTERIOVENTOUS (AV) GORETEX  GRAFT Left 01/17/2017   Procedure: REVISION OF ARTERIOVENTOUS (AV) FISTULA LEFT ARM;  Surgeon: Waynetta Sandy, MD;  Location: [place];  Service: Vascular;  Laterality: Left;  . TOTAL HIP ARTHROPLASTY Right 07/26/2013   Procedure: TOTAL HIP ARTHROPLASTY;  Surgeon: Kerin Salen, MD;  Location: [place];  Service: Orthopedics;  Laterality: Right;  . TOTAL KNEE ARTHROPLASTY Bilateral 2008-2010   right-left  . WRIST TENDON TRANSFER Bilateral 1972-1973   left-right     reports that he quit smoking about 36 years ago. His smoking use included cigarettes. He has a 10.00 pack-year smoking history. He has quit using smokeless tobacco.  His smokeless tobacco use included chew. He reports previous alcohol use. He reports that he does not use drugs.  Allergies  Allergen Reactions  . Cozaar [Losartan Potassium] Other (See Comments)    Stopped due to kidney decline 4/18  . Kayexalate  [Polystyrene] Other (See Comments)    Abnormal ekg  . Altace [Ramipril] Cough    Chronic cough  . Other Swelling    "Symvisc"   . Cholestyramine Light Other (See Comments)    UNSPECIFIED REACTION d  . Sulfa Antibiotics Rash    Family History  Problem Relation Age of Onset  . Heart disease Mother   . Cancer Father      Prior to Admission medications   Medication Sig Start Date End Date Taking? Authorizing Provider  acetaminophen (TYLENOL) 650 MG CR tablet Take 650 mg by mouth every 8 (eight) hours as needed for pain.    [provider]  calcium acetate (PHOSLO) 667 MG capsule Take 1 capsule (667 mg total) by mouth 3 (three) times daily with meals. Patient taking differently: Take 667 mg by mouth daily.  08/08/16   Ledell Noss, MD  cetirizine (ZYRTEC) 10 MG tablet Take 10 mg by mouth at bedtime.    [provider]  colchicine 0.6 MG tablet TAKE 1 TABLET BY MOUTH ONCE DAILY AS NEEDED FOR GOUT FLARE UPS 11/15/18   Bo Merino, MD  DULoxetine (CYMBALTA) 30 MG capsule TAKE 1 CAPSULE BY MOUTH ONCE DAILY DO NOT CRUSH  OR CHEW 05/05/18   [provider]  febuxostat (ULORIC) 40 MG tablet Take 1/2 (one-half) tablet by mouth once daily 11/03/18   Bo Merino, MD  folic acid-vitamin b complex-vitamin c-selenium-zinc (DIALYVITE) 3 MG TABS tablet Take 1 tablet by mouth every evening.     [provider]  gabapentin (NEURONTIN) 300 MG capsule Take 300 mg by mouth at bedtime.     [provider]  ibuprofen (ADVIL,MOTRIN) 200 MG tablet Take 400 mg by mouth every 6 (six) hours as needed for mild pain.     [provider]  midodrine (PROAMATINE) 10 MG tablet Take 10 mg by mouth every Monday, Wednesday, and Friday. 30 minutes before dialysis    [provider]  ranitidine (ZANTAC) 150 MG tablet Take 150 mg by mouth every morning.     [provider]  sevelamer carbonate (RENVELA) 800 MG tablet TAKE 3 TABLETS BY MOUTH THREE TIMES  DAILY WITH MEALS 06/29/18   [provider]  VITAMIN E PO Take by mouth daily.    [provider]    Physical Exam: Vitals:   02/19/19 2045 02/19/19 2100 02/19/19 2115 02/19/19 2125  BP: (!) 98/50 (!) 107/48 (!) 104/54   Pulse: 95 95 94   Resp: 14 20 18    Temp:    (!) 100.8 F (38.2 C)  TempSrc:    Oral  SpO2: 98% 98% 95%     Constitutional: NAD, calm  Eyes: PERTLA, lids and conjunctivae normal ENMT: Mucous membranes are moist. Posterior pharynx clear of any exudate or lesions.   Neck: normal, supple, no masses, no thyromegaly Respiratory: no wheezing, no crackles. No accessory muscle use.  Cardiovascular: S1 & S2 heard, regular rate and rhythm. No extremity edema.   Abdomen: No distension, no tenderness, soft. Bowel sounds active.  Musculoskeletal: no clubbing / cyanosis. No joint deformity upper and lower extremities.    Skin: no significant rashes, lesions, ulcers. Warm, dry, well-perfused. Neurologic: CN 2-12 grossly intact. Sensation intact. Strength 5/5 in all 4 limbs.  Psychiatric: Alert and oriented to person, place, and day/month/year. Calm, cooperative.      Labs on Admission: I have personally reviewed following labs and imaging studies  CBC: Recent Labs  Lab 02/19/19 1835 02/19/19 2043  WBC 4.8  --   NEUTROABS 3.7  --   HGB 11.5* 11.9*  HCT 35.5* 35.0*  MCV 99.2  --   PLT 170  --    Basic Metabolic Panel: Recent Labs  Lab 02/19/19 1835 02/19/19 2043  NA 133* 129*  K 4.9 4.7  CL 91* 92*  CO2 24  --   GLUCOSE 129* 98  BUN 30* 33*  CREATININE 8.70* 9.70*  CALCIUM 9.1  --    GFR: CrCl cannot be calculated (Unknown ideal weight.). Liver Function Tests: Recent Labs  Lab 02/19/19 1835  AST 44*  ALT 47*  ALKPHOS 64  BILITOT 0.8  PROT 8.2*  ALBUMIN 3.3*   No results for input(s): LIPASE, AMYLASE in the last 168 hours. No results for input(s): AMMONIA in the last 168 hours. Coagulation Profile: Recent Labs  Lab  02/19/19 1835  INR 1.0   Cardiac Enzymes: No results for input(s): CKTOTAL, CKMB, CKMBINDEX, TROPONINI in the last 168 hours. BNP (last 3 results) No results for input(s): PROBNP in the last 8760 hours. HbA1C: No results for input(s): HGBA1C in the last 72 hours. CBG: Recent Labs  Lab 02/19/19 1850  GLUCAP 106*   Lipid Profile: Recent Labs  02/19/19 1835  TRIG 135   Thyroid Function Tests: No results for input(s): TSH, T4TOTAL, FREET4, T3FREE, THYROIDAB in the last 72 hours. Anemia Panel: Recent Labs    02/19/19 1835  FERRITIN 3,199*   Urine analysis:    Component Value Date/Time   COLORURINE STRAW (A) 08/03/2016 1007   APPEARANCEUR CLEAR 08/03/2016 1007   LABSPEC 1.009 08/03/2016 1007   PHURINE 5.0 08/03/2016 1007   GLUCOSEU NEGATIVE 08/03/2016 1007   HGBUR SMALL (A) 08/03/2016 1007   BILIRUBINUR NEGATIVE 08/03/2016 1007   KETONESUR NEGATIVE 08/03/2016 1007   PROTEINUR 100 (A) 08/03/2016 1007   UROBILINOGEN 0.2 07/18/2013 1248   NITRITE NEGATIVE 08/03/2016 1007   LEUKOCYTESUR NEGATIVE 08/03/2016 1007   Sepsis Labs: @LABRCNTIP (procalcitonin:4,lacticidven:4) )No results found for this or any previous visit (from the past 240 hour(s)).   Radiological Exams on Admission: CT Code Stroke CTA Head W/WO contrast  Result Date: 02/19/2019 CLINICAL DATA:  Dialysis patient with acute mental status changes. EXAM: CT ANGIOGRAPHY HEAD AND NECK CT PERFUSION BRAIN TECHNIQUE: Multidetector CT imaging of the head and neck was performed using the standard protocol during bolus administration of intravenous contrast. Multiplanar CT image reconstructions and MIPs were obtained to evaluate the vascular anatomy. Carotid stenosis measurements (when applicable) are obtained utilizing NASCET criteria, using the distal internal carotid diameter as the denominator. Multiphase CT imaging of the brain was performed following IV bolus contrast injection. Subsequent parametric perfusion maps  were calculated using RAPID software. CONTRAST:  196mL OMNIPAQUE IOHEXOL 350 MG/ML SOLN COMPARISON:  Head CT earlier same day. FINDINGS: CTA NECK FINDINGS Aortic arch: Aortic atherosclerosis. No aneurysm or dissection. Branching pattern is normal. Right carotid system: Common carotid artery widely patent to the bifurcation. Mild atherosclerotic plaque at the ICA bulb but no stenosis. Cervical ICA widely patent beyond that. Left carotid system: Common carotid artery widely patent to the bifurcation. Mild atherosclerotic plaque at the bifurcation but no stenosis. Cervical ICA widely patent beyond that. Vertebral arteries: Both vertebral artery origins show some adjacent plaque but no stenosis. Left vertebral artery is dominant. Both vertebral arteries widely patent through the cervical region to the foramen magnum. Skeleton: Ordinary cervical spondylosis. Other neck: No mass or lymphadenopathy. Upper chest: Patchy pulmonary infiltrates consistent with pneumonia. Patient with recent coronavirus diagnosis. Review of the MIP images confirms the above findings CTA HEAD FINDINGS Anterior circulation: Both internal carotid arteries are patent through the skull base and siphon regions. Siphon atherosclerotic calcification but without stenosis greater than 30%. Anterior and middle cerebral vessels are patent without proximal stenosis, aneurysm or vascular malformation. No large or medium vessel occlusion. Posterior circulation: Both vertebral arteries are widely patent to the basilar. No basilar stenosis. Posterior circulation branch vessels are patent and unremarkable. Venous sinuses: Patent and normal. Anatomic variants: None significant. Review of the MIP images confirms the above findings CT Brain Perfusion Findings: ASPECTS: 10 CBF (<30%) Volume: 51mL Perfusion (Tmax>6.0s) volume: 26mL reported by the program. I think this is artifactual and related to patient motion. This is bilateral and symmetric affecting the inferior  cortex. Mismatch Volume: 40mL reported, but in actuality I do not believe there is an acute insult. Infarction Location:None IMPRESSION: No large or medium vessel occlusion. Ordinary mild atherosclerosis of the carotid bifurcations and carotid siphons but without flow limiting stenosis. Perfusion imaging does not show an infarction. I do not believe there is any true at risk brain. There is a good deal of motion during this examination, explaining the T-max data, which I believe is  artifactual. Case discussed by telephone with Dr. Malen Gauze at 1932 hours. Electronically Signed   By: Nelson Chimes M.D.   On: 02/19/2019 19:39   CT Code Stroke CTA Neck W/WO contrast  Result Date: 02/19/2019 CLINICAL DATA:  Dialysis patient with acute mental status changes. EXAM: CT ANGIOGRAPHY HEAD AND NECK CT PERFUSION BRAIN TECHNIQUE: Multidetector CT imaging of the head and neck was performed using the standard protocol during bolus administration of intravenous contrast. Multiplanar CT image reconstructions and MIPs were obtained to evaluate the vascular anatomy. Carotid stenosis measurements (when applicable) are obtained utilizing NASCET criteria, using the distal internal carotid diameter as the denominator. Multiphase CT imaging of the brain was performed following IV bolus contrast injection. Subsequent parametric perfusion maps were calculated using RAPID software. CONTRAST:  144mL OMNIPAQUE IOHEXOL 350 MG/ML SOLN COMPARISON:  Head CT earlier same day. FINDINGS: CTA NECK FINDINGS Aortic arch: Aortic atherosclerosis. No aneurysm or dissection. Branching pattern is normal. Right carotid system: Common carotid artery widely patent to the bifurcation. Mild atherosclerotic plaque at the ICA bulb but no stenosis. Cervical ICA widely patent beyond that. Left carotid system: Common carotid artery widely patent to the bifurcation. Mild atherosclerotic plaque at the bifurcation but no stenosis. Cervical ICA widely patent beyond that.  Vertebral arteries: Both vertebral artery origins show some adjacent plaque but no stenosis. Left vertebral artery is dominant. Both vertebral arteries widely patent through the cervical region to the foramen magnum. Skeleton: Ordinary cervical spondylosis. Other neck: No mass or lymphadenopathy. Upper chest: Patchy pulmonary infiltrates consistent with pneumonia. Patient with recent coronavirus diagnosis. Review of the MIP images confirms the above findings CTA HEAD FINDINGS Anterior circulation: Both internal carotid arteries are patent through the skull base and siphon regions. Siphon atherosclerotic calcification but without stenosis greater than 30%. Anterior and middle cerebral vessels are patent without proximal stenosis, aneurysm or vascular malformation. No large or medium vessel occlusion. Posterior circulation: Both vertebral arteries are widely patent to the basilar. No basilar stenosis. Posterior circulation branch vessels are patent and unremarkable. Venous sinuses: Patent and normal. Anatomic variants: None significant. Review of the MIP images confirms the above findings CT Brain Perfusion Findings: ASPECTS: 10 CBF (<30%) Volume: 42mL Perfusion (Tmax>6.0s) volume: 43mL reported by the program. I think this is artifactual and related to patient motion. This is bilateral and symmetric affecting the inferior cortex. Mismatch Volume: 33mL reported, but in actuality I do not believe there is an acute insult. Infarction Location:None IMPRESSION: No large or medium vessel occlusion. Ordinary mild atherosclerosis of the carotid bifurcations and carotid siphons but without flow limiting stenosis. Perfusion imaging does not show an infarction. I do not believe there is any true at risk brain. There is a good deal of motion during this examination, explaining the T-max data, which I believe is artifactual. Case discussed by telephone with Dr. Malen Gauze at 1932 hours. Electronically Signed   By: Nelson Chimes M.D.    On: 02/19/2019 19:39   CT Code Stroke Cerebral Perfusion with contrast  Result Date: 02/19/2019 CLINICAL DATA:  Dialysis patient with acute mental status changes. EXAM: CT ANGIOGRAPHY HEAD AND NECK CT PERFUSION BRAIN TECHNIQUE: Multidetector CT imaging of the head and neck was performed using the standard protocol during bolus administration of intravenous contrast. Multiplanar CT image reconstructions and MIPs were obtained to evaluate the vascular anatomy. Carotid stenosis measurements (when applicable) are obtained utilizing NASCET criteria, using the distal internal carotid diameter as the denominator. Multiphase CT imaging of the brain was performed following  IV bolus contrast injection. Subsequent parametric perfusion maps were calculated using RAPID software. CONTRAST:  174mL OMNIPAQUE IOHEXOL 350 MG/ML SOLN COMPARISON:  Head CT earlier same day. FINDINGS: CTA NECK FINDINGS Aortic arch: Aortic atherosclerosis. No aneurysm or dissection. Branching pattern is normal. Right carotid system: Common carotid artery widely patent to the bifurcation. Mild atherosclerotic plaque at the ICA bulb but no stenosis. Cervical ICA widely patent beyond that. Left carotid system: Common carotid artery widely patent to the bifurcation. Mild atherosclerotic plaque at the bifurcation but no stenosis. Cervical ICA widely patent beyond that. Vertebral arteries: Both vertebral artery origins show some adjacent plaque but no stenosis. Left vertebral artery is dominant. Both vertebral arteries widely patent through the cervical region to the foramen magnum. Skeleton: Ordinary cervical spondylosis. Other neck: No mass or lymphadenopathy. Upper chest: Patchy pulmonary infiltrates consistent with pneumonia. Patient with recent coronavirus diagnosis. Review of the MIP images confirms the above findings CTA HEAD FINDINGS Anterior circulation: Both internal carotid arteries are patent through the skull base and siphon regions. Siphon  atherosclerotic calcification but without stenosis greater than 30%. Anterior and middle cerebral vessels are patent without proximal stenosis, aneurysm or vascular malformation. No large or medium vessel occlusion. Posterior circulation: Both vertebral arteries are widely patent to the basilar. No basilar stenosis. Posterior circulation branch vessels are patent and unremarkable. Venous sinuses: Patent and normal. Anatomic variants: None significant. Review of the MIP images confirms the above findings CT Brain Perfusion Findings: ASPECTS: 10 CBF (<30%) Volume: 45mL Perfusion (Tmax>6.0s) volume: 98mL reported by the program. I think this is artifactual and related to patient motion. This is bilateral and symmetric affecting the inferior cortex. Mismatch Volume: 45mL reported, but in actuality I do not believe there is an acute insult. Infarction Location:None IMPRESSION: No large or medium vessel occlusion. Ordinary mild atherosclerosis of the carotid bifurcations and carotid siphons but without flow limiting stenosis. Perfusion imaging does not show an infarction. I do not believe there is any true at risk brain. There is a good deal of motion during this examination, explaining the T-max data, which I believe is artifactual. Case discussed by telephone with Dr. Malen Gauze at 1932 hours. Electronically Signed   By: Nelson Chimes M.D.   On: 02/19/2019 19:39   DG Chest Port 1 View  Result Date: 02/19/2019 CLINICAL DATA:  Shortness of breath, dialysis, altered mental status EXAM: PORTABLE CHEST 1 VIEW COMPARISON:  08/04/2016 FINDINGS: Mild cardiomegaly. Both lungs are clear. The visualized skeletal structures are unremarkable. IMPRESSION: Mild cardiomegaly without acute abnormality of the lungs in AP portable projection. Electronically Signed   By: Thanh Candle M.D.   On: 02/19/2019 20:33   CT HEAD CODE STROKE WO CONTRAST  Result Date: 02/19/2019 CLINICAL DATA:  Code stroke. Dialysis patient with expressive aphasia.  EXAM: CT HEAD WITHOUT CONTRAST TECHNIQUE: Contiguous axial images were obtained from the base of the skull through the vertex without intravenous contrast. COMPARISON:  None. FINDINGS: Brain: The study suffers from motion degradation. Allowing for that, there is no evidence of old or acute infarction, mass lesion, hemorrhage, hydrocephalus or extra-axial collection. Vascular: There is atherosclerotic calcification of the major vessels at the base of the brain. Skull: Negative Sinuses/Orbits: Clear/normal Other: None ASPECTS ([place] Stroke Program Early CT Score) - Ganglionic level infarction (caudate, lentiform nuclei, internal capsule, insula, M1-M3 cortex): 7 - Supraganglionic infarction (M4-M6 cortex): 3 Total score (0-10 with 10 being normal): 10 IMPRESSION: 1. Motion degraded but otherwise normal examination. 2. ASPECTS is 10. 3. These  results were communicated to Dr. Rory Percy at [place] 1/3/2021by text page via the [facility] messaging system. Electronically Signed   By: Nelson Chimes M.D.   On: 02/19/2019 19:12    EKG: Independently reviewed. Sinus rhythm, RBBB, LAFB, no significant changes.   Assessment/Plan   1. Acute encephalopathy  - Presents from HD with AMS and reported dysarthria and left-sided weakness  - Neurology consulting and much appreciated  - Head CT negative for acute findings, no lg- or med-vessel occlusion on CTA, no focal weakness detected on exam, and AMS seems to have resolved in ED  - Toxic-metabolic encephalopathy suspected  - Continue neuro checks, check MRI brain, no further CVA/TIA workup unless MRI positive    2. COVID-19; acute hypoxic respiratory failure  - Patient reports exposure to family with COVID-19, developed mild cough and loose stools a few days ago, has had fevers at HD, and was saturating 89% on rm air in ED with normal RR and no definite infiltrates on CXR  - He was 89% on rm air, placed on 10 Lpm via NRB, turned down to 4 Lpm and saturation still mid-upper  90's  - Procalcitonin is 0.65, will cover with Rocephin and azithromycin, follow blood cultures, check sputum culture, trend procalcitonin  - Start Decadron and remdesivir, titrate supplemental O2 to keep sat >90% while awake, trend markers    3. ESRD  - Completed HD on 02/19/19  - No uremia, acidosis, significant volume overload or hypertension on admission  - SLIV, renally-dose medication, contact nephrology for maintenance HD if he remains in hospital 02/21/19   4. Gout  - Stable, pharmacy medication-reconciliation pending   5. Elevated transaminases  - Slight elevations in AST and ALT on admission with normal bilirubin and benign abdominal exam  - Likely secondary to COVID infection, will monitor while on remdesivir    6. Elevated d-dimer  - No chest pain, hemoptysis, or evidence for DVT on admission  - Elevation likely secondary to COVID-19 infection, will trend    DVT prophylaxis: sq heparin  Code Status: Full  Family Communication: Discussed with patient  Consults called: Neurology  Admission status: Observation. Patient did not have supplemental O2 requirement when order placed for observation; he is now requiring supplemental O2 at rest and may require inpatient management.     Vianne Bulls, MD [facility] Pager [phone]  If 7PM-7AM, please contact night-coverage [email or web address] Password [facility]  02/19/2019, 9:26 PM

## 2019-02-19 NOTE — ED Notes (Signed)
Patient transported to MRI 

## 2019-02-19 NOTE — Consult Note (Addendum)
Neurology Consultation  Reason for Consult: Code stroke for aphasia left-sided weakness Referring Physician: Dr. Bobby Rumpf  CC: Aphasia left-sided weakness  History is obtained from: Chart, ED provider, patient's wife on phone  HPI: Richard Bush is a 69 y.o. male past medical history of ESRD on dialysis Monday Wednesday Friday, hypertension, neuropathy, hyperlipidemia presented to the emergency room from a dialysis center for evaluation of altered mental status. He was tested positive for Covid 19 on New Year's Day-test was done because he was febrile on prior dialysis. Today he drove himself to dialysis, and was noted to have at some point been not acting like himself. Unknown clear last known normal port nursing reports 1:30 PM was last normal. Evaluated by the ED provider.  Some evidence of aphasia along with some left arm drift noted on initial ED evaluation for which a LVO positive code stroke was called. Patient has a nonfocal exam on my examination.  According to wife, he has been sick for the past few days.  She said he has fevers, chills, diarrhea.  His appetite has been poor.  He is not been complaining of any excessive pain.  No chest pain, shortness of breath, palpitations.  Alert his wife he also has a history of having blood pressure radiations and fluctuations in dialysis that caused him to be lightheaded and confused at times.   LKW: 1:30 PM at best tpa given?: no, outside the window Premorbid modified Rankin scale (mRS): 2  ROS: Pertinent positives as documented in the HPI.  Rest of the review negative  Past Medical History:  Diagnosis Date  . Anemia   . Chronic right hip pain   . ESRD (end stage renal disease) on dialysis [facility])    dialysis M/W/F- [facility] (01/06/2017)  . GERD (gastroesophageal reflux disease)   . Gout   . High cholesterol   . Hypertension   . Neuropathy   . Osteoarthritis    "all over" (01/06/2017)  . Pleurisy   . Pneumonia 1980s X 1      Family History  Problem Relation Age of Onset  . Heart disease Mother   . Cancer Father     Social History:   reports that he quit smoking about 36 years ago. His smoking use included cigarettes. He has a 10.00 pack-year smoking history. He has quit using smokeless tobacco.  His smokeless tobacco use included chew. He reports previous alcohol use. He reports that he does not use drugs.  Medications  Current Facility-Administered Medications:  .  acetaminophen (TYLENOL) suppository 650 mg, 650 mg, Rectal, Once, Fredia Sorrow, MD  Current Outpatient Medications:  .  acetaminophen (TYLENOL) 650 MG CR tablet, Take 650 mg by mouth every 8 (eight) hours as needed for pain., Disp: , Rfl:  .  calcium acetate (PHOSLO) 667 MG capsule, Take 1 capsule (667 mg total) by mouth 3 (three) times daily with meals. (Patient taking differently: Take 667 mg by mouth daily. ), Disp: 90 capsule, Rfl: 0 .  cetirizine (ZYRTEC) 10 MG tablet, Take 10 mg by mouth at bedtime., Disp: , Rfl:  .  colchicine 0.6 MG tablet, TAKE 1 TABLET BY MOUTH ONCE DAILY AS NEEDED FOR GOUT FLARE UPS, Disp: 90 tablet, Rfl: 0 .  DULoxetine (CYMBALTA) 30 MG capsule, TAKE 1 CAPSULE BY MOUTH ONCE DAILY DO NOT CRUSH OR CHEW, Disp: , Rfl:  .  febuxostat (ULORIC) 40 MG tablet, Take 1/2 (one-half) tablet by mouth once daily, Disp: 90 tablet, Rfl: 0 .  folic  acid-vitamin b complex-vitamin c-selenium-zinc (DIALYVITE) 3 MG TABS tablet, Take 1 tablet by mouth every evening. , Disp: , Rfl:  .  gabapentin (NEURONTIN) 300 MG capsule, Take 300 mg by mouth at bedtime. , Disp: , Rfl:  .  ibuprofen (ADVIL,MOTRIN) 200 MG tablet, Take 400 mg by mouth every 6 (six) hours as needed for mild pain. , Disp: , Rfl:  .  midodrine (PROAMATINE) 10 MG tablet, Take 10 mg by mouth every Monday, Wednesday, and Friday. 30 minutes before dialysis, Disp: , Rfl:  .  ranitidine (ZANTAC) 150 MG tablet, Take 150 mg by mouth every morning. , Disp: , Rfl:  .  sevelamer  carbonate (RENVELA) 800 MG tablet, TAKE 3 TABLETS BY MOUTH THREE TIMES DAILY WITH MEALS, Disp: , Rfl:  .  VITAMIN E PO, Take by mouth daily., Disp: , Rfl:    Exam: Current vital signs: BP (!) 103/45   Pulse (!) 107   Temp (!) 102.3 F (39.1 C) (Oral)   Resp 18   SpO2 91%  Vital signs in last 24 hours: Temp:  [102.3 F (39.1 C)] 102.3 F (39.1 C) (01/03 1830) Pulse Rate:  [107] 107 (01/03 1830) Resp:  [14-18] 18 (01/03 1930) BP: (103-121)/(45-55) 103/45 (01/03 1930) SpO2:  [91 %] 91 % (01/03 1843) General: Awake alert in no distress HEENT: Normocephalic atraumati Lungs: Slightly increased work of breathing, scattered rales Cardiovascular: Regular rate rhythm Abdomen: Soft nondistended nontender Extremities: Trace edema Neurological exam Awake alert oriented x3 Speech is not dysarthric, naming comprehension repetition intact. Cranial nerves: Pupils equal round reactive to light, extraocular movements intact, visual fields full, facial sensation and face symmetric.  Auditory acuity intact.  Tongue and palate midline. Motor exam: Antigravity in all fours with mild asterixis in bilateral upper extremities Sensory exam: Intact to light touch Coordination: No dysmetria NIH stroke scale-0  Labs I have reviewed labs in epic and the results pertinent to this consultation are:  CBC    Component Value Date/Time   WBC 4.8 02/19/2019 1835   RBC 3.58 (L) 02/19/2019 1835   HGB 11.5 (L) 02/19/2019 1835   HGB 12.0 (L) 01/09/2019 1158   HCT 35.5 (L) 02/19/2019 1835   HCT 34.9 (L) 01/09/2019 1158   PLT 170 02/19/2019 1835   PLT 223 01/09/2019 1158   MCV 99.2 02/19/2019 1835   MCV 96 01/09/2019 1158   MCH 32.1 02/19/2019 1835   MCHC 32.4 02/19/2019 1835   RDW 13.1 02/19/2019 1835   RDW 13.1 01/09/2019 1158   LYMPHSABS 0.8 02/19/2019 1835   LYMPHSABS 1.3 01/09/2019 1158   MONOABS 0.3 02/19/2019 1835   EOSABS 0.0 02/19/2019 1835   EOSABS 0.2 01/09/2019 1158   BASOSABS 0.0  02/19/2019 1835   BASOSABS 0.0 01/09/2019 1158    CMP     Component Value Date/Time   NA 133 (L) 02/19/2019 1835   NA 140 01/09/2019 1158   K 4.9 02/19/2019 1835   CL 91 (L) 02/19/2019 1835   CO2 24 02/19/2019 1835   GLUCOSE 129 (H) 02/19/2019 1835   BUN 30 (H) 02/19/2019 1835   BUN 41 (H) 01/09/2019 1158   CREATININE 8.70 (H) 02/19/2019 1835   CALCIUM 9.1 02/19/2019 1835   PROT 8.2 (H) 02/19/2019 1835   PROT 6.9 01/09/2019 1158   ALBUMIN 3.3 (L) 02/19/2019 1835   ALBUMIN 4.1 01/09/2019 1158   AST 44 (H) 02/19/2019 1835   ALT 47 (H) 02/19/2019 1835   ALKPHOS 64 02/19/2019 1835   BILITOT 0.8  02/19/2019 1835   BILITOT 0.3 01/09/2019 1158   GFRNONAA 6 (L) 02/19/2019 1835   GFRAA 6 (L) 02/19/2019 1835    Imaging I have reviewed the images obtained:  CT-scan of the brain-no acute changes-cardiac motion riddled scan. CTA and CTP with no acute emergent large vessel occlusion or perfusion deficit.  There is artifactual perfusion deficit on the CT perfusion images.  Discussed with neuro rads-no clear clinical significance.  Not correlated with clinical exam.   Assessment:  69 year old Covid positive male brought in from dialysis for sudden onset of altered mental status. Details from the facility are not available.  According to the nursing report, they were told that he was not acting like himself and appeared altered. On initial exam he was noted to have some aphasia and left arm drift in the emergency department but on my examination, his exam was nonfocal. CT head, CT angiogram head and neck and CT perfusion studies were unremarkable for lateralized vascular insufficiency or evolving stroke or bleed. I suspect his symptoms are related to his current Covid infection as well as dialysis disequilibrium syndrome plus minus some component of toxic metabolic encephalopathy  Not a candidate for TPA anyway because he presented outside the window No evidence of large vessel occlusion  on imaging hence not a candidate for endovascular thrombectomy  Impression: Toxic metabolic encephalopathy-multifactorial including infection with Covid and dialysis. Dialysis disequilibrium syndrome.  Recommendations: From a neurological standpoint, obtain MRI brain without contrast when able to. Stroke work-up should only be done if MRI is positive for stroke. Otherwise, further management per medicine team's for the toxic metabolic derangements and COVID-19 related pneumonia and fevers. I do not suspect a CNS etiology and a spinal tap is not needed at this time. Spinal tap can be considered if there are any encephalopathic or focal neurological features on exam going forward.  Please recall neurology at the time.  Neurology will be available as needed.  Please call with questions.  -- Amie Portland, MD [facility] Pager: [phone] If 7pm to 7am, please call on call as listed on AMION.   CRITICAL CARE ATTESTATION Performed by: Amie Portland, MD Total critical care time: 59 minutes Critical care time was exclusive of separately billable procedures and treating other patients and/or supervising APPs/Residents/Students Critical care was necessary to treat or prevent imminent or life-threatening deterioration due to metabolic encephalopathy, strokelike symptoms. This patient is critically ill and at significant risk for neurological worsening and/or death and care requires constant monitoring. Critical care was time spent personally by me on the following activities: development of treatment plan with patient and/or surrogate as well as nursing, discussions with consultants, evaluation of patient's response to treatment, examination of patient, obtaining history from patient or surrogate, ordering and performing treatments and interventions, ordering and review of laboratory studies, ordering and review of radiographic studies, pulse oximetry, re-evaluation of patient's  condition, participation in multidisciplinary rounds and medical decision making of high complexity in the care of this patient.

## 2019-02-19 NOTE — ED Triage Notes (Addendum)
Pt arrives from dialysis via [facility] c/o AMS, time of onset unknown. On arrival Pt alert to self, knows age, some expressive aphasia noted.  Pt AOx4 at start of dialysis, estimated 1330. Dr. Rogene Houston made aware..Per pt Covid +

## 2019-02-19 NOTE — Progress Notes (Signed)
Unable to start MRI, patient had trouble laying down to begin with, then could not hold still and began complaining about right shoulder pain. Was able to get him situated and into scanner but before any sequences could be ran pt stopped exam and yelled to be taken out. Pt complained of shoulder pain and asked if we had anything to numb his shoulders. We informed him we did not. Pt agreed to try again, got pt into scanner and pt squeezed the ball to stop again before anything could be ran and yelled to be taken out. Scan not performed, pt returned to ED and RN informed

## 2019-02-20 DIAGNOSIS — I452 Bifascicular block: Secondary | ICD-10-CM | POA: Diagnosis present

## 2019-02-20 DIAGNOSIS — E785 Hyperlipidemia, unspecified: Secondary | ICD-10-CM | POA: Diagnosis present

## 2019-02-20 DIAGNOSIS — R4701 Aphasia: Secondary | ICD-10-CM | POA: Diagnosis present

## 2019-02-20 DIAGNOSIS — J1282 Pneumonia due to coronavirus disease 2019: Secondary | ICD-10-CM | POA: Diagnosis present

## 2019-02-20 DIAGNOSIS — G9340 Encephalopathy, unspecified: Secondary | ICD-10-CM | POA: Diagnosis not present

## 2019-02-20 DIAGNOSIS — M1A00X0 Idiopathic chronic gout, unspecified site, without tophus (tophi): Secondary | ICD-10-CM | POA: Diagnosis present

## 2019-02-20 DIAGNOSIS — R5381 Other malaise: Secondary | ICD-10-CM | POA: Diagnosis present

## 2019-02-20 DIAGNOSIS — N2581 Secondary hyperparathyroidism of renal origin: Secondary | ICD-10-CM | POA: Diagnosis present

## 2019-02-20 DIAGNOSIS — N186 End stage renal disease: Secondary | ICD-10-CM | POA: Diagnosis present

## 2019-02-20 DIAGNOSIS — K219 Gastro-esophageal reflux disease without esophagitis: Secondary | ICD-10-CM | POA: Diagnosis present

## 2019-02-20 DIAGNOSIS — Z888 Allergy status to other drugs, medicaments and biological substances status: Secondary | ICD-10-CM | POA: Diagnosis not present

## 2019-02-20 DIAGNOSIS — Z992 Dependence on renal dialysis: Secondary | ICD-10-CM | POA: Diagnosis not present

## 2019-02-20 DIAGNOSIS — Z882 Allergy status to sulfonamides status: Secondary | ICD-10-CM | POA: Diagnosis not present

## 2019-02-20 DIAGNOSIS — Z8249 Family history of ischemic heart disease and other diseases of the circulatory system: Secondary | ICD-10-CM | POA: Diagnosis not present

## 2019-02-20 DIAGNOSIS — E871 Hypo-osmolality and hyponatremia: Secondary | ICD-10-CM | POA: Diagnosis present

## 2019-02-20 DIAGNOSIS — J9601 Acute respiratory failure with hypoxia: Secondary | ICD-10-CM | POA: Diagnosis present

## 2019-02-20 DIAGNOSIS — U071 COVID-19: Secondary | ICD-10-CM | POA: Diagnosis present

## 2019-02-20 DIAGNOSIS — I9589 Other hypotension: Secondary | ICD-10-CM | POA: Diagnosis present

## 2019-02-20 DIAGNOSIS — M1990 Unspecified osteoarthritis, unspecified site: Secondary | ICD-10-CM | POA: Diagnosis present

## 2019-02-20 DIAGNOSIS — R7989 Other specified abnormal findings of blood chemistry: Secondary | ICD-10-CM | POA: Diagnosis present

## 2019-02-20 DIAGNOSIS — G629 Polyneuropathy, unspecified: Secondary | ICD-10-CM | POA: Diagnosis present

## 2019-02-20 DIAGNOSIS — Z6841 Body Mass Index (BMI) 40.0 and over, adult: Secondary | ICD-10-CM | POA: Diagnosis not present

## 2019-02-20 DIAGNOSIS — G92 Toxic encephalopathy: Secondary | ICD-10-CM | POA: Diagnosis present

## 2019-02-20 DIAGNOSIS — I1311 Hypertensive heart and chronic kidney disease without heart failure, with stage 5 chronic kidney disease, or end stage renal disease: Secondary | ICD-10-CM | POA: Diagnosis present

## 2019-02-20 DIAGNOSIS — Y9289 Other specified places as the place of occurrence of the external cause: Secondary | ICD-10-CM | POA: Diagnosis not present

## 2019-02-20 HISTORY — DX: Pneumonia due to coronavirus disease 2019: J12.82

## 2019-02-20 HISTORY — DX: COVID-19: U07.1

## 2019-02-20 LAB — COMPREHENSIVE METABOLIC PANEL
ALT: 43 U/L (ref 0–44)
AST: 43 U/L — ABNORMAL HIGH (ref 15–41)
Albumin: 3 g/dL — ABNORMAL LOW (ref 3.5–5.0)
Alkaline Phosphatase: 58 U/L (ref 38–126)
Anion gap: 17 — ABNORMAL HIGH (ref 5–15)
BUN: 43 mg/dL — ABNORMAL HIGH (ref 8–23)
CO2: 25 mmol/L (ref 22–32)
Calcium: 9 mg/dL (ref 8.9–10.3)
Chloride: 89 mmol/L — ABNORMAL LOW (ref 98–111)
Creatinine, Ser: 10.73 mg/dL — ABNORMAL HIGH (ref 0.61–1.24)
GFR calc Af Amer: 5 mL/min — ABNORMAL LOW (ref >60)
GFR calc non Af Amer: 4 mL/min — ABNORMAL LOW (ref >60)
Glucose, Bld: 132 mg/dL — ABNORMAL HIGH (ref 70–99)
Potassium: 5.7 mmol/L — ABNORMAL HIGH (ref 3.5–5.1)
Sodium: 131 mmol/L — ABNORMAL LOW (ref 135–145)
Total Bilirubin: 0.4 mg/dL (ref 0.3–1.2)
Total Protein: 7.4 g/dL (ref 6.5–8.1)

## 2019-02-20 LAB — CBC WITH DIFFERENTIAL/PLATELET
Abs Immature Granulocytes: 0.01 10*3/uL (ref 0.00–0.07)
Basophils Absolute: 0 10*3/uL (ref 0.0–0.1)
Basophils Relative: 0 %
Eosinophils Absolute: 0 10*3/uL (ref 0.0–0.5)
Eosinophils Relative: 0 %
HCT: 35 % — ABNORMAL LOW (ref 39.0–52.0)
Hemoglobin: 11.2 g/dL — ABNORMAL LOW (ref 13.0–17.0)
Immature Granulocytes: 0 %
Lymphocytes Relative: 15 %
Lymphs Abs: 0.7 10*3/uL (ref 0.7–4.0)
MCH: 32.2 pg (ref 26.0–34.0)
MCHC: 32 g/dL (ref 30.0–36.0)
MCV: 100.6 fL — ABNORMAL HIGH (ref 80.0–100.0)
Monocytes Absolute: 0.2 10*3/uL (ref 0.1–1.0)
Monocytes Relative: 4 %
Neutro Abs: 3.5 10*3/uL (ref 1.7–7.7)
Neutrophils Relative %: 81 %
Platelets: 156 10*3/uL (ref 150–400)
RBC: 3.48 MIL/uL — ABNORMAL LOW (ref 4.22–5.81)
RDW: 13.2 % (ref 11.5–15.5)
WBC: 4.4 10*3/uL (ref 4.0–10.5)
nRBC: 0 % (ref 0.0–0.2)

## 2019-02-20 LAB — PROCALCITONIN: Procalcitonin: 0.83 ng/mL

## 2019-02-20 LAB — VITAMIN B12: Vitamin B-12: 1170 pg/mL — ABNORMAL HIGH (ref 180–914)

## 2019-02-20 LAB — HIV ANTIBODY (ROUTINE TESTING W REFLEX): HIV Screen 4th Generation wRfx: NONREACTIVE

## 2019-02-20 LAB — FERRITIN: Ferritin: 3086 ng/mL — ABNORMAL HIGH (ref 24–336)

## 2019-02-20 LAB — D-DIMER, QUANTITATIVE: D-Dimer, Quant: 2.1 ug/mL-FEU — ABNORMAL HIGH (ref 0.00–0.50)

## 2019-02-20 LAB — PHOSPHORUS: Phosphorus: 7.5 mg/dL — ABNORMAL HIGH (ref 2.5–4.6)

## 2019-02-20 LAB — MAGNESIUM: Magnesium: 2 mg/dL (ref 1.7–2.4)

## 2019-02-20 LAB — TSH: TSH: 2.069 u[IU]/mL (ref 0.350–4.500)

## 2019-02-20 LAB — C-REACTIVE PROTEIN: CRP: 10.3 mg/dL — ABNORMAL HIGH (ref <1.0)

## 2019-02-20 MED ORDER — FENTANYL CITRATE (PF) 100 MCG/2ML IJ SOLN: 25.0000 ug | INTRAMUSCULAR | Status: DC | PRN

## 2019-02-20 MED ORDER — GABAPENTIN 300 MG PO CAPS
300.0000 mg | ORAL_CAPSULE | Freq: Every evening | ORAL | Status: DC
  Administered 2019-02-20 – 2019-02-22 (×3): 300 mg via ORAL
  Filled 2019-02-20 (×3): qty 1

## 2019-02-20 MED ORDER — DEXTROSE 50 % IV SOLN
1.0000 | Freq: Once | INTRAVENOUS | Status: AC
  Administered 2019-02-20: 50 mL via INTRAVENOUS
  Filled 2019-02-20: qty 50

## 2019-02-20 MED ORDER — CHLORHEXIDINE GLUCONATE CLOTH 2 % EX PADS
6.0000 | MEDICATED_PAD | Freq: Every day | CUTANEOUS | Status: DC
  Administered 2019-02-21: 13:00:00 6 via TOPICAL

## 2019-02-20 MED ORDER — INSULIN ASPART 100 UNIT/ML ~~LOC~~ SOLN
4.0000 [IU] | Freq: Once | SUBCUTANEOUS | Status: AC
  Administered 2019-02-20: 09:00:00 4 [IU] via SUBCUTANEOUS

## 2019-02-20 MED ORDER — RENA-VITE PO TABS
1.0000 | ORAL_TABLET | Freq: Every evening | ORAL | Status: DC
  Administered 2019-02-20 – 2019-02-22 (×3): 1 via ORAL
  Filled 2019-02-20 (×3): qty 1

## 2019-02-20 MED ORDER — LORAZEPAM 2 MG/ML IJ SOLN: 1.0000 mg | Freq: Once | INTRAMUSCULAR | Status: DC | PRN

## 2019-02-20 MED ORDER — FEBUXOSTAT 40 MG PO TABS
20.0000 mg | ORAL_TABLET | Freq: Every day | ORAL | Status: DC
  Administered 2019-02-20 – 2019-02-24 (×5): 20 mg via ORAL
  Filled 2019-02-20 (×5): qty 1

## 2019-02-20 MED ORDER — SODIUM ZIRCONIUM CYCLOSILICATE 10 G PO PACK
10.0000 g | PACK | Freq: Once | ORAL | Status: AC
  Administered 2019-02-20: 10 g via ORAL
  Filled 2019-02-20: qty 1

## 2019-02-20 MED ORDER — SEVELAMER CARBONATE 800 MG PO TABS: 2400.0000 mg | ORAL_TABLET | Freq: Three times a day (TID) | ORAL | Status: DC

## 2019-02-20 MED ORDER — CALCIUM GLUCONATE-NACL 1-0.675 GM/50ML-% IV SOLN
1.0000 g | Freq: Once | INTRAVENOUS | Status: AC
  Administered 2019-02-20: 1000 mg via INTRAVENOUS
  Filled 2019-02-20: qty 50

## 2019-02-20 MED ORDER — SEVELAMER CARBONATE 800 MG PO TABS
2400.0000 mg | ORAL_TABLET | Freq: Three times a day (TID) | ORAL | Status: DC
  Administered 2019-02-20 – 2019-02-24 (×11): 2400 mg via ORAL
  Filled 2019-02-20 (×12): qty 3

## 2019-02-20 NOTE — ED Notes (Signed)
Pt has chronic pain in hi shoulders

## 2019-02-20 NOTE — ED Notes (Signed)
Food given 

## 2019-02-20 NOTE — ED Notes (Signed)
ED TO INPATIENT HANDOFF REPORT  ED Nurse Name and Phone #: Marye Round [phone]  S Name/Age/Gender Richard Bush 69 y.o. male Room/Bed: 009C/009C  Code Status   Code Status: Full Code  Home/SNF/Other Home Patient oriented to: self, place, time and situation Is this baseline? Yes   Triage Complete: Triage complete  Chief Complaint Acute encephalopathy [G93.40] Pneumonia due to COVID-19 virus [U07.1, J12.82]  Triage Note Pt arrives from dialysis via [facility] c/o AMS, time of onset unknown. On arrival Pt alert to self, knows age, some expressive aphasia noted.  Pt AOx4 at start of dialysis, estimated 1330. Dr. Rogene [place] made aware..Per pt Covid +    Allergies Allergies  Allergen Reactions  . Cozaar [Losartan Potassium] Other (See Comments)    Stopped due to kidney decline 4/18  . Kayexalate [Polystyrene] Other (See Comments)    Abnormal ekg  . Altace [Ramipril] Cough    Chronic cough  . Other Swelling    "Symvisc"   . Cholestyramine Light Other (See Comments)    UNSPECIFIED REACTION d  . Sulfa Antibiotics Rash    Level of Care/Admitting Diagnosis ED Disposition    ED Disposition Condition Comment   Admit  Hospital Area: [facility] [[id number]]  Level of Care: Telemetry Medical [104]  Covid Evaluation: Confirmed COVID Positive  Diagnosis: Pneumonia due to COVID-19 virus [[id number]]  Admitting Physician: Vianne Bulls [[id number]]  Attending Physician: Elmarie Shiley [phone]  Estimated length of stay: 3 - 4 days  Certification:: I certify this patient will need inpatient services for at least 2 midnights       B Medical/Surgery History Past Medical History:  Diagnosis Date  . Anemia   . Chronic right hip pain   . ESRD (end stage renal disease) on dialysis [facility])    dialysis M/W/F- [facility] (01/06/2017)  . GERD (gastroesophageal reflux disease)   . Gout   . High cholesterol   . Hypertension   . Neuropathy   . Osteoarthritis     "all over" (01/06/2017)  . Pleurisy   . Pneumonia 1980s X 1   Past Surgical History:  Procedure Laterality Date  . A/V FISTULAGRAM Left 12/24/2016   Procedure: A/V FISTULAGRAM;  Surgeon: Waynetta Sandy, MD;  Location: [facility];  Service: Cardiovascular;  Laterality: Left;  . APPENDECTOMY  1963  . AV FISTULA PLACEMENT Left 08/04/2016   Procedure: ARTERIOVENOUS (AV) FISTULA CREATION/LEFT ARM;  Surgeon: Serafina Mitchell, MD;  Location: [facility];  Service: Vascular;  Laterality: Left;  . AV FISTULA REPAIR Left 01/06/2017   arm  . BILATERAL CARPAL TUNNEL RELEASE Bilateral 2006-2007   right-left  . CHOLECYSTECTOMY OPEN  1979  . COLONOSCOPY    . FINGER SURGERY Right 2006   "thumb; took out bone that conects to wrist and a piece of tendon; weaved tendon back in for mobility"  . FISTULA SUPERFICIALIZATION Left 09/24/2016   Procedure: FISTULA SUPERFICIALIZATION- LEFT ARM;  Surgeon: Serafina Mitchell, MD;  Location: [facility];  Service: Vascular;  Laterality: Left;  . HAMMER TOE SURGERY Left 1981  . INSERTION OF DIALYSIS CATHETER Left 08/04/2016   Procedure: INSERTION OF DIALYSIS CATHETER;  Surgeon: Serafina Mitchell, MD;  Location: [facility];  Service: Vascular;  Laterality: Left;  . JOINT REPLACEMENT    . LIGATION OF ARTERIOVENOUS  FISTULA Left 01/21/2017   Procedure: REPAIR OF ARTERIOVENOUS  FISTULA;  Surgeon: Angelia Mould, MD;  Location: [facility];  Service: Vascular;  Laterality: Left;  . LUMBAR  LAMINECTOMY/DECOMPRESSION MICRODISCECTOMY N/A 08/18/2012   Procedure: LUMBAR LAMINECTOMY/DECOMPRESSION MICRODISCECTOMY 3 LEVELS;  Surgeon: Hosie Spangle, MD;  Location: [facility] [place] [facility];  Service: Neurosurgery;  Laterality: N/A;  Lumbar Two through Five Laminectomy  . REVISON OF ARTERIOVENOUS FISTULA Left 01/06/2017   Procedure: REVISON OF ARTERIOVENOUS FISTULA LEFT ARM;  Surgeon: Serafina Mitchell, MD;  Location: [facility];  Service: Vascular;  Laterality: Left;  . SHOULDER OPEN ROTATOR CUFF REPAIR  Right 2001  . THROMBECTOMY AND REVISION OF ARTERIOVENTOUS (AV) GORETEX  GRAFT Left 01/17/2017   Procedure: REVISION OF ARTERIOVENTOUS (AV) FISTULA LEFT ARM;  Surgeon: Waynetta Sandy, MD;  Location: [facility];  Service: Vascular;  Laterality: Left;  . TOTAL HIP ARTHROPLASTY Right 07/26/2013   Procedure: TOTAL HIP ARTHROPLASTY;  Surgeon: Kerin Salen, MD;  Location: [place];  Service: Orthopedics;  Laterality: Right;  . TOTAL KNEE ARTHROPLASTY Bilateral 2008-2010   right-left  . WRIST TENDON TRANSFER Bilateral 1972-1973   left-right     A IV Location/Drains/Wounds Patient Lines/Drains/Airways Status   Active Line/Drains/Airways    Name:   Placement date:   Placement time:   Site:   Days:   Peripheral IV 01/21/17 Right;Anterior Forearm   01/21/17    1550    Forearm   760   Peripheral IV 02/19/19 Right;Medial Antecubital   02/19/19    1845    Antecubital   1   Peripheral IV 02/19/19 Right;Lateral Antecubital   02/19/19    1900    Antecubital   1   Fistula / Graft Left Upper arm Arteriovenous fistula   08/04/16    1638    Upper arm   930   Fistula / Graft Left Upper arm Arteriovenous fistula   09/24/16    --    Upper arm   879   Sheath 12/24/16 Left   12/24/16    1512    --   788   Incision (Closed) 08/04/16 Chest Right   08/04/16    1518     930   Incision (Closed) 08/04/16 Neck Right   08/04/16    1518     930   Incision (Closed) 08/04/16 Arm Left   08/04/16    1634     930   Incision (Closed) 09/24/16 Arm Left   09/24/16    1057     879   Incision (Closed) 01/06/17 Arm Left   01/06/17    1902     775   Incision (Closed) 01/17/17 Arm Left   01/17/17    1021     764   Incision (Closed) 01/21/17 Arm Left   01/21/17    2007     760   Pressure Injury 08/03/16 Stage I -  Intact skin with non-blanchable redness of a localized area usually over a bony prominence.    08/03/16    2200     931          Intake/Output Last 24 hours  Intake/Output Summary (Last 24 hours) at 02/20/2019  2136 Last data filed at 02/20/2019 1307 Gross per 24 hour  Intake 150 ml  Output --  Net 150 ml    Labs/Imaging Results for orders placed or performed during the hospital encounter of 02/19/19 (from the past 48 hour(s))  Lactic acid, plasma     Status: Abnormal   Collection Time: 02/19/19  6:35 PM  Result Value Ref Range   Lactic Acid, Venous 2.2 (HH) 0.5 - 1.9 mmol/L  Comment: CRITICAL RESULT CALLED TO, READ BACK BY AND VERIFIED WITH: Denyse Dago RN AT 0017 02/19/19 BY Karie Chimera Performed at [facility], [place] [address] [place], [place] [address]   Protime-INR     Status: None   Collection Time: 02/19/19  6:35 PM  Result Value Ref Range   Prothrombin Time 13.5 11.4 - 15.2 seconds   INR 1.0 0.8 - 1.2    Comment: (NOTE) INR goal varies based on device and disease states. Performed at [facility], [place] [address], [place], [place] [address]   APTT     Status: Abnormal   Collection Time: 02/19/19  6:35 PM  Result Value Ref Range   aPTT 42 (H) 24 - 36 seconds    Comment:        IF BASELINE aPTT IS ELEVATED, SUGGEST PATIENT RISK ASSESSMENT BE USED TO DETERMINE APPROPRIATE ANTICOAGULANT THERAPY. Performed at [facility], [place] [address] [place] [address] [place], [place] [address]   CBC     Status: Abnormal   Collection Time: 02/19/19  6:35 PM  Result Value Ref Range   WBC 4.8 4.0 - 10.5 K/uL   RBC 3.58 (L) 4.22 - 5.81 MIL/uL   Hemoglobin 11.5 (L) 13.0 - 17.0 g/dL   HCT 35.5 (L) 39.0 - 52.0 %   MCV 99.2 80.0 - 100.0 fL   MCH 32.1 26.0 - 34.0 pg   MCHC 32.4 30.0 - 36.0 g/dL   RDW 13.1 11.5 - 15.5 %   Platelets 170 150 - 400 K/uL   nRBC 0.0 0.0 - 0.2 %    Comment: Performed at [facility], [place] [address], [place], [place] [address]  Differential     Status: None   Collection Time: 02/19/19  6:35 PM  Result Value Ref Range   Neutrophils Relative % 76 %   Neutro Abs 3.7 1.7 - 7.7 K/uL   Lymphocytes Relative 17 %   Lymphs Abs 0.8 0.7 - 4.0 K/uL   Monocytes  Relative 7 %   Monocytes Absolute 0.3 0.1 - 1.0 K/uL   Eosinophils Relative 0 %   Eosinophils Absolute 0.0 0.0 - 0.5 K/uL   Basophils Relative 0 %   Basophils Absolute 0.0 0.0 - 0.1 K/uL   Immature Granulocytes 0 %   Abs Immature Granulocytes 0.02 0.00 - 0.07 K/uL    Comment: Performed at [facility], [place] [address], [place], [place] [address]  Comprehensive metabolic panel     Status: Abnormal   Collection Time: 02/19/19  6:35 PM  Result Value Ref Range   Sodium 133 (L) 135 - 145 mmol/L   Potassium 4.9 3.5 - 5.1 mmol/L   Chloride 91 (L) 98 - 111 mmol/L   CO2 24 22 - 32 mmol/L   Glucose, Bld 129 (H) 70 - 99 mg/dL   BUN 30 (H) 8 - 23 mg/dL   Creatinine, Ser 8.70 (H) 0.61 - 1.24 mg/dL   Calcium 9.1 8.9 - 10.3 mg/dL   Total Protein 8.2 (H) 6.5 - 8.1 g/dL   Albumin 3.3 (L) 3.5 - 5.0 g/dL   AST 44 (H) 15 - 41 U/L   ALT 47 (H) 0 - 44 U/L   Alkaline Phosphatase 64 38 - 126 U/L   Total Bilirubin 0.8 0.3 - 1.2 mg/dL   GFR calc non Af Amer 6 (L) >60 mL/min   GFR calc Af Amer 6 (L) >60 mL/min   Anion gap 18 (H) 5 - 15  Comment: Performed at [facility], [place] [address] [place], [place] [address]  C-reactive protein     Status: Abnormal   Collection Time: 02/19/19  6:35 PM  Result Value Ref Range   CRP 7.5 (H) <1.0 mg/dL    Comment: Performed at [facility] [address] [place], [place] [address]  D-dimer, quantitative (not at [facility])     Status: Abnormal   Collection Time: 02/19/19  6:35 PM  Result Value Ref Range   D-Dimer, Quant 1.79 (H) 0.00 - 0.50 ug/mL-FEU    Comment: (NOTE) At the manufacturer cut-off of 0.50 ug/mL FEU, this assay has been documented to exclude PE with a sensitivity and negative predictive value of 97 to 99%.  At this time, this assay has not been approved by the FDA to exclude DVT/VTE. Results should be correlated with clinical presentation. Performed at [facility], [place] [address], [place], [place] [address]   Ferritin      Status: Abnormal   Collection Time: 02/19/19  6:35 PM  Result Value Ref Range   Ferritin 3,199 (H) 24 - 336 ng/mL    Comment: Performed at [facility], [place] [address] [place], [place] [address]  Fibrinogen     Status: Abnormal   Collection Time: 02/19/19  6:35 PM  Result Value Ref Range   Fibrinogen 672 (H) 210 - 475 mg/dL    Comment: Performed at [facility] [address] [place] [address] [place], [place] [address]  Lactate dehydrogenase     Status: Abnormal   Collection Time: 02/19/19  6:35 PM  Result Value Ref Range   LDH 219 (H) 98 - 192 U/L    Comment: Performed at [facility], [place] [address], [place],  [address]  Procalcitonin     Status: None   Collection Time: 02/19/19  6:35 PM  Result Value Ref Range   Procalcitonin 0.65 ng/mL    Comment:        Interpretation: PCT > 0.5 ng/mL and <= 2 ng/mL: Systemic infection (sepsis) is possible, but other conditions are known to elevate PCT as well. (NOTE)       Sepsis PCT Algorithm           Lower Respiratory Tract                                      Infection PCT Algorithm    ----------------------------     ----------------------------         PCT < 0.25 ng/mL                PCT < 0.10 ng/mL         Strongly encourage             Strongly discourage   discontinuation of antibiotics    initiation of antibiotics    ----------------------------     -----------------------------       PCT 0.25 - 0.50 ng/mL            PCT 0.10 - 0.25 ng/mL               OR       >80% decrease in PCT            Discourage initiation of  antibiotics      Encourage discontinuation           of antibiotics    ----------------------------     -----------------------------         PCT >= 0.50 ng/mL              PCT 0.26 - 0.50 ng/mL                AND       <80% decrease in PCT             Encourage initiation of                                             antibiotics       Encourage  continuation           of antibiotics    ----------------------------     -----------------------------        PCT >= 0.50 ng/mL                  PCT > 0.50 ng/mL               AND         increase in PCT                  Strongly encourage                                      initiation of antibiotics    Strongly encourage escalation           of antibiotics                                     -----------------------------                                           PCT <= 0.25 ng/mL                                                 OR                                        > 80% decrease in PCT                                     Discontinue / Do not initiate                                             antibiotics Performed at [facility], [place] [address], [place], [place] [address]   Triglycerides     Status: None   Collection Time: 02/19/19  6:35 PM  Result Value Ref Range   Triglycerides 135 <150 mg/dL    Comment: Performed at [facility] [address] [place], [place] [address]  CBG monitoring, ED     Status: Abnormal   Collection Time: 02/19/19  6:50 PM  Result Value Ref Range   Glucose-Capillary 106 (H) 70 - 99 mg/dL  Lactic acid, plasma     Status: None   Collection Time: 02/19/19  8:33 PM  Result Value Ref Range   Lactic Acid, Venous 1.2 0.5 - 1.9 mmol/L    Comment: Performed at [facility] [address] [place], [place] [address]  Blood Culture (routine x 2)     Status: None (Preliminary result)   Collection Time: 02/19/19  8:33 PM   Specimen: BLOOD RIGHT ARM  Result Value Ref Range   Specimen Description BLOOD RIGHT ARM    Special Requests      BOTTLES DRAWN AEROBIC AND ANAEROBIC Blood Culture adequate volume   Culture      NO GROWTH < 24 HOURS Performed at [facility], [place] [address] [place], [place] [address]    Report Status PENDING   Ethanol     Status: None   Collection Time: 02/19/19  8:33 PM  Result Value Ref Range    Alcohol, Ethyl (B) <10 <10 mg/dL    Comment: (NOTE) Lowest detectable limit for serum alcohol is 10 mg/dL. For medical purposes only. Performed at [facility], [place] [address] [place], [place] [address]   POC SARS Coronavirus 2 Ag-ED - Nasal Swab (BD Veritor Kit)     Status: Abnormal   Collection Time: 02/19/19  8:36 PM  Result Value Ref Range   SARS Coronavirus 2 Ag POSITIVE (A) NEGATIVE    Comment: (NOTE) SARS-CoV-2 antigen PRESENT. Positive results indicate the presence of viral antigens, but clinical correlation with patient history and other diagnostic information is necessary to determine patient infection status.  Positive results do not rule out bacterial infection or co-infection  with other viruses. False positive results are rare but can occur, and confirmatory RT-PCR testing may be appropriate in some circumstances. The expected result is Negative. Fact Sheet for Patients: PodPark.tn Fact Sheet for Providers: GiftContent.is  This test is not yet approved or cleared by the [place] FDA and  has been authorized for detection and/or diagnosis of SARS-CoV-2 by FDA under an Emergency Use Authorization (EUA).  This EUA will remain in effect (meaning this test can be used) for the duration of  the COVID-19 declaration under Section 564(b)(1) of the Act, 21 U.S.C. section 360bbb-3(b)(1), unless the a uthorization is terminated or revoked sooner.   I-stat chem 8, ED     Status: Abnormal   Collection Time: 02/19/19  8:43 PM  Result Value Ref Range   Sodium 129 (L) 135 - 145 mmol/L   Potassium 4.7 3.5 - 5.1 mmol/L   Chloride 92 (L) 98 - 111 mmol/L   BUN 33 (H) 8 - 23 mg/dL   Creatinine, Ser 9.70 (H) 0.61 - 1.24 mg/dL   Glucose, Bld 98 70 - 99 mg/dL   Calcium, Ion 0.93 (L) 1.15 - 1.40 mmol/L   TCO2 28 22 - 32 mmol/L   Hemoglobin 11.9 (L) 13.0 - 17.0 g/dL   HCT 35.0 (L) 39.0 - 52.0 %  TSH     Status: None    Collection Time: 02/19/19 10:06 PM  Result Value Ref Range   TSH 2.069  0.350 - 4.500 uIU/mL    Comment: Performed by a 3rd Generation assay with a functional sensitivity of <=0.01 uIU/mL. Performed at [facility], [place] [address] [place], [place] [address]   Vitamin B12     Status: Abnormal   Collection Time: 02/19/19 10:06 PM  Result Value Ref Range   Vitamin B-12 1,170 (H) 180 - 914 pg/mL    Comment: (NOTE) This assay is not validated for testing neonatal or myeloproliferative syndrome specimens for Vitamin B12 levels. Performed at [facility], [place] [address], [place], [place] [address]   HIV Antibody (routine testing w rflx)     Status: None   Collection Time: 02/19/19 10:06 PM  Result Value Ref Range   HIV Screen 4th Generation wRfx NON REACTIVE NON REACTIVE    Comment: Performed at [facility], [place] [address], [place], [place] [address]  Procalcitonin     Status: None   Collection Time: 02/20/19  6:20 AM  Result Value Ref Range   Procalcitonin 0.83 ng/mL    Comment:        Interpretation: PCT > 0.5 ng/mL and <= 2 ng/mL: Systemic infection (sepsis) is possible, but other conditions are known to elevate PCT as well. (NOTE)       Sepsis PCT Algorithm           Lower Respiratory Tract                                      Infection PCT Algorithm    ----------------------------     ----------------------------         PCT < 0.25 ng/mL                PCT < 0.10 ng/mL         Strongly encourage             Strongly discourage   discontinuation of antibiotics    initiation of antibiotics    ----------------------------     -----------------------------       PCT 0.25 - 0.50 ng/mL            PCT 0.10 - 0.25 ng/mL               OR       >80% decrease in PCT            Discourage initiation of                                            antibiotics      Encourage discontinuation           of antibiotics    ----------------------------      -----------------------------         PCT >= 0.50 ng/mL              PCT 0.26 - 0.50 ng/mL                AND       <80% decrease in PCT             Encourage initiation of  antibiotics       Encourage continuation           of antibiotics    ----------------------------     -----------------------------        PCT >= 0.50 ng/mL                  PCT > 0.50 ng/mL               AND         increase in PCT                  Strongly encourage                                      initiation of antibiotics    Strongly encourage escalation           of antibiotics                                     -----------------------------                                           PCT <= 0.25 ng/mL                                                 OR                                        > 80% decrease in PCT                                     Discontinue / Do not initiate                                             antibiotics Performed at [facility], [address] [place], [place] [address]   CBC with Differential/Platelet     Status: Abnormal   Collection Time: 02/20/19  6:20 AM  Result Value Ref Range   WBC 4.4 4.0 - 10.5 K/uL   RBC 3.48 (L) 4.22 - 5.81 MIL/uL   Hemoglobin 11.2 (L) 13.0 - 17.0 g/dL   HCT 35.0 (L) 39.0 - 52.0 %   MCV 100.6 (H) 80.0 - 100.0 fL   MCH 32.2 26.0 - 34.0 pg   MCHC 32.0 30.0 - 36.0 g/dL   RDW 13.2 11.5 - 15.5 %   Platelets 156 150 - 400 K/uL   nRBC 0.0 0.0 - 0.2 %   Neutrophils Relative % 81 %   Neutro Abs 3.5 1.7 - 7.7 K/uL   Lymphocytes Relative 15 %   Lymphs Abs 0.7 0.7 - 4.0 K/uL   Monocytes Relative 4 %   Monocytes Absolute 0.2 0.1 - 1.0 K/uL   Eosinophils Relative 0 %  Eosinophils Absolute 0.0 0.0 - 0.5 K/uL   Basophils Relative 0 %   Basophils Absolute 0.0 0.0 - 0.1 K/uL   Immature Granulocytes 0 %   Abs Immature Granulocytes 0.01 0.00 - 0.07 K/uL    Comment: Performed at [facility] [address] [place], [place] [address]  Comprehensive metabolic panel     Status: Abnormal   Collection Time: 02/20/19  6:20 AM  Result Value Ref Range   Sodium 131 (L) 135 - 145 mmol/L   Potassium 5.7 (H) 3.5 - 5.1 mmol/L    Comment: NO VISIBLE HEMOLYSIS   Chloride 89 (L) 98 - 111 mmol/L   CO2 25 22 - 32 mmol/L   Glucose, Bld 132 (H) 70 - 99 mg/dL   BUN 43 (H) 8 - 23 mg/dL   Creatinine, Ser 10.73 (H) 0.61 - 1.24 mg/dL   Calcium 9.0 8.9 - 10.3 mg/dL   Total Protein 7.4 6.5 - 8.1 g/dL   Albumin 3.0 (L) 3.5 - 5.0 g/dL   AST 43 (H) 15 - 41 U/L   ALT 43 0 - 44 U/L   Alkaline Phosphatase 58 38 - 126 U/L   Total Bilirubin 0.4 0.3 - 1.2 mg/dL   GFR calc non Af Amer 4 (L) >60 mL/min   GFR calc Af Amer 5 (L) >60 mL/min   Anion gap 17 (H) 5 - 15    Comment: Performed at [facility], [place] [address], [place], [name] [address]  C-reactive protein     Status: Abnormal   Collection Time: 02/20/19  6:20 AM  Result Value Ref Range   CRP 10.3 (H) <1.0 mg/dL    Comment: Performed at [facility] [address] [place], [place] [address]  D-dimer, quantitative (not at [facility])     Status: Abnormal   Collection Time: 02/20/19  6:20 AM  Result Value Ref Range   D-Dimer, Quant 2.10 (H) 0.00 - 0.50 ug/mL-FEU    Comment: (NOTE) At the manufacturer cut-off of 0.50 ug/mL FEU, this assay has been documented to exclude PE with a sensitivity and negative predictive value of 97 to 99%.  At this time, this assay has not been approved by the FDA to exclude DVT/VTE. Results should be correlated with clinical presentation. Performed at [facility], [place] [address], [place], [place] [address]   Ferritin     Status: Abnormal   Collection Time: 02/20/19  6:20 AM  Result Value Ref Range   Ferritin 3,086 (H) 24 - 336 ng/mL    Comment: Performed at [facility], [place] [address], [place], [place] [address]  Magnesium     Status: None   Collection Time: 02/20/19  6:20 AM  Result Value Ref  Range   Magnesium 2.0 1.7 - 2.4 mg/dL    Comment: Performed at [facility] [address] [place], [place] [address]  Phosphorus     Status: Abnormal   Collection Time: 02/20/19  6:20 AM  Result Value Ref Range   Phosphorus 7.5 (H) 2.5 - 4.6 mg/dL    Comment: Performed at [facility] [address], [place], [place] [address]   CT Code Stroke CTA Head W/WO contrast  Result Date: 02/19/2019 CLINICAL DATA:  Dialysis patient with acute mental status changes. EXAM: CT ANGIOGRAPHY HEAD AND NECK CT PERFUSION BRAIN TECHNIQUE: Multidetector CT imaging of the head and neck was performed using the standard protocol during bolus administration of  intravenous contrast. Multiplanar CT image reconstructions and MIPs were obtained to evaluate the vascular anatomy. Carotid stenosis measurements (when applicable) are obtained utilizing NASCET criteria, using the distal internal carotid diameter as the denominator. Multiphase CT imaging of the brain was performed following IV bolus contrast injection. Subsequent parametric perfusion maps were calculated using RAPID software. CONTRAST:  139m OMNIPAQUE IOHEXOL 350 MG/ML SOLN COMPARISON:  Head CT earlier same day. FINDINGS: CTA NECK FINDINGS Aortic arch: Aortic atherosclerosis. No aneurysm or dissection. Branching pattern is normal. Right carotid system: Common carotid artery widely patent to the bifurcation. Mild atherosclerotic plaque at the ICA bulb but no stenosis. Cervical ICA widely patent beyond that. Left carotid system: Common carotid artery widely patent to the bifurcation. Mild atherosclerotic plaque at the bifurcation but no stenosis. Cervical ICA widely patent beyond that. Vertebral arteries: Both vertebral artery origins show some adjacent plaque but no stenosis. Left vertebral artery is dominant. Both vertebral arteries widely patent through the cervical region to the foramen magnum. Skeleton: Ordinary cervical spondylosis. Other neck: No mass  or lymphadenopathy. Upper chest: Patchy pulmonary infiltrates consistent with pneumonia. Patient with recent coronavirus diagnosis. Review of the MIP images confirms the above findings CTA HEAD FINDINGS Anterior circulation: Both internal carotid arteries are patent through the skull base and siphon regions. Siphon atherosclerotic calcification but without stenosis greater than 30%. Anterior and middle cerebral vessels are patent without proximal stenosis, aneurysm or vascular malformation. No large or medium vessel occlusion. Posterior circulation: Both vertebral arteries are widely patent to the basilar. No basilar stenosis. Posterior circulation branch vessels are patent and unremarkable. Venous sinuses: Patent and normal. Anatomic variants: None significant. Review of the MIP images confirms the above findings CT Brain Perfusion Findings: ASPECTS: 10 CBF (<30%) Volume: 040mPerfusion (Tmax>6.0s) volume: 6170meported by the program. I think this is artifactual and related to patient motion. This is bilateral and symmetric affecting the inferior cortex. Mismatch Volume: 94m86mported, but in actuality I do not believe there is an acute insult. Infarction Location:None IMPRESSION: No large or medium vessel occlusion. Ordinary mild atherosclerosis of the carotid bifurcations and carotid siphons but without flow limiting stenosis. Perfusion imaging does not show an infarction. I do not believe there is any true at risk brain. There is a good deal of motion during this examination, explaining the T-max data, which I believe is artifactual. Case discussed by telephone with Dr. AuroMalen Gauze1932 hours. Electronically Signed   By: MarkNelson Chimes.   On: 02/19/2019 19:39   CT Code Stroke CTA Neck W/WO contrast  Result Date: 02/19/2019 CLINICAL DATA:  Dialysis patient with acute mental status changes. EXAM: CT ANGIOGRAPHY HEAD AND NECK CT PERFUSION BRAIN TECHNIQUE: Multidetector CT imaging of the head and neck was  performed using the standard protocol during bolus administration of intravenous contrast. Multiplanar CT image reconstructions and MIPs were obtained to evaluate the vascular anatomy. Carotid stenosis measurements (when applicable) are obtained utilizing NASCET criteria, using the distal internal carotid diameter as the denominator. Multiphase CT imaging of the brain was performed following IV bolus contrast injection. Subsequent parametric perfusion maps were calculated using RAPID software. CONTRAST:  125mL71mIPAQUE IOHEXOL 350 MG/ML SOLN COMPARISON:  Head CT earlier same day. FINDINGS: CTA NECK FINDINGS Aortic arch: Aortic atherosclerosis. No aneurysm or dissection. Branching pattern is normal. Right carotid system: Common carotid artery widely patent to the bifurcation. Mild atherosclerotic plaque at the ICA bulb but no stenosis. Cervical ICA widely patent beyond that. Left carotid system: Common carotid  artery widely patent to the bifurcation. Mild atherosclerotic plaque at the bifurcation but no stenosis. Cervical ICA widely patent beyond that. Vertebral arteries: Both vertebral artery origins show some adjacent plaque but no stenosis. Left vertebral artery is dominant. Both vertebral arteries widely patent through the cervical region to the foramen magnum. Skeleton: Ordinary cervical spondylosis. Other neck: No mass or lymphadenopathy. Upper chest: Patchy pulmonary infiltrates consistent with pneumonia. Patient with recent coronavirus diagnosis. Review of the MIP images confirms the above findings CTA HEAD FINDINGS Anterior circulation: Both internal carotid arteries are patent through the skull base and siphon regions. Siphon atherosclerotic calcification but without stenosis greater than 30%. Anterior and middle cerebral vessels are patent without proximal stenosis, aneurysm or vascular malformation. No large or medium vessel occlusion. Posterior circulation: Both vertebral arteries are widely patent to  the basilar. No basilar stenosis. Posterior circulation branch vessels are patent and unremarkable. Venous sinuses: Patent and normal. Anatomic variants: None significant. Review of the MIP images confirms the above findings CT Brain Perfusion Findings: ASPECTS: 10 CBF (<30%) Volume: 22m Perfusion (Tmax>6.0s) volume: 629mreported by the program. I think this is artifactual and related to patient motion. This is bilateral and symmetric affecting the inferior cortex. Mismatch Volume: 6156meported, but in actuality I do not believe there is an acute insult. Infarction Location:None IMPRESSION: No large or medium vessel occlusion. Ordinary mild atherosclerosis of the carotid bifurcations and carotid siphons but without flow limiting stenosis. Perfusion imaging does not show an infarction. I do not believe there is any true at risk brain. There is a good deal of motion during this examination, explaining the T-max data, which I believe is artifactual. Case discussed by telephone with Dr. AurMalen Gauze 1932 hours. Electronically Signed   By: MarNelson ChimesD.   On: 02/19/2019 19:39   CT Code Stroke Cerebral Perfusion with contrast  Result Date: 02/19/2019 CLINICAL DATA:  Dialysis patient with acute mental status changes. EXAM: CT ANGIOGRAPHY HEAD AND NECK CT PERFUSION BRAIN TECHNIQUE: Multidetector CT imaging of the head and neck was performed using the standard protocol during bolus administration of intravenous contrast. Multiplanar CT image reconstructions and MIPs were obtained to evaluate the vascular anatomy. Carotid stenosis measurements (when applicable) are obtained utilizing NASCET criteria, using the distal internal carotid diameter as the denominator. Multiphase CT imaging of the brain was performed following IV bolus contrast injection. Subsequent parametric perfusion maps were calculated using RAPID software. CONTRAST:  125m54mNIPAQUE IOHEXOL 350 MG/ML SOLN COMPARISON:  Head CT earlier same day. FINDINGS:  CTA NECK FINDINGS Aortic arch: Aortic atherosclerosis. No aneurysm or dissection. Branching pattern is normal. Right carotid system: Common carotid artery widely patent to the bifurcation. Mild atherosclerotic plaque at the ICA bulb but no stenosis. Cervical ICA widely patent beyond that. Left carotid system: Common carotid artery widely patent to the bifurcation. Mild atherosclerotic plaque at the bifurcation but no stenosis. Cervical ICA widely patent beyond that. Vertebral arteries: Both vertebral artery origins show some adjacent plaque but no stenosis. Left vertebral artery is dominant. Both vertebral arteries widely patent through the cervical region to the foramen magnum. Skeleton: Ordinary cervical spondylosis. Other neck: No mass or lymphadenopathy. Upper chest: Patchy pulmonary infiltrates consistent with pneumonia. Patient with recent coronavirus diagnosis. Review of the MIP images confirms the above findings CTA HEAD FINDINGS Anterior circulation: Both internal carotid arteries are patent through the skull base and siphon regions. Siphon atherosclerotic calcification but without stenosis greater than 30%. Anterior and middle cerebral vessels are patent without  proximal stenosis, aneurysm or vascular malformation. No large or medium vessel occlusion. Posterior circulation: Both vertebral arteries are widely patent to the basilar. No basilar stenosis. Posterior circulation branch vessels are patent and unremarkable. Venous sinuses: Patent and normal. Anatomic variants: None significant. Review of the MIP images confirms the above findings CT Brain Perfusion Findings: ASPECTS: 10 CBF (<30%) Volume: 12m Perfusion (Tmax>6.0s) volume: 663mreported by the program. I think this is artifactual and related to patient motion. This is bilateral and symmetric affecting the inferior cortex. Mismatch Volume: 6153meported, but in actuality I do not believe there is an acute insult. Infarction Location:None IMPRESSION:  No large or medium vessel occlusion. Ordinary mild atherosclerosis of the carotid bifurcations and carotid siphons but without flow limiting stenosis. Perfusion imaging does not show an infarction. I do not believe there is any true at risk brain. There is a good deal of motion during this examination, explaining the T-max data, which I believe is artifactual. Case discussed by telephone with Dr. AurMalen Gauze 1932 hours. Electronically Signed   By: MarNelson ChimesD.   On: 02/19/2019 19:39   DG Chest Port 1 View  Result Date: 02/19/2019 CLINICAL DATA:  Shortness of breath, dialysis, altered mental status EXAM: PORTABLE CHEST 1 VIEW COMPARISON:  08/04/2016 FINDINGS: Mild cardiomegaly. Both lungs are clear. The visualized skeletal structures are unremarkable. IMPRESSION: Mild cardiomegaly without acute abnormality of the lungs in AP portable projection. Electronically Signed   By: AleEddie CandleD.   On: 02/19/2019 20:33   CT HEAD CODE STROKE WO CONTRAST  Result Date: 02/19/2019 CLINICAL DATA:  Code stroke. Dialysis patient with expressive aphasia. EXAM: CT HEAD WITHOUT CONTRAST TECHNIQUE: Contiguous axial images were obtained from the base of the skull through the vertex without intravenous contrast. COMPARISON:  None. FINDINGS: Brain: The study suffers from motion degradation. Allowing for that, there is no evidence of old or acute infarction, mass lesion, hemorrhage, hydrocephalus or extra-axial collection. Vascular: There is atherosclerotic calcification of the major vessels at the base of the brain. Skull: Negative Sinuses/Orbits: Clear/normal Other: None ASPECTS ([place] Program Early CT Score) - Ganglionic level infarction (caudate, lentiform nuclei, internal capsule, insula, M1-M3 cortex): 7 - Supraganglionic infarction (M4-M6 cortex): 3 Total score (0-10 with 10 being normal): 10 IMPRESSION: 1. Motion degraded but otherwise normal examination. 2. ASPECTS is 10. 3. These results were communicated to  Dr. AroRory Percy 7:1[place]3/2021by text page via the [facility] system. Electronically Signed   By: MarNelson ChimesD.   On: 02/19/2019 19:12    Pending Labs Unresulted Labs (From admission, onward)    Start     Ordered   02/20/19 0500  CBC with Differential/Platelet  Daily,   R     02/19/19 2127   02/20/19 0500  Comprehensive metabolic panel  Daily,   R     02/19/19 2127   02/20/19 0500  C-reactive protein  Daily,   R     02/19/19 2127   02/20/19 0500  [facility],   R     02/19/19 2127   02/20/19 0500  Ferritin  Daily,   R     02/19/19 2127   02/20/19 0500  Procalcitonin  Daily,   R     02/19/19 2125   02/19/19 2126  Culture, sputum-assessment  Once,   R     02/19/19 2126   02/19/19 2107  Ammonia  Once,   STAT     02/19/19  2106   02/19/19 2107  RPR  Once,   STAT     02/19/19 2106   02/19/19 1836  Blood Culture (routine x 2)  BLOOD CULTURE X 2,   STAT     02/19/19 1836   Signed and Held  Renal function panel  Once,   R     Signed and Held   Signed and Held  CBC  Once,   R     Signed and Held          Vitals/Pain Today's Vitals   02/20/19 2030 02/20/19 2041 02/20/19 2100 02/20/19 2130  BP: (!) 129/50  (!) 124/46 (!) 132/56  Pulse: 79  74 75  Resp: (!) _0 Temp:      TempSrc:      SpO2: 99%  96% 96%  PainSc:  0-No pain      Isolation Precautions Airborne and Contact precautions  Medications Medications  heparin injection 5,000 Units (5,000 Units Subcutaneous Given 02/20/19 1757)  sodium chloride flush (NS) 0.9 % injection 3 mL (3 mLs Intravenous Given 02/20/19 0955)  remdesivir 200 mg in sodium chloride 0.9% 250 mL IVPB (0 mg Intravenous Stopped 02/20/19 0105)    Followed by  remdesivir 100 mg in sodium chloride 0.9 % 100 mL IVPB (0 mg Intravenous Stopped 02/20/19 1154)  dexamethasone (DECADRON) injection 6 mg (6 mg Intravenous Given 02/19/19 2222)  acetaminophen (TYLENOL) tablet 650 mg (has no administration in time range)   HYDROcodone-acetaminophen (NORCO/VICODIN) 5-325 MG per tablet 1 tablet (1 tablet Oral Given 02/20/19 0641)  ondansetron (ZOFRAN) tablet 4 mg ( Oral See Alternative 02/19/19 2230)    Or  ondansetron (ZOFRAN) injection 4 mg (4 mg Intravenous Given 02/19/19 2230)  cefTRIAXone (ROCEPHIN) 2 g in sodium chloride 0.9 % 100 mL IVPB (0 g Intravenous Stopped 02/19/19 2255)  azithromycin (ZITHROMAX) 500 mg in sodium chloride 0.9 % 250 mL IVPB (0 mg Intravenous Stopped 02/20/19 0105)  fentaNYL (SUBLIMAZE) injection 25 mcg (has no administration in time range)  LORazepam (ATIVAN) injection 1 mg (has no administration in time range)  sevelamer carbonate (RENVELA) tablet 2,400 mg (2,400 mg Oral Given 02/20/19 1801)  febuxostat (ULORIC) tablet 20 mg (20 mg Oral Given 02/20/19 1800)  multivitamin (RENA-VIT) tablet 1 tablet (1 tablet Oral Given 02/20/19 1808)  gabapentin (NEURONTIN) capsule 300 mg (300 mg Oral Given 02/20/19 1809)  Chlorhexidine Gluconate Cloth 2 % PADS 6 each (has no administration in time range)  iohexol (OMNIPAQUE) 350 MG/ML injection 125 mL (125 mLs Intravenous Contrast Given 02/19/19 1928)  acetaminophen (TYLENOL) suppository 650 mg (650 mg Rectal Given 02/19/19 1945)  sodium zirconium cyclosilicate (LOKELMA) packet 10 g (10 g Oral Given 02/20/19 0849)  insulin aspart (novoLOG) injection 4 Units (4 Units Subcutaneous Given 02/20/19 0849)  dextrose 50 % solution 50 mL (50 mLs Intravenous Given 02/20/19 0848)  calcium gluconate 1 g/ 50 mL sodium chloride IVPB (0 g Intravenous Stopped 02/20/19 1307)    Mobility walks Low fall risk   Focused Assessments Cardiac Assessment Handoff:    No results found for: CKTOTAL, CKMB, CKMBINDEX, TROPONINI Lab Results  Component Value Date   DDIMER 2.10 (H) 02/20/2019   Does the Patient currently have chest pain? No  , Neuro Assessment Handoff:  Swallow screen pass? Yes    NIH Stroke Scale ( + Modified Stroke Scale Criteria)  Interval: Initial Level of Consciousness  (1a.)   : Alert, keenly responsive LOC Questions (1b. )   +: Answers both  questions correctly LOC Commands (1c. )   + : Performs both tasks correctly Best Gaze (2. )  +: Normal Visual (3. )  +: No visual loss Facial Palsy (4. )    : Normal symmetrical movements Motor Arm, Left (5a. )   +: No drift Motor Arm, Right (5b. )   +: No drift Motor Leg, Left (6a. )   +: No drift Motor Leg, Right (6b. )   +: No drift Limb Ataxia (7. ): Present in two limbs(LUE, difficulty BLE) Sensory (8. )   +: Normal, no sensory loss Best Language (9. )   +: No aphasia Dysarthria (10. ): Normal Extinction/Inattention (11.)   +: No Abnormality Modified SS Total  +: 0 Complete NIHSS TOTAL: 4 Last date known well: 02/19/19 Last time known well: 1330 Neuro Assessment: Exceptions to WDL Neuro Checks:   Initial (02/19/19 2008)  Last Documented NIHSS Modified Score: 0 (02/20/19 0520) Has TPA been given? No If patient is a Neuro Trauma and patient is going to OR before floor call report to 4N Charge nurse: [phone] or [phone]  , Pulmonary Assessment Handoff:  Lung sounds: Bilateral Breath Sounds: Clear, Diminished L Breath Sounds: Clear, Diminished R Breath Sounds: Clear, Diminished O2 Device: Nasal Cannula O2 Flow Rate (L/min): 3 L/min      R Recommendations: See Admitting Provider Note  Report given to:   Additional Notes:

## 2019-02-20 NOTE — Progress Notes (Signed)
PROGRESS NOTE    Richard Bush  INO:[id number] DOB: 1950-03-16 DOA: 02/19/2019 PCP: Quentin Cornwall, MD   Brief Narrative: 69 year old with past medical history significant for ESRD on dialysis, gout, hypertension, neuropathy and recent COVID-19 diagnosis who presents to the emergency department referred from his dialysis center for evaluation of confusion speech difficulty, gait difficulty and possible left-sided weakness.  He has been having mild cough, patient drove himself to the dialysis center, seems to be normal on arrival and at around 1:30 PM when preparing to leave the dialysis center there was concern for aphasia of balance and confusion. On arrival to the ED patient was febrile 39 Celsius, oxygen sat 89 on room air, tachycardic, chest x-ray cardiomegaly with no acute findings.  CTA head and neck negative for large or medium vessel occlusion.  Lactic acid 2.2.  There was concern the patient may have left arm drift and code a stroke was activated.  Neurology evaluated the patient and recommended MRI of the brain.  Assessment & Plan:   Principal Problem:   Acute encephalopathy Active Problems:   Idiopathic chronic gout without tophus   ESRD (end stage renal disease) ([place])   COVID-19 virus infection   Acute respiratory failure with hypoxia (HCC)   1-Acute encephalopathy probably metabolic/toxic; Presents with dysarthria and left-sided weakness, altered mental status from hemodialysis. CT negative for acute finding CTA no large vessel occlusion.  Deficit has really been the ED. To proceed with MRI In the setting of COVID-19 viral illness.  2-Acute Hypoxic Respiratory Failure; New Oxygen requirement. Oxygen on RA 89. Currently on 3 L.  Started on dexamethasone and Remdesivir.  Pro-calcitonin 0.65 continue with ceftriaxone and azithro.   3-ESRD;  on hemodialysis: Hyperkalemia. Lokelma, Insulin, calcium gluconate ordered.  Nephrology consulted.  4-Gout; resume urolic   5-Transaminases;  Related to covid, follow trend.   6-Elevated D dimer; related to covid. Follow trend. On heparin for DVT prophylasix.    Chronic hypotension; resume midodrine with HD.            Estimated body mass index is 45.34 kg/m as calculated from the following:   Height as of 01/03/19: 5\' 10"  (1.778 m).   Weight as of 01/03/19: 143.3 kg.   DVT prophylaxis: Heparin  Code Status: full code Family Communication: care discussed with patient.  Disposition Plan: Patient with new hypoxic respiratory failure, new oxygen requirement secondary to COVID-19 pneumonia.  Hyperkalemia need to remain in the hospital for treatment of the same. Consultants:   none  Procedures:   None  Antimicrobials:  Ceftriaxone Azithromycin Remdesivir.   Subjective: Alert, speech clear, denies weakness.  Report dyspnea. Need medication to be able to do MRI  Objective: Vitals:   02/20/19 0430 02/20/19 0515 02/20/19 0600 02/20/19 0730  BP: 123/66 124/82 (!) 112/49 122/73  Pulse: 70 74 66 74  Resp:      Temp:      TempSrc:      SpO2: 93% (!) 82% 100% 98%   No intake or output data in the 24 hours ending 02/20/19 0813 There were no vitals filed for this visit.  Examination:  General exam: Appears calm and comfortable  Respiratory system: Clear to auscultation. Respiratory effort normal. Cardiovascular system: S1 & S2 heard, RRR. No JVD, murmurs, rubs, gallops or clicks. No pedal edema. Gastrointestinal system: Abdomen is nondistended, soft and nontender. No organomegaly or masses felt. Normal bowel sounds heard. Central nervous system: Alert and oriented. No focal neurological deficits. Extremities: Symmetric 5 x 5  power. Skin: No rashes, lesions or ulcers Psychiatry: Judgement and insight appear normal. Mood & affect appropriate.     Data Reviewed: I have personally reviewed following labs and imaging studies  CBC: Recent Labs  Lab 02/19/19 1835 02/19/19 2043 02/20/19  0620  WBC 4.8  --  4.4  NEUTROABS 3.7  --  3.5  HGB 11.5* 11.9* 11.2*  HCT 35.5* 35.0* 35.0*  MCV 99.2  --  100.6*  PLT 170  --  196   Basic Metabolic Panel: Recent Labs  Lab 02/19/19 1835 02/19/19 2043 02/20/19 0620  NA 133* 129* 131*  K 4.9 4.7 5.7*  CL 91* 92* 89*  CO2 24  --  25  GLUCOSE 129* 98 132*  BUN 30* 33* 43*  CREATININE 8.70* 9.70* 10.73*  CALCIUM 9.1  --  9.0  MG  --   --  2.0   GFR: CrCl cannot be calculated (Unknown ideal weight.). Liver Function Tests: Recent Labs  Lab 02/19/19 1835 02/20/19 0620  AST 44* 43*  ALT 47* 43  ALKPHOS 64 58  BILITOT 0.8 0.4  PROT 8.2* 7.4  ALBUMIN 3.3* 3.0*   No results for input(s): LIPASE, AMYLASE in the last 168 hours. No results for input(s): AMMONIA in the last 168 hours. Coagulation Profile: Recent Labs  Lab 02/19/19 1835  INR 1.0   Cardiac Enzymes: No results for input(s): CKTOTAL, CKMB, CKMBINDEX, TROPONINI in the last 168 hours. BNP (last 3 results) No results for input(s): PROBNP in the last 8760 hours. HbA1C: No results for input(s): HGBA1C in the last 72 hours. CBG: Recent Labs  Lab 02/19/19 1850  GLUCAP 106*   Lipid Profile: Recent Labs    02/19/19 1835  TRIG 135   Thyroid Function Tests: Recent Labs    02/19/19 2206  TSH 2.069   Anemia Panel: Recent Labs    02/19/19 1835 02/19/19 2206  VITAMINB12  --  1,170*  FERRITIN 3,199*  --    Sepsis Labs: Recent Labs  Lab 02/19/19 1835 02/19/19 2033  PROCALCITON 0.65  --   LATICACIDVEN 2.2* 1.2    No results found for this or any previous visit (from the past 240 hour(s)).       Radiology Studies: CT Code Stroke CTA Head W/WO contrast  Result Date: 02/19/2019 CLINICAL DATA:  Dialysis patient with acute mental status changes. EXAM: CT ANGIOGRAPHY HEAD AND NECK CT PERFUSION BRAIN TECHNIQUE: Multidetector CT imaging of the head and neck was performed using the standard protocol during bolus administration of intravenous  contrast. Multiplanar CT image reconstructions and MIPs were obtained to evaluate the vascular anatomy. Carotid stenosis measurements (when applicable) are obtained utilizing NASCET criteria, using the distal internal carotid diameter as the denominator. Multiphase CT imaging of the brain was performed following IV bolus contrast injection. Subsequent parametric perfusion maps were calculated using RAPID software. CONTRAST:  155mL OMNIPAQUE IOHEXOL 350 MG/ML SOLN COMPARISON:  Head CT earlier same day. FINDINGS: CTA NECK FINDINGS Aortic arch: Aortic atherosclerosis. No aneurysm or dissection. Branching pattern is normal. Right carotid system: Common carotid artery widely patent to the bifurcation. Mild atherosclerotic plaque at the ICA bulb but no stenosis. Cervical ICA widely patent beyond that. Left carotid system: Common carotid artery widely patent to the bifurcation. Mild atherosclerotic plaque at the bifurcation but no stenosis. Cervical ICA widely patent beyond that. Vertebral arteries: Both vertebral artery origins show some adjacent plaque but no stenosis. Left vertebral artery is dominant. Both vertebral arteries widely patent through the cervical region  to the foramen magnum. Skeleton: Ordinary cervical spondylosis. Other neck: No mass or lymphadenopathy. Upper chest: Patchy pulmonary infiltrates consistent with pneumonia. Patient with recent coronavirus diagnosis. Review of the MIP images confirms the above findings CTA HEAD FINDINGS Anterior circulation: Both internal carotid arteries are patent through the skull base and siphon regions. Siphon atherosclerotic calcification but without stenosis greater than 30%. Anterior and middle cerebral vessels are patent without proximal stenosis, aneurysm or vascular malformation. No large or medium vessel occlusion. Posterior circulation: Both vertebral arteries are widely patent to the basilar. No basilar stenosis. Posterior circulation branch vessels are patent  and unremarkable. Venous sinuses: Patent and normal. Anatomic variants: None significant. Review of the MIP images confirms the above findings CT Brain Perfusion Findings: ASPECTS: 10 CBF (<30%) Volume: 26mL Perfusion (Tmax>6.0s) volume: 12mL reported by the program. I think this is artifactual and related to patient motion. This is bilateral and symmetric affecting the inferior cortex. Mismatch Volume: 34mL reported, but in actuality I do not believe there is an acute insult. Infarction Location:None IMPRESSION: No large or medium vessel occlusion. Ordinary mild atherosclerosis of the carotid bifurcations and carotid siphons but without flow limiting stenosis. Perfusion imaging does not show an infarction. I do not believe there is any true at risk brain. There is a good deal of motion during this examination, explaining the T-max data, which I believe is artifactual. Case discussed by telephone with Dr. Malen Gauze at 1932 hours. Electronically Signed   By: Nelson Chimes M.D.   On: 02/19/2019 19:39   CT Code Stroke CTA Neck W/WO contrast  Result Date: 02/19/2019 CLINICAL DATA:  Dialysis patient with acute mental status changes. EXAM: CT ANGIOGRAPHY HEAD AND NECK CT PERFUSION BRAIN TECHNIQUE: Multidetector CT imaging of the head and neck was performed using the standard protocol during bolus administration of intravenous contrast. Multiplanar CT image reconstructions and MIPs were obtained to evaluate the vascular anatomy. Carotid stenosis measurements (when applicable) are obtained utilizing NASCET criteria, using the distal internal carotid diameter as the denominator. Multiphase CT imaging of the brain was performed following IV bolus contrast injection. Subsequent parametric perfusion maps were calculated using RAPID software. CONTRAST:  170mL OMNIPAQUE IOHEXOL 350 MG/ML SOLN COMPARISON:  Head CT earlier same day. FINDINGS: CTA NECK FINDINGS Aortic arch: Aortic atherosclerosis. No aneurysm or dissection. Branching  pattern is normal. Right carotid system: Common carotid artery widely patent to the bifurcation. Mild atherosclerotic plaque at the ICA bulb but no stenosis. Cervical ICA widely patent beyond that. Left carotid system: Common carotid artery widely patent to the bifurcation. Mild atherosclerotic plaque at the bifurcation but no stenosis. Cervical ICA widely patent beyond that. Vertebral arteries: Both vertebral artery origins show some adjacent plaque but no stenosis. Left vertebral artery is dominant. Both vertebral arteries widely patent through the cervical region to the foramen magnum. Skeleton: Ordinary cervical spondylosis. Other neck: No mass or lymphadenopathy. Upper chest: Patchy pulmonary infiltrates consistent with pneumonia. Patient with recent coronavirus diagnosis. Review of the MIP images confirms the above findings CTA HEAD FINDINGS Anterior circulation: Both internal carotid arteries are patent through the skull base and siphon regions. Siphon atherosclerotic calcification but without stenosis greater than 30%. Anterior and middle cerebral vessels are patent without proximal stenosis, aneurysm or vascular malformation. No large or medium vessel occlusion. Posterior circulation: Both vertebral arteries are widely patent to the basilar. No basilar stenosis. Posterior circulation branch vessels are patent and unremarkable. Venous sinuses: Patent and normal. Anatomic variants: None significant. Review of the MIP images  confirms the above findings CT Brain Perfusion Findings: ASPECTS: 10 CBF (<30%) Volume: 47mL Perfusion (Tmax>6.0s) volume: 51mL reported by the program. I think this is artifactual and related to patient motion. This is bilateral and symmetric affecting the inferior cortex. Mismatch Volume: 59mL reported, but in actuality I do not believe there is an acute insult. Infarction Location:None IMPRESSION: No large or medium vessel occlusion. Ordinary mild atherosclerosis of the carotid  bifurcations and carotid siphons but without flow limiting stenosis. Perfusion imaging does not show an infarction. I do not believe there is any true at risk brain. There is a good deal of motion during this examination, explaining the T-max data, which I believe is artifactual. Case discussed by telephone with Dr. Malen Gauze at 1932 hours. Electronically Signed   By: Nelson Chimes M.D.   On: 02/19/2019 19:39   CT Code Stroke Cerebral Perfusion with contrast  Result Date: 02/19/2019 CLINICAL DATA:  Dialysis patient with acute mental status changes. EXAM: CT ANGIOGRAPHY HEAD AND NECK CT PERFUSION BRAIN TECHNIQUE: Multidetector CT imaging of the head and neck was performed using the standard protocol during bolus administration of intravenous contrast. Multiplanar CT image reconstructions and MIPs were obtained to evaluate the vascular anatomy. Carotid stenosis measurements (when applicable) are obtained utilizing NASCET criteria, using the distal internal carotid diameter as the denominator. Multiphase CT imaging of the brain was performed following IV bolus contrast injection. Subsequent parametric perfusion maps were calculated using RAPID software. CONTRAST:  157mL OMNIPAQUE IOHEXOL 350 MG/ML SOLN COMPARISON:  Head CT earlier same day. FINDINGS: CTA NECK FINDINGS Aortic arch: Aortic atherosclerosis. No aneurysm or dissection. Branching pattern is normal. Right carotid system: Common carotid artery widely patent to the bifurcation. Mild atherosclerotic plaque at the ICA bulb but no stenosis. Cervical ICA widely patent beyond that. Left carotid system: Common carotid artery widely patent to the bifurcation. Mild atherosclerotic plaque at the bifurcation but no stenosis. Cervical ICA widely patent beyond that. Vertebral arteries: Both vertebral artery origins show some adjacent plaque but no stenosis. Left vertebral artery is dominant. Both vertebral arteries widely patent through the cervical region to the foramen  magnum. Skeleton: Ordinary cervical spondylosis. Other neck: No mass or lymphadenopathy. Upper chest: Patchy pulmonary infiltrates consistent with pneumonia. Patient with recent coronavirus diagnosis. Review of the MIP images confirms the above findings CTA HEAD FINDINGS Anterior circulation: Both internal carotid arteries are patent through the skull base and siphon regions. Siphon atherosclerotic calcification but without stenosis greater than 30%. Anterior and middle cerebral vessels are patent without proximal stenosis, aneurysm or vascular malformation. No large or medium vessel occlusion. Posterior circulation: Both vertebral arteries are widely patent to the basilar. No basilar stenosis. Posterior circulation branch vessels are patent and unremarkable. Venous sinuses: Patent and normal. Anatomic variants: None significant. Review of the MIP images confirms the above findings CT Brain Perfusion Findings: ASPECTS: 10 CBF (<30%) Volume: 22mL Perfusion (Tmax>6.0s) volume: 25mL reported by the program. I think this is artifactual and related to patient motion. This is bilateral and symmetric affecting the inferior cortex. Mismatch Volume: 51mL reported, but in actuality I do not believe there is an acute insult. Infarction Location:None IMPRESSION: No large or medium vessel occlusion. Ordinary mild atherosclerosis of the carotid bifurcations and carotid siphons but without flow limiting stenosis. Perfusion imaging does not show an infarction. I do not believe there is any true at risk brain. There is a good deal of motion during this examination, explaining the T-max data, which I believe is artifactual. Case  discussed by telephone with Dr. Malen Gauze at 1932 hours. Electronically Signed   By: Nelson Chimes M.D.   On: 02/19/2019 19:39   DG Chest Port 1 View  Result Date: 02/19/2019 CLINICAL DATA:  Shortness of breath, dialysis, altered mental status EXAM: PORTABLE CHEST 1 VIEW COMPARISON:  08/04/2016 FINDINGS: Mild  cardiomegaly. Both lungs are clear. The visualized skeletal structures are unremarkable. IMPRESSION: Mild cardiomegaly without acute abnormality of the lungs in AP portable projection. Electronically Signed   By: Hakeen Candle M.D.   On: 02/19/2019 20:33   CT HEAD CODE STROKE WO CONTRAST  Result Date: 02/19/2019 CLINICAL DATA:  Code stroke. Dialysis patient with expressive aphasia. EXAM: CT HEAD WITHOUT CONTRAST TECHNIQUE: Contiguous axial images were obtained from the base of the skull through the vertex without intravenous contrast. COMPARISON:  None. FINDINGS: Brain: The study suffers from motion degradation. Allowing for that, there is no evidence of old or acute infarction, mass lesion, hemorrhage, hydrocephalus or extra-axial collection. Vascular: There is atherosclerotic calcification of the major vessels at the base of the brain. Skull: Negative Sinuses/Orbits: Clear/normal Other: None ASPECTS ([place] Stroke Program Early CT Score) - Ganglionic level infarction (caudate, lentiform nuclei, internal capsule, insula, M1-M3 cortex): 7 - Supraganglionic infarction (M4-M6 cortex): 3 Total score (0-10 with 10 being normal): 10 IMPRESSION: 1. Motion degraded but otherwise normal examination. 2. ASPECTS is 10. 3. These results were communicated to Dr. Rory Percy at [place] 1/3/2021by text page via the [facility] messaging system. Electronically Signed   By: Nelson Chimes M.D.   On: 02/19/2019 19:12        Scheduled Meds: . dexamethasone (DECADRON) injection  6 mg Intravenous Q24H  . heparin  5,000 Units Subcutaneous Q8H  . sodium chloride flush  3 mL Intravenous Q12H   Continuous Infusions: . azithromycin Stopped (02/20/19 0105)  . cefTRIAXone (ROCEPHIN)  IV Stopped (02/19/19 2255)  . remdesivir 100 mg in NS 100 mL       LOS: 0 days    Time spent: 35 minutes    Jasmine Mcbeth A Adaysha Dubinsky, MD [facility]   If 7PM-7AM, please contact night-coverage [email or web address] Password [facility] 02/20/2019, 8:13 AM

## 2019-02-20 NOTE — ED Notes (Signed)
Pt's dinner tray arrived. Took tray into pt's room.

## 2019-02-20 NOTE — Consult Note (Addendum)
[facility] Renal Consultation Note    Indication for Consultation:  Management of ESRD/hemodialysis; anemia, hypertension/volume and secondary hyperparathyroidism  VQM:GQQPY, Florene Route, MD  HPI: Richard Bush is a 69 y.o. male. 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  Past medical history significant for HTN, Gout, neuropathy, pulmonary nodule discovered incidentally on CT 07/2016, and Hx R hip, B knee replacement.  Of note patient is compliant with prescribed dialysis regimen.  History obtained through chart review due to patient +COVID status.     He tested positive for COVID on 02/17/2019 and was transferred to [place] shift.  Last HD on 02/19/2019 due to holiday schedule where he left slightly under his dry weight.  He was sent to the hospital after completing his dialysis yesterday due to confusion, speech/gait difficultly and possible left sided weakness.  When examined in the ED by the admitting team patient denied complaints and was oriented to self, place, and day, month year.  Denied HA, lightheadedness, weakness, confusion, CP, hemoptysis and edema.   Pertinent findings since admission include K 5.7, ferritin 3.086, CRP 10.3, d-dimer 2.10, CT angio head with no acute findings and CXR with no evidence acute cardiopulmonary disease.  Patient has been admitted for further evaluation and treatment.    Past Medical History:  Diagnosis Date  . Anemia   . Chronic right hip pain   . ESRD (end stage renal disease) on dialysis [facility])    dialysis M/W/F- [facility] (01/06/2017)  . GERD (gastroesophageal reflux disease)   . Gout   . High cholesterol   . Hypertension   . Neuropathy   . Osteoarthritis    "all over" (01/06/2017)  . Pleurisy   . Pneumonia 1980s X 1   Past Surgical History:  Procedure Laterality Date  . A/V FISTULAGRAM Left 12/24/2016   Procedure: A/V FISTULAGRAM;  Surgeon: Waynetta Sandy, MD;  Location: [place] [facility];   Service: Cardiovascular;  Laterality: Left;  . APPENDECTOMY  1963  . AV FISTULA PLACEMENT Left 08/04/2016   Procedure: ARTERIOVENOUS (AV) FISTULA CREATION/LEFT ARM;  Surgeon: Serafina Mitchell, MD;  Location: [facility];  Service: Vascular;  Laterality: Left;  . AV FISTULA REPAIR Left 01/06/2017   arm  . BILATERAL CARPAL TUNNEL RELEASE Bilateral 2006-2007   right-left  . CHOLECYSTECTOMY OPEN  1979  . COLONOSCOPY    . FINGER SURGERY Right 2006   "thumb; took out bone that conects to wrist and a piece of tendon; weaved tendon back in for mobility"  . FISTULA SUPERFICIALIZATION Left 09/24/2016   Procedure: FISTULA SUPERFICIALIZATION- LEFT ARM;  Surgeon: Serafina Mitchell, MD;  Location: [place];  Service: Vascular;  Laterality: Left;  . HAMMER TOE SURGERY Left 1981  . INSERTION OF DIALYSIS CATHETER Left 08/04/2016   Procedure: INSERTION OF DIALYSIS CATHETER;  Surgeon: Serafina Mitchell, MD;  Location: [place];  Service: Vascular;  Laterality: Left;  . JOINT REPLACEMENT    . LIGATION OF ARTERIOVENOUS  FISTULA Left 01/21/2017   Procedure: REPAIR OF ARTERIOVENOUS  FISTULA;  Surgeon: Angelia Mould, MD;  Location: [place];  Service: Vascular;  Laterality: Left;  . LUMBAR LAMINECTOMY/DECOMPRESSION MICRODISCECTOMY N/A 08/18/2012   Procedure: LUMBAR LAMINECTOMY/DECOMPRESSION MICRODISCECTOMY 3 LEVELS;  Surgeon: Hosie Spangle, MD;  Location: [place] [facility];  Service: Neurosurgery;  Laterality: N/A;  Lumbar Two through Five Laminectomy  . REVISON OF ARTERIOVENOUS FISTULA Left 01/06/2017   Procedure: REVISON OF ARTERIOVENOUS FISTULA LEFT ARM;  Surgeon: Trula Slade,  Butch Penny, MD;  Location: [place];  Service: Vascular;  Laterality: Left;  . SHOULDER OPEN ROTATOR CUFF REPAIR Right 2001  . THROMBECTOMY AND REVISION OF ARTERIOVENTOUS (AV) GORETEX  GRAFT Left 01/17/2017   Procedure: REVISION OF ARTERIOVENTOUS (AV) FISTULA LEFT ARM;  Surgeon: Waynetta Sandy, MD;  Location: [place];  Service: Vascular;  Laterality:  Left;  . TOTAL HIP ARTHROPLASTY Right 07/26/2013   Procedure: TOTAL HIP ARTHROPLASTY;  Surgeon: Kerin Salen, MD;  Location: [place];  Service: Orthopedics;  Laterality: Right;  . TOTAL KNEE ARTHROPLASTY Bilateral 2008-2010   right-left  . WRIST TENDON TRANSFER Bilateral 1972-1973   left-right   Family History  Problem Relation Age of Onset  . Heart disease Mother   . Cancer Father    Social History:  reports that he quit smoking about 36 years ago. His smoking use included cigarettes. He has a 10.00 pack-year smoking history. He has quit using smokeless tobacco.  His smokeless tobacco use included chew. He reports previous alcohol use. He reports that he does not use drugs. Allergies  Allergen Reactions  . Cozaar [Losartan Potassium] Other (See Comments)    Stopped due to kidney decline 4/18  . Kayexalate [Polystyrene] Other (See Comments)    Abnormal ekg  . Altace [Ramipril] Cough    Chronic cough  . Other Swelling    "Symvisc"   . Cholestyramine Light Other (See Comments)    UNSPECIFIED REACTION d  . Sulfa Antibiotics Rash   Prior to Admission medications   Medication Sig Start Date End Date Taking? Authorizing Provider  acetaminophen (TYLENOL) 650 MG CR tablet Take 650 mg by mouth every 8 (eight) hours as needed for pain.   Yes [provider]  calcium acetate (PHOSLO) 667 MG capsule Take 1 capsule (667 mg total) by mouth 3 (three) times daily with meals. 08/08/16  Yes Ledell Noss, MD  cetirizine (ZYRTEC) 10 MG tablet Take 10 mg by mouth at bedtime.   Yes [provider]  docusate sodium (COLACE) 100 MG capsule Take 100 mg by mouth daily.   Yes [provider]  DULoxetine (CYMBALTA) 30 MG capsule Take 30 mg by mouth daily.  05/05/18  Yes [provider]  febuxostat (ULORIC) 40 MG tablet Take 1/2 (one-half) tablet by mouth once daily Patient taking differently: Take 20 mg by mouth daily.  11/03/18  Yes Deveshwar, Abel Presto, MD  folic acid-vitamin b  complex-vitamin c-selenium-zinc (DIALYVITE) 3 MG TABS tablet Take 1 tablet by mouth every evening.    Yes [provider]  gabapentin (NEURONTIN) 300 MG capsule Take 300 mg by mouth every evening.    Yes [provider]  ibuprofen (ADVIL,MOTRIN) 200 MG tablet Take 400 mg by mouth every 6 (six) hours as needed for mild pain.    Yes [provider]  midodrine (PROAMATINE) 10 MG tablet Take 10 mg by mouth every Monday, Wednesday, and Friday. 30 minutes before dialysis   Yes [provider]  omeprazole (PRILOSEC) 20 MG capsule Take 20 mg by mouth every morning.   Yes [provider]  sevelamer carbonate (RENVELA) 800 MG tablet Take 2,400 mg by mouth 3 (three) times daily with meals.  06/29/18  Yes [provider]  colchicine 0.6 MG tablet TAKE 1 TABLET BY MOUTH ONCE DAILY AS NEEDED FOR GOUT FLARE UPS Patient taking differently: Take 0.6 mg by mouth daily as needed (gout).  11/15/18   Bo Merino, MD   Current Facility-Administered Medications  Medication Dose Route  Frequency Provider Last Rate Last Admin  . acetaminophen (TYLENOL) tablet 650 mg  650 mg Oral Q6H PRN Opyd, Ilene Qua, MD      . azithromycin (ZITHROMAX) 500 mg in sodium chloride 0.9 % 250 mL IVPB  500 mg Intravenous Q24H Opyd, Ilene Qua, MD   Stopped at 02/20/19 0105  . calcium gluconate 1 g/ 50 mL sodium chloride IVPB  1 g Intravenous Once Regalado, Belkys A, MD      . cefTRIAXone (ROCEPHIN) 2 g in sodium chloride 0.9 % 100 mL IVPB  2 g Intravenous Q24H Opyd, Ilene Qua, MD   Stopped at 02/19/19 2255  . dexamethasone (DECADRON) injection 6 mg  6 mg Intravenous Q24H Opyd, Ilene Qua, MD   6 mg at 02/19/19 2222  . fentaNYL (SUBLIMAZE) injection 25 mcg  25 mcg Intravenous Q2H PRN Opyd, Ilene Qua, MD      . heparin injection 5,000 Units  5,000 Units Subcutaneous Q8H Opyd, Ilene Qua, MD   5,000 Units at 02/20/19 [phone]  . HYDROcodone-acetaminophen (NORCO/VICODIN) 5-325 MG per tablet 1  tablet  1 tablet Oral Q6H PRN Opyd, Ilene Qua, MD   1 tablet at 02/20/19 0641  . LORazepam (ATIVAN) injection 1 mg  1 mg Intravenous Once PRN Opyd, Ilene Qua, MD      . ondansetron (ZOFRAN) tablet 4 mg  4 mg Oral Q6H PRN Opyd, Ilene Qua, MD       Or  . ondansetron (ZOFRAN) injection 4 mg  4 mg Intravenous Q6H PRN Opyd, Ilene Qua, MD   4 mg at 02/19/19 2230  . remdesivir 100 mg in sodium chloride 0.9 % 100 mL IVPB  100 mg Intravenous Daily Opyd, Ilene Qua, MD 200 mL/hr at 02/20/19 0954 100 mg at 02/20/19 0954  . sodium chloride flush (NS) 0.9 % injection 3 mL  3 mL Intravenous Q12H Opyd, Ilene Qua, MD   3 mL at 02/20/19 2841   Current Outpatient Medications  Medication Sig Dispense Refill  . acetaminophen (TYLENOL) 650 MG CR tablet Take 650 mg by mouth every 8 (eight) hours as needed for pain.    . calcium acetate (PHOSLO) 667 MG capsule Take 1 capsule (667 mg total) by mouth 3 (three) times daily with meals. 90 capsule 0  . cetirizine (ZYRTEC) 10 MG tablet Take 10 mg by mouth at bedtime.    . docusate sodium (COLACE) 100 MG capsule Take 100 mg by mouth daily.    . DULoxetine (CYMBALTA) 30 MG capsule Take 30 mg by mouth daily.     . febuxostat (ULORIC) 40 MG tablet Take 1/2 (one-half) tablet by mouth once daily (Patient taking differently: Take 20 mg by mouth daily. ) 90 tablet 0  . folic acid-vitamin b complex-vitamin c-selenium-zinc (DIALYVITE) 3 MG TABS tablet Take 1 tablet by mouth every evening.     . gabapentin (NEURONTIN) 300 MG capsule Take 300 mg by mouth every evening.     Marland Kitchen ibuprofen (ADVIL,MOTRIN) 200 MG tablet Take 400 mg by mouth every 6 (six) hours as needed for mild pain.     . midodrine (PROAMATINE) 10 MG tablet Take 10 mg by mouth every Monday, Wednesday, and Friday. 30 minutes before dialysis    . omeprazole (PRILOSEC) 20 MG capsule Take 20 mg by mouth every morning.    . sevelamer carbonate (RENVELA) 800 MG tablet Take 2,400 mg by mouth 3 (three) times daily with meals.     .  colchicine 0.6 MG tablet TAKE 1 TABLET BY  MOUTH ONCE DAILY AS NEEDED FOR GOUT FLARE UPS (Patient taking differently: Take 0.6 mg by mouth daily as needed (gout). ) 90 tablet 0   Labs: Basic Metabolic Panel: Recent Labs  Lab 02/19/19 1835 02/19/19 2043 02/20/19 0620  NA 133* 129* 131*  K 4.9 4.7 5.7*  CL 91* 92* 89*  CO2 24  --  25  GLUCOSE 129* 98 132*  BUN 30* 33* 43*  CREATININE 8.70* 9.70* 10.73*  CALCIUM 9.1  --  9.0   Liver Function Tests: Recent Labs  Lab 02/19/19 1835 02/20/19 0620  AST 44* 43*  ALT 47* 43  ALKPHOS 64 58  BILITOT 0.8 0.4  PROT 8.2* 7.4  ALBUMIN 3.3* 3.0*   CBC: Recent Labs  Lab 02/19/19 1835 02/19/19 2043 02/20/19 0620  WBC 4.8  --  4.4  NEUTROABS 3.7  --  3.5  HGB 11.5* 11.9* 11.2*  HCT 35.5* 35.0* 35.0*  MCV 99.2  --  100.6*  PLT 170  --  156   CBG: Recent Labs  Lab 02/19/19 1850  GLUCAP 106*   Iron Studies:  Recent Labs    02/20/19 2505  FERRITIN 3,086*   Studies/Results: CT Code Stroke CTA Head W/WO contrast  Result Date: 02/19/2019 CLINICAL DATA:  Dialysis patient with acute mental status changes. EXAM: CT ANGIOGRAPHY HEAD AND NECK CT PERFUSION BRAIN TECHNIQUE: Multidetector CT imaging of the head and neck was performed using the standard protocol during bolus administration of intravenous contrast. Multiplanar CT image reconstructions and MIPs were obtained to evaluate the vascular anatomy. Carotid stenosis measurements (when applicable) are obtained utilizing NASCET criteria, using the distal internal carotid diameter as the denominator. Multiphase CT imaging of the brain was performed following IV bolus contrast injection. Subsequent parametric perfusion maps were calculated using RAPID software. CONTRAST:  172mL OMNIPAQUE IOHEXOL 350 MG/ML SOLN COMPARISON:  Head CT earlier same day. FINDINGS: CTA NECK FINDINGS Aortic arch: Aortic atherosclerosis. No aneurysm or dissection. Branching pattern is normal. Right carotid system:  Common carotid artery widely patent to the bifurcation. Mild atherosclerotic plaque at the ICA bulb but no stenosis. Cervical ICA widely patent beyond that. Left carotid system: Common carotid artery widely patent to the bifurcation. Mild atherosclerotic plaque at the bifurcation but no stenosis. Cervical ICA widely patent beyond that. Vertebral arteries: Both vertebral artery origins show some adjacent plaque but no stenosis. Left vertebral artery is dominant. Both vertebral arteries widely patent through the cervical region to the foramen magnum. Skeleton: Ordinary cervical spondylosis. Other neck: No mass or lymphadenopathy. Upper chest: Patchy pulmonary infiltrates consistent with pneumonia. Patient with recent coronavirus diagnosis. Review of the MIP images confirms the above findings CTA HEAD FINDINGS Anterior circulation: Both internal carotid arteries are patent through the skull base and siphon regions. Siphon atherosclerotic calcification but without stenosis greater than 30%. Anterior and middle cerebral vessels are patent without proximal stenosis, aneurysm or vascular malformation. No large or medium vessel occlusion. Posterior circulation: Both vertebral arteries are widely patent to the basilar. No basilar stenosis. Posterior circulation branch vessels are patent and unremarkable. Venous sinuses: Patent and normal. Anatomic variants: None significant. Review of the MIP images confirms the above findings CT Brain Perfusion Findings: ASPECTS: 10 CBF (<30%) Volume: 41mL Perfusion (Tmax>6.0s) volume: 58mL reported by the program. I think this is artifactual and related to patient motion. This is bilateral and symmetric affecting the inferior cortex. Mismatch Volume: 42mL reported, but in actuality I do not believe there is an acute insult. Infarction Location:None IMPRESSION: No  large or medium vessel occlusion. Ordinary mild atherosclerosis of the carotid bifurcations and carotid siphons but without flow  limiting stenosis. Perfusion imaging does not show an infarction. I do not believe there is any true at risk brain. There is a good deal of motion during this examination, explaining the T-max data, which I believe is artifactual. Case discussed by telephone with Dr. Malen Gauze at 1932 hours. Electronically Signed   By: Nelson Chimes M.D.   On: 02/19/2019 19:39   CT Code Stroke CTA Neck W/WO contrast  Result Date: 02/19/2019 CLINICAL DATA:  Dialysis patient with acute mental status changes. EXAM: CT ANGIOGRAPHY HEAD AND NECK CT PERFUSION BRAIN TECHNIQUE: Multidetector CT imaging of the head and neck was performed using the standard protocol during bolus administration of intravenous contrast. Multiplanar CT image reconstructions and MIPs were obtained to evaluate the vascular anatomy. Carotid stenosis measurements (when applicable) are obtained utilizing NASCET criteria, using the distal internal carotid diameter as the denominator. Multiphase CT imaging of the brain was performed following IV bolus contrast injection. Subsequent parametric perfusion maps were calculated using RAPID software. CONTRAST:  147mL OMNIPAQUE IOHEXOL 350 MG/ML SOLN COMPARISON:  Head CT earlier same day. FINDINGS: CTA NECK FINDINGS Aortic arch: Aortic atherosclerosis. No aneurysm or dissection. Branching pattern is normal. Right carotid system: Common carotid artery widely patent to the bifurcation. Mild atherosclerotic plaque at the ICA bulb but no stenosis. Cervical ICA widely patent beyond that. Left carotid system: Common carotid artery widely patent to the bifurcation. Mild atherosclerotic plaque at the bifurcation but no stenosis. Cervical ICA widely patent beyond that. Vertebral arteries: Both vertebral artery origins show some adjacent plaque but no stenosis. Left vertebral artery is dominant. Both vertebral arteries widely patent through the cervical region to the foramen magnum. Skeleton: Ordinary cervical spondylosis. Other neck:  No mass or lymphadenopathy. Upper chest: Patchy pulmonary infiltrates consistent with pneumonia. Patient with recent coronavirus diagnosis. Review of the MIP images confirms the above findings CTA HEAD FINDINGS Anterior circulation: Both internal carotid arteries are patent through the skull base and siphon regions. Siphon atherosclerotic calcification but without stenosis greater than 30%. Anterior and middle cerebral vessels are patent without proximal stenosis, aneurysm or vascular malformation. No large or medium vessel occlusion. Posterior circulation: Both vertebral arteries are widely patent to the basilar. No basilar stenosis. Posterior circulation branch vessels are patent and unremarkable. Venous sinuses: Patent and normal. Anatomic variants: None significant. Review of the MIP images confirms the above findings CT Brain Perfusion Findings: ASPECTS: 10 CBF (<30%) Volume: 19mL Perfusion (Tmax>6.0s) volume: 63mL reported by the program. I think this is artifactual and related to patient motion. This is bilateral and symmetric affecting the inferior cortex. Mismatch Volume: 58mL reported, but in actuality I do not believe there is an acute insult. Infarction Location:None IMPRESSION: No large or medium vessel occlusion. Ordinary mild atherosclerosis of the carotid bifurcations and carotid siphons but without flow limiting stenosis. Perfusion imaging does not show an infarction. I do not believe there is any true at risk brain. There is a good deal of motion during this examination, explaining the T-max data, which I believe is artifactual. Case discussed by telephone with Dr. Malen Gauze at 1932 hours. Electronically Signed   By: Nelson Chimes M.D.   On: 02/19/2019 19:39   CT Code Stroke Cerebral Perfusion with contrast  Result Date: 02/19/2019 CLINICAL DATA:  Dialysis patient with acute mental status changes. EXAM: CT ANGIOGRAPHY HEAD AND NECK CT PERFUSION BRAIN TECHNIQUE: Multidetector CT imaging of the  head  and neck was performed using the standard protocol during bolus administration of intravenous contrast. Multiplanar CT image reconstructions and MIPs were obtained to evaluate the vascular anatomy. Carotid stenosis measurements (when applicable) are obtained utilizing NASCET criteria, using the distal internal carotid diameter as the denominator. Multiphase CT imaging of the brain was performed following IV bolus contrast injection. Subsequent parametric perfusion maps were calculated using RAPID software. CONTRAST:  121mL OMNIPAQUE IOHEXOL 350 MG/ML SOLN COMPARISON:  Head CT earlier same day. FINDINGS: CTA NECK FINDINGS Aortic arch: Aortic atherosclerosis. No aneurysm or dissection. Branching pattern is normal. Right carotid system: Common carotid artery widely patent to the bifurcation. Mild atherosclerotic plaque at the ICA bulb but no stenosis. Cervical ICA widely patent beyond that. Left carotid system: Common carotid artery widely patent to the bifurcation. Mild atherosclerotic plaque at the bifurcation but no stenosis. Cervical ICA widely patent beyond that. Vertebral arteries: Both vertebral artery origins show some adjacent plaque but no stenosis. Left vertebral artery is dominant. Both vertebral arteries widely patent through the cervical region to the foramen magnum. Skeleton: Ordinary cervical spondylosis. Other neck: No mass or lymphadenopathy. Upper chest: Patchy pulmonary infiltrates consistent with pneumonia. Patient with recent coronavirus diagnosis. Review of the MIP images confirms the above findings CTA HEAD FINDINGS Anterior circulation: Both internal carotid arteries are patent through the skull base and siphon regions. Siphon atherosclerotic calcification but without stenosis greater than 30%. Anterior and middle cerebral vessels are patent without proximal stenosis, aneurysm or vascular malformation. No large or medium vessel occlusion. Posterior circulation: Both vertebral arteries are  widely patent to the basilar. No basilar stenosis. Posterior circulation branch vessels are patent and unremarkable. Venous sinuses: Patent and normal. Anatomic variants: None significant. Review of the MIP images confirms the above findings CT Brain Perfusion Findings: ASPECTS: 10 CBF (<30%) Volume: 27mL Perfusion (Tmax>6.0s) volume: 39mL reported by the program. I think this is artifactual and related to patient motion. This is bilateral and symmetric affecting the inferior cortex. Mismatch Volume: 85mL reported, but in actuality I do not believe there is an acute insult. Infarction Location:None IMPRESSION: No large or medium vessel occlusion. Ordinary mild atherosclerosis of the carotid bifurcations and carotid siphons but without flow limiting stenosis. Perfusion imaging does not show an infarction. I do not believe there is any true at risk brain. There is a good deal of motion during this examination, explaining the T-max data, which I believe is artifactual. Case discussed by telephone with Dr. Malen Gauze at 1932 hours. Electronically Signed   By: Nelson Chimes M.D.   On: 02/19/2019 19:39   DG Chest Port 1 View  Result Date: 02/19/2019 CLINICAL DATA:  Shortness of breath, dialysis, altered mental status EXAM: PORTABLE CHEST 1 VIEW COMPARISON:  08/04/2016 FINDINGS: Mild cardiomegaly. Both lungs are clear. The visualized skeletal structures are unremarkable. IMPRESSION: Mild cardiomegaly without acute abnormality of the lungs in AP portable projection. Electronically Signed   By: Prescott Candle M.D.   On: 02/19/2019 20:33   CT HEAD CODE STROKE WO CONTRAST  Result Date: 02/19/2019 CLINICAL DATA:  Code stroke. Dialysis patient with expressive aphasia. EXAM: CT HEAD WITHOUT CONTRAST TECHNIQUE: Contiguous axial images were obtained from the base of the skull through the vertex without intravenous contrast. COMPARISON:  None. FINDINGS: Brain: The study suffers from motion degradation. Allowing for that, there is no  evidence of old or acute infarction, mass lesion, hemorrhage, hydrocephalus or extra-axial collection. Vascular: There is atherosclerotic calcification of the major vessels at the base  of the brain. Skull: Negative Sinuses/Orbits: Clear/normal Other: None ASPECTS ([place] Stroke Program Early CT Score) - Ganglionic level infarction (caudate, lentiform nuclei, internal capsule, insula, M1-M3 cortex): 7 - Supraganglionic infarction (M4-M6 cortex): 3 Total score (0-10 with 10 being normal): 10 IMPRESSION: 1. Motion degraded but otherwise normal examination. 2. ASPECTS is 10. 3. These results were communicated to Dr. Rory Percy at [place] 1/3/2021by text page via the [facility] messaging system. Electronically Signed   By: Nelson Chimes M.D.   On: 02/19/2019 19:12    ROS: See HPI, unable to complete due to COVID + status.  In order to preserve PPE equipment and to minimize exposure to providers.  Notes from other caregivers reviewed   Physical Exam: Vitals:   02/20/19 0730 02/20/19 0830 02/20/19 0945 02/20/19 1015  BP: 122/73     Pulse: 74 64 73 73  Resp:  17 16 19   Temp:      TempSrc:      SpO2: 98% 95% 97% 97%     Physical Exam Unable to complete due to COVID + status.  In order to preserve PPE equipment and to minimize exposure to providers.  Notes from other caregivers reviewed  Dialysis Orders:  MWF - [facility]  4.25hrs, BFR 425, DFR 800,  EDW 141.5kg, 2K/ 2.25Ca  Access: LU AVF  Heparin 8000 unit bolus Mircera 30 mcg q4wks - last 01/25/19 Calcitriol 1.5 mcg PO qHD   Assessment/Plan:  1. Acute encephalopathy - CT angio head with no acute findings.  Neurology consulting.  MRI head ordered but unable to be completed today due to patient inability to remain still. Per neuro/primary 2. COVID 19+: on O2, decadron, remdesivir, rocephin and azithromycin.  Per primary 3. ESRD -  On HD TTS at [facility] d/t COVID + status.  Last HD yesterday, plan for HD tomorrow per regular schedule. Treatment  time will be shortened here due to high volume of patients.  If d/c can return to [facility] for dialysis tomorrow 2nd shift. K 5.7, give dose lokelma.  4.  Hypotension/volume  - chronic hypotension on midodrine. CXR with no evidence pulmonary edema. Under dry last HD, possibly losing weight. Titrate down as tolerated.     5.  Anemia of CKD - Hgb 11.2. No indication for ESA at this time.  Ferritin high, rechecking. No iron. 6.  Secondary Hyperparathyroidism -  Ca at goal. Will check phos.  Continue VDRA and binders.  7.  Nutrition - Renal diet w/fluid restrictions.  8. Gout 9. Elevated d-dimer - likely due to COVID 19. Per primary  Jen Mow, PA-C [facility] Pager: [phone] 02/20/2019, 11:11 AM   Nephrology attending: Chart reviewed and discussed with the primary team.  I agree with above.  The patient was not directly examined because of COVID-19 status in order to minimize exposure and to preserve PPE.  Patient with acute encephalopathy, initial CT scan negative.  Neurology consult pending.  Receiving treatment for COVID-19 pneumonia.  Plan for next HD tomorrow per TTS schedule.  Mild hyperkalemia noted for which a dose of Lokelma ordered.  Hemoglobin at goal.  Resume Renvela.  Lawson Radar, MD [facility].

## 2019-02-20 NOTE — ED Notes (Signed)
Breakfast ordered 

## 2019-02-21 LAB — CBC WITH DIFFERENTIAL/PLATELET
Abs Immature Granulocytes: 0.02 10*3/uL (ref 0.00–0.07)
Basophils Absolute: 0 10*3/uL (ref 0.0–0.1)
Basophils Relative: 0 %
Eosinophils Absolute: 0 10*3/uL (ref 0.0–0.5)
Eosinophils Relative: 0 %
HCT: 31.6 % — ABNORMAL LOW (ref 39.0–52.0)
Hemoglobin: 10.5 g/dL — ABNORMAL LOW (ref 13.0–17.0)
Immature Granulocytes: 0 %
Lymphocytes Relative: 9 %
Lymphs Abs: 0.5 10*3/uL — ABNORMAL LOW (ref 0.7–4.0)
MCH: 32.6 pg (ref 26.0–34.0)
MCHC: 33.2 g/dL (ref 30.0–36.0)
MCV: 98.1 fL (ref 80.0–100.0)
Monocytes Absolute: 0.4 10*3/uL (ref 0.1–1.0)
Monocytes Relative: 7 %
Neutro Abs: 4.8 10*3/uL (ref 1.7–7.7)
Neutrophils Relative %: 84 %
Platelets: 157 10*3/uL (ref 150–400)
RBC: 3.22 MIL/uL — ABNORMAL LOW (ref 4.22–5.81)
RDW: 13.2 % (ref 11.5–15.5)
WBC: 5.7 10*3/uL (ref 4.0–10.5)
nRBC: 0 % (ref 0.0–0.2)

## 2019-02-21 LAB — D-DIMER, QUANTITATIVE: D-Dimer, Quant: 2.52 ug/mL-FEU — ABNORMAL HIGH (ref 0.00–0.50)

## 2019-02-21 LAB — BASIC METABOLIC PANEL
Anion gap: 22 — ABNORMAL HIGH (ref 5–15)
BUN: 85 mg/dL — ABNORMAL HIGH (ref 8–23)
CO2: 24 mmol/L (ref 22–32)
Calcium: 9.2 mg/dL (ref 8.9–10.3)
Chloride: 89 mmol/L — ABNORMAL LOW (ref 98–111)
Creatinine, Ser: 14.64 mg/dL — ABNORMAL HIGH (ref 0.61–1.24)
GFR calc Af Amer: 3 mL/min — ABNORMAL LOW (ref >60)
GFR calc non Af Amer: 3 mL/min — ABNORMAL LOW (ref >60)
Glucose, Bld: 114 mg/dL — ABNORMAL HIGH (ref 70–99)
Potassium: 5.5 mmol/L — ABNORMAL HIGH (ref 3.5–5.1)
Sodium: 135 mmol/L (ref 135–145)

## 2019-02-21 LAB — COMPREHENSIVE METABOLIC PANEL
ALT: 37 U/L (ref 0–44)
AST: 42 U/L — ABNORMAL HIGH (ref 15–41)
Albumin: 2.8 g/dL — ABNORMAL LOW (ref 3.5–5.0)
Alkaline Phosphatase: 56 U/L (ref 38–126)
Anion gap: 19 — ABNORMAL HIGH (ref 5–15)
BUN: 67 mg/dL — ABNORMAL HIGH (ref 8–23)
CO2: 23 mmol/L (ref 22–32)
Calcium: 8.7 mg/dL — ABNORMAL LOW (ref 8.9–10.3)
Chloride: 90 mmol/L — ABNORMAL LOW (ref 98–111)
Creatinine, Ser: 13.37 mg/dL — ABNORMAL HIGH (ref 0.61–1.24)
GFR calc Af Amer: 4 mL/min — ABNORMAL LOW (ref >60)
GFR calc non Af Amer: 3 mL/min — ABNORMAL LOW (ref >60)
Glucose, Bld: 111 mg/dL — ABNORMAL HIGH (ref 70–99)
Potassium: 5.9 mmol/L — ABNORMAL HIGH (ref 3.5–5.1)
Sodium: 132 mmol/L — ABNORMAL LOW (ref 135–145)
Total Bilirubin: 0.5 mg/dL (ref 0.3–1.2)
Total Protein: 6.2 g/dL — ABNORMAL LOW (ref 6.5–8.1)

## 2019-02-21 LAB — CBC
HCT: 33.3 % — ABNORMAL LOW (ref 39.0–52.0)
Hemoglobin: 10.8 g/dL — ABNORMAL LOW (ref 13.0–17.0)
MCH: 32 pg (ref 26.0–34.0)
MCHC: 32.4 g/dL (ref 30.0–36.0)
MCV: 98.5 fL (ref 80.0–100.0)
Platelets: 187 10*3/uL (ref 150–400)
RBC: 3.38 MIL/uL — ABNORMAL LOW (ref 4.22–5.81)
RDW: 13.2 % (ref 11.5–15.5)
WBC: 6.1 10*3/uL (ref 4.0–10.5)
nRBC: 0 % (ref 0.0–0.2)

## 2019-02-21 LAB — RPR
RPR Ser Ql: REACTIVE — AB
RPR Titer: 1:1 {titer}

## 2019-02-21 LAB — PROCALCITONIN: Procalcitonin: 0.91 ng/mL

## 2019-02-21 LAB — FERRITIN: Ferritin: 3155 ng/mL — ABNORMAL HIGH (ref 24–336)

## 2019-02-21 LAB — C-REACTIVE PROTEIN: CRP: 9.9 mg/dL — ABNORMAL HIGH (ref <1.0)

## 2019-02-21 LAB — MAGNESIUM: Magnesium: 2.2 mg/dL (ref 1.7–2.4)

## 2019-02-21 MED ORDER — HEPARIN SODIUM (PORCINE) 1000 UNIT/ML DIALYSIS
8000.0000 [IU] | Freq: Once | INTRAMUSCULAR | Status: DC
  Filled 2019-02-21: qty 8

## 2019-02-21 MED ORDER — SODIUM CHLORIDE 0.9 % IV SOLN: 100.0000 mL | INTRAVENOUS | Status: DC | PRN

## 2019-02-21 MED ORDER — PENTAFLUOROPROP-TETRAFLUOROETH EX AERO: 1.0000 "application " | INHALATION_SPRAY | CUTANEOUS | Status: DC | PRN

## 2019-02-21 MED ORDER — LIDOCAINE-PRILOCAINE 2.5-2.5 % EX CREA: 1.0000 "application " | TOPICAL_CREAM | CUTANEOUS | Status: DC | PRN

## 2019-02-21 MED ORDER — HEPARIN SODIUM (PORCINE) 1000 UNIT/ML DIALYSIS: 1000.0000 [IU] | INTRAMUSCULAR | Status: DC | PRN

## 2019-02-21 MED ORDER — SODIUM ZIRCONIUM CYCLOSILICATE 10 G PO PACK
10.0000 g | PACK | Freq: Once | ORAL | Status: AC
  Administered 2019-02-21: 11:00:00 10 g via ORAL
  Filled 2019-02-21: qty 1

## 2019-02-21 MED ORDER — HEPARIN SODIUM (PORCINE) 5000 UNIT/ML IJ SOLN
7500.0000 [IU] | Freq: Three times a day (TID) | INTRAMUSCULAR | Status: DC
  Administered 2019-02-21 – 2019-02-24 (×8): 7500 [IU] via SUBCUTANEOUS
  Filled 2019-02-21 (×7): qty 2

## 2019-02-21 MED ORDER — LIDOCAINE HCL (PF) 1 % IJ SOLN
5.0000 mL | INTRAMUSCULAR | Status: DC | PRN
  Filled 2019-02-21: qty 5

## 2019-02-21 MED ORDER — ALTEPLASE 2 MG IJ SOLR: 2.0000 mg | Freq: Once | INTRAMUSCULAR | Status: DC | PRN

## 2019-02-21 MED ORDER — CALCIUM GLUCONATE-NACL 1-0.675 GM/50ML-% IV SOLN
1.0000 g | Freq: Once | INTRAVENOUS | Status: AC
  Administered 2019-02-21: 13:00:00 1000 mg via INTRAVENOUS
  Filled 2019-02-21: qty 50

## 2019-02-21 NOTE — Progress Notes (Deleted)
PROGRESS NOTE    Richard Bush  ZLD:[id number] DOB: 01-07-1951 DOA: 02/19/2019 PCP: Quentin Cornwall, MD   Brief Narrative: 69 year old with past medical history significant for ESRD on dialysis, gout, hypertension, neuropathy and recent COVID-19 diagnosis who presents to the emergency department referred from his dialysis center for evaluation of confusion speech difficulty, gait difficulty and possible left-sided weakness.  He has been having mild cough, patient drove himself to the dialysis center, seems to be normal on arrival and at around 1:30 PM when preparing to leave the dialysis center there was concern for aphasia of balance and confusion. On arrival to the ED patient was febrile 39 Celsius, oxygen sat 89 on room air, tachycardic, chest x-ray cardiomegaly with no acute findings.  CTA head and neck negative for large or medium vessel occlusion.  Lactic acid 2.2.  There was concern the patient may have left arm drift and code a stroke was activated.  Neurology evaluated the patient and recommended MRI of the brain.  Assessment & Plan:   Principal Problem:   Acute encephalopathy Active Problems:   Idiopathic chronic gout without tophus   ESRD (end stage renal disease) ([place])   COVID-19 virus infection   Acute respiratory failure with hypoxia (HCC)   Pneumonia due to COVID-19 virus   1-Acute encephalopathy probably metabolic/toxic; Presents with dysarthria and left-sided weakness, altered mental status from hemodialysis. CT negative for acute finding.  CTA no large vessel occlusion.  Deficit has resolved in  the ED. Awaiting MRI In the setting of COVID-19 viral illness. Neurology recommend MRI brain.   2-Acute Hypoxic Respiratory Failure; New Oxygen requirement. Oxygen on RA 89. Currently on 3 L.  Started on dexamethasone and Remdesivir. Day 3.  Pro-calcitonin 0.65.  continue with ceftriaxone and azithro.   3-ESRD;  on hemodialysis: Hyperkalemia. Lokelma, calcium gluconate ordered.   Nephrology consulted. Plan for HD today.   4-Gout; Continue with urolic.  5-Transaminases;  Related to covid, follow trend.   6-Elevated D dimer; related to covid. Follow trend. On heparin for DVT prophylasix.    Chronic hypotension; resume midodrine with HD.    Estimated body mass index is 45.34 kg/m as calculated from the following:   Height as of 01/03/19: 5\' 10"  (1.778 m).   Weight as of 01/03/19: 143.3 kg.   DVT prophylaxis: Heparin  Code Status: full code Family Communication: care discussed with patient.  Disposition Plan: Patient with new hypoxic respiratory failure, new oxygen requirement secondary to COVID-19 pneumonia.  Hyperkalemia need to remain in the hospital for treatment of the same. Consultants:   none  Procedures:   None  Antimicrobials:  Ceftriaxone Azithromycin Remdesivir.   Subjective: He is feeling well, he denies dyspnea.  Speech clear.   Objective: Vitals:   02/20/19 2130 02/20/19 2222 02/21/19 0028 02/21/19 0500  BP: (!) 132/56 (!) 137/59 133/72 114/62  Pulse: 75 91 86 66  Resp: 16 16 19    Temp:   100 F (37.8 C) 99.6 F (37.6 C)  TempSrc:   Oral Oral  SpO2: 96% 96% 96% 93%    Intake/Output Summary (Last 24 hours) at 02/21/2019 1540 Last data filed at 02/21/2019 1242 Gross per 24 hour  Intake 240 ml  Output --  Net 240 ml   There were no vitals filed for this visit.  Examination:  General exam: NAD Respiratory system: CTA Cardiovascular system: S 1, S 2 RRR Gastrointestinal system: BS present, soft, nt Central nervous system: Non focal.  Extremities: Symmetric power.  Skin: no  rashes   Data Reviewed: I have personally reviewed following labs and imaging studies  CBC: Recent Labs  Lab 02/19/19 1835 02/19/19 2043 02/20/19 0620 02/21/19 0326  WBC 4.8  --  4.4 5.7  NEUTROABS 3.7  --  3.5 4.8  HGB 11.5* 11.9* 11.2* 10.5*  HCT 35.5* 35.0* 35.0* 31.6*  MCV 99.2  --  100.6* 98.1  PLT 170  --  156 542   Basic  Metabolic Panel: Recent Labs  Lab 02/19/19 1835 02/19/19 2043 02/20/19 0620 02/21/19 0326  NA 133* 129* 131* 132*  K 4.9 4.7 5.7* 5.9*  CL 91* 92* 89* 90*  CO2 24  --  25 23  GLUCOSE 129* 98 132* 111*  BUN 30* 33* 43* 67*  CREATININE 8.70* 9.70* 10.73* 13.37*  CALCIUM 9.1  --  9.0 8.7*  MG  --   --  2.0  --   PHOS  --   --  7.5*  --    GFR: CrCl cannot be calculated (Unknown ideal weight.). Liver Function Tests: Recent Labs  Lab 02/19/19 1835 02/20/19 0620 02/21/19 0326  AST 44* 43* 42*  ALT 47* 43 37  ALKPHOS 64 58 56  BILITOT 0.8 0.4 0.5  PROT 8.2* 7.4 6.2*  ALBUMIN 3.3* 3.0* 2.8*   No results for input(s): LIPASE, AMYLASE in the last 168 hours. No results for input(s): AMMONIA in the last 168 hours. Coagulation Profile: Recent Labs  Lab 02/19/19 1835  INR 1.0   Cardiac Enzymes: No results for input(s): CKTOTAL, CKMB, CKMBINDEX, TROPONINI in the last 168 hours. BNP (last 3 results) No results for input(s): PROBNP in the last 8760 hours. HbA1C: No results for input(s): HGBA1C in the last 72 hours. CBG: Recent Labs  Lab 02/19/19 1850  GLUCAP 106*   Lipid Profile: Recent Labs    02/19/19 1835  TRIG 135   Thyroid Function Tests: Recent Labs    02/19/19 2206  TSH 2.069   Anemia Panel: Recent Labs    02/19/19 2206 02/20/19 0620 02/21/19 0326  VITAMINB12 1,170*  --   --   FERRITIN  --  3,086* 3,155*   Sepsis Labs: Recent Labs  Lab 02/19/19 1835 02/19/19 2033 02/20/19 0620 02/21/19 0326  PROCALCITON 0.65  --  0.83 0.91  LATICACIDVEN 2.2* 1.2  --   --     Recent Results (from the past 240 hour(s))  Blood Culture (routine x 2)     Status: None (Preliminary result)   Collection Time: 02/19/19  8:33 PM   Specimen: BLOOD RIGHT ARM  Result Value Ref Range Status   Specimen Description BLOOD RIGHT ARM  Final   Special Requests   Final    BOTTLES DRAWN AEROBIC AND ANAEROBIC Blood Culture adequate volume   Culture   Final    NO GROWTH 2  DAYS Performed at [facility], [place] [address], [place], [place] [address]    Report Status PENDING  Incomplete         Radiology Studies: CT Code Stroke CTA Head W/WO contrast  Result Date: 02/19/2019 CLINICAL DATA:  Dialysis patient with acute mental status changes. EXAM: CT ANGIOGRAPHY HEAD AND NECK CT PERFUSION BRAIN TECHNIQUE: Multidetector CT imaging of the head and neck was performed using the standard protocol during bolus administration of intravenous contrast. Multiplanar CT image reconstructions and MIPs were obtained to evaluate the vascular anatomy. Carotid stenosis measurements (when applicable) are obtained utilizing NASCET criteria, using the distal internal carotid diameter as the denominator. Multiphase  CT imaging of the brain was performed following IV bolus contrast injection. Subsequent parametric perfusion maps were calculated using RAPID software. CONTRAST:  16mL OMNIPAQUE IOHEXOL 350 MG/ML SOLN COMPARISON:  Head CT earlier same day. FINDINGS: CTA NECK FINDINGS Aortic arch: Aortic atherosclerosis. No aneurysm or dissection. Branching pattern is normal. Right carotid system: Common carotid artery widely patent to the bifurcation. Mild atherosclerotic plaque at the ICA bulb but no stenosis. Cervical ICA widely patent beyond that. Left carotid system: Common carotid artery widely patent to the bifurcation. Mild atherosclerotic plaque at the bifurcation but no stenosis. Cervical ICA widely patent beyond that. Vertebral arteries: Both vertebral artery origins show some adjacent plaque but no stenosis. Left vertebral artery is dominant. Both vertebral arteries widely patent through the cervical region to the foramen magnum. Skeleton: Ordinary cervical spondylosis. Other neck: No mass or lymphadenopathy. Upper chest: Patchy pulmonary infiltrates consistent with pneumonia. Patient with recent coronavirus diagnosis. Review of the MIP images confirms the above findings CTA HEAD  FINDINGS Anterior circulation: Both internal carotid arteries are patent through the skull base and siphon regions. Siphon atherosclerotic calcification but without stenosis greater than 30%. Anterior and middle cerebral vessels are patent without proximal stenosis, aneurysm or vascular malformation. No large or medium vessel occlusion. Posterior circulation: Both vertebral arteries are widely patent to the basilar. No basilar stenosis. Posterior circulation branch vessels are patent and unremarkable. Venous sinuses: Patent and normal. Anatomic variants: None significant. Review of the MIP images confirms the above findings CT Brain Perfusion Findings: ASPECTS: 10 CBF (<30%) Volume: 51mL Perfusion (Tmax>6.0s) volume: 39mL reported by the program. I think this is artifactual and related to patient motion. This is bilateral and symmetric affecting the inferior cortex. Mismatch Volume: 54mL reported, but in actuality I do not believe there is an acute insult. Infarction Location:None IMPRESSION: No large or medium vessel occlusion. Ordinary mild atherosclerosis of the carotid bifurcations and carotid siphons but without flow limiting stenosis. Perfusion imaging does not show an infarction. I do not believe there is any true at risk brain. There is a good deal of motion during this examination, explaining the T-max data, which I believe is artifactual. Case discussed by telephone with Dr. Malen Gauze at 1932 hours. Electronically Signed   By: Nelson Chimes M.D.   On: 02/19/2019 19:39   CT Code Stroke CTA Neck W/WO contrast  Result Date: 02/19/2019 CLINICAL DATA:  Dialysis patient with acute mental status changes. EXAM: CT ANGIOGRAPHY HEAD AND NECK CT PERFUSION BRAIN TECHNIQUE: Multidetector CT imaging of the head and neck was performed using the standard protocol during bolus administration of intravenous contrast. Multiplanar CT image reconstructions and MIPs were obtained to evaluate the vascular anatomy. Carotid stenosis  measurements (when applicable) are obtained utilizing NASCET criteria, using the distal internal carotid diameter as the denominator. Multiphase CT imaging of the brain was performed following IV bolus contrast injection. Subsequent parametric perfusion maps were calculated using RAPID software. CONTRAST:  123mL OMNIPAQUE IOHEXOL 350 MG/ML SOLN COMPARISON:  Head CT earlier same day. FINDINGS: CTA NECK FINDINGS Aortic arch: Aortic atherosclerosis. No aneurysm or dissection. Branching pattern is normal. Right carotid system: Common carotid artery widely patent to the bifurcation. Mild atherosclerotic plaque at the ICA bulb but no stenosis. Cervical ICA widely patent beyond that. Left carotid system: Common carotid artery widely patent to the bifurcation. Mild atherosclerotic plaque at the bifurcation but no stenosis. Cervical ICA widely patent beyond that. Vertebral arteries: Both vertebral artery origins show some adjacent plaque but no stenosis. Left  vertebral artery is dominant. Both vertebral arteries widely patent through the cervical region to the foramen magnum. Skeleton: Ordinary cervical spondylosis. Other neck: No mass or lymphadenopathy. Upper chest: Patchy pulmonary infiltrates consistent with pneumonia. Patient with recent coronavirus diagnosis. Review of the MIP images confirms the above findings CTA HEAD FINDINGS Anterior circulation: Both internal carotid arteries are patent through the skull base and siphon regions. Siphon atherosclerotic calcification but without stenosis greater than 30%. Anterior and middle cerebral vessels are patent without proximal stenosis, aneurysm or vascular malformation. No large or medium vessel occlusion. Posterior circulation: Both vertebral arteries are widely patent to the basilar. No basilar stenosis. Posterior circulation branch vessels are patent and unremarkable. Venous sinuses: Patent and normal. Anatomic variants: None significant. Review of the MIP images  confirms the above findings CT Brain Perfusion Findings: ASPECTS: 10 CBF (<30%) Volume: 85mL Perfusion (Tmax>6.0s) volume: 58mL reported by the program. I think this is artifactual and related to patient motion. This is bilateral and symmetric affecting the inferior cortex. Mismatch Volume: 61mL reported, but in actuality I do not believe there is an acute insult. Infarction Location:None IMPRESSION: No large or medium vessel occlusion. Ordinary mild atherosclerosis of the carotid bifurcations and carotid siphons but without flow limiting stenosis. Perfusion imaging does not show an infarction. I do not believe there is any true at risk brain. There is a good deal of motion during this examination, explaining the T-max data, which I believe is artifactual. Case discussed by telephone with Dr. Malen Gauze at 1932 hours. Electronically Signed   By: Nelson Chimes M.D.   On: 02/19/2019 19:39   CT Code Stroke Cerebral Perfusion with contrast  Result Date: 02/19/2019 CLINICAL DATA:  Dialysis patient with acute mental status changes. EXAM: CT ANGIOGRAPHY HEAD AND NECK CT PERFUSION BRAIN TECHNIQUE: Multidetector CT imaging of the head and neck was performed using the standard protocol during bolus administration of intravenous contrast. Multiplanar CT image reconstructions and MIPs were obtained to evaluate the vascular anatomy. Carotid stenosis measurements (when applicable) are obtained utilizing NASCET criteria, using the distal internal carotid diameter as the denominator. Multiphase CT imaging of the brain was performed following IV bolus contrast injection. Subsequent parametric perfusion maps were calculated using RAPID software. CONTRAST:  178mL OMNIPAQUE IOHEXOL 350 MG/ML SOLN COMPARISON:  Head CT earlier same day. FINDINGS: CTA NECK FINDINGS Aortic arch: Aortic atherosclerosis. No aneurysm or dissection. Branching pattern is normal. Right carotid system: Common carotid artery widely patent to the bifurcation. Mild  atherosclerotic plaque at the ICA bulb but no stenosis. Cervical ICA widely patent beyond that. Left carotid system: Common carotid artery widely patent to the bifurcation. Mild atherosclerotic plaque at the bifurcation but no stenosis. Cervical ICA widely patent beyond that. Vertebral arteries: Both vertebral artery origins show some adjacent plaque but no stenosis. Left vertebral artery is dominant. Both vertebral arteries widely patent through the cervical region to the foramen magnum. Skeleton: Ordinary cervical spondylosis. Other neck: No mass or lymphadenopathy. Upper chest: Patchy pulmonary infiltrates consistent with pneumonia. Patient with recent coronavirus diagnosis. Review of the MIP images confirms the above findings CTA HEAD FINDINGS Anterior circulation: Both internal carotid arteries are patent through the skull base and siphon regions. Siphon atherosclerotic calcification but without stenosis greater than 30%. Anterior and middle cerebral vessels are patent without proximal stenosis, aneurysm or vascular malformation. No large or medium vessel occlusion. Posterior circulation: Both vertebral arteries are widely patent to the basilar. No basilar stenosis. Posterior circulation branch vessels are patent and unremarkable. Venous  sinuses: Patent and normal. Anatomic variants: None significant. Review of the MIP images confirms the above findings CT Brain Perfusion Findings: ASPECTS: 10 CBF (<30%) Volume: 30mL Perfusion (Tmax>6.0s) volume: 89mL reported by the program. I think this is artifactual and related to patient motion. This is bilateral and symmetric affecting the inferior cortex. Mismatch Volume: 18mL reported, but in actuality I do not believe there is an acute insult. Infarction Location:None IMPRESSION: No large or medium vessel occlusion. Ordinary mild atherosclerosis of the carotid bifurcations and carotid siphons but without flow limiting stenosis. Perfusion imaging does not show an  infarction. I do not believe there is any true at risk brain. There is a good deal of motion during this examination, explaining the T-max data, which I believe is artifactual. Case discussed by telephone with Dr. Malen Gauze at 1932 hours. Electronically Signed   By: Nelson Chimes M.D.   On: 02/19/2019 19:39   DG Chest Port 1 View  Result Date: 02/19/2019 CLINICAL DATA:  Shortness of breath, dialysis, altered mental status EXAM: PORTABLE CHEST 1 VIEW COMPARISON:  08/04/2016 FINDINGS: Mild cardiomegaly. Both lungs are clear. The visualized skeletal structures are unremarkable. IMPRESSION: Mild cardiomegaly without acute abnormality of the lungs in AP portable projection. Electronically Signed   By: Naren Candle M.D.   On: 02/19/2019 20:33   CT HEAD CODE STROKE WO CONTRAST  Result Date: 02/19/2019 CLINICAL DATA:  Code stroke. Dialysis patient with expressive aphasia. EXAM: CT HEAD WITHOUT CONTRAST TECHNIQUE: Contiguous axial images were obtained from the base of the skull through the vertex without intravenous contrast. COMPARISON:  None. FINDINGS: Brain: The study suffers from motion degradation. Allowing for that, there is no evidence of old or acute infarction, mass lesion, hemorrhage, hydrocephalus or extra-axial collection. Vascular: There is atherosclerotic calcification of the major vessels at the base of the brain. Skull: Negative Sinuses/Orbits: Clear/normal Other: None ASPECTS ([place] Stroke Program Early CT Score) - Ganglionic level infarction (caudate, lentiform nuclei, internal capsule, insula, M1-M3 cortex): 7 - Supraganglionic infarction (M4-M6 cortex): 3 Total score (0-10 with 10 being normal): 10 IMPRESSION: 1. Motion degraded but otherwise normal examination. 2. ASPECTS is 10. 3. These results were communicated to Dr. Rory Percy at [place] 1/3/2021by text page via the [facility] messaging system. Electronically Signed   By: Nelson Chimes M.D.   On: 02/19/2019 19:12        Scheduled Meds: .  Chlorhexidine Gluconate Cloth  6 each Topical Q0600  . dexamethasone (DECADRON) injection  6 mg Intravenous Q24H  . febuxostat  20 mg Oral Daily  . gabapentin  300 mg Oral QPM  . heparin  7,500 Units Subcutaneous Q8H  . multivitamin  1 tablet Oral QPM  . sevelamer carbonate  2,400 mg Oral TID WC  . sodium chloride flush  3 mL Intravenous Q12H   Continuous Infusions: . azithromycin 500 mg (02/21/19 0225)  . cefTRIAXone (ROCEPHIN)  IV 2 g (02/21/19 0133)  . remdesivir 100 mg in NS 100 mL 100 mg (02/21/19 1102)     LOS: 1 day    Time spent: 35 minutes    Lashai Grosch A Merrisa Skorupski, MD [facility]   If 7PM-7AM, please contact night-coverage [email or web address] Password [facility] 02/21/2019, 3:40 PM

## 2019-02-21 NOTE — Progress Notes (Signed)
[facility] NEPHROLOGY PROGRESS NOTE  Assessment/ Plan: Pt is a 69 y.o. yo male with HTN, gout, ESRD on HD MWF admitted with altered mental status and COVID-19 positive. Dialysis Orders:  MWF - [facility]  4.25hrs, BFR 425, DFR 800,  EDW 141.5kg, 2K/ 2.25Ca. Access: LU AVF  Heparin 8000 unit bolus Mircera 30 mcg q4wks - last 01/25/19 Calcitriol 1.5 mcg PO qHD  #AMS/toxic metabolic encephalopathy: Seen by neurology.  Imaging studies with no acute stroke.  #COVID-19 pneumonia: Currently being treated with remdesivir, steroid, Rocephin and azithromycin.  As per primary team.  # ESRD: TTS at [facility].  Plan for HD today.  Hyperkalemia noted.  Ordered Lokelma by primary team.  Expect to improve with dialysis.  # Anemia of CKD: Hemoglobin at goal.  No ESA.  # Secondary hyperparathyroidism: High phosphorus level.  On Renvela.  # HTN/volume: Blood pressure acceptable.  Subjective: Chart including medication, lab result and previous notes reviewed.  Patient was not directly examined because of COVID-19 status in order to minimize exposure and to preserve PPE.  The physical examination of primary team reviewed. Objective Vital signs in last 24 hours: Vitals:   02/20/19 2130 02/20/19 2222 02/21/19 0028 02/21/19 0500  BP: (!) 132/56 (!) 137/59 133/72 114/62  Pulse: 75 91 86 66  Resp: 16 16 19    Temp:   100 F (37.8 C) 99.6 F (37.6 C)  TempSrc:   Oral Oral  SpO2: 96% 96% 96% 93%   Weight change:   Intake/Output Summary (Last 24 hours) at 02/21/2019 0957 Last data filed at 02/20/2019 1307 Gross per 24 hour  Intake 150 ml  Output -  Net 150 ml       Labs: Basic Metabolic Panel: Recent Labs  Lab 02/19/19 1835 02/19/19 2043 02/20/19 0620 02/21/19 0326  NA 133* 129* 131* 132*  K 4.9 4.7 5.7* 5.9*  CL 91* 92* 89* 90*  CO2 24  --  25 23  GLUCOSE 129* 98 132* 111*  BUN 30* 33* 43* 67*  CREATININE 8.70* 9.70* 10.73* 13.37*  CALCIUM 9.1  --  9.0 8.7*   PHOS  --   --  7.5*  --    Liver Function Tests: Recent Labs  Lab 02/19/19 1835 02/20/19 0620 02/21/19 0326  AST 44* 43* 42*  ALT 47* 43 37  ALKPHOS 64 58 56  BILITOT 0.8 0.4 0.5  PROT 8.2* 7.4 6.2*  ALBUMIN 3.3* 3.0* 2.8*   No results for input(s): LIPASE, AMYLASE in the last 168 hours. No results for input(s): AMMONIA in the last 168 hours. CBC: Recent Labs  Lab 02/19/19 1835 02/19/19 2043 02/20/19 0620 02/21/19 0326  WBC 4.8  --  4.4 5.7  NEUTROABS 3.7  --  3.5 4.8  HGB 11.5* 11.9* 11.2* 10.5*  HCT 35.5* 35.0* 35.0* 31.6*  MCV 99.2  --  100.6* 98.1  PLT 170  --  156 157   Cardiac Enzymes: No results for input(s): CKTOTAL, CKMB, CKMBINDEX, TROPONINI in the last 168 hours. CBG: Recent Labs  Lab 02/19/19 1850  GLUCAP 106*    Iron Studies:  Recent Labs    02/21/19 0326  FERRITIN 3,155*   Studies/Results: CT Code Stroke CTA Head W/WO contrast  Result Date: 02/19/2019 CLINICAL DATA:  Dialysis patient with acute mental status changes. EXAM: CT ANGIOGRAPHY HEAD AND NECK CT PERFUSION BRAIN TECHNIQUE: Multidetector CT imaging of the head and neck was performed using the standard protocol during bolus administration of intravenous contrast. Multiplanar CT  image reconstructions and MIPs were obtained to evaluate the vascular anatomy. Carotid stenosis measurements (when applicable) are obtained utilizing NASCET criteria, using the distal internal carotid diameter as the denominator. Multiphase CT imaging of the brain was performed following IV bolus contrast injection. Subsequent parametric perfusion maps were calculated using RAPID software. CONTRAST:  174mL OMNIPAQUE IOHEXOL 350 MG/ML SOLN COMPARISON:  Head CT earlier same day. FINDINGS: CTA NECK FINDINGS Aortic arch: Aortic atherosclerosis. No aneurysm or dissection. Branching pattern is normal. Right carotid system: Common carotid artery widely patent to the bifurcation. Mild atherosclerotic plaque at the ICA bulb but no  stenosis. Cervical ICA widely patent beyond that. Left carotid system: Common carotid artery widely patent to the bifurcation. Mild atherosclerotic plaque at the bifurcation but no stenosis. Cervical ICA widely patent beyond that. Vertebral arteries: Both vertebral artery origins show some adjacent plaque but no stenosis. Left vertebral artery is dominant. Both vertebral arteries widely patent through the cervical region to the foramen magnum. Skeleton: Ordinary cervical spondylosis. Other neck: No mass or lymphadenopathy. Upper chest: Patchy pulmonary infiltrates consistent with pneumonia. Patient with recent coronavirus diagnosis. Review of the MIP images confirms the above findings CTA HEAD FINDINGS Anterior circulation: Both internal carotid arteries are patent through the skull base and siphon regions. Siphon atherosclerotic calcification but without stenosis greater than 30%. Anterior and middle cerebral vessels are patent without proximal stenosis, aneurysm or vascular malformation. No large or medium vessel occlusion. Posterior circulation: Both vertebral arteries are widely patent to the basilar. No basilar stenosis. Posterior circulation branch vessels are patent and unremarkable. Venous sinuses: Patent and normal. Anatomic variants: None significant. Review of the MIP images confirms the above findings CT Brain Perfusion Findings: ASPECTS: 10 CBF (<30%) Volume: 58mL Perfusion (Tmax>6.0s) volume: 63mL reported by the program. I think this is artifactual and related to patient motion. This is bilateral and symmetric affecting the inferior cortex. Mismatch Volume: 74mL reported, but in actuality I do not believe there is an acute insult. Infarction Location:None IMPRESSION: No large or medium vessel occlusion. Ordinary mild atherosclerosis of the carotid bifurcations and carotid siphons but without flow limiting stenosis. Perfusion imaging does not show an infarction. I do not believe there is any true at  risk brain. There is a good deal of motion during this examination, explaining the T-max data, which I believe is artifactual. Case discussed by telephone with Dr. Malen Gauze at 1932 hours. Electronically Signed   By: Nelson Chimes M.D.   On: 02/19/2019 19:39   CT Code Stroke CTA Neck W/WO contrast  Result Date: 02/19/2019 CLINICAL DATA:  Dialysis patient with acute mental status changes. EXAM: CT ANGIOGRAPHY HEAD AND NECK CT PERFUSION BRAIN TECHNIQUE: Multidetector CT imaging of the head and neck was performed using the standard protocol during bolus administration of intravenous contrast. Multiplanar CT image reconstructions and MIPs were obtained to evaluate the vascular anatomy. Carotid stenosis measurements (when applicable) are obtained utilizing NASCET criteria, using the distal internal carotid diameter as the denominator. Multiphase CT imaging of the brain was performed following IV bolus contrast injection. Subsequent parametric perfusion maps were calculated using RAPID software. CONTRAST:  123mL OMNIPAQUE IOHEXOL 350 MG/ML SOLN COMPARISON:  Head CT earlier same day. FINDINGS: CTA NECK FINDINGS Aortic arch: Aortic atherosclerosis. No aneurysm or dissection. Branching pattern is normal. Right carotid system: Common carotid artery widely patent to the bifurcation. Mild atherosclerotic plaque at the ICA bulb but no stenosis. Cervical ICA widely patent beyond that. Left carotid system: Common carotid artery widely patent to  the bifurcation. Mild atherosclerotic plaque at the bifurcation but no stenosis. Cervical ICA widely patent beyond that. Vertebral arteries: Both vertebral artery origins show some adjacent plaque but no stenosis. Left vertebral artery is dominant. Both vertebral arteries widely patent through the cervical region to the foramen magnum. Skeleton: Ordinary cervical spondylosis. Other neck: No mass or lymphadenopathy. Upper chest: Patchy pulmonary infiltrates consistent with pneumonia. Patient  with recent coronavirus diagnosis. Review of the MIP images confirms the above findings CTA HEAD FINDINGS Anterior circulation: Both internal carotid arteries are patent through the skull base and siphon regions. Siphon atherosclerotic calcification but without stenosis greater than 30%. Anterior and middle cerebral vessels are patent without proximal stenosis, aneurysm or vascular malformation. No large or medium vessel occlusion. Posterior circulation: Both vertebral arteries are widely patent to the basilar. No basilar stenosis. Posterior circulation branch vessels are patent and unremarkable. Venous sinuses: Patent and normal. Anatomic variants: None significant. Review of the MIP images confirms the above findings CT Brain Perfusion Findings: ASPECTS: 10 CBF (<30%) Volume: 30mL Perfusion (Tmax>6.0s) volume: 67mL reported by the program. I think this is artifactual and related to patient motion. This is bilateral and symmetric affecting the inferior cortex. Mismatch Volume: 76mL reported, but in actuality I do not believe there is an acute insult. Infarction Location:None IMPRESSION: No large or medium vessel occlusion. Ordinary mild atherosclerosis of the carotid bifurcations and carotid siphons but without flow limiting stenosis. Perfusion imaging does not show an infarction. I do not believe there is any true at risk brain. There is a good deal of motion during this examination, explaining the T-max data, which I believe is artifactual. Case discussed by telephone with Dr. Malen Gauze at 1932 hours. Electronically Signed   By: Nelson Chimes M.D.   On: 02/19/2019 19:39   CT Code Stroke Cerebral Perfusion with contrast  Result Date: 02/19/2019 CLINICAL DATA:  Dialysis patient with acute mental status changes. EXAM: CT ANGIOGRAPHY HEAD AND NECK CT PERFUSION BRAIN TECHNIQUE: Multidetector CT imaging of the head and neck was performed using the standard protocol during bolus administration of intravenous contrast.  Multiplanar CT image reconstructions and MIPs were obtained to evaluate the vascular anatomy. Carotid stenosis measurements (when applicable) are obtained utilizing NASCET criteria, using the distal internal carotid diameter as the denominator. Multiphase CT imaging of the brain was performed following IV bolus contrast injection. Subsequent parametric perfusion maps were calculated using RAPID software. CONTRAST:  153mL OMNIPAQUE IOHEXOL 350 MG/ML SOLN COMPARISON:  Head CT earlier same day. FINDINGS: CTA NECK FINDINGS Aortic arch: Aortic atherosclerosis. No aneurysm or dissection. Branching pattern is normal. Right carotid system: Common carotid artery widely patent to the bifurcation. Mild atherosclerotic plaque at the ICA bulb but no stenosis. Cervical ICA widely patent beyond that. Left carotid system: Common carotid artery widely patent to the bifurcation. Mild atherosclerotic plaque at the bifurcation but no stenosis. Cervical ICA widely patent beyond that. Vertebral arteries: Both vertebral artery origins show some adjacent plaque but no stenosis. Left vertebral artery is dominant. Both vertebral arteries widely patent through the cervical region to the foramen magnum. Skeleton: Ordinary cervical spondylosis. Other neck: No mass or lymphadenopathy. Upper chest: Patchy pulmonary infiltrates consistent with pneumonia. Patient with recent coronavirus diagnosis. Review of the MIP images confirms the above findings CTA HEAD FINDINGS Anterior circulation: Both internal carotid arteries are patent through the skull base and siphon regions. Siphon atherosclerotic calcification but without stenosis greater than 30%. Anterior and middle cerebral vessels are patent without proximal stenosis, aneurysm or  vascular malformation. No large or medium vessel occlusion. Posterior circulation: Both vertebral arteries are widely patent to the basilar. No basilar stenosis. Posterior circulation branch vessels are patent and  unremarkable. Venous sinuses: Patent and normal. Anatomic variants: None significant. Review of the MIP images confirms the above findings CT Brain Perfusion Findings: ASPECTS: 10 CBF (<30%) Volume: 51mL Perfusion (Tmax>6.0s) volume: 20mL reported by the program. I think this is artifactual and related to patient motion. This is bilateral and symmetric affecting the inferior cortex. Mismatch Volume: 49mL reported, but in actuality I do not believe there is an acute insult. Infarction Location:None IMPRESSION: No large or medium vessel occlusion. Ordinary mild atherosclerosis of the carotid bifurcations and carotid siphons but without flow limiting stenosis. Perfusion imaging does not show an infarction. I do not believe there is any true at risk brain. There is a good deal of motion during this examination, explaining the T-max data, which I believe is artifactual. Case discussed by telephone with Dr. Malen Gauze at 1932 hours. Electronically Signed   By: Nelson Chimes M.D.   On: 02/19/2019 19:39   DG Chest Port 1 View  Result Date: 02/19/2019 CLINICAL DATA:  Shortness of breath, dialysis, altered mental status EXAM: PORTABLE CHEST 1 VIEW COMPARISON:  08/04/2016 FINDINGS: Mild cardiomegaly. Both lungs are clear. The visualized skeletal structures are unremarkable. IMPRESSION: Mild cardiomegaly without acute abnormality of the lungs in AP portable projection. Electronically Signed   By: Holten Candle M.D.   On: 02/19/2019 20:33   CT HEAD CODE STROKE WO CONTRAST  Result Date: 02/19/2019 CLINICAL DATA:  Code stroke. Dialysis patient with expressive aphasia. EXAM: CT HEAD WITHOUT CONTRAST TECHNIQUE: Contiguous axial images were obtained from the base of the skull through the vertex without intravenous contrast. COMPARISON:  None. FINDINGS: Brain: The study suffers from motion degradation. Allowing for that, there is no evidence of old or acute infarction, mass lesion, hemorrhage, hydrocephalus or extra-axial collection.  Vascular: There is atherosclerotic calcification of the major vessels at the base of the brain. Skull: Negative Sinuses/Orbits: Clear/normal Other: None ASPECTS ([facility] Stroke Program Early CT Score) - Ganglionic level infarction (caudate, lentiform nuclei, internal capsule, insula, M1-M3 cortex): 7 - Supraganglionic infarction (M4-M6 cortex): 3 Total score (0-10 with 10 being normal): 10 IMPRESSION: 1. Motion degraded but otherwise normal examination. 2. ASPECTS is 10. 3. These results were communicated to Dr. Rory Percy at [place] 1/3/2021by text page via the [facility] messaging system. Electronically Signed   By: Nelson Chimes M.D.   On: 02/19/2019 19:12    Medications: Infusions: . azithromycin 500 mg (02/21/19 0225)  . cefTRIAXone (ROCEPHIN)  IV 2 g (02/21/19 0133)  . remdesivir 100 mg in NS 100 mL Stopped (02/20/19 1154)    Scheduled Medications: . Chlorhexidine Gluconate Cloth  6 each Topical Q0600  . dexamethasone (DECADRON) injection  6 mg Intravenous Q24H  . febuxostat  20 mg Oral Daily  . gabapentin  300 mg Oral QPM  . heparin  5,000 Units Subcutaneous Q8H  . multivitamin  1 tablet Oral QPM  . sevelamer carbonate  2,400 mg Oral TID WC  . sodium chloride flush  3 mL Intravenous Q12H  . sodium zirconium cyclosilicate  10 g Oral Once    have reviewed scheduled and prn medications.  Karson Reede Tanna Furry 02/21/2019,9:57 AM  LOS: 1 day  Pager: [phone]

## 2019-02-21 NOTE — Progress Notes (Signed)
Richard Bush is a 69 y.o. male patient admitted from ED awake, alert - oriented  X 4 - no acute distress noted.  VSS - Blood pressure 133/72, pulse 86, temperature 100 F (37.8 C), temperature source Oral, resp. rate 19, SpO2 96 %.    IV in place, occlusive dsg intact without redness.  Orientation to room, and floor completed with information packet given to patient/family.  Patient declined safety video at this time.  Admission INP armband ID verified with patient/family, and in place.   SR up x 2, fall assessment complete, with patient and family able to verbalize understanding of risk associated with falls, and verbalized understanding to call nsg before up out of bed.  Call light within reach, patient able to voice, and demonstrate understanding.  Skin, clean-dry- intact without evidence of bruising, or skin tears.   No evidence of skin break down noted on exam.     Will cont to eval and treat per MD orders.  [place], RN 02/21/2019 0100

## 2019-02-21 NOTE — Progress Notes (Addendum)
PROGRESS NOTE    Richard Bush  ACZ:[id number] DOB: 1950/08/24 DOA: 02/19/2019 PCP: Quentin Cornwall, MD   Brief Narrative: 69 year old with past medical history significant for ESRD on dialysis, gout, hypertension, neuropathy and recent COVID-19 diagnosis who presents to the emergency department referred from his dialysis center for evaluation of confusion speech difficulty, gait difficulty and possible left-sided weakness.  He has been having mild cough, patient drove himself to the dialysis center, seems to be normal on arrival and at around 1:30 PM when preparing to leave the dialysis center there was concern for aphasia of balance and confusion. On arrival to the ED patient was febrile 39 Celsius, oxygen sat 89 on room air, tachycardic, chest x-ray cardiomegaly with no acute findings.  CTA head and neck negative for large or medium vessel occlusion.  Lactic acid 2.2.  There was concern the patient may have left arm drift and code a stroke was activated.  Neurology evaluated the patient and recommended MRI of the brain.  Assessment & Plan:   Principal Problem:   Acute encephalopathy Active Problems:   Idiopathic chronic gout without tophus   ESRD (end stage renal disease) ([place])   COVID-19 virus infection   Acute respiratory failure with hypoxia (HCC)   Pneumonia due to COVID-19 virus   1-Acute encephalopathy probably metabolic/toxic; Presents with dysarthria and left-sided weakness, altered mental status from hemodialysis. CT negative for acute finding.  CTA no large vessel occlusion.  Deficit has resolved in  the ED. Awaiting MRI In the setting of COVID-19 viral illness. Neurology recommend MRI brain.   2-Acute Hypoxic Respiratory Failure; New Oxygen requirement. Oxygen on RA 89. Currently on 3 L.  Started on dexamethasone and Remdesivir. Day 3.  Pro-calcitonin 0.65.  continue with ceftriaxone and azithro.   3-ESRD;  on hemodialysis: Hyperkalemia. Lokelma, calcium gluconate ordered.   Nephrology consulted. Plan for HD today.   4-Gout; Continue with urolic.  5-Transaminases;  Related to covid, follow trend.   6-Elevated D dimer; related to covid. Follow trend. On heparin for DVT prophylasix.    Chronic hypotension; resume midodrine with HD.   Morbid obesity; needs diet education.  Mild hyponatremia. Correction with HD   Estimated body mass index is 45.34 kg/m as calculated from the following:   Height as of 01/03/19: 5\' 10"  (1.778 m).   Weight as of 01/03/19: 143.3 kg.   DVT prophylaxis: Heparin  Code Status: full code Family Communication: care discussed with patient.  Disposition Plan: Patient with new hypoxic respiratory failure, new oxygen requirement secondary to COVID-19 pneumonia.  Hyperkalemia need to remain in the hospital for treatment of the same. Consultants:   none  Procedures:   None  Antimicrobials:  Ceftriaxone Azithromycin Remdesivir.   Subjective: He is feeling well, he denies dyspnea.  Speech clear.   Objective: Vitals:   02/20/19 2130 02/20/19 2222 02/21/19 0028 02/21/19 0500  BP: (!) 132/56 (!) 137/59 133/72 114/62  Pulse: 75 91 86 66  Resp: 16 16 19    Temp:   100 F (37.8 C) 99.6 F (37.6 C)  TempSrc:   Oral Oral  SpO2: 96% 96% 96% 93%    Intake/Output Summary (Last 24 hours) at 02/21/2019 1532 Last data filed at 02/21/2019 1242 Gross per 24 hour  Intake 240 ml  Output --  Net 240 ml   There were no vitals filed for this visit.  Examination:  General exam: NAD Respiratory system: CTA Cardiovascular system: S 1, S 2 RRR Gastrointestinal system: BS present, soft, nt  Central nervous system: Non focal.  Extremities: Symmetric power.  Skin: no rashes   Data Reviewed: I have personally reviewed following labs and imaging studies  CBC: Recent Labs  Lab 02/19/19 1835 02/19/19 2043 02/20/19 0620 02/21/19 0326  WBC 4.8  --  4.4 5.7  NEUTROABS 3.7  --  3.5 4.8  HGB 11.5* 11.9* 11.2* 10.5*  HCT 35.5*  35.0* 35.0* 31.6*  MCV 99.2  --  100.6* 98.1  PLT 170  --  156 950   Basic Metabolic Panel: Recent Labs  Lab 02/19/19 1835 02/19/19 2043 02/20/19 0620 02/21/19 0326  NA 133* 129* 131* 132*  K 4.9 4.7 5.7* 5.9*  CL 91* 92* 89* 90*  CO2 24  --  25 23  GLUCOSE 129* 98 132* 111*  BUN 30* 33* 43* 67*  CREATININE 8.70* 9.70* 10.73* 13.37*  CALCIUM 9.1  --  9.0 8.7*  MG  --   --  2.0  --   PHOS  --   --  7.5*  --    GFR: CrCl cannot be calculated (Unknown ideal weight.). Liver Function Tests: Recent Labs  Lab 02/19/19 1835 02/20/19 0620 02/21/19 0326  AST 44* 43* 42*  ALT 47* 43 37  ALKPHOS 64 58 56  BILITOT 0.8 0.4 0.5  PROT 8.2* 7.4 6.2*  ALBUMIN 3.3* 3.0* 2.8*   No results for input(s): LIPASE, AMYLASE in the last 168 hours. No results for input(s): AMMONIA in the last 168 hours. Coagulation Profile: Recent Labs  Lab 02/19/19 1835  INR 1.0   Cardiac Enzymes: No results for input(s): CKTOTAL, CKMB, CKMBINDEX, TROPONINI in the last 168 hours. BNP (last 3 results) No results for input(s): PROBNP in the last 8760 hours. HbA1C: No results for input(s): HGBA1C in the last 72 hours. CBG: Recent Labs  Lab 02/19/19 1850  GLUCAP 106*   Lipid Profile: Recent Labs    02/19/19 1835  TRIG 135   Thyroid Function Tests: Recent Labs    02/19/19 2206  TSH 2.069   Anemia Panel: Recent Labs    02/19/19 2206 02/20/19 0620 02/21/19 0326  VITAMINB12 1,170*  --   --   FERRITIN  --  3,086* 3,155*   Sepsis Labs: Recent Labs  Lab 02/19/19 1835 02/19/19 2033 02/20/19 0620 02/21/19 0326  PROCALCITON 0.65  --  0.83 0.91  LATICACIDVEN 2.2* 1.2  --   --     Recent Results (from the past 240 hour(s))  Blood Culture (routine x 2)     Status: None (Preliminary result)   Collection Time: 02/19/19  8:33 PM   Specimen: BLOOD RIGHT ARM  Result Value Ref Range Status   Specimen Description BLOOD RIGHT ARM  Final   Special Requests   Final    BOTTLES DRAWN AEROBIC  AND ANAEROBIC Blood Culture adequate volume   Culture   Final    NO GROWTH 2 DAYS Performed at [facility], [place] [address], [place], [place] [address]    Report Status PENDING  Incomplete         Radiology Studies: CT Code Stroke CTA Head W/WO contrast  Result Date: 02/19/2019 CLINICAL DATA:  Dialysis patient with acute mental status changes. EXAM: CT ANGIOGRAPHY HEAD AND NECK CT PERFUSION BRAIN TECHNIQUE: Multidetector CT imaging of the head and neck was performed using the standard protocol during bolus administration of intravenous contrast. Multiplanar CT image reconstructions and MIPs were obtained to evaluate the vascular anatomy. Carotid stenosis measurements (when applicable) are obtained utilizing  NASCET criteria, using the distal internal carotid diameter as the denominator. Multiphase CT imaging of the brain was performed following IV bolus contrast injection. Subsequent parametric perfusion maps were calculated using RAPID software. CONTRAST:  164mL OMNIPAQUE IOHEXOL 350 MG/ML SOLN COMPARISON:  Head CT earlier same day. FINDINGS: CTA NECK FINDINGS Aortic arch: Aortic atherosclerosis. No aneurysm or dissection. Branching pattern is normal. Right carotid system: Common carotid artery widely patent to the bifurcation. Mild atherosclerotic plaque at the ICA bulb but no stenosis. Cervical ICA widely patent beyond that. Left carotid system: Common carotid artery widely patent to the bifurcation. Mild atherosclerotic plaque at the bifurcation but no stenosis. Cervical ICA widely patent beyond that. Vertebral arteries: Both vertebral artery origins show some adjacent plaque but no stenosis. Left vertebral artery is dominant. Both vertebral arteries widely patent through the cervical region to the foramen magnum. Skeleton: Ordinary cervical spondylosis. Other neck: No mass or lymphadenopathy. Upper chest: Patchy pulmonary infiltrates consistent with pneumonia. Patient with recent coronavirus  diagnosis. Review of the MIP images confirms the above findings CTA HEAD FINDINGS Anterior circulation: Both internal carotid arteries are patent through the skull base and siphon regions. Siphon atherosclerotic calcification but without stenosis greater than 30%. Anterior and middle cerebral vessels are patent without proximal stenosis, aneurysm or vascular malformation. No large or medium vessel occlusion. Posterior circulation: Both vertebral arteries are widely patent to the basilar. No basilar stenosis. Posterior circulation branch vessels are patent and unremarkable. Venous sinuses: Patent and normal. Anatomic variants: None significant. Review of the MIP images confirms the above findings CT Brain Perfusion Findings: ASPECTS: 10 CBF (<30%) Volume: 28mL Perfusion (Tmax>6.0s) volume: 13mL reported by the program. I think this is artifactual and related to patient motion. This is bilateral and symmetric affecting the inferior cortex. Mismatch Volume: 4mL reported, but in actuality I do not believe there is an acute insult. Infarction Location:None IMPRESSION: No large or medium vessel occlusion. Ordinary mild atherosclerosis of the carotid bifurcations and carotid siphons but without flow limiting stenosis. Perfusion imaging does not show an infarction. I do not believe there is any true at risk brain. There is a good deal of motion during this examination, explaining the T-max data, which I believe is artifactual. Case discussed by telephone with Dr. Malen Gauze at 1932 hours. Electronically Signed   By: Nelson Chimes M.D.   On: 02/19/2019 19:39   CT Code Stroke CTA Neck W/WO contrast  Result Date: 02/19/2019 CLINICAL DATA:  Dialysis patient with acute mental status changes. EXAM: CT ANGIOGRAPHY HEAD AND NECK CT PERFUSION BRAIN TECHNIQUE: Multidetector CT imaging of the head and neck was performed using the standard protocol during bolus administration of intravenous contrast. Multiplanar CT image reconstructions  and MIPs were obtained to evaluate the vascular anatomy. Carotid stenosis measurements (when applicable) are obtained utilizing NASCET criteria, using the distal internal carotid diameter as the denominator. Multiphase CT imaging of the brain was performed following IV bolus contrast injection. Subsequent parametric perfusion maps were calculated using RAPID software. CONTRAST:  152mL OMNIPAQUE IOHEXOL 350 MG/ML SOLN COMPARISON:  Head CT earlier same day. FINDINGS: CTA NECK FINDINGS Aortic arch: Aortic atherosclerosis. No aneurysm or dissection. Branching pattern is normal. Right carotid system: Common carotid artery widely patent to the bifurcation. Mild atherosclerotic plaque at the ICA bulb but no stenosis. Cervical ICA widely patent beyond that. Left carotid system: Common carotid artery widely patent to the bifurcation. Mild atherosclerotic plaque at the bifurcation but no stenosis. Cervical ICA widely patent beyond that. Vertebral arteries:  Both vertebral artery origins show some adjacent plaque but no stenosis. Left vertebral artery is dominant. Both vertebral arteries widely patent through the cervical region to the foramen magnum. Skeleton: Ordinary cervical spondylosis. Other neck: No mass or lymphadenopathy. Upper chest: Patchy pulmonary infiltrates consistent with pneumonia. Patient with recent coronavirus diagnosis. Review of the MIP images confirms the above findings CTA HEAD FINDINGS Anterior circulation: Both internal carotid arteries are patent through the skull base and siphon regions. Siphon atherosclerotic calcification but without stenosis greater than 30%. Anterior and middle cerebral vessels are patent without proximal stenosis, aneurysm or vascular malformation. No large or medium vessel occlusion. Posterior circulation: Both vertebral arteries are widely patent to the basilar. No basilar stenosis. Posterior circulation branch vessels are patent and unremarkable. Venous sinuses: Patent and  normal. Anatomic variants: None significant. Review of the MIP images confirms the above findings CT Brain Perfusion Findings: ASPECTS: 10 CBF (<30%) Volume: 66mL Perfusion (Tmax>6.0s) volume: 59mL reported by the program. I think this is artifactual and related to patient motion. This is bilateral and symmetric affecting the inferior cortex. Mismatch Volume: 99mL reported, but in actuality I do not believe there is an acute insult. Infarction Location:None IMPRESSION: No large or medium vessel occlusion. Ordinary mild atherosclerosis of the carotid bifurcations and carotid siphons but without flow limiting stenosis. Perfusion imaging does not show an infarction. I do not believe there is any true at risk brain. There is a good deal of motion during this examination, explaining the T-max data, which I believe is artifactual. Case discussed by telephone with Dr. Malen Gauze at 1932 hours. Electronically Signed   By: Nelson Chimes M.D.   On: 02/19/2019 19:39   CT Code Stroke Cerebral Perfusion with contrast  Result Date: 02/19/2019 CLINICAL DATA:  Dialysis patient with acute mental status changes. EXAM: CT ANGIOGRAPHY HEAD AND NECK CT PERFUSION BRAIN TECHNIQUE: Multidetector CT imaging of the head and neck was performed using the standard protocol during bolus administration of intravenous contrast. Multiplanar CT image reconstructions and MIPs were obtained to evaluate the vascular anatomy. Carotid stenosis measurements (when applicable) are obtained utilizing NASCET criteria, using the distal internal carotid diameter as the denominator. Multiphase CT imaging of the brain was performed following IV bolus contrast injection. Subsequent parametric perfusion maps were calculated using RAPID software. CONTRAST:  139mL OMNIPAQUE IOHEXOL 350 MG/ML SOLN COMPARISON:  Head CT earlier same day. FINDINGS: CTA NECK FINDINGS Aortic arch: Aortic atherosclerosis. No aneurysm or dissection. Branching pattern is normal. Right carotid  system: Common carotid artery widely patent to the bifurcation. Mild atherosclerotic plaque at the ICA bulb but no stenosis. Cervical ICA widely patent beyond that. Left carotid system: Common carotid artery widely patent to the bifurcation. Mild atherosclerotic plaque at the bifurcation but no stenosis. Cervical ICA widely patent beyond that. Vertebral arteries: Both vertebral artery origins show some adjacent plaque but no stenosis. Left vertebral artery is dominant. Both vertebral arteries widely patent through the cervical region to the foramen magnum. Skeleton: Ordinary cervical spondylosis. Other neck: No mass or lymphadenopathy. Upper chest: Patchy pulmonary infiltrates consistent with pneumonia. Patient with recent coronavirus diagnosis. Review of the MIP images confirms the above findings CTA HEAD FINDINGS Anterior circulation: Both internal carotid arteries are patent through the skull base and siphon regions. Siphon atherosclerotic calcification but without stenosis greater than 30%. Anterior and middle cerebral vessels are patent without proximal stenosis, aneurysm or vascular malformation. No large or medium vessel occlusion. Posterior circulation: Both vertebral arteries are widely patent to the basilar.  No basilar stenosis. Posterior circulation branch vessels are patent and unremarkable. Venous sinuses: Patent and normal. Anatomic variants: None significant. Review of the MIP images confirms the above findings CT Brain Perfusion Findings: ASPECTS: 10 CBF (<30%) Volume: 20mL Perfusion (Tmax>6.0s) volume: 24mL reported by the program. I think this is artifactual and related to patient motion. This is bilateral and symmetric affecting the inferior cortex. Mismatch Volume: 55mL reported, but in actuality I do not believe there is an acute insult. Infarction Location:None IMPRESSION: No large or medium vessel occlusion. Ordinary mild atherosclerosis of the carotid bifurcations and carotid siphons but  without flow limiting stenosis. Perfusion imaging does not show an infarction. I do not believe there is any true at risk brain. There is a good deal of motion during this examination, explaining the T-max data, which I believe is artifactual. Case discussed by telephone with Dr. Malen Gauze at 1932 hours. Electronically Signed   By: Nelson Chimes M.D.   On: 02/19/2019 19:39   DG Chest Port 1 View  Result Date: 02/19/2019 CLINICAL DATA:  Shortness of breath, dialysis, altered mental status EXAM: PORTABLE CHEST 1 VIEW COMPARISON:  08/04/2016 FINDINGS: Mild cardiomegaly. Both lungs are clear. The visualized skeletal structures are unremarkable. IMPRESSION: Mild cardiomegaly without acute abnormality of the lungs in AP portable projection. Electronically Signed   By: Augie Candle M.D.   On: 02/19/2019 20:33   CT HEAD CODE STROKE WO CONTRAST  Result Date: 02/19/2019 CLINICAL DATA:  Code stroke. Dialysis patient with expressive aphasia. EXAM: CT HEAD WITHOUT CONTRAST TECHNIQUE: Contiguous axial images were obtained from the base of the skull through the vertex without intravenous contrast. COMPARISON:  None. FINDINGS: Brain: The study suffers from motion degradation. Allowing for that, there is no evidence of old or acute infarction, mass lesion, hemorrhage, hydrocephalus or extra-axial collection. Vascular: There is atherosclerotic calcification of the major vessels at the base of the brain. Skull: Negative Sinuses/Orbits: Clear/normal Other: None ASPECTS ([place] Stroke Program Early CT Score) - Ganglionic level infarction (caudate, lentiform nuclei, internal capsule, insula, M1-M3 cortex): 7 - Supraganglionic infarction (M4-M6 cortex): 3 Total score (0-10 with 10 being normal): 10 IMPRESSION: 1. Motion degraded but otherwise normal examination. 2. ASPECTS is 10. 3. These results were communicated to Dr. Rory Percy at [place] 1/3/2021by text page via the [facility] messaging system. Electronically Signed   By: Nelson Chimes  M.D.   On: 02/19/2019 19:12        Scheduled Meds:  Chlorhexidine Gluconate Cloth  6 each Topical Q0600   dexamethasone (DECADRON) injection  6 mg Intravenous Q24H   febuxostat  20 mg Oral Daily   gabapentin  300 mg Oral QPM   heparin  7,500 Units Subcutaneous Q8H   multivitamin  1 tablet Oral QPM   sevelamer carbonate  2,400 mg Oral TID WC   sodium chloride flush  3 mL Intravenous Q12H   Continuous Infusions:  azithromycin 500 mg (02/21/19 0225)   cefTRIAXone (ROCEPHIN)  IV 2 g (02/21/19 0133)   remdesivir 100 mg in NS 100 mL 100 mg (02/21/19 1102)     LOS: 1 day    Time spent: 35 minutes    Saben Donigan A Celene Pippins, MD [facility]   If 7PM-7AM, please contact night-coverage [email or web address] Password [facility] 02/21/2019, 3:32 PM

## 2019-02-22 ENCOUNTER — Inpatient Hospital Stay (HOSPITAL_COMMUNITY): Payer: Medicare Other

## 2019-02-22 LAB — COMPREHENSIVE METABOLIC PANEL
ALT: 34 U/L (ref 0–44)
AST: 41 U/L (ref 15–41)
Albumin: 2.8 g/dL — ABNORMAL LOW (ref 3.5–5.0)
Alkaline Phosphatase: 50 U/L (ref 38–126)
Anion gap: 22 — ABNORMAL HIGH (ref 5–15)
BUN: 70 mg/dL — ABNORMAL HIGH (ref 8–23)
CO2: 23 mmol/L (ref 22–32)
Calcium: 9 mg/dL (ref 8.9–10.3)
Chloride: 90 mmol/L — ABNORMAL LOW (ref 98–111)
Creatinine, Ser: 12.11 mg/dL — ABNORMAL HIGH (ref 0.61–1.24)
GFR calc Af Amer: 4 mL/min — ABNORMAL LOW (ref >60)
GFR calc non Af Amer: 4 mL/min — ABNORMAL LOW (ref >60)
Glucose, Bld: 112 mg/dL — ABNORMAL HIGH (ref 70–99)
Potassium: 4.9 mmol/L (ref 3.5–5.1)
Sodium: 135 mmol/L (ref 135–145)
Total Bilirubin: 0.2 mg/dL — ABNORMAL LOW (ref 0.3–1.2)
Total Protein: 6.9 g/dL (ref 6.5–8.1)

## 2019-02-22 LAB — CBC WITH DIFFERENTIAL/PLATELET
Abs Immature Granulocytes: 0.02 10*3/uL (ref 0.00–0.07)
Basophils Absolute: 0 10*3/uL (ref 0.0–0.1)
Basophils Relative: 0 %
Eosinophils Absolute: 0 10*3/uL (ref 0.0–0.5)
Eosinophils Relative: 0 %
HCT: 29.2 % — ABNORMAL LOW (ref 39.0–52.0)
Hemoglobin: 9.6 g/dL — ABNORMAL LOW (ref 13.0–17.0)
Immature Granulocytes: 0 %
Lymphocytes Relative: 16 %
Lymphs Abs: 0.9 10*3/uL (ref 0.7–4.0)
MCH: 32.3 pg (ref 26.0–34.0)
MCHC: 32.9 g/dL (ref 30.0–36.0)
MCV: 98.3 fL (ref 80.0–100.0)
Monocytes Absolute: 0.5 10*3/uL (ref 0.1–1.0)
Monocytes Relative: 9 %
Neutro Abs: 4.3 10*3/uL (ref 1.7–7.7)
Neutrophils Relative %: 75 %
Platelets: 177 10*3/uL (ref 150–400)
RBC: 2.97 MIL/uL — ABNORMAL LOW (ref 4.22–5.81)
RDW: 13.2 % (ref 11.5–15.5)
WBC: 5.8 10*3/uL (ref 4.0–10.5)
nRBC: 0 % (ref 0.0–0.2)

## 2019-02-22 LAB — D-DIMER, QUANTITATIVE: D-Dimer, Quant: 1.9 ug/mL-FEU — ABNORMAL HIGH (ref 0.00–0.50)

## 2019-02-22 LAB — FERRITIN: Ferritin: 2632 ng/mL — ABNORMAL HIGH (ref 24–336)

## 2019-02-22 LAB — C-REACTIVE PROTEIN: CRP: 8.9 mg/dL — ABNORMAL HIGH (ref <1.0)

## 2019-02-22 LAB — T.PALLIDUM AB, TOTAL: T Pallidum Abs: NONREACTIVE

## 2019-02-22 MED ORDER — DEXTROMETHORPHAN POLISTIREX ER 30 MG/5ML PO SUER
30.0000 mg | Freq: Two times a day (BID) | ORAL | Status: DC | PRN
  Filled 2019-02-22 (×2): qty 5

## 2019-02-22 MED ORDER — DARBEPOETIN ALFA 60 MCG/0.3ML IJ SOSY
60.0000 ug | PREFILLED_SYRINGE | INTRAMUSCULAR | Status: DC
  Filled 2019-02-22 (×4): qty 0.3

## 2019-02-22 MED ORDER — CHLORHEXIDINE GLUCONATE CLOTH 2 % EX PADS
6.0000 | MEDICATED_PAD | Freq: Every day | CUTANEOUS | Status: DC
  Administered 2019-02-22: 18:00:00 6 via TOPICAL

## 2019-02-22 MED ORDER — LORAZEPAM 2 MG/ML IJ SOLN
2.0000 mg | Freq: Once | INTRAMUSCULAR | Status: AC | PRN
  Administered 2019-02-22: 22:00:00 2 mg via INTRAVENOUS
  Filled 2019-02-22: qty 1

## 2019-02-22 NOTE — Progress Notes (Signed)
Richard Bush NEPHROLOGY PROGRESS NOTE  Assessment/ Plan: Pt is a 69 y.o. yo male with HTN, gout, ESRD on HD MWF admitted with altered mental status and COVID-19 positive. Dialysis Orders:  MWF - [facility]  4.25hrs, BFR 425, DFR 800,  EDW 141.5kg, 2K/ 2.25Ca. Access: LU AVF  Heparin 8000 unit bolus Mircera 30 mcg q4wks - last 01/25/19 Calcitriol 1.5 mcg PO qHD  #AMS/toxic metabolic encephalopathy: Seen by neurology.  Imaging studies with no acute stroke.  Pending MRI.  #COVID-19 pneumonia: Currently being treated with remdesivir, steroid, Rocephin and azithromycin.  As per primary team.  # ESRD: TTS at [facility].  Status post HD yesterday with 2000 cc UF.  Tolerated well.  Plan for HD tomorrow.    # Anemia of CKD: Hemoglobin was at goal, slight drop noted today.  Resume Aranesp.  # Secondary hyperparathyroidism: High phosphorus level.  On Renvela, resumed.  # HTN/volume: Blood pressure acceptable.  Subjective: Chart including medication, lab result and previous notes reviewed.  Patient was not directly examined because of COVID-19 status in order to minimize exposure and to preserve PPE.  The physical examination of primary team reviewed. Objective Vital signs in last 24 hours: Vitals:   02/22/19 0000 02/22/19 0030 02/22/19 0103 02/22/19 0422  BP: (!) 114/51 122/61 (!) 122/59 130/67  Pulse: 64 65 67 64  Resp:   (!) 22 15  Temp:   98 F (36.7 C) 98 F (36.7 C)  TempSrc:   Oral Oral  SpO2:   93% 94%  Weight:   (!) 137.5 kg    Weight change:   Intake/Output Summary (Last 24 hours) at 02/22/2019 1017 Last data filed at 02/22/2019 4627 Gross per 24 hour  Intake 1450.25 ml  Output 2000 ml  Net -549.75 ml       Labs: Basic Metabolic Panel: Recent Labs  Lab 02/20/19 0620 02/21/19 0326 02/21/19 1806 02/22/19 0644  NA 131* 132* 135 135  K 5.7* 5.9* 5.5* 4.9  CL 89* 90* 89* 90*  CO2 25 23 24 23   GLUCOSE 132* 111* 114* 112*  BUN 43* 67* 85* 70*   CREATININE 10.73* 13.37* 14.64* 12.11*  CALCIUM 9.0 8.7* 9.2 9.0  PHOS 7.5*  --   --   --    Liver Function Tests: Recent Labs  Lab 02/20/19 0620 02/21/19 0326 02/22/19 0644  AST 43* 42* 41  ALT 43 37 34  ALKPHOS 58 56 50  BILITOT 0.4 0.5 0.2*  PROT 7.4 6.2* 6.9  ALBUMIN 3.0* 2.8* 2.8*   No results for input(s): LIPASE, AMYLASE in the last 168 hours. No results for input(s): AMMONIA in the last 168 hours. CBC: Recent Labs  Lab 02/19/19 1835 02/20/19 0620 02/21/19 0326 02/21/19 2343 02/22/19 0644  WBC 4.8 4.4 5.7 6.1 5.8  NEUTROABS 3.7 3.5 4.8  --  4.3  HGB 11.5* 11.2* 10.5* 10.8* 9.6*  HCT 35.5* 35.0* 31.6* 33.3* 29.2*  MCV 99.2 100.6* 98.1 98.5 98.3  PLT 170 156 157 187 177   Cardiac Enzymes: No results for input(s): CKTOTAL, CKMB, CKMBINDEX, TROPONINI in the last 168 hours. CBG: Recent Labs  Lab 02/19/19 1850  GLUCAP 106*    Iron Studies:  Recent Labs    02/22/19 0644  FERRITIN 2,632*   Studies/Results: No results found.  Medications: Infusions: . sodium chloride    . sodium chloride    . azithromycin 500 mg (02/22/19 0204)  . cefTRIAXone (ROCEPHIN)  IV 2 g (02/22/19 0407)  . remdesivir  100 mg in NS 100 mL 100 mg (02/21/19 1102)    Scheduled Medications: . Chlorhexidine Gluconate Cloth  6 each Topical Q0600  . dexamethasone (DECADRON) injection  6 mg Intravenous Q24H  . febuxostat  20 mg Oral Daily  . gabapentin  300 mg Oral QPM  . heparin  7,500 Units Subcutaneous Q8H  . heparin  8,000 Units Dialysis Once in dialysis  . multivitamin  1 tablet Oral QPM  . sevelamer carbonate  2,400 mg Oral TID WC  . sodium chloride flush  3 mL Intravenous Q12H    have reviewed scheduled and prn medications.  Richard Bush Richard Bush 02/22/2019,10:17 AM  LOS: 2 days  Pager: [phone]

## 2019-02-22 NOTE — Progress Notes (Signed)
PROGRESS NOTE    Richard Bush  KDT:[id number] DOB: 1951-02-13 DOA: 02/19/2019 PCP: Quentin Cornwall, MD   Brief Narrative: 69 year old with past medical history significant for ESRD on dialysis, gout, hypertension, neuropathy and recent COVID-19 diagnosis who presents to the emergency department referred from his dialysis center for evaluation of confusion speech difficulty, gait difficulty and possible left-sided weakness.  He has been having mild cough, patient drove himself to the dialysis center, seems to be normal on arrival and at around 1:30 PM when preparing to leave the dialysis center there was concern for aphasia of balance and confusion. On arrival to the ED patient was febrile 39 Celsius, oxygen sat 89 on room air, tachycardic, chest x-ray cardiomegaly with no acute findings.  CTA head and neck negative for large or medium vessel occlusion.  Lactic acid 2.2.  There was concern the patient may have left arm drift and code a stroke was activated.  Neurology evaluated the patient and recommended MRI of the brain.  Assessment & Plan:   Principal Problem:   Acute encephalopathy Active Problems:   Idiopathic chronic gout without tophus   ESRD (end stage renal disease) ([facility])   COVID-19 virus infection   Acute respiratory failure with hypoxia (H[facility])   Pneumonia due to COVID-19 virus   1-Acute encephalopathy probably metabolic/toxic; Presents with dysarthria and left-sided weakness, altered mental status from hemodialysis. CT negative for acute finding.  CTA no large vessel occlusion.  Deficit has resolved in  the ED. Awaiting MRI, ordered Ativan for MRI due to hx of claustrophobia  In the setting of COVID-19 viral illness. Neurology recommended MRI brain which is ordered but pending.    2-Acute Hypoxic Respiratory Failure; New Oxygen requirement. Oxygen on RA 89. Currently on 2-3 L.  Started on dexamethasone and Remdesivir.   Pro-calcitonin 0.65.  continue with ceftriaxone and azithro.   Start Delsyum as needed for cough  3-ESRD;  on hemodialysis: Hyperkalemia. Lokelma, calcium gluconate ordered.  Nephrology consulted. Continue HD per their recommendations.    4-Gout; Continue with urolic.  5-Transaminases;  Related to covid, follow trend.   6-Elevated D dimer; related to covid. Follow trend. On heparin for DVT prophylasix.    Chronic hypotension; continue midodrine with HD.   Morbid obesity; needs diet education.  Mild hyponatremia. Correction with HD   Estimated body mass index is 43.49 kg/m as calculated from the following:   Height as of 01/03/19: 5\' 10"  (1.778 m).   Weight as of this encounter: 137.5 kg.   DVT prophylaxis: Heparin  Code Status: full code Family Communication: care discussed with patient.  Disposition Plan: Patient with new hypoxic respiratory failure, new oxygen requirement secondary to COVID-19 pneumonia.  Hyperkalemia need to remain in the hospital for treatment of the same. Consultants:   none  Procedures:   None  Antimicrobials:  Ceftriaxone Azithromycin Remdesivir.   Subjective: Speech is clear. He denies dyspnea but has occasional cough.   Objective: Vitals:   02/22/19 0000 02/22/19 0030 02/22/19 0103 02/22/19 0422  BP: (!) 114/51 122/61 (!) 122/59 130/67  Pulse: 64 65 67 64  Resp:   (!) 22 15  Temp:   98 F (36.7 C) 98 F (36.7 C)  TempSrc:   Oral Oral  SpO2:   93% 94%  Weight:   (!) 137.5 kg     Intake/Output Summary (Last 24 hours) at 02/22/2019 1809 Last data filed at 02/22/2019 9983 Gross per 24 hour  Intake 131 ml  Output 2000 ml  Net -  1869 ml   Filed Weights   02/21/19 2203 02/22/19 0103  Weight: (!) 139.5 kg (!) 137.5 kg    Examination:  General exam: NAD, sitting up in bed Respiratory system: No respiratory distress, no wheezing.  Cardiovascular system: S 1, S 2 RRR Gastrointestinal system: Soft, NT, ND Central nervous system: Speech is clear. Alert, oriented x3.  Extremities: Symmetric  power. Ecchymosis of left upper arm with no tenderness to palpation, no increase in warmth, no surrounding erythema.  Skin: no rashes   Data Reviewed: I have personally reviewed following labs and imaging studies  CBC: Recent Labs  Lab 02/19/19 1835 02/19/19 2043 02/20/19 0620 02/21/19 0326 02/21/19 2343 02/22/19 0644  WBC 4.8  --  4.4 5.7 6.1 5.8  NEUTROABS 3.7  --  3.5 4.8  --  4.3  HGB 11.5* 11.9* 11.2* 10.5* 10.8* 9.6*  HCT 35.5* 35.0* 35.0* 31.6* 33.3* 29.2*  MCV 99.2  --  100.6* 98.1 98.5 98.3  PLT 170  --  156 157 187 798   Basic Metabolic Panel: Recent Labs  Lab 02/19/19 1835 02/19/19 2043 02/20/19 0620 02/21/19 0326 02/21/19 1806 02/22/19 0644  NA 133* 129* 131* 132* 135 135  K 4.9 4.7 5.7* 5.9* 5.5* 4.9  CL 91* 92* 89* 90* 89* 90*  CO2 24  --  25 23 24 23   GLUCOSE 129* 98 132* 111* 114* 112*  BUN 30* 33* 43* 67* 85* 70*  CREATININE 8.70* 9.70* 10.73* 13.37* 14.64* 12.11*  CALCIUM 9.1  --  9.0 8.7* 9.2 9.0  MG  --   --  2.0  --  2.2  --   PHOS  --   --  7.5*  --   --   --    GFR: Estimated Creatinine Clearance: 8 mL/min (A) (by C-G formula based on SCr of 12.11 mg/dL (H)). Liver Function Tests: Recent Labs  Lab 02/19/19 1835 02/20/19 0620 02/21/19 0326 02/22/19 0644  AST 44* 43* 42* 41  ALT 47* 43 37 34  ALKPHOS 64 58 56 50  BILITOT 0.8 0.4 0.5 0.2*  PROT 8.2* 7.4 6.2* 6.9  ALBUMIN 3.3* 3.0* 2.8* 2.8*   No results for input(s): LIPASE, AMYLASE in the last 168 hours. No results for input(s): AMMONIA in the last 168 hours. Coagulation Profile: Recent Labs  Lab 02/19/19 1835  INR 1.0   Cardiac Enzymes: No results for input(s): CKTOTAL, CKMB, CKMBINDEX, TROPONINI in the last 168 hours. BNP (last 3 results) No results for input(s): PROBNP in the last 8760 hours. HbA1C: No results for input(s): HGBA1C in the last 72 hours. CBG: Recent Labs  Lab 02/19/19 1850  GLUCAP 106*   Lipid Profile: Recent Labs    02/19/19 1835  TRIG 135    Thyroid Function Tests: Recent Labs    02/19/19 2206  TSH 2.069   Anemia Panel: Recent Labs    02/19/19 2206 02/21/19 0326 02/22/19 0644  VITAMINB12 1,170*  --   --   FERRITIN  --  3,155* 2,632*   Sepsis Labs: Recent Labs  Lab 02/19/19 1835 02/19/19 2033 02/20/19 0620 02/21/19 0326  PROCALCITON 0.65  --  0.83 0.91  LATICACIDVEN 2.2* 1.2  --   --     Recent Results (from the past 240 hour(s))  Blood Culture (routine x 2)     Status: None (Preliminary result)   Collection Time: 02/19/19  8:33 PM   Specimen: BLOOD RIGHT ARM  Result Value Ref Range Status   Specimen Description BLOOD RIGHT  ARM  Final   Special Requests   Final    BOTTLES DRAWN AEROBIC AND ANAEROBIC Blood Culture adequate volume   Culture   Final    NO GROWTH 3 DAYS Performed at [facility], [address], [place],  [address]    Report Status PENDING  Incomplete         Radiology Studies: No results found.      Scheduled Meds: . Chlorhexidine Gluconate Cloth  6 each Topical Q0600  . Chlorhexidine Gluconate Cloth  6 each Topical Q0600  . [START ON 02/23/2019] darbepoetin (ARANESP) injection - DIALYSIS  60 mcg Intravenous Q Thu-HD  . dexamethasone (DECADRON) injection  6 mg Intravenous Q24H  . febuxostat  20 mg Oral Daily  . gabapentin  300 mg Oral QPM  . heparin  7,500 Units Subcutaneous Q8H  . heparin  8,000 Units Dialysis Once in dialysis  . multivitamin  1 tablet Oral QPM  . sevelamer carbonate  2,400 mg Oral TID WC  . sodium chloride flush  3 mL Intravenous Q12H   Continuous Infusions: . sodium chloride    . sodium chloride    . azithromycin 500 mg (02/22/19 0204)  . cefTRIAXone (ROCEPHIN)  IV 2 g (02/22/19 0407)  . remdesivir 100 mg in NS 100 mL 100 mg (02/22/19 1058)     LOS: 2 days    Time spent: 35 minutes    Blain Pais, MD [facility]   If 7PM-7AM, please contact night-coverage [email or web address] Password [facility] 02/22/2019, 6:09 PM

## 2019-02-23 LAB — COMPREHENSIVE METABOLIC PANEL
ALT: 35 U/L (ref 0–44)
AST: 40 U/L (ref 15–41)
Albumin: 2.7 g/dL — ABNORMAL LOW (ref 3.5–5.0)
Alkaline Phosphatase: 51 U/L (ref 38–126)
Anion gap: 25 — ABNORMAL HIGH (ref 5–15)
BUN: 99 mg/dL — ABNORMAL HIGH (ref 8–23)
CO2: 19 mmol/L — ABNORMAL LOW (ref 22–32)
Calcium: 8.5 mg/dL — ABNORMAL LOW (ref 8.9–10.3)
Chloride: 91 mmol/L — ABNORMAL LOW (ref 98–111)
Creatinine, Ser: 14.03 mg/dL — ABNORMAL HIGH (ref 0.61–1.24)
GFR calc Af Amer: 4 mL/min — ABNORMAL LOW (ref >60)
GFR calc non Af Amer: 3 mL/min — ABNORMAL LOW (ref >60)
Glucose, Bld: 99 mg/dL (ref 70–99)
Potassium: 5.9 mmol/L — ABNORMAL HIGH (ref 3.5–5.1)
Sodium: 135 mmol/L (ref 135–145)
Total Bilirubin: 0.4 mg/dL (ref 0.3–1.2)
Total Protein: 6.6 g/dL (ref 6.5–8.1)

## 2019-02-23 LAB — D-DIMER, QUANTITATIVE: D-Dimer, Quant: 1.66 ug/mL-FEU — ABNORMAL HIGH (ref 0.00–0.50)

## 2019-02-23 LAB — CBC WITH DIFFERENTIAL/PLATELET
Abs Immature Granulocytes: 0.02 10*3/uL (ref 0.00–0.07)
Basophils Absolute: 0 10*3/uL (ref 0.0–0.1)
Basophils Relative: 0 %
Eosinophils Absolute: 0 10*3/uL (ref 0.0–0.5)
Eosinophils Relative: 0 %
HCT: 29.6 % — ABNORMAL LOW (ref 39.0–52.0)
Hemoglobin: 9.6 g/dL — ABNORMAL LOW (ref 13.0–17.0)
Immature Granulocytes: 0 %
Lymphocytes Relative: 14 %
Lymphs Abs: 0.7 10*3/uL (ref 0.7–4.0)
MCH: 31.7 pg (ref 26.0–34.0)
MCHC: 32.4 g/dL (ref 30.0–36.0)
MCV: 97.7 fL (ref 80.0–100.0)
Monocytes Absolute: 0.5 10*3/uL (ref 0.1–1.0)
Monocytes Relative: 9 %
Neutro Abs: 4.1 10*3/uL (ref 1.7–7.7)
Neutrophils Relative %: 77 %
Platelets: 181 10*3/uL (ref 150–400)
RBC: 3.03 MIL/uL — ABNORMAL LOW (ref 4.22–5.81)
RDW: 13.2 % (ref 11.5–15.5)
WBC: 5.3 10*3/uL (ref 4.0–10.5)
nRBC: 0 % (ref 0.0–0.2)

## 2019-02-23 LAB — C-REACTIVE PROTEIN: CRP: 6.5 mg/dL — ABNORMAL HIGH (ref <1.0)

## 2019-02-23 LAB — FERRITIN: Ferritin: 2134 ng/mL — ABNORMAL HIGH (ref 24–336)

## 2019-02-23 MED ORDER — MIDODRINE HCL 5 MG PO TABS
10.0000 mg | ORAL_TABLET | ORAL | Status: DC
  Administered 2019-02-23: 12:00:00 10 mg via ORAL
  Filled 2019-02-23: qty 2

## 2019-02-23 MED ORDER — MIDODRINE HCL 5 MG PO TABS: 10.0000 mg | ORAL_TABLET | ORAL | Status: DC

## 2019-02-23 NOTE — Progress Notes (Signed)
PROGRESS NOTE    Richard Bush  RWE:[id number] DOB: 03-06-1950 DOA: 02/19/2019 PCP: Quentin Cornwall, MD   Brief Narrative: 69 year old with past medical history significant for ESRD on dialysis, gout, hypertension, neuropathy and recent COVID-19 diagnosis who presents to the emergency department referred from his dialysis center for evaluation of confusion speech difficulty, gait difficulty and possible left-sided weakness.  He has been having mild cough, patient drove himself to the dialysis center, seems to be normal on arrival and at around 1:30 PM when preparing to leave the dialysis center there was concern for aphasia of balance and confusion. On arrival to the ED patient was febrile 39 Celsius, oxygen sat 89 on room air, tachycardic, chest x-ray cardiomegaly with no acute findings.  CTA head and neck negative for large or medium vessel occlusion.  Lactic acid 2.2.  There was concern the patient may have left arm drift and code a stroke was activated.  Neurology evaluated the patient and recommended MRI of the brain.  Assessment & Plan:   Principal Problem:   Acute encephalopathy Active Problems:   Idiopathic chronic gout without tophus   ESRD (end stage renal disease) (HCC)   COVID-19 virus infection   Acute respiratory failure with hypoxia (H[facility])   Pneumonia due to COVID-19 virus   1-Acute encephalopathy probably metabolic/toxic; Preseneds with dysarthria and left-sided weakness, altered mental status from hemodialysis. CT negative for acute finding.  CTA no large vessel occlusion.  Deficit has resolved in  the ED. Neurology recommended MRI brain which was negative for CVA His symptoms have resolved, his speech is clear This was likely due to COVID-19 viral illness.     2-Acute Hypoxic Respiratory Failure; New Oxygen requirement on presentation. Oxygen on RA 89. Was on 2-3 L but this has improved, now on RA.  On dexamethasone and Remdesivir.   Pro-calcitonin 0.65.  continue with  ceftriaxone and azithro.  Start Delsyum as needed for cough  3-ESRD;  on hemodialysis: Hyperkalemia. Potassium level again elevated, will have HD again.  Nephrology consulted. Continue HD per their recommendations.    4-Gout; Continue with urolic.  5-Transaminases;  Related to covid, follow trend.   6-Elevated D dimer; related to covid. Follow trend. On heparin for DVT prophylasix.    Chronic hypotension; continue midodrine with HD.   Morbid obesity; needs diet education.  Mild hyponatremia. Correction with HD   Estimated body mass index is 44 kg/m as calculated from the following:   Height as of 01/03/19: 5\' 10"  (1.778 m).   Weight as of this encounter: 139.1 kg.   DVT prophylaxis: Heparin  Code Status: full code Family Communication: care discussed with patient.  Disposition Plan:  Hyperkalemia need to remain in the hospital for treatment of the same. Consultants:   none  Procedures:   None  Antimicrobials:  Ceftriaxone Azithromycin Remdesivir.   Subjective: Speech is clear. His respiratory status has improved, no longer requiring oxygen.   Objective: Vitals:   02/22/19 2136 02/23/19 0451 02/23/19 1206 02/23/19 1530  BP: 139/75 (!) 97/45 (!) 131/57 113/86  Pulse: 64 60 66 (!) 58  Resp: 19 16 13  (!) 23  Temp: 98.7 F (37.1 C) 98.1 F (36.7 C) 98.7 F (37.1 C) 98.4 F (36.9 C)  TempSrc: Oral Oral Oral Oral  SpO2: 97% 94% 92% 94%  Weight:    (!) 139.1 kg    Intake/Output Summary (Last 24 hours) at 02/23/2019 1629 Last data filed at 02/23/2019 1210 Gross per 24 hour  Intake 880 ml  Output -  Net 880 ml   Filed Weights   02/21/19 2203 02/22/19 0103 02/23/19 1530  Weight: (!) 139.5 kg (!) 137.5 kg (!) 139.1 kg    Examination:  General exam: NAD, sitting up in chair Respiratory system: No respiratory distress, no wheezing.  Cardiovascular system: S 1, S 2 RRR Gastrointestinal system: Soft, NT, ND Central nervous system: Speech is clear. Alert,  oriented x3.  Extremities: Symmetric power. Ecchymosis of left upper arm with no tenderness to palpation, no increase in warmth, no surrounding erythema.  Skin: no rashes   Data Reviewed: I have personally reviewed following labs and imaging studies  CBC: Recent Labs  Lab 02/19/19 1835 02/20/19 0620 02/21/19 0326 02/21/19 2343 02/22/19 0644 02/23/19 0342  WBC 4.8 4.4 5.7 6.1 5.8 5.3  NEUTROABS 3.7 3.5 4.8  --  4.3 4.1  HGB 11.5* 11.2* 10.5* 10.8* 9.6* 9.6*  HCT 35.5* 35.0* 31.6* 33.3* 29.2* 29.6*  MCV 99.2 100.6* 98.1 98.5 98.3 97.7  PLT 170 156 157 187 177 443   Basic Metabolic Panel: Recent Labs  Lab 02/20/19 0620 02/21/19 0326 02/21/19 1806 02/22/19 0644 02/23/19 0342  NA 131* 132* 135 135 135  K 5.7* 5.9* 5.5* 4.9 5.9*  CL 89* 90* 89* 90* 91*  CO2 25 23 24 23  19*  GLUCOSE 132* 111* 114* 112* 99  BUN 43* 67* 85* 70* 99*  CREATININE 10.73* 13.37* 14.64* 12.11* 14.03*  CALCIUM 9.0 8.7* 9.2 9.0 8.5*  MG 2.0  --  2.2  --   --   PHOS 7.5*  --   --   --   --    GFR: Estimated Creatinine Clearance: 7 mL/min (A) (by C-G formula based on SCr of 14.03 mg/dL (H)). Liver Function Tests: Recent Labs  Lab 02/19/19 1835 02/20/19 0620 02/21/19 0326 02/22/19 0644 02/23/19 0342  AST 44* 43* 42* 41 40  ALT 47* 43 37 34 35  ALKPHOS 64 58 56 50 51  BILITOT 0.8 0.4 0.5 0.2* 0.4  PROT 8.2* 7.4 6.2* 6.9 6.6  ALBUMIN 3.3* 3.0* 2.8* 2.8* 2.7*   No results for input(s): LIPASE, AMYLASE in the last 168 hours. No results for input(s): AMMONIA in the last 168 hours. Coagulation Profile: Recent Labs  Lab 02/19/19 1835  INR 1.0   Cardiac Enzymes: No results for input(s): CKTOTAL, CKMB, CKMBINDEX, TROPONINI in the last 168 hours. BNP (last 3 results) No results for input(s): PROBNP in the last 8760 hours. HbA1C: No results for input(s): HGBA1C in the last 72 hours. CBG: Recent Labs  Lab 02/19/19 1850  GLUCAP 106*   Lipid Profile: No results for input(s): CHOL, HDL,  LDLCALC, TRIG, CHOLHDL, LDLDIRECT in the last 72 hours. Thyroid Function Tests: No results for input(s): TSH, T4TOTAL, FREET4, T3FREE, THYROIDAB in the last 72 hours. Anemia Panel: Recent Labs    02/22/19 0644 02/23/19 0342  FERRITIN 2,632* 2,134*   Sepsis Labs: Recent Labs  Lab 02/19/19 1835 02/19/19 2033 02/20/19 0620 02/21/19 0326  PROCALCITON 0.65  --  0.83 0.91  LATICACIDVEN 2.2* 1.2  --   --     Recent Results (from the past 240 hour(s))  Blood Culture (routine x 2)     Status: None (Preliminary result)   Collection Time: 02/19/19  8:33 PM   Specimen: BLOOD RIGHT ARM  Result Value Ref Range Status   Specimen Description BLOOD RIGHT ARM  Final   Special Requests   Final    BOTTLES DRAWN AEROBIC AND ANAEROBIC Blood Culture  adequate volume   Culture   Final    NO GROWTH 4 DAYS Performed at [facility], [place] [address] [place], [place] [address]    Report Status PENDING  Incomplete         Radiology Studies: MR BRAIN WO CONTRAST  Result Date: 02/22/2019 CLINICAL DATA:  Encephalopathy EXAM: MRI HEAD WITHOUT CONTRAST TECHNIQUE: Multiplanar, multiecho pulse sequences of the brain and surrounding structures were obtained without intravenous contrast. COMPARISON:  None. FINDINGS: The examination is degraded by motion. Additionally, multiple sequences could not be acquired because of the degree of motion. There are 6 series provided for interpretation. There is no acute infarct. No midline shift or other mass effect. There is mild generalized atrophy. IMPRESSION: Truncated and motion degraded examination.  No acute infarct. Electronically Signed   By: Ulyses Jarred M.D.   On: 02/22/2019 23:45        Scheduled Meds: . Chlorhexidine Gluconate Cloth  6 each Topical Q0600  . Chlorhexidine Gluconate Cloth  6 each Topical Q0600  . darbepoetin (ARANESP) injection - DIALYSIS  60 mcg Intravenous Q Thu-HD  . dexamethasone (DECADRON) injection  6 mg Intravenous Q24H  .  febuxostat  20 mg Oral Daily  . gabapentin  300 mg Oral QPM  . heparin  7,500 Units Subcutaneous Q8H  . heparin  8,000 Units Dialysis Once in dialysis  . midodrine  10 mg Oral Q T,Th,Sa-HD  . multivitamin  1 tablet Oral QPM  . sevelamer carbonate  2,400 mg Oral TID WC  . sodium chloride flush  3 mL Intravenous Q12H   Continuous Infusions: . sodium chloride    . sodium chloride    . azithromycin 500 mg (02/23/19 0007)  . cefTRIAXone (ROCEPHIN)  IV 2 g (02/23/19 0140)     LOS: 3 days    Time spent: 25 minutes    Blain Pais, MD [facility]   If 7PM-7AM, please contact night-coverage [email or web address] Password [facility] 02/23/2019, 4:29 PM

## 2019-02-23 NOTE — Progress Notes (Signed)
[facility] NEPHROLOGY PROGRESS NOTE  Assessment/ Plan: Pt is a 69 y.o. yo male with HTN, gout, ESRD on HD MWF admitted with altered mental status and COVID-19 positive.   Dialysis Orders:  MWF - [facility]  4.25hrs, BFR 425, DFR 800,  EDW 141.5kg, 2K/ 2.25Ca. Access: LU AVF  Heparin 8000 unit bolus Mircera 30 mcg q4wks - last 01/25/19 Calcitriol 1.5 mcg PO qHD  #AMS/toxic metabolic encephalopathy: Seen by neurology.  Imaging studies with no acute stroke.  Mental status improved per nursing  #COVID-19 pneumonia: s/p remdesivir on steroids, Rocephin and azithromycin.  As per primary team.  # ESRD: TTS at [facility]  HD today per his new TTS schedule. 2 K bath.  # Anemia of CKD: on aranesp  # Secondary hyperparathyroidism: High phosphorus level.  On Renvela, resumed.  # HTN/volume: appears normally on midodrine pre-HD per one home med list. Ordered for now.  Subjective:  Due to the nature of this patient's TIRWE-31 with isolation and in keeping with efforts to prevent the spread of infection and to conserve personal protective equipment, a review of systems was not personally performed.  Patient's symptoms were discussed in detail with the RN and chart was reviewed.  He has been on 2 liters of oxygen - didn't get HD yesterday and is on schedule for today.  He wanted to try without oxygen but dropped to the high 80's on room air.  Objective Vital signs in last 24 hours: Vitals:   02/22/19 0103 02/22/19 0422 02/22/19 2136 02/23/19 0451  BP: (!) 122/59 130/67 139/75 (!) 97/45  Pulse: 67 64 64 60  Resp: (!) 22 15 19 16   Temp: 98 F (36.7 C) 98 F (36.7 C) 98.7 F (37.1 C) 98.1 F (36.7 C)  TempSrc: Oral Oral Oral Oral  SpO2: 93% 94% 97% 94%  Weight: (!) 137.5 kg      Weight change:   Intake/Output Summary (Last 24 hours) at 02/23/2019 1106 Last data filed at 02/23/2019 1026 Gross per 24 hour  Intake 760 ml  Output --  Net 760 ml    Physical exam per  nursing  General adult male in no acute distress Lungs clear lungs but diminished at bases; on 2 liters oxygen Extremities nonpitting edema  Neuro - alert and oriented x 4  Access LUE AVF bruit and thrill    Labs: Basic Metabolic Panel: Recent Labs  Lab 02/20/19 0620 02/21/19 1806 02/22/19 0644 02/23/19 0342  NA 131* 135 135 135  K 5.7* 5.5* 4.9 5.9*  CL 89* 89* 90* 91*  CO2 25 24 23  19*  GLUCOSE 132* 114* 112* 99  BUN 43* 85* 70* 99*  CREATININE 10.73* 14.64* 12.11* 14.03*  CALCIUM 9.0 9.2 9.0 8.5*  PHOS 7.5*  --   --   --    Liver Function Tests: Recent Labs  Lab 02/21/19 0326 02/22/19 0644 02/23/19 0342  AST 42* 41 40  ALT 37 34 35  ALKPHOS 56 50 51  BILITOT 0.5 0.2* 0.4  PROT 6.2* 6.9 6.6  ALBUMIN 2.8* 2.8* 2.7*   No results for input(s): LIPASE, AMYLASE in the last 168 hours. No results for input(s): AMMONIA in the last 168 hours. CBC: Recent Labs  Lab 02/20/19 0620 02/21/19 0326 02/21/19 2343 02/22/19 0644 02/23/19 0342  WBC 4.4 5.7 6.1 5.8 5.3  NEUTROABS 3.5 4.8  --  4.3 4.1  HGB 11.2* 10.5* 10.8* 9.6* 9.6*  HCT 35.0* 31.6* 33.3* 29.2* 29.6*  MCV 100.6* 98.1  98.5 98.3 97.7  PLT 156 157 187 177 181   Cardiac Enzymes: No results for input(s): CKTOTAL, CKMB, CKMBINDEX, TROPONINI in the last 168 hours. CBG: Recent Labs  Lab 02/19/19 1850  GLUCAP 106*    Iron Studies:  Recent Labs    02/23/19 0342  FERRITIN 2,134*   Studies/Results: MR BRAIN WO CONTRAST  Result Date: 02/22/2019 CLINICAL DATA:  Encephalopathy EXAM: MRI HEAD WITHOUT CONTRAST TECHNIQUE: Multiplanar, multiecho pulse sequences of the brain and surrounding structures were obtained without intravenous contrast. COMPARISON:  None. FINDINGS: The examination is degraded by motion. Additionally, multiple sequences could not be acquired because of the degree of motion. There are 6 series provided for interpretation. There is no acute infarct. No midline shift or other mass effect. There  is mild generalized atrophy. IMPRESSION: Truncated and motion degraded examination.  No acute infarct. Electronically Signed   By: Ulyses Jarred M.D.   On: 02/22/2019 23:45    Medications: Infusions: . sodium chloride    . sodium chloride    . azithromycin 500 mg (02/23/19 0007)  . cefTRIAXone (ROCEPHIN)  IV 2 g (02/23/19 0140)    Scheduled Medications: . Chlorhexidine Gluconate Cloth  6 each Topical Q0600  . Chlorhexidine Gluconate Cloth  6 each Topical Q0600  . darbepoetin (ARANESP) injection - DIALYSIS  60 mcg Intravenous Q Thu-HD  . dexamethasone (DECADRON) injection  6 mg Intravenous Q24H  . febuxostat  20 mg Oral Daily  . gabapentin  300 mg Oral QPM  . heparin  7,500 Units Subcutaneous Q8H  . heparin  8,000 Units Dialysis Once in dialysis  . [START ON 02/24/2019] midodrine  10 mg Oral Q M,W,F-HD  . multivitamin  1 tablet Oral QPM  . sevelamer carbonate  2,400 mg Oral TID WC  . sodium chloride flush  3 mL Intravenous Q12H    have reviewed scheduled and prn medications.  Claudia Desanctis 02/23/2019,11:06 AM  LOS: 3 days

## 2019-02-23 NOTE — Progress Notes (Signed)
Patient going to dialysis. Alert and oriented x 4. No acute distress noted, no complaints.

## 2019-02-23 NOTE — Progress Notes (Signed)
Patient came back from dialysis during the shift change. Alert and oriented x 4; no acute distress noted, no complaints. VS stable. Will continue to monitor.

## 2019-02-24 LAB — CBC WITH DIFFERENTIAL/PLATELET
Abs Immature Granulocytes: 0.04 10*3/uL (ref 0.00–0.07)
Basophils Absolute: 0 10*3/uL (ref 0.0–0.1)
Basophils Relative: 0 %
Eosinophils Absolute: 0 10*3/uL (ref 0.0–0.5)
Eosinophils Relative: 0 %
HCT: 30.9 % — ABNORMAL LOW (ref 39.0–52.0)
Hemoglobin: 10.4 g/dL — ABNORMAL LOW (ref 13.0–17.0)
Immature Granulocytes: 1 %
Lymphocytes Relative: 19 %
Lymphs Abs: 1 10*3/uL (ref 0.7–4.0)
MCH: 31.8 pg (ref 26.0–34.0)
MCHC: 33.7 g/dL (ref 30.0–36.0)
MCV: 94.5 fL (ref 80.0–100.0)
Monocytes Absolute: 0.6 10*3/uL (ref 0.1–1.0)
Monocytes Relative: 10 %
Neutro Abs: 3.8 10*3/uL (ref 1.7–7.7)
Neutrophils Relative %: 70 %
Platelets: 207 10*3/uL (ref 150–400)
RBC: 3.27 MIL/uL — ABNORMAL LOW (ref 4.22–5.81)
RDW: 12.9 % (ref 11.5–15.5)
WBC: 5.5 10*3/uL (ref 4.0–10.5)
nRBC: 0 % (ref 0.0–0.2)

## 2019-02-24 LAB — COMPREHENSIVE METABOLIC PANEL
ALT: 72 U/L — ABNORMAL HIGH (ref 0–44)
AST: 91 U/L — ABNORMAL HIGH (ref 15–41)
Albumin: 2.9 g/dL — ABNORMAL LOW (ref 3.5–5.0)
Alkaline Phosphatase: 61 U/L (ref 38–126)
Anion gap: 23 — ABNORMAL HIGH (ref 5–15)
BUN: 76 mg/dL — ABNORMAL HIGH (ref 8–23)
CO2: 22 mmol/L (ref 22–32)
Calcium: 8.5 mg/dL — ABNORMAL LOW (ref 8.9–10.3)
Chloride: 87 mmol/L — ABNORMAL LOW (ref 98–111)
Creatinine, Ser: 11.05 mg/dL — ABNORMAL HIGH (ref 0.61–1.24)
GFR calc Af Amer: 5 mL/min — ABNORMAL LOW (ref >60)
GFR calc non Af Amer: 4 mL/min — ABNORMAL LOW (ref >60)
Glucose, Bld: 89 mg/dL (ref 70–99)
Potassium: 4.9 mmol/L (ref 3.5–5.1)
Sodium: 132 mmol/L — ABNORMAL LOW (ref 135–145)
Total Bilirubin: 0.4 mg/dL (ref 0.3–1.2)
Total Protein: 7 g/dL (ref 6.5–8.1)

## 2019-02-24 LAB — CULTURE, BLOOD (ROUTINE X 2)
Culture: NO GROWTH
Special Requests: ADEQUATE

## 2019-02-24 LAB — FERRITIN: Ferritin: 3036 ng/mL — ABNORMAL HIGH (ref 24–336)

## 2019-02-24 LAB — D-DIMER, QUANTITATIVE: D-Dimer, Quant: 1.66 ug/mL-FEU — ABNORMAL HIGH (ref 0.00–0.50)

## 2019-02-24 LAB — C-REACTIVE PROTEIN: CRP: 4.7 mg/dL — ABNORMAL HIGH (ref <1.0)

## 2019-02-24 MED ORDER — DEXTROMETHORPHAN POLISTIREX ER 30 MG/5ML PO SUER: 30.0000 mg | Freq: Two times a day (BID) | ORAL | 0 refills | Status: DC | PRN

## 2019-02-24 MED ORDER — DEXAMETHASONE 6 MG PO TABS: 6.0000 mg | ORAL_TABLET | Freq: Every day | ORAL | 0 refills | Status: DC

## 2019-02-24 NOTE — Care Management Important Message (Signed)
Important Message  Patient Details  Name: Tykwon Fera MRN: [id number] Date of Birth: August 29, 1950   Medicare Important Message Given:  Yes - Important Message mailed due to current National Emergency  Verbal consent obtained due to current National Emergency  Relationship to patient: Self Contact Name: Donney Caraveo Call Date: 02/24/19  Time: 1530 Phone: [phone] Outcome: Spoke with contact Important Message mailed to: Patient address on file    Delorse Lek 02/24/2019, 3:31 PM

## 2019-02-24 NOTE — Progress Notes (Addendum)
Patient was discharged home by MD order; discharged instructions review and give to patient with care notes; IV DIC; skin intact; patient will be escorted to the car by nurse tech via wheelchair. CM able to talk with patient on the phone before he left the hospital.

## 2019-02-24 NOTE — Evaluation (Signed)
Physical Therapy Evaluation Patient Details Name: Richard Bush MRN: [id number] DOB: [birth year] Today's Date: 02/24/2019   History of Present Illness  69 yo male admitted COVID + and AMS MRI (-) PMH HTN Gout ESRD MWF HTN morbid obesity. Presented to ED from his dialysis center for evaluation of confusion, speech difficulty, gait difficulty, and possible left-sided weakness 02/19/19 CT negative for acute finding, MRI pending  Clinical Impression  PTA pt living with wife in multistory home with 1 step to enter. Pt was completely independent, driving himself to and from HD. Pt is currently limited in safe mobility decreased safety awareness, in presence of decreased balance and endurance. Pt is supervision for transfers and min guard for ambulation of 250 feet with slightly unsteady gait. PT recommending HHPT at d/c to work on balance. PT will continue to follow acutely.    Follow Up Recommendations Home health PT;Supervision for mobility/OOB    Equipment Recommendations  None recommended by PT    Recommendations for Other Services       Precautions / Restrictions Precautions Precautions: Fall Restrictions Weight Bearing Restrictions: No      Mobility  Bed Mobility               General bed mobility comments: up in chair  Transfers Overall transfer level: Needs assistance   Transfers: Sit to/from Stand Sit to Stand: Supervision         General transfer comment: supervision for safety from recliner  Ambulation/Gait Ambulation/Gait assistance: Min guard Gait Distance (Feet): 250 Feet Assistive device: None Gait Pattern/deviations: Step-through pattern;Drifts right/left Gait velocity: slowed Gait velocity interpretation: 1.31 - 2.62 ft/sec, indicative of limited community ambulator General Gait Details: hands on min guard for safety with slightly unsteady waddling gait, no overt LoB but drifting with gait        Balance Overall balance assessment: Needs  assistance Sitting-balance support: Feet supported Sitting balance-Leahy Scale: Good     Standing balance support: No upper extremity supported;During functional activity Standing balance-Leahy Scale: Fair Standing balance comment: deficits with dynamic standing balance and with longer distances of functional mobility. X1 instance of self corrected LOB                             Pertinent Vitals/Pain Pain Assessment: No/denies pain    Home Living Family/patient expects to be discharged to:: Private residence Living Arrangements: Spouse/significant other Available Help at Discharge: Family;Friend(s);Available PRN/intermittently(wife also with COVID) Type of Home: House Home Access: Stairs to enter   [employer] of Steps: 1 Home Layout: Multi-level;Able to live on main level with bedroom/bathroom Home Equipment: Gilford Rile - 2 wheels;Bedside commode Additional Comments: bariatric RW    Prior Function Level of Independence: Independent         Comments: driving self to dialysis, no AD     Hand Dominance        Extremity/Trunk Assessment   Upper Extremity Assessment Upper Extremity Assessment: Defer to OT evaluation    Lower Extremity Assessment Lower Extremity Assessment: Overall WFL for tasks assessed       Communication   Communication: No difficulties  Cognition Arousal/Alertness: Awake/alert Behavior During Therapy: WFL for tasks assessed/performed Overall Cognitive Status: Impaired/Different from baseline Area of Impairment: Memory;Safety/judgement;Problem solving                     Memory: Decreased short-term memory(does not recall the weakness, what happened pon admin)   Safety/Judgement: Decreased awareness of  deficits   Problem Solving: Slow processing General Comments: tangential      General Comments General comments (skin integrity, edema, etc.): Pt on RA with SaO2 96%O2 in sitting, difficulty with obtaining good  O2 readings with ambulation 88%O2 at one point, with sitting in room SaO2 found to be 96%O2        Assessment/Plan    PT Assessment Patient needs continued PT services  PT Problem List Decreased balance;Decreased activity tolerance;Cardiopulmonary status limiting activity       PT Treatment Interventions Gait training;Stair training;Functional mobility training;Therapeutic activities;Therapeutic exercise;Balance training;Cognitive remediation;Patient/family education    PT Goals (Current goals can be found in the Care Plan section)  Acute Rehab PT Goals Patient Stated Goal: go home PT Goal Formulation: With patient Time For Goal Achievement: 03/10/19 Potential to Achieve Goals: Good    Frequency Min 3X/week   Barriers to discharge        Co-evaluation PT/OT/SLP Co-Evaluation/Treatment: Yes Reason for Co-Treatment: For patient/therapist safety PT goals addressed during session: Mobility/safety with mobility OT goals addressed during session: ADL's and self-care;Proper use of Adaptive equipment and DME;Strengthening/ROM       AM-PAC PT "6 Clicks" Mobility  Outcome Measure Help needed turning from your back to your side while in a flat bed without using bedrails?: None Help needed moving from lying on your back to sitting on the side of a flat bed without using bedrails?: A Little Help needed moving to and from a bed to a chair (including a wheelchair)?: None Help needed standing up from a chair using your arms (e.g., wheelchair or bedside chair)?: None Help needed to walk in hospital room?: A Little Help needed climbing 3-5 steps with a railing? : A Lot 6 Click Score: 20    End of Session Equipment Utilized During Treatment: Gait belt Activity Tolerance: Patient tolerated treatment well Patient left: in chair;with call bell/phone within reach Nurse Communication: Mobility status PT Visit Diagnosis: Unsteadiness on feet (R26.81);Other abnormalities of gait and mobility  (R26.89);Difficulty in walking, not elsewhere classified (R26.2)    Time: 5456-2563 PT Time Calculation (min) (ACUTE ONLY): 34 min   Charges:   PT Evaluation $PT Eval Moderate Complexity: 1 Mod          Jaydrian Corpening B. Migdalia Dk PT, DPT Acute Rehabilitation Services Pager [phone] Office [phone]   [place] 02/24/2019, 1:35 PM

## 2019-02-24 NOTE — Discharge Summary (Signed)
Physician Discharge Summary  Kier Smead OIB:[id number] DOB: 03-26-1950 DOA: 02/19/2019  PCP: Quentin Cornwall, MD  Admit date: 02/19/2019 Discharge date: 02/24/2019  Admitted From: Home  Disposition: Home with [facility] PT  Recommendations for Outpatient Follow-up:  1. Follow up with PCP in 1-2 weeks 2. Please obtain BMP/CBC in one week  Home Health: Yes, PT Equipment/Devices: None      Discharge Condition: Stable  CODE STATUS: Full Diet recommendation: Renal diet  Brief/Interim Summary: 69 year old with past medical history significant for ESRD on dialysis, gout, hypertension, neuropathy and recent COVID-19 diagnosis who presents to the emergency department referred from his dialysis center for evaluation of confusion speech difficulty, gait difficulty and possible left-sided weakness.  He has been having mild cough, patient drove himself to the dialysis center, seems to be normal on arrival and at around 1:30 PM when preparing to leave the dialysis center there was concern for aphasia of balance and confusion. On arrival to the ED patient was febrile 39 Celsius, oxygen sat 89 on room air, tachycardic, chest x-ray cardiomegaly with no acute findings.  CTA head and neck negative for large or medium vessel occlusion.  Lactic acid 2.2.  There was concern the patient may have left arm drift and code a stroke was activated.  Neurology evaluated the patient and recommended MRI of the brain.  Discharge Diagnoses:  Principal Problem:   Acute encephalopathy Active Problems:   Idiopathic chronic gout without tophus   ESRD (end stage renal disease) ([place])   COVID-19 virus infection   Acute respiratory failure with hypoxia (HCC)   Pneumonia due to COVID-19 virus  1-Acute encephalopathy probably metabolic/toxic; Presented with dysarthria and left-sided weakness, altered mental status from hemodialysis. CT negative for acute finding.  CTA no large vessel occlusion.  Deficit resolved in  the ED. Neurology  recommended MRI brain which was negative for CVA.  His symptoms have resolved, his speech is clear. This was likely due to COVID-19 viral illness.     2-Acute Hypoxic Respiratory Failure; New Oxygen requirement on presentation. Oxygen on RA 89%. Was on 2-3 L but this has improved, now on RA with stable O2 sats in the mid 90s.  On dexamethasone for 4 more days after discharge, he completed treatment with Remdesivir.   Pro-calcitonin 0.65.  He completed treatment with ceftriaxone and azithro.  He will continue as needed for cough medication after his discharge.   3-ESRD;  on hemodialysis: Hyperkalemia. Potassium level was elevated a few times, improved with HD while inpatient.   Nephrology consulted. He is stable for discharge today and will continue his outpatieht HD on TTS.     4-Gout; Continue home urolic.  5-Transaminases;  Related to covid,improved.    6-Elevated D dimer; related to covid. Improved.   Chronic hypotension; continue midodrine with HD.   Morbid obesity; needs diet education.  Mild hyponatremia. Correction with HD   Estimated body mass index is 44 kg/m as calculated from the following:   Height as of 01/03/19: 5\' 10"  (1.778 m).   Weight as of this encounter: 139.1 kg.  Physical debility:  Due to acute illness. He was seen by PT while inpatient and had [facility] PT ordered at the time of dc.   Code Status: full code    Discharge Instructions  Discharge Instructions    Diet - low sodium heart healthy   Complete by: As directed    Face-to-face encounter (required for Medicare/Medicaid patients)   Complete by: As directed    I  Blain Pais certify that this patient is under my care and that I, or a nurse practitioner or physician's assistant working with me, had a face-to-face encounter that meets the physician face-to-face encounter requirements with this patient on 02/24/2019. The encounter with the patient was in whole, or in part for the following  medical condition(s) which is the primary reason for home health care (List medical condition): COVID-19 pneumonia, physical debility   The encounter with the patient was in whole, or in part, for the following medical condition, which is the primary reason for home health care: COVID pneumonia, physical debility   I certify that, based on my findings, the following services are medically necessary home health services: Physical therapy   Reason for Medically Necessary Home Health Services: Therapy- Therapeutic Exercises to Increase Strength and Endurance   My clinical findings support the need for the above services: Shortness of breath with activity   Further, I certify that my clinical findings support that this patient is homebound due to: Shortness of Breath with activity   Home Health   Complete by: As directed    To provide the following care/treatments: PT   Increase activity slowly   Complete by: As directed    MyChart COVID-19 home monitoring program   Complete by: Feb 24, 2019    Is the patient willing to use the [facility] for home monitoring?: No     Allergies as of 02/24/2019      Reactions   Cozaar [losartan Potassium] Other (See Comments)   Stopped due to kidney decline 4/18   Kayexalate [polystyrene] Other (See Comments)   Abnormal ekg   Altace [ramipril] Cough   Chronic cough   Other Swelling   "Symvisc"    Cholestyramine Light Other (See Comments)   UNSPECIFIED REACTION d   Sulfa Antibiotics Rash      Medication List    TAKE these medications   acetaminophen 650 MG CR tablet Commonly known as: TYLENOL Take 650 mg by mouth every 8 (eight) hours as needed for pain.   calcium acetate 667 MG capsule Commonly known as: PHOSLO Take 1 capsule (667 mg total) by mouth 3 (three) times daily with meals.   cetirizine 10 MG tablet Commonly known as: ZYRTEC Take 10 mg by mouth at bedtime.   colchicine 0.6 MG tablet TAKE 1 TABLET BY MOUTH ONCE DAILY AS NEEDED  FOR GOUT FLARE UPS What changed: See the new instructions.   dexamethasone 6 MG tablet Commonly known as: DECADRON Take 1 tablet (6 mg total) by mouth daily. Start taking on: February 25, 2019   dextromethorphan 30 MG/5ML liquid Commonly known as: DELSYM Take 5 mLs (30 mg total) by mouth 2 (two) times daily as needed for cough.   docusate sodium 100 MG capsule Commonly known as: COLACE Take 100 mg by mouth daily.   DULoxetine 30 MG capsule Commonly known as: CYMBALTA Take 30 mg by mouth daily.   febuxostat 40 MG tablet Commonly known as: ULORIC Take 1/2 (one-half) tablet by mouth once daily What changed: See the new instructions.   folic acid-vitamin b complex-vitamin c-selenium-zinc 3 MG Tabs tablet Take 1 tablet by mouth every evening.   gabapentin 300 MG capsule Commonly known as: NEURONTIN Take 300 mg by mouth every evening.   ibuprofen 200 MG tablet Commonly known as: ADVIL Take 400 mg by mouth every 6 (six) hours as needed for mild pain.   midodrine 10 MG tablet Commonly known as: PROAMATINE Take  10 mg by mouth every Monday, Wednesday, and Friday. 30 minutes before dialysis   omeprazole 20 MG capsule Commonly known as: PRILOSEC Take 20 mg by mouth every morning.   sevelamer carbonate 800 MG tablet Commonly known as: RENVELA Take 2,400 mg by mouth 3 (three) times daily with meals.       Allergies  Allergen Reactions  . Cozaar [Losartan Potassium] Other (See Comments)    Stopped due to kidney decline 4/18  . Kayexalate [Polystyrene] Other (See Comments)    Abnormal ekg  . Altace [Ramipril] Cough    Chronic cough  . Other Swelling    "Symvisc"   . Cholestyramine Light Other (See Comments)    UNSPECIFIED REACTION d  . Sulfa Antibiotics Rash    Consultations:  Nephrology    Procedures/Studies: CT Code Stroke CTA Head W/WO contrast  Result Date: 02/19/2019 CLINICAL DATA:  Dialysis patient with acute mental status changes. EXAM: CT ANGIOGRAPHY  HEAD AND NECK CT PERFUSION BRAIN TECHNIQUE: Multidetector CT imaging of the head and neck was performed using the standard protocol during bolus administration of intravenous contrast. Multiplanar CT image reconstructions and MIPs were obtained to evaluate the vascular anatomy. Carotid stenosis measurements (when applicable) are obtained utilizing NASCET criteria, using the distal internal carotid diameter as the denominator. Multiphase CT imaging of the brain was performed following IV bolus contrast injection. Subsequent parametric perfusion maps were calculated using RAPID software. CONTRAST:  168mL OMNIPAQUE IOHEXOL 350 MG/ML SOLN COMPARISON:  Head CT earlier same day. FINDINGS: CTA NECK FINDINGS Aortic arch: Aortic atherosclerosis. No aneurysm or dissection. Branching pattern is normal. Right carotid system: Common carotid artery widely patent to the bifurcation. Mild atherosclerotic plaque at the ICA bulb but no stenosis. Cervical ICA widely patent beyond that. Left carotid system: Common carotid artery widely patent to the bifurcation. Mild atherosclerotic plaque at the bifurcation but no stenosis. Cervical ICA widely patent beyond that. Vertebral arteries: Both vertebral artery origins show some adjacent plaque but no stenosis. Left vertebral artery is dominant. Both vertebral arteries widely patent through the cervical region to the foramen magnum. Skeleton: Ordinary cervical spondylosis. Other neck: No mass or lymphadenopathy. Upper chest: Patchy pulmonary infiltrates consistent with pneumonia. Patient with recent coronavirus diagnosis. Review of the MIP images confirms the above findings CTA HEAD FINDINGS Anterior circulation: Both internal carotid arteries are patent through the skull base and siphon regions. Siphon atherosclerotic calcification but without stenosis greater than 30%. Anterior and middle cerebral vessels are patent without proximal stenosis, aneurysm or vascular malformation. No large or  medium vessel occlusion. Posterior circulation: Both vertebral arteries are widely patent to the basilar. No basilar stenosis. Posterior circulation branch vessels are patent and unremarkable. Venous sinuses: Patent and normal. Anatomic variants: None significant. Review of the MIP images confirms the above findings CT Brain Perfusion Findings: ASPECTS: 10 CBF (<30%) Volume: 35mL Perfusion (Tmax>6.0s) volume: 36mL reported by the program. I think this is artifactual and related to patient motion. This is bilateral and symmetric affecting the inferior cortex. Mismatch Volume: 58mL reported, but in actuality I do not believe there is an acute insult. Infarction Location:None IMPRESSION: No large or medium vessel occlusion. Ordinary mild atherosclerosis of the carotid bifurcations and carotid siphons but without flow limiting stenosis. Perfusion imaging does not show an infarction. I do not believe there is any true at risk brain. There is a good deal of motion during this examination, explaining the T-max data, which I believe is artifactual. Case discussed by telephone with Dr. Malen Gauze  at 1932 hours. Electronically Signed   By: Nelson Chimes M.D.   On: 02/19/2019 19:39   CT Code Stroke CTA Neck W/WO contrast  Result Date: 02/19/2019 CLINICAL DATA:  Dialysis patient with acute mental status changes. EXAM: CT ANGIOGRAPHY HEAD AND NECK CT PERFUSION BRAIN TECHNIQUE: Multidetector CT imaging of the head and neck was performed using the standard protocol during bolus administration of intravenous contrast. Multiplanar CT image reconstructions and MIPs were obtained to evaluate the vascular anatomy. Carotid stenosis measurements (when applicable) are obtained utilizing NASCET criteria, using the distal internal carotid diameter as the denominator. Multiphase CT imaging of the brain was performed following IV bolus contrast injection. Subsequent parametric perfusion maps were calculated using RAPID software. CONTRAST:  134mL  OMNIPAQUE IOHEXOL 350 MG/ML SOLN COMPARISON:  Head CT earlier same day. FINDINGS: CTA NECK FINDINGS Aortic arch: Aortic atherosclerosis. No aneurysm or dissection. Branching pattern is normal. Right carotid system: Common carotid artery widely patent to the bifurcation. Mild atherosclerotic plaque at the ICA bulb but no stenosis. Cervical ICA widely patent beyond that. Left carotid system: Common carotid artery widely patent to the bifurcation. Mild atherosclerotic plaque at the bifurcation but no stenosis. Cervical ICA widely patent beyond that. Vertebral arteries: Both vertebral artery origins show some adjacent plaque but no stenosis. Left vertebral artery is dominant. Both vertebral arteries widely patent through the cervical region to the foramen magnum. Skeleton: Ordinary cervical spondylosis. Other neck: No mass or lymphadenopathy. Upper chest: Patchy pulmonary infiltrates consistent with pneumonia. Patient with recent coronavirus diagnosis. Review of the MIP images confirms the above findings CTA HEAD FINDINGS Anterior circulation: Both internal carotid arteries are patent through the skull base and siphon regions. Siphon atherosclerotic calcification but without stenosis greater than 30%. Anterior and middle cerebral vessels are patent without proximal stenosis, aneurysm or vascular malformation. No large or medium vessel occlusion. Posterior circulation: Both vertebral arteries are widely patent to the basilar. No basilar stenosis. Posterior circulation branch vessels are patent and unremarkable. Venous sinuses: Patent and normal. Anatomic variants: None significant. Review of the MIP images confirms the above findings CT Brain Perfusion Findings: ASPECTS: 10 CBF (<30%) Volume: 30mL Perfusion (Tmax>6.0s) volume: 34mL reported by the program. I think this is artifactual and related to patient motion. This is bilateral and symmetric affecting the inferior cortex. Mismatch Volume: 15mL reported, but in  actuality I do not believe there is an acute insult. Infarction Location:None IMPRESSION: No large or medium vessel occlusion. Ordinary mild atherosclerosis of the carotid bifurcations and carotid siphons but without flow limiting stenosis. Perfusion imaging does not show an infarction. I do not believe there is any true at risk brain. There is a good deal of motion during this examination, explaining the T-max data, which I believe is artifactual. Case discussed by telephone with Dr. Malen Gauze at 1932 hours. Electronically Signed   By: Nelson Chimes M.D.   On: 02/19/2019 19:39   MR BRAIN WO CONTRAST  Result Date: 02/22/2019 CLINICAL DATA:  Encephalopathy EXAM: MRI HEAD WITHOUT CONTRAST TECHNIQUE: Multiplanar, multiecho pulse sequences of the brain and surrounding structures were obtained without intravenous contrast. COMPARISON:  None. FINDINGS: The examination is degraded by motion. Additionally, multiple sequences could not be acquired because of the degree of motion. There are 6 series provided for interpretation. There is no acute infarct. No midline shift or other mass effect. There is mild generalized atrophy. IMPRESSION: Truncated and motion degraded examination.  No acute infarct. Electronically Signed   By: Cletus Gash.D.  On: 02/22/2019 23:45   CT Code Stroke Cerebral Perfusion with contrast  Result Date: 02/19/2019 CLINICAL DATA:  Dialysis patient with acute mental status changes. EXAM: CT ANGIOGRAPHY HEAD AND NECK CT PERFUSION BRAIN TECHNIQUE: Multidetector CT imaging of the head and neck was performed using the standard protocol during bolus administration of intravenous contrast. Multiplanar CT image reconstructions and MIPs were obtained to evaluate the vascular anatomy. Carotid stenosis measurements (when applicable) are obtained utilizing NASCET criteria, using the distal internal carotid diameter as the denominator. Multiphase CT imaging of the brain was performed following IV bolus  contrast injection. Subsequent parametric perfusion maps were calculated using RAPID software. CONTRAST:  161mL OMNIPAQUE IOHEXOL 350 MG/ML SOLN COMPARISON:  Head CT earlier same day. FINDINGS: CTA NECK FINDINGS Aortic arch: Aortic atherosclerosis. No aneurysm or dissection. Branching pattern is normal. Right carotid system: Common carotid artery widely patent to the bifurcation. Mild atherosclerotic plaque at the ICA bulb but no stenosis. Cervical ICA widely patent beyond that. Left carotid system: Common carotid artery widely patent to the bifurcation. Mild atherosclerotic plaque at the bifurcation but no stenosis. Cervical ICA widely patent beyond that. Vertebral arteries: Both vertebral artery origins show some adjacent plaque but no stenosis. Left vertebral artery is dominant. Both vertebral arteries widely patent through the cervical region to the foramen magnum. Skeleton: Ordinary cervical spondylosis. Other neck: No mass or lymphadenopathy. Upper chest: Patchy pulmonary infiltrates consistent with pneumonia. Patient with recent coronavirus diagnosis. Review of the MIP images confirms the above findings CTA HEAD FINDINGS Anterior circulation: Both internal carotid arteries are patent through the skull base and siphon regions. Siphon atherosclerotic calcification but without stenosis greater than 30%. Anterior and middle cerebral vessels are patent without proximal stenosis, aneurysm or vascular malformation. No large or medium vessel occlusion. Posterior circulation: Both vertebral arteries are widely patent to the basilar. No basilar stenosis. Posterior circulation branch vessels are patent and unremarkable. Venous sinuses: Patent and normal. Anatomic variants: None significant. Review of the MIP images confirms the above findings CT Brain Perfusion Findings: ASPECTS: 10 CBF (<30%) Volume: 33mL Perfusion (Tmax>6.0s) volume: 69mL reported by the program. I think this is artifactual and related to patient  motion. This is bilateral and symmetric affecting the inferior cortex. Mismatch Volume: 32mL reported, but in actuality I do not believe there is an acute insult. Infarction Location:None IMPRESSION: No large or medium vessel occlusion. Ordinary mild atherosclerosis of the carotid bifurcations and carotid siphons but without flow limiting stenosis. Perfusion imaging does not show an infarction. I do not believe there is any true at risk brain. There is a good deal of motion during this examination, explaining the T-max data, which I believe is artifactual. Case discussed by telephone with Dr. Malen Gauze at 1932 hours. Electronically Signed   By: Nelson Chimes M.D.   On: 02/19/2019 19:39   DG Chest Port 1 View  Result Date: 02/19/2019 CLINICAL DATA:  Shortness of breath, dialysis, altered mental status EXAM: PORTABLE CHEST 1 VIEW COMPARISON:  08/04/2016 FINDINGS: Mild cardiomegaly. Both lungs are clear. The visualized skeletal structures are unremarkable. IMPRESSION: Mild cardiomegaly without acute abnormality of the lungs in AP portable projection. Electronically Signed   By: Ihsan Candle M.D.   On: 02/19/2019 20:33   CT HEAD CODE STROKE WO CONTRAST  Result Date: 02/19/2019 CLINICAL DATA:  Code stroke. Dialysis patient with expressive aphasia. EXAM: CT HEAD WITHOUT CONTRAST TECHNIQUE: Contiguous axial images were obtained from the base of the skull through the vertex without intravenous contrast. COMPARISON:  None. FINDINGS: Brain: The study suffers from motion degradation. Allowing for that, there is no evidence of old or acute infarction, mass lesion, hemorrhage, hydrocephalus or extra-axial collection. Vascular: There is atherosclerotic calcification of the major vessels at the base of the brain. Skull: Negative Sinuses/Orbits: Clear/normal Other: None ASPECTS ([place] Stroke Program Early CT Score) - Ganglionic level infarction (caudate, lentiform nuclei, internal capsule, insula, M1-M3 cortex): 7 -  Supraganglionic infarction (M4-M6 cortex): 3 Total score (0-10 with 10 being normal): 10 IMPRESSION: 1. Motion degraded but otherwise normal examination. 2. ASPECTS is 10. 3. These results were communicated to Dr. Rory Percy at [place] 1/3/2021by text page via the [facility] messaging system. Electronically Signed   By: Nelson Chimes M.D.   On: 02/19/2019 19:12       Subjective: Patient reports that his respiratory status is much improved. He feels weak but is motivated to work with PT.   Discharge Exam: Vitals:   02/24/19 0533 02/24/19 1235  BP: 130/72 (!) 119/51  Pulse: 71 69  Resp: 14   Temp: 98.5 F (36.9 C)   SpO2: 94% 100%   Vitals:   02/23/19 1922 02/23/19 2042 02/24/19 0533 02/24/19 1235  BP: 98/74 91/60 130/72 (!) 119/51  Pulse:  68 71 69  Resp: 15 16 14    Temp: 97.9 F (36.6 C) 98.5 F (36.9 C) 98.5 F (36.9 C)   TempSrc: Oral Oral Oral   SpO2: 93% 93% 94% 100%  Weight:        General: Pt is alert, awake, not in acute distress Cardiovascular: RRR, S1/S2  Respiratory: No respiratory distress Abdominal: Soft, NT, ND Extremities: no edema, no cyanosis    The results of significant diagnostics from this hospitalization (including imaging, microbiology, ancillary and laboratory) are listed below for reference.     Microbiology: Recent Results (from the past 240 hour(s))  Blood Culture (routine x 2)     Status: None   Collection Time: 02/19/19  8:33 PM   Specimen: BLOOD RIGHT ARM  Result Value Ref Range Status   Specimen Description BLOOD RIGHT ARM  Final   Special Requests   Final    BOTTLES DRAWN AEROBIC AND ANAEROBIC Blood Culture adequate volume   Culture   Final    NO GROWTH 5 DAYS Performed at [facility], [address] [place], [place] [address]    Report Status 02/24/2019 FINAL  Final     Labs: BNP (last 3 results) No results for input(s): BNP in the last 8760 hours. Basic Metabolic Panel: Recent Labs  Lab 02/20/19 0620 02/21/19 0326  02/21/19 1806 02/22/19 0644 02/23/19 0342 02/24/19 0500  NA 131* 132* 135 135 135 132*  K 5.7* 5.9* 5.5* 4.9 5.9* 4.9  CL 89* 90* 89* 90* 91* 87*  CO2 25 23 24 23  19* 22  GLUCOSE 132* 111* 114* 112* 99 89  BUN 43* 67* 85* 70* 99* 76*  CREATININE 10.73* 13.37* 14.64* 12.11* 14.03* 11.05*  CALCIUM 9.0 8.7* 9.2 9.0 8.5* 8.5*  MG 2.0  --  2.2  --   --   --   PHOS 7.5*  --   --   --   --   --    Liver Function Tests: Recent Labs  Lab 02/20/19 0620 02/21/19 0326 02/22/19 0644 02/23/19 0342 02/24/19 0500  AST 43* 42* 41 40 91*  ALT 43 37 34 35 72*  ALKPHOS 58 56 50 51 61  BILITOT 0.4 0.5 0.2* 0.4 0.4  PROT 7.4  6.2* 6.9 6.6 7.0  ALBUMIN 3.0* 2.8* 2.8* 2.7* 2.9*   No results for input(s): LIPASE, AMYLASE in the last 168 hours. No results for input(s): AMMONIA in the last 168 hours. CBC: Recent Labs  Lab 02/20/19 0620 02/21/19 0326 02/21/19 2343 02/22/19 0644 02/23/19 0342 02/24/19 0500  WBC 4.4 5.7 6.1 5.8 5.3 5.5  NEUTROABS 3.5 4.8  --  4.3 4.1 3.8  HGB 11.2* 10.5* 10.8* 9.6* 9.6* 10.4*  HCT 35.0* 31.6* 33.3* 29.2* 29.6* 30.9*  MCV 100.6* 98.1 98.5 98.3 97.7 94.5  PLT 156 157 187 177 181 207   Cardiac Enzymes: No results for input(s): CKTOTAL, CKMB, CKMBINDEX, TROPONINI in the last 168 hours. BNP: Invalid input(s): POCBNP CBG: Recent Labs  Lab 02/19/19 1850  GLUCAP 106*   D-Dimer Recent Labs    02/23/19 0342 02/24/19 0500  DDIMER 1.66* 1.66*   Hgb A1c No results for input(s): HGBA1C in the last 72 hours. Lipid Profile No results for input(s): CHOL, HDL, LDLCALC, TRIG, CHOLHDL, LDLDIRECT in the last 72 hours. Thyroid function studies No results for input(s): TSH, T4TOTAL, T3FREE, THYROIDAB in the last 72 hours.  Invalid input(s): FREET3 Anemia work up Recent Labs    02/23/19 0342 02/24/19 0500  FERRITIN 2,134* 3,036*   Urinalysis    Component Value Date/Time   COLORURINE STRAW (A) 08/03/2016 1007   APPEARANCEUR CLEAR 08/03/2016 1007    LABSPEC 1.009 08/03/2016 1007   PHURINE 5.0 08/03/2016 1007   GLUCOSEU NEGATIVE 08/03/2016 1007   HGBUR SMALL (A) 08/03/2016 1007   BILIRUBINUR NEGATIVE 08/03/2016 1007   KETONESUR NEGATIVE 08/03/2016 1007   PROTEINUR 100 (A) 08/03/2016 1007   UROBILINOGEN 0.2 07/18/2013 1248   NITRITE NEGATIVE 08/03/2016 1007   LEUKOCYTESUR NEGATIVE 08/03/2016 1007   Sepsis Labs Invalid input(s): PROCALCITONIN,  WBC,  LACTICIDVEN Microbiology Recent Results (from the past 240 hour(s))  Blood Culture (routine x 2)     Status: None   Collection Time: 02/19/19  8:33 PM   Specimen: BLOOD RIGHT ARM  Result Value Ref Range Status   Specimen Description BLOOD RIGHT ARM  Final   Special Requests   Final    BOTTLES DRAWN AEROBIC AND ANAEROBIC Blood Culture adequate volume   Culture   Final    NO GROWTH 5 DAYS Performed at [facility] Lab, [address], [place], [place] [address]    Report Status 02/24/2019 FINAL  Final     Time coordinating discharge: Over 33 minutes  SIGNED:   Blain Pais, MD  [facility] 02/24/2019, 3:24 PM   If 7PM-7AM, please contact night-coverage [email or web address] Password [email or web address]

## 2019-02-24 NOTE — Progress Notes (Addendum)
Patient with D/C order is waiting for his wife. She is driving from [place], [place].

## 2019-02-24 NOTE — Progress Notes (Signed)
[facility] NEPHROLOGY PROGRESS NOTE  Assessment/ Plan: Pt is a 69 y.o. yo male with HTN, gout, ESRD on HD MWF admitted with altered mental status and COVID-19 positive.   Dialysis Orders:  MWF - [facility]  4.25hrs, BFR 425, DFR 800,  EDW 141.5kg, 2K/ 2.25Ca. Access: LU AVF  Heparin 8000 unit bolus Mircera 30 mcg q4wks - last 01/25/19 Calcitriol 1.5 mcg PO qHD  #AMS/toxic metabolic encephalopathy: Seen by neurology.  Imaging studies with no acute stroke.  Mental status improved per nursing  #COVID-19 pneumonia: s/p remdesivir on steroids, Rocephin and azithromycin.  As per primary team.  # ESRD: Has been on a new TTS schedule here and will discharge with TTS schedule at [facility].  Pt states is aware  # Anemia of CKD: on aranesp  # Secondary hyperparathyroidism: hyperphos On Renvela, resumed.  # HTN/volume: on midodrine pre-HD per prior regimen  Will be going home.  Stable for discharge from a renal standpoint   Subjective:  Due to the nature of this patient's COVID-19 with isolation and in keeping with efforts to prevent the spread of infection and to conserve personal protective equipment, a review of systems was not personally performed.  Patient's symptoms were discussed in detail with the RN and chart was reviewed.  Spoke with nursing.  He was weaned to room air in the interim.  He is going home upon discharge.  He confirms that he does take midodrine at home before HD.  Thinks he is going home tomorrow per nursing.  Ros Ambulated hall with PT No shortness of breath or chest pain No n/v  Objective Vital signs in last 24 hours: Vitals:   02/23/19 1922 02/23/19 2042 02/24/19 0533 02/24/19 1235  BP: 98/74 91/60 130/72 (!) 119/51  Pulse:  68 71 69  Resp: 15 16 14    Temp: 97.9 F (36.6 C) 98.5 F (36.9 C) 98.5 F (36.9 C)   TempSrc: Oral Oral Oral   SpO2: 93% 93% 94% 100%  Weight:       Weight change:   Intake/Output Summary (Last 24  hours) at 02/24/2019 1429 Last data filed at 02/24/2019 1233 Gross per 24 hour  Intake 300 ml  Output 1475 ml  Net -1175 ml    Physical exam per nursing  General adult male in no acute distress Lungs clear lungs no crackles; on room air Extremities non pitting edema of feet Neuro - alert and oriented x 4  Access LUE AVF bruit and thrill    Labs: Basic Metabolic Panel: Recent Labs  Lab 02/20/19 0620 02/22/19 0644 02/23/19 0342 02/24/19 0500  NA 131* 135 135 132*  K 5.7* 4.9 5.9* 4.9  CL 89* 90* 91* 87*  CO2 25 23 19* 22  GLUCOSE 132* 112* 99 89  BUN 43* 70* 99* 76*  CREATININE 10.73* 12.11* 14.03* 11.05*  CALCIUM 9.0 9.0 8.5* 8.5*  PHOS 7.5*  --   --   --    Liver Function Tests: Recent Labs  Lab 02/22/19 0644 02/23/19 0342 02/24/19 0500  AST 41 40 91*  ALT 34 35 72*  ALKPHOS 50 51 61  BILITOT 0.2* 0.4 0.4  PROT 6.9 6.6 7.0  ALBUMIN 2.8* 2.7* 2.9*   No results for input(s): LIPASE, AMYLASE in the last 168 hours. No results for input(s): AMMONIA in the last 168 hours. CBC: Recent Labs  Lab 02/21/19 0326 02/21/19 2343 02/22/19 0644 02/23/19 0342 02/24/19 0500  WBC 5.7 6.1 5.8 5.3 5.5  NEUTROABS  4.8  --  4.3 4.1 3.8  HGB 10.5* 10.8* 9.6* 9.6* 10.4*  HCT 31.6* 33.3* 29.2* 29.6* 30.9*  MCV 98.1 98.5 98.3 97.7 94.5  PLT 157 187 177 181 207   Cardiac Enzymes: No results for input(s): CKTOTAL, CKMB, CKMBINDEX, TROPONINI in the last 168 hours. CBG: Recent Labs  Lab 02/19/19 1850  GLUCAP 106*    Iron Studies:  Recent Labs    02/24/19 0500  FERRITIN 3,036*   Studies/Results: MR BRAIN WO CONTRAST  Result Date: 02/22/2019 CLINICAL DATA:  Encephalopathy EXAM: MRI HEAD WITHOUT CONTRAST TECHNIQUE: Multiplanar, multiecho pulse sequences of the brain and surrounding structures were obtained without intravenous contrast. COMPARISON:  None. FINDINGS: The examination is degraded by motion. Additionally, multiple sequences could not be acquired because of the  degree of motion. There are 6 series provided for interpretation. There is no acute infarct. No midline shift or other mass effect. There is mild generalized atrophy. IMPRESSION: Truncated and motion degraded examination.  No acute infarct. Electronically Signed   By: Ulyses Jarred M.D.   On: 02/22/2019 23:45    Medications: Infusions: . sodium chloride    . sodium chloride      Scheduled Medications: . Chlorhexidine Gluconate Cloth  6 each Topical Q0600  . Chlorhexidine Gluconate Cloth  6 each Topical Q0600  . darbepoetin (ARANESP) injection - DIALYSIS  60 mcg Intravenous Q Thu-HD  . dexamethasone (DECADRON) injection  6 mg Intravenous Q24H  . febuxostat  20 mg Oral Daily  . gabapentin  300 mg Oral QPM  . heparin  7,500 Units Subcutaneous Q8H  . heparin  8,000 Units Dialysis Once in dialysis  . midodrine  10 mg Oral Q T,Th,Sa-HD  . multivitamin  1 tablet Oral QPM  . sevelamer carbonate  2,400 mg Oral TID WC  . sodium chloride flush  3 mL Intravenous Q12H    have reviewed scheduled and prn medications.  Marlane Hatcher Naheim Burgen 02/24/2019,2:29 PM  LOS: 4 days

## 2019-02-24 NOTE — Evaluation (Addendum)
Occupational Therapy Evaluation Patient Details Name: Richard Bush MRN: [id number] DOB: 1950-09-11 Today's Date: 02/24/2019    History of Present Illness 69 yo male admitted COVID + and AMS MRI (-) PMH HTN Gout ESRD MWF HTN morbid obesity. Presented to ED from his dialysis center for evaluation of confusion, speech difficulty, gait difficulty, and possible left-sided weakness 02/19/19 CT negative for acute finding, MRI pending   Clinical Impression   PTA pt living at home with spouse, independent with mobility, BADL, and community engagement. At time of eval, pt is supervision for transfers and min guard for functional mobility. He demonstrates the ability to physically complete BADL at mod I level, but needs education and cueing for ECS strategies to implement for endurance. After completing functional mobility in hall beyond level of functional mobility, pt SpO2 at 88%. Cues and education given on pursed lip breathing, which helped recover saturations. Pt continues to present with some lingering higher level cognitive deficits, such as decreased awareness of current deficits. Per chart review cognition has improved. Pt will benefit from acute OT to address BADL endurance and ECS strategies. No post acute OT f/u needed at this time. Will continue to follow per POC listed below while pt inpatient.     Follow Up Recommendations  No OT follow up;Supervision - Intermittent    Equipment Recommendations  None recommended by OT    Recommendations for Other Services       Precautions / Restrictions Precautions Precautions: Fall Restrictions Weight Bearing Restrictions: No      Mobility Bed Mobility               General bed mobility comments: up in chair  Transfers Overall transfer level: Needs assistance   Transfers: Sit to/from Stand Sit to Stand: Supervision         General transfer comment: supervision for safety from recliner    Balance Overall balance assessment: Needs  assistance Sitting-balance support: Feet supported Sitting balance-Leahy Scale: Good     Standing balance support: No upper extremity supported;During functional activity Standing balance-Leahy Scale: Fair Standing balance comment: deficits with dynamic standing balance and with longer distances of functional mobility. X1 instance of self corrected LOB                           ADL either performed or assessed with clinical judgement   ADL Overall ADL's : Modified independent                                       General ADL Comments: Pt demonstrates ability to complete BADL at mod I level. He presents with deficits in BADL/IADL that requires endurance and implementation of ECS strategies     Vision Baseline Vision/History: Wears glasses Wears Glasses: At all times Patient Visual Report: No change from baseline       Perception     Praxis      Pertinent Vitals/Pain Pain Assessment: No/denies pain     Hand Dominance     Extremity/Trunk Assessment Upper Extremity Assessment Upper Extremity Assessment: Overall WFL for tasks assessed   Lower Extremity Assessment Lower Extremity Assessment: Defer to PT evaluation       Communication Communication Communication: No difficulties   Cognition Arousal/Alertness: Awake/alert Behavior During Therapy: WFL for tasks assessed/performed Overall Cognitive Status: Impaired/Different from baseline Area of Impairment: Memory;Safety/judgement;Problem solving  Memory: Decreased short-term memory(does not recall the weakness, what happened pon admin)   Safety/Judgement: Decreased awareness of deficits   Problem Solving: Slow processing General Comments: tangential   General Comments       Exercises     Shoulder Instructions      Home Living Family/patient expects to be discharged to:: Private residence Living Arrangements: Spouse/significant other Available Help at  Discharge: Family;Friend(s);Available PRN/intermittently(wife also with COVID) Type of Home: House Home Access: Stairs to enter [employer] of Steps: 1   Home Layout: Multi-level;Able to live on main level with bedroom/bathroom     Bathroom Shower/Tub: Occupational psychologist: Standard Bathroom Accessibility: Yes How Accessible: Accessible via walker;Accessible via wheelchair Home Equipment: Gilford Rile - 2 wheels;Bedside commode   Additional Comments: bariatric RW      Prior Functioning/Environment Level of Independence: Independent        Comments: driving self to dialysis, no AD        OT Problem List: Decreased knowledge of use of DME or AE;Decreased activity tolerance;Cardiopulmonary status limiting activity;Impaired balance (sitting and/or standing)      OT Treatment/Interventions: Self-care/ADL training;Therapeutic exercise;Patient/family education;Balance training;Energy conservation;Therapeutic activities;DME and/or AE instruction    OT Goals(Current goals can be found in the care plan section) Acute Rehab OT Goals Patient Stated Goal: go home OT Goal Formulation: With patient Time For Goal Achievement: 03/10/19 Potential to Achieve Goals: Good  OT Frequency: Min 2X/week   Barriers to D/C:            Co-evaluation PT/OT/SLP Co-Evaluation/Treatment: Yes Reason for Co-Treatment: To address functional/ADL transfers PT goals addressed during session: Mobility/safety with mobility;Balance;Proper use of DME;Strengthening/ROM OT goals addressed during session: ADL's and self-care;Proper use of Adaptive equipment and DME;Strengthening/ROM      AM-PAC OT "6 Clicks" Daily Activity     Outcome Measure Help from another person eating meals?: None Help from another person taking care of personal grooming?: None Help from another person toileting, which includes using toliet, bedpan, or urinal?: None Help from another person bathing (including  washing, rinsing, drying)?: None Help from another person to put on and taking off regular upper body clothing?: None Help from another person to put on and taking off regular lower body clothing?: None 6 Click Score: 24   End of Session Equipment Utilized During Treatment: Gait belt Nurse Communication: Mobility status  Activity Tolerance: Patient tolerated treatment well Patient left: in chair;with call bell/phone within reach  OT Visit Diagnosis: Unsteadiness on feet (R26.81);Other abnormalities of gait and mobility (R26.89)                Time: 5053-9767 OT Time Calculation (min): 34 min Charges:  OT General Charges $OT Visit: 1 Visit OT Evaluation $OT Eval Low Complexity: 1 Low  Zenovia Jarred, MSOT, OTR/L Acute Rehabilitation Services [facility] Office Number: [phone]  Zenovia Jarred 02/24/2019, 12:57 PM

## 2019-02-24 NOTE — Care Management (Signed)
Pt discharge home prior to CM assessment, prior to renal coordinator providing covid positive HD center information.  CM contacted pt via phone;  Pt refused HH as ordered,  Pt informed CM that pt has been given the information for [facility].

## 2019-02-25 ENCOUNTER — Telehealth: Payer: Self-pay | Admitting: Nephrology

## 2019-02-25 NOTE — Telephone Encounter (Signed)
Transition of care contact from inpatient facility  Date of Discharge: 02/24/2019 Date of Contact: 02/25/2019 - attempted Method of contact: Phone  Attempted to contact patient to discuss transition of carefrom inpatient admission. Patient did not answer the phone. Message was left on the patient's voicemail with call back number [phone].  Veneta Penton, PA-C [employer] Pager [phone]

## 2019-02-27 ENCOUNTER — Telehealth: Payer: Self-pay | Admitting: Nephrology

## 2019-02-27 NOTE — Telephone Encounter (Signed)
Transition of Care Contact from [place]   Date of Discharge: 02/24/19 Date of Contact: 02/27/19 Method of contact: phone Talked to patient   Patient contacted to discuss transition of care form recent hospitaliztion. Patient was admitted to [facility] from 02/19/19 to 02/24/19 with the discharge diagnosis of pneumonia d/t COVID 19 infection.    Medication changes were reviewed including addition of dexamethasone.   Patient will follow up with is outpatient dialysis center 02/28/2019.  Other follow up needs include none.   Jen Mow, PA-C [facility] Pager: [phone]

## 2019-03-29 ENCOUNTER — Encounter: Payer: Self-pay | Admitting: Internal Medicine

## 2019-04-11 ENCOUNTER — Ambulatory Visit (AMBULATORY_SURGERY_CENTER): Payer: Self-pay | Admitting: *Deleted

## 2019-04-11 ENCOUNTER — Other Ambulatory Visit: Payer: Self-pay

## 2019-04-11 VITALS — Temp 98.7°F | Ht 70.5 in | Wt 315.0 lb

## 2019-04-11 DIAGNOSIS — Z86010 Personal history of colonic polyps: Secondary | ICD-10-CM

## 2019-04-11 DIAGNOSIS — Z01818 Encounter for other preprocedural examination: Secondary | ICD-10-CM

## 2019-04-11 NOTE — Progress Notes (Signed)
Patient is here in-person for PV. Wife with pt in PV (her temp 98.0) Patient denies any allergies to eggs or soy. Patient denies any problems with anesthesia/sedation. Patient denies any oxygen use at home. Patient denies taking any diet/weight loss medications or blood thinners. Patient is not being treated for MRSA or C-diff.  COVID-19 screening test not needed, pt + Covid on 02/14/2019.Pt is aware that care partner will wait in the car during procedure; if they feel like they will be too hot or cold to wait in the car; they may wait in the 4 th floor lobby. Patient is aware to bring only one care partner. We want them to wear a mask (we do not have any that we can provide them), practice social distancing, and we will check their temperatures when they get here.  I did remind the patient that their care partner needs to stay in the parking lot the entire time and have a cell phone available, we will call them when the pt is ready for discharge. Patient will wear mask into building.

## 2019-04-25 ENCOUNTER — Ambulatory Visit (AMBULATORY_SURGERY_CENTER): Payer: Medicare Other | Admitting: Internal Medicine

## 2019-04-25 ENCOUNTER — Other Ambulatory Visit: Payer: Self-pay

## 2019-04-25 ENCOUNTER — Encounter: Payer: Self-pay | Admitting: Internal Medicine

## 2019-04-25 VITALS — BP 117/84 | HR 98 | Temp 96.9°F | Resp 15 | Ht 70.5 in | Wt 315.0 lb

## 2019-04-25 DIAGNOSIS — Z789 Other specified health status: Secondary | ICD-10-CM | POA: Insufficient documentation

## 2019-04-25 DIAGNOSIS — D122 Benign neoplasm of ascending colon: Secondary | ICD-10-CM

## 2019-04-25 DIAGNOSIS — Z86010 Personal history of colonic polyps: Secondary | ICD-10-CM

## 2019-04-25 HISTORY — DX: Other specified health status: Z78.9

## 2019-04-25 MED ORDER — SODIUM CHLORIDE 0.9 % IV SOLN: 500.0000 mL | Freq: Once | INTRAVENOUS | Status: DC

## 2019-04-25 NOTE — Progress Notes (Signed)
Report to PACU, RN, vss, BBS= Clear.  

## 2019-04-25 NOTE — Progress Notes (Signed)
Pt's states no medical or surgical changes since previsit or office visit. Temp by LC Vitals by CW

## 2019-04-25 NOTE — Progress Notes (Signed)
Called to room to assist during endoscopic procedure.  Patient ID and intended procedure confirmed with present staff. Received instructions for my participation in the procedure from the performing physician.  

## 2019-04-25 NOTE — Patient Instructions (Addendum)
I found and removed one tiny polyp. No signs of cancer.  You also have a condition called diverticulosis - common and not usually a problem. Please read the handout provided.  I will let you know pathology results and when/if to have another routine colonoscopy by mail and/or My Chart.   I appreciate the opportunity to care for you. Gatha Mayer, MD, FACG  YOU HAD AN ENDOSCOPIC PROCEDURE TODAY AT [facility]:   Refer to the procedure report that was given to you for any specific questions about what was found during the examination.  If the procedure report does not answer your questions, please call your gastroenterologist to clarify.  If you requested that your care partner not be given the details of your procedure findings, then the procedure report has been included in a sealed envelope for you to review at your convenience later.  YOU SHOULD EXPECT: Some feelings of bloating in the abdomen. Passage of more gas than usual.  Walking can help get rid of the air that was put into your GI tract during the procedure and reduce the bloating. If you had a lower endoscopy (such as a colonoscopy or flexible sigmoidoscopy) you may notice spotting of blood in your stool or on the toilet paper. If you underwent a bowel prep for your procedure, you may not have a normal bowel movement for a few days.  Please Note:  You might notice some irritation and congestion in your nose or some drainage.  This is from the oxygen used during your procedure.  There is no need for concern and it should clear up in a day or so.  SYMPTOMS TO REPORT IMMEDIATELY:   Following lower endoscopy (colonoscopy or flexible sigmoidoscopy):  Excessive amounts of blood in the stool  Significant tenderness or worsening of abdominal pains  Swelling of the abdomen that is new, acute  Fever of 100F or higher   For urgent or emergent issues, a gastroenterologist can be reached at any hour by calling [phone]  [phone]. Do not use MyChart messaging for urgent concerns.    DIET:  We do recommend a small meal at first, but then you may proceed to your regular diet.  Drink plenty of fluids but you should avoid alcoholic beverages for 24 hours.  ACTIVITY:  You should plan to take it easy for the rest of today and you should NOT DRIVE or use heavy machinery until tomorrow (because of the sedation medicines used during the test).    FOLLOW UP: Our staff will call the number listed on your records 48-72 hours following your procedure to check on you and address any questions or concerns that you may have regarding the information given to you following your procedure. If we do not reach you, we will leave a message.  We will attempt to reach you two times.  During this call, we will ask if you have developed any symptoms of COVID 19. If you develop any symptoms (ie: fever, flu-like symptoms, shortness of breath, cough etc.) before then, please call [phone].  If you test positive for Covid 19 in the 2 weeks post procedure, please call and report this information to [place].    If any biopsies were taken you will be contacted by phone or by letter within the next 1-3 weeks.  Please call us at [phone] if you have not heard about the biopsies in 3 weeks.    SIGNATURES/CONFIDENTIALITY: You and/or your care partner have signed  paperwork which will be entered into your electronic medical record.  These signatures attest to the fact that that the information above on your After Visit Summary has been reviewed and is understood.  Full responsibility of the confidentiality of this discharge information lies with you and/or your care-partner.

## 2019-04-25 NOTE — Op Note (Signed)
[facility] Patient Name: Richard Bush Procedure Date: 04/25/2019 11:55 AM MRN: [id number] Endoscopist: Gatha Mayer , MD Age: 69 Referring MD:  Date of Birth: [birth year] Gender: Male Account #: [id number] Procedure:                Colonoscopy Indications:              High risk colon cancer surveillance: Personal                            history of colonic polyps Medicines:                Propofol per Anesthesia, Monitored Anesthesia Care Procedure:                Pre-Anesthesia Assessment:                           - Prior to the procedure, a History and Physical                            was performed, and patient medications and                            allergies were reviewed. The patient's tolerance of                            previous anesthesia was also reviewed. The risks                            and benefits of the procedure and the sedation                            options and risks were discussed with the patient.                            All questions were answered, and informed consent                            was obtained. Prior Anticoagulants: The patient has                            taken no previous anticoagulant or antiplatelet                            agents. ASA Grade Assessment: III - A patient with                            severe systemic disease. After reviewing the risks                            and benefits, the patient was deemed in                            satisfactory condition to undergo the procedure.  After obtaining informed consent, the colonoscope                            was passed under direct vision. Throughout the                            procedure, the patient's blood pressure, pulse, and                            oxygen saturations were monitored continuously. The                            Colonoscope was introduced through the anus and                            advanced to the  the cecum, identified by                            appendiceal orifice and ileocecal valve. The                            colonoscopy was performed without difficulty. The                            patient tolerated the procedure well. The quality                            of the bowel preparation was adequate. The                            ileocecal valve, appendiceal orifice, and rectum                            were photographed. The bowel preparation used was                            Miralax via split dose instruction. Scope In: 12:01:09 PM Scope Out: 12:13:45 PM Scope Withdrawal Time: 0 hours 9 minutes 44 seconds  Total Procedure Duration: 0 hours 12 minutes 36 seconds  Findings:                 The perianal and digital rectal examinations were                            normal.                           A diminutive polyp was found in the ascending                            colon. The polyp was sessile. The polyp was removed                            with a cold snare. Resection and retrieval were  complete. Verification of patient identification                            for the specimen was done. Estimated blood loss was                            minimal.                           Multiple diverticula were found in the sigmoid                            colon and descending colon.                           The exam was otherwise without abnormality on                            direct and retroflexion views. Complications:            No immediate complications. Estimated Blood Loss:     Estimated blood loss was minimal. Impression:               - One diminutive polyp in the ascending colon,                            removed with a cold snare. Resected and retrieved.                           - Diverticulosis in the sigmoid colon and in the                            descending colon.                           - The examination was  otherwise normal on direct                            and retroflexion views.                           - Personal history of colonic polyps. 2 small                            adenomas 2018 (this exam was part of a                            pre-transplant evaluation - kidney) Recommendation:           - Patient has a contact number available for                            emergencies. The signs and symptoms of potential                            delayed complications were discussed with the  patient. Return to normal activities tomorrow.                            Written discharge instructions were provided to the                            patient.                           - Resume previous diet.                           - Continue present medications.                           - Repeat colonoscopy is recommended. The                            colonoscopy date will be determined after pathology                            results from today's exam become available for                            review. Gatha Mayer, MD 04/25/2019 12:22:58 PM This report has been signed electronically.

## 2019-04-27 ENCOUNTER — Telehealth: Payer: Self-pay | Admitting: *Deleted

## 2019-04-27 NOTE — Telephone Encounter (Signed)
  Follow up Call-  Call back number 04/25/2019  Post procedure Call Back phone  # [phone]  Permission to leave phone message Yes  Some recent data might be hidden     Patient questions:  Do you have a fever, pain , or abdominal swelling? No. Pain Score  0 *  Have you tolerated food without any problems? Yes.    Have you been able to return to your normal activities? Yes.    Do you have any questions about your discharge instructions: Diet   No. Medications  No. Follow up visit  No.  Do you have questions or concerns about your Care? No.  Actions: * If pain score is 4 or above: No action needed, pain <4  1. Have you developed a fever since your procedure? NO  2.   Have you had an respiratory symptoms (SOB or cough) since your procedure? NO  3.   Have you tested positive for COVID 19 since your procedure NO  4.   Have you had any family members/close contacts diagnosed with the COVID 19 since your procedure?  NO   If yes to any of these questions please route to Joylene John, RN and Alphonsa Gin, RN.

## 2019-05-02 ENCOUNTER — Encounter: Payer: Self-pay | Admitting: Internal Medicine

## 2019-05-24 ENCOUNTER — Other Ambulatory Visit: Payer: Self-pay | Admitting: Rheumatology

## 2019-05-25 ENCOUNTER — Other Ambulatory Visit: Payer: Self-pay | Admitting: *Deleted

## 2019-05-25 DIAGNOSIS — M1A39X0 Chronic gout due to renal impairment, multiple sites, without tophus (tophi): Secondary | ICD-10-CM

## 2019-05-25 NOTE — Telephone Encounter (Signed)
He was advised to reduce febuxostat 40 mg, half tablet every other day at the last visit.  He was also supposed to get uric acid level a month after being on reduced dose.  Please check what dose patient is on.  If he is on reduced dose then he should get a uric acid level.

## 2019-05-25 NOTE — Telephone Encounter (Signed)
yes

## 2019-05-25 NOTE — Telephone Encounter (Signed)
Spoke with patient and he is on Uloric 20 mg. Patient advised to update Uric Acid level and orders released.

## 2019-05-25 NOTE — Telephone Encounter (Signed)
Per your discussion with the patient he does not want to reduce Uloric to half a tablet every other day.  He wants to continue Uloric 20 mg p.o. daily.  We will continue to check labs every 6 months including CBC, CMP and uric acid.  Okay to refill prescription.

## 2019-05-25 NOTE — Telephone Encounter (Signed)
Last Visit: 01/19/19 Next Visit: 07/20/19 Labs: 02/24/19 RBC 3.27, Hgb 10.4, Hct 30.9, Sodium 132, Chloride 87, BUN 76, Creat. 11.05  GFR 4, Calcium 8.5, Albumin 2.9, AST 91, ALT 72  Current Dose per office note on 01/19/19: Uloric 20 mg po daily   Okay to refill Uloric?

## 2019-05-29 ENCOUNTER — Other Ambulatory Visit (HOSPITAL_BASED_OUTPATIENT_CLINIC_OR_DEPARTMENT_OTHER): Payer: Self-pay

## 2019-05-29 DIAGNOSIS — G478 Other sleep disorders: Secondary | ICD-10-CM

## 2019-06-12 ENCOUNTER — Other Ambulatory Visit (HOSPITAL_COMMUNITY)
Admission: RE | Admit: 2019-06-12 | Discharge: 2019-06-12 | Disposition: A | Discharge status: Home / Self Care | Payer: Medicare Other | Source: Ambulatory Visit | Attending: Nephrology | Admitting: Nephrology

## 2019-06-12 ENCOUNTER — Other Ambulatory Visit: Payer: Self-pay

## 2019-06-12 DIAGNOSIS — Z20822 Contact with and (suspected) exposure to covid-19: Secondary | ICD-10-CM | POA: Diagnosis not present

## 2019-06-12 DIAGNOSIS — Z01812 Encounter for preprocedural laboratory examination: Secondary | ICD-10-CM | POA: Diagnosis present

## 2019-06-12 LAB — SARS CORONAVIRUS 2 (TAT 6-24 HRS): SARS Coronavirus 2: NEGATIVE

## 2019-06-14 ENCOUNTER — Ambulatory Visit: Payer: Medicare Other | Attending: Nephrology | Admitting: Neurology

## 2019-06-14 ENCOUNTER — Other Ambulatory Visit: Payer: Self-pay

## 2019-06-14 DIAGNOSIS — Z7682 Awaiting organ transplant status: Secondary | ICD-10-CM | POA: Insufficient documentation

## 2019-06-14 DIAGNOSIS — Z79899 Other long term (current) drug therapy: Secondary | ICD-10-CM | POA: Diagnosis not present

## 2019-06-14 DIAGNOSIS — Z01818 Encounter for other preprocedural examination: Secondary | ICD-10-CM | POA: Insufficient documentation

## 2019-06-14 DIAGNOSIS — G4733 Obstructive sleep apnea (adult) (pediatric): Secondary | ICD-10-CM | POA: Diagnosis not present

## 2019-06-14 DIAGNOSIS — G478 Other sleep disorders: Secondary | ICD-10-CM

## 2019-06-22 NOTE — Procedures (Signed)
[place] A. Merlene Laughter, MD     [email or web address]             NOCTURNAL POLYSOMNOGRAPHY   LOCATION: [facility]   Patient Name: Richard Bush, Richard Bush Date: 06/14/2019 Gender: Male D.O.B: 02/01/1951 Age (years): 71 Referring Provider: Wanita Chamberlain MD Height (inches): 71 Interpreting Physician: Phillips Odor MD, ABSM Weight (lbs): 317 RPSGT: Peak, Robert BMI: 45 MRN: [id number] Neck Size: 19.00 CLINICAL INFORMATION Sleep Study Type: NPSG     Indication for sleep study: N/A     Epworth Sleepiness Score: 11     SLEEP STUDY TECHNIQUE As per the AASM Manual for the Scoring of Sleep and Associated Events v2.3 (April 2016) with a hypopnea requiring 4% desaturations.  The channels recorded and monitored were frontal, central and occipital EEG, electrooculogram (EOG), submentalis EMG (chin), nasal and oral airflow, thoracic and abdominal wall motion, anterior tibialis EMG, snore microphone, electrocardiogram, and pulse oximetry.  MEDICATIONS Medications self-administered by patient taken the night of the study : N/A  Current Outpatient Medications:  .  acetaminophen (TYLENOL) 650 MG CR tablet, Take 650 mg by mouth every 8 (eight) hours as needed for pain., Disp: , Rfl:  .  calcium acetate (PHOSLO) 667 MG capsule, Take 1 capsule (667 mg total) by mouth 3 (three) times daily with meals., Disp: 90 capsule, Rfl: 0 .  cetirizine (ZYRTEC) 10 MG tablet, Take 10 mg by mouth at bedtime., Disp: , Rfl:  .  cinacalcet (SENSIPAR) 30 MG tablet, Take 30 mg by mouth at bedtime., Disp: , Rfl:  .  colchicine 0.6 MG tablet, TAKE 1 TABLET BY MOUTH ONCE DAILY AS NEEDED FOR GOUT FLARE UPS (Patient taking differently: Take 0.6 mg by mouth daily as needed (gout). ), Disp: 90 tablet, Rfl: 0 .  dextromethorphan (DELSYM) 30 MG/5ML liquid, Take 5 mLs (30 mg total) by mouth 2 (two) times daily as needed for cough., Disp: 89 mL, Rfl: 0 .  docusate sodium (COLACE) 100 MG capsule, Take 100 mg  by mouth daily., Disp: , Rfl:  .  DULoxetine (CYMBALTA) 30 MG capsule, Take 30 mg by mouth daily. , Disp: , Rfl:  .  febuxostat (ULORIC) 40 MG tablet, Take 1/2 (one-half) tablet by mouth once daily, Disp: 45 tablet, Rfl: 0 .  folic acid-vitamin b complex-vitamin c-selenium-zinc (DIALYVITE) 3 MG TABS tablet, Take 1 tablet by mouth every evening. , Disp: , Rfl:  .  gabapentin (NEURONTIN) 300 MG capsule, Take 300 mg by mouth every evening. , Disp: , Rfl:  .  ibuprofen (ADVIL,MOTRIN) 200 MG tablet, Take 400 mg by mouth every 6 (six) hours as needed for mild pain. , Disp: , Rfl:  .  midodrine (PROAMATINE) 10 MG tablet, Take 10 mg by mouth every Monday, Wednesday, and Friday. 30 minutes before dialysis, Disp: , Rfl:  .  omeprazole (PRILOSEC) 20 MG capsule, Take 20 mg by mouth every morning., Disp: , Rfl:  .  sevelamer carbonate (RENVELA) 800 MG tablet, Take 2,400 mg by mouth 3 (three) times daily with meals. , Disp: , Rfl:      SLEEP ARCHITECTURE The study was initiated at 9:28:17 PM and ended at 4:30:55 AM.  Sleep onset time was 25.4 minutes and the sleep efficiency was 63.4%%. The total sleep time was 268 minutes.  Stage REM latency was N/A minutes.  The patient spent 16.8%% of the night in stage N1 sleep, 83.2%% in stage N2 sleep, 0.0%% in stage N3 and 0% in REM. There appears to be frequent arousal  and fragmentation of sleep.  Alpha intrusion was absent.  Supine sleep was 76.87%.  RESPIRATORY PARAMETERS The overall apnea/hypopnea index (AHI) was 30.7 per hour. There were 14 total apneas, including 13 obstructive, 1 central and 0 mixed apneas. There were 123 hypopneas and 19 RERAs.  The AHI during Stage REM sleep was N/A per hour.  AHI while supine was 34.4 per hour.  The mean oxygen saturation was 87.2%. The minimum SpO2 during sleep was 74.0%.  loud snoring was noted during this study.  CARDIAC DATA The 2 lead EKG demonstrated sinus rhythm. The mean heart rate was 82.0 beats per  minute. Other EKG findings include: None.  LEG MOVEMENT DATA The total PLMS were 0 with a resulting PLMS index of 0.0. Associated arousal with leg movement index was 0.0.  IMPRESSIONS 1. Moderate obstructive sleep apnea is documented with this recording. AutoPAP 8-20 is recommended.  2. Abnormal architecture is noted with absent slow-wave sleep, absent REM sleep, increased fragmentation and frequent arousal.     Ashleigh Luckow, [place], American Board of Sleep Medicine  ELECTRONICALLY SIGNED ON:  06/22/2019, 2:31 PM [place] PH: [phone]   FX: [phone] [place]

## 2019-07-07 NOTE — Progress Notes (Signed)
Office Visit Note  Patient: Richard Bush             Date of Birth: Dec 23, 1950           MRN: [id number]             PCP: Quentin Cornwall, MD Referring: Quentin Cornwall, MD Visit Date: 07/20/2019 Occupation: @GUAROCC @  Subjective:  Medication monitoring   History of Present Illness: Richard Bush is a 69 y.o. male with history of gout and osteoarthritis.  He is taking uloric 40 mg 1/2 tablet daily and colchicine 0.6 mg 1 tablet daily as needed during gout flares.  He denies any recent gout flares.  He denies any increased joint pain or joint swelling.  He has noticed his right ring finger triggering intermittently. He continues to have pain in both shoulders joints.  He had a left shoulder injection on 07/21/19 and repeat injection in both shoulders by Dr. Mayer Camel in the fall of 2021.  He has noticed increased joint pain and inflammation in both hands, which is exacerbated by activity.  He is currently awaiting a renal transplant.   Activities of Daily Living:  Patient reports morning stiffness for 10 minutes.   Patient Reports nocturnal pain.  Difficulty dressing/grooming: Denies Difficulty climbing stairs: Denies Difficulty getting out of chair: Denies Difficulty using hands for taps, buttons, cutlery, and/or writing: Reports  Review of Systems  Constitutional: Negative for fatigue and night sweats.  HENT: Negative for mouth sores, mouth dryness and nose dryness.   Eyes: Negative for redness and dryness.  Respiratory: Negative for cough, hemoptysis, shortness of breath and difficulty breathing.   Cardiovascular: Positive for swelling in legs/feet. Negative for chest pain, palpitations, hypertension and irregular heartbeat.  Gastrointestinal: Negative for blood in stool, constipation and diarrhea.  Endocrine: Negative for excessive thirst and increased urination.  Genitourinary: Negative for difficulty urinating and painful urination.  Musculoskeletal: Positive for arthralgias, joint pain,  joint swelling, muscle weakness and morning stiffness. Negative for myalgias, muscle tenderness and myalgias.  Skin: Negative for color change, rash, hair loss, nodules/bumps, skin tightness, ulcers and sensitivity to sunlight.  Allergic/Immunologic: Negative for susceptible to infections.  Neurological: Negative for dizziness, fainting, memory loss and night sweats.  Hematological: Negative for bruising/bleeding tendency and swollen glands.  Psychiatric/Behavioral: Positive for sleep disturbance. Negative for depressed mood. The patient is not nervous/anxious.     PMFS History:  Patient Active Problem List   Diagnosis Date Noted  . Difficult intravenous access 04/25/2019  . ESRD on hemodialysis ([place])   . ESRD (end stage renal disease) ([place]) 01/21/2017  . Orthostatic dizziness   . Pressure injury of skin 08/04/2016  . 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 Anemia secondary to renal failure 08/04/2016  . Pulmonary nodule 08/03/2016  . ESRD on dialysis ([place]) 08/03/2016  . Right bundle branch block 08/03/2016  . History of gastroesophageal reflux (GERD) 07/13/2016  . Idiopathic chronic gout without tophus 07/03/2016  . Primary osteoarthritis of left knee 07/03/2016  . Stage 4 chronic kidney disease ([place]) 07/03/2016  . Osteoarthritis of hand 07/03/2016  . Hypertension 07/03/2016  . Secondary hyperparathyroidism ([place]) 07/03/2016  . Primary osteoarthritis of both feet 07/03/2016  . History of total hip replacement, right 07/03/2016  . Hx of total knee replacement, bilateral 07/03/2016    Past Medical History:  Diagnosis Date  . Acute respiratory failure with hypoxia ([place]) 02/19/2019  . Anemia   . Bleeding pseudoaneurysm of left brachiocephalic AV fistula, initial encounter ([place]) 01/05/2017  .  Chronic right hip pain   . Difficult intravenous access 04/25/2019  . ESRD (end stage renal disease) on dialysis [facility])    dialysis M/W/F- [facility] (01/06/2017)  . GERD  (gastroesophageal reflux disease)   . Gout   . High cholesterol   . Hypertension   . Neuropathy   . Osteoarthritis    "all over" (01/06/2017)  . Pleurisy   . Pneumonia 1980s X 1  . Pneumonia due to COVID-19 virus 02/20/2019    Family History  Problem Relation Age of Onset  . Heart disease Mother   . Cancer Father   . Colon polyps Father   . Colon cancer Neg Hx   . Esophageal cancer Neg Hx   . Rectal cancer Neg Hx   . Stomach cancer Neg Hx    Past Surgical History:  Procedure Laterality Date  . A/V FISTULAGRAM Left 12/24/2016   Procedure: A/V FISTULAGRAM;  Surgeon: Waynetta Sandy, MD;  Location: [facility];  Service: Cardiovascular;  Laterality: Left;  . APPENDECTOMY  1963  . AV FISTULA PLACEMENT Left 08/04/2016   Procedure: ARTERIOVENOUS (AV) FISTULA CREATION/LEFT ARM;  Surgeon: Serafina Mitchell, MD;  Location: [facility];  Service: Vascular;  Laterality: Left;  . AV FISTULA REPAIR Left 01/06/2017   arm  . BILATERAL CARPAL TUNNEL RELEASE Bilateral 2006-2007   right-left  . CHOLECYSTECTOMY OPEN  1979  . COLONOSCOPY    . FINGER SURGERY Right 2006   "thumb; took out bone that conects to wrist and a piece of tendon; weaved tendon back in for mobility"  . FISTULA SUPERFICIALIZATION Left 09/24/2016   Procedure: FISTULA SUPERFICIALIZATION- LEFT ARM;  Surgeon: Serafina Mitchell, MD;  Location: [facility];  Service: Vascular;  Laterality: Left;  . HAMMER TOE SURGERY Left 1981  . INSERTION OF DIALYSIS CATHETER Left 08/04/2016   Procedure: INSERTION OF DIALYSIS CATHETER;  Surgeon: Serafina Mitchell, MD;  Location: [facility];  Service: Vascular;  Laterality: Left;  . JOINT REPLACEMENT    . LIGATION OF ARTERIOVENOUS  FISTULA Left 01/21/2017   Procedure: REPAIR OF ARTERIOVENOUS  FISTULA;  Surgeon: Angelia Mould, MD;  Location: [facility];  Service: Vascular;  Laterality: Left;  . LUMBAR LAMINECTOMY/DECOMPRESSION MICRODISCECTOMY N/A 08/18/2012   Procedure: LUMBAR LAMINECTOMY/DECOMPRESSION  MICRODISCECTOMY 3 LEVELS;  Surgeon: Hosie Spangle, MD;  Location: [facility];  Service: Neurosurgery;  Laterality: N/A;  Lumbar Two through Five Laminectomy  . REVISON OF ARTERIOVENOUS FISTULA Left 01/06/2017   Procedure: REVISON OF ARTERIOVENOUS FISTULA LEFT ARM;  Surgeon: Serafina Mitchell, MD;  Location: [facility];  Service: Vascular;  Laterality: Left;  . SHOULDER OPEN ROTATOR CUFF REPAIR Right 2001  . THROMBECTOMY AND REVISION OF ARTERIOVENTOUS (AV) GORETEX  GRAFT Left 01/17/2017   Procedure: REVISION OF ARTERIOVENTOUS (AV) FISTULA LEFT ARM;  Surgeon: Waynetta Sandy, MD;  Location: [facility];  Service: Vascular;  Laterality: Left;  . TOTAL HIP ARTHROPLASTY Right 07/26/2013   Procedure: TOTAL HIP ARTHROPLASTY;  Surgeon: Kerin Salen, MD;  Location: [facility];  Service: Orthopedics;  Laterality: Right;  . TOTAL KNEE ARTHROPLASTY Bilateral 2008-2010   right-left  . WRIST TENDON TRANSFER Bilateral 1972-1973   left-right   Social History   Social History Narrative  . Not on file   Immunization History  Administered Date(s) Administered  . Pneumococcal Polysaccharide-23 08/06/2016     Objective: Vital Signs: BP (!) 142/65 (BP Location: Right Arm, Patient Position: Sitting, Cuff Size: Normal)   Pulse 64   Resp 18  Ht 5' 10.5" (1.791 m)   Wt (!) 309 lb 3.2 oz (140.3 kg)   BMI 43.74 kg/m    Physical Exam Vitals and nursing note reviewed.  Constitutional:      Appearance: He is well-developed.  HENT:     Head: Normocephalic and atraumatic.  Eyes:     Conjunctiva/sclera: Conjunctivae normal.     Pupils: Pupils are equal, round, and reactive to light.  Pulmonary:     Effort: Pulmonary effort is normal.  Abdominal:     General: Bowel sounds are normal.     Palpations: Abdomen is soft.  Musculoskeletal:     Cervical back: Normal range of motion and neck supple.  Skin:    General: Skin is warm and dry.     Capillary Refill: Capillary refill takes less than 2 seconds.   Neurological:     Mental Status: He is alert and oriented to person, place, and time.  Psychiatric:        Behavior: Behavior normal.      Musculoskeletal Exam: C-spine limited ROM.  Thoracic kyphosis noted. No midline spinal tenderness.  No SI joint tenderness. Painful and limited ROM of both shoulders.  Elbow joints, wrist joints, MCPs, PIPs, and DIPs good ROM with no synovitis.  PIP and DIP thickening consistent with osteoarthritis of both hands.  Right ring trigger finger.  Synovial thickening of both 2nd MCP joints.  Bilateral knee replacements good ROM with no warmth or effusion.  Ankle joints good ROM with no discomfort.    CDAI Exam: CDAI Score: -- Patient Global: --; Provider Global: -- Swollen: --; Tender: -- Joint Exam 07/20/2019   No joint exam has been documented for this visit   There is currently no information documented on the homunculus. Go to the Rheumatology activity and complete the homunculus joint exam.  Investigation: No additional findings.  Imaging: SLEEP STUDY DOCUMENTS  Result Date: 06/30/2019 Ordered by an unspecified provider.   Recent Labs: Lab Results  Component Value Date   WBC 7.4 07/18/2019   HGB 10.8 (L) 07/18/2019   PLT 241 07/18/2019   NA 137 07/18/2019   K 5.1 07/18/2019   CL 90 (L) 07/18/2019   CO2 28 07/18/2019   GLUCOSE 94 07/18/2019   BUN 52 (H) 07/18/2019   CREATININE 7.53 (H) 07/18/2019   BILITOT 0.3 07/18/2019   ALKPHOS 72 07/18/2019   AST 20 07/18/2019   ALT 21 07/18/2019   PROT 7.3 07/18/2019   ALBUMIN 4.7 07/18/2019   CALCIUM 9.9 07/18/2019   GFRAA 8 (L) 07/18/2019    Speciality Comments: No specialty comments available.  Procedures:  No procedures performed Allergies: Cozaar [losartan potassium], Hylan g-f 20, Kayexalate [polystyrene], Cholestyramine, Losartan, Other, Ramipril, Sodium polystyrene sulfonate, Cholestyramine light, and Sulfa antibiotics   Assessment / Plan:     Visit Diagnoses: Chronic gout due  to renal impairment of multiple sites without tophus - He has not had any recent gout flares.  He is clinically doing well on Uloric 40 mg 1/2 tablet daily.  He has not needed to take colchicine 0.6 mg 1 tablet daily as needed for over 1 year.  His uric acid level was 2.9 on 07/18/19. He remains on dialysis and is awaiting a renal transplant. He will continue on the current treatment regimen.  He does not need any refills at this time.  He was advised to notify us if he develops signs or symptoms of a flare.  He will follow up in 6 months.  Primary osteoarthritis of both hands: He has PIP and DIP thickening consistent with osteoarthritis of both hands.  Tenderness of the left CMC joint noted.  Joint protection and muscle strengthening were discussed.   Chronic pain of both shoulders: He continues to have chronic pain and stiffness in both shoulder joints.  He has limited abduction to about 120 degrees bilaterally.  He experiences nocturnal pain intermittently.  He had a left shoulder joint x-ray performed on 11/18/2017 which did not reveal any glenohumeral joint space narrowing.  He did have calcification around the [facility] joint.  He had a left shoulder cortisone injection at that office visit did not receive much relief.  He followed up with Dr. Mayer Camel in fall 2021 and had cortisone injections in both shoulders which provided very temporary relief.  He will be following up with Dr. Mayer Camel to discuss the next steps.  Trigger finger, right ring finger: He has been experiencing triggering and tenderness for the past 3 weeks.  He declined a cortisone injection today, but he will notify us if he would like to return for an injection.   Primary osteoarthritis of both feet:  He is not having any pain in his feet currently.  He wears proper fitting shoes.    History of total hip replacement, right: Doing well.  He has no discomfort at this time.   Hx of total knee replacement, bilateral:  Doing well.  He has good ROM  with no discomfort.  No warmth or effusion noted.   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  No orders of the defined types were placed in this encounter.     Follow-Up Instructions: Return in about 6 months (around 01/19/2020) for Gout, Osteoarthritis.  Richard Neas, PA-C  I examined and evaluated the patient with Richard Sams PA. Patient's gout is under good control. The uric acid is in desirable range. He continues to have a lot of pain and discomfort in his bilateral shoulders. He has been seeing Dr. Mayer Camel for his shoulders. He is awaiting renal transplant. The plan of care was discussed as noted above.  Bo Merino, MD  Note - This record has been created using Editor, commissioning.  Chart creation errors have been sought, but may not always  have been located. Such creation errors do not reflect on  the standard of medical care.

## 2019-07-18 ENCOUNTER — Telehealth: Payer: Self-pay | Admitting: Rheumatology

## 2019-07-18 DIAGNOSIS — M1A39X0 Chronic gout due to renal impairment, multiple sites, without tophus (tophi): Secondary | ICD-10-CM

## 2019-07-18 DIAGNOSIS — Z79899 Other long term (current) drug therapy: Secondary | ICD-10-CM

## 2019-07-18 NOTE — Telephone Encounter (Signed)
Lab Orders released.  

## 2019-07-18 NOTE — Telephone Encounter (Signed)
Patient called requesting labwork orders be sent to [facility] in [place] this morning 07/18/19.

## 2019-07-20 ENCOUNTER — Other Ambulatory Visit: Payer: Self-pay

## 2019-07-20 ENCOUNTER — Ambulatory Visit: Payer: Medicare Other | Admitting: Rheumatology

## 2019-07-20 ENCOUNTER — Encounter: Payer: Self-pay | Admitting: Rheumatology

## 2019-07-20 VITALS — BP 142/65 | HR 64 | Resp 18 | Ht 70.5 in | Wt 309.2 lb

## 2019-07-20 DIAGNOSIS — M65341 Trigger finger, right ring finger: Secondary | ICD-10-CM | POA: Diagnosis not present

## 2019-07-20 DIAGNOSIS — M19072 Primary osteoarthritis, left ankle and foot: Secondary | ICD-10-CM

## 2019-07-20 DIAGNOSIS — M19041 Primary osteoarthritis, right hand: Secondary | ICD-10-CM | POA: Diagnosis not present

## 2019-07-20 DIAGNOSIS — Z87448 Personal history of other diseases of urinary system: Secondary | ICD-10-CM

## 2019-07-20 DIAGNOSIS — M1A39X0 Chronic gout due to renal impairment, multiple sites, without tophus (tophi): Secondary | ICD-10-CM | POA: Diagnosis not present

## 2019-07-20 DIAGNOSIS — M25512 Pain in left shoulder: Secondary | ICD-10-CM

## 2019-07-20 DIAGNOSIS — Z96653 Presence of artificial knee joint, bilateral: Secondary | ICD-10-CM

## 2019-07-20 DIAGNOSIS — G8929 Other chronic pain: Secondary | ICD-10-CM

## 2019-07-20 DIAGNOSIS — M19071 Primary osteoarthritis, right ankle and foot: Secondary | ICD-10-CM | POA: Diagnosis not present

## 2019-07-20 DIAGNOSIS — M25511 Pain in right shoulder: Secondary | ICD-10-CM

## 2019-07-20 DIAGNOSIS — Z8719 Personal history of other diseases of the digestive system: Secondary | ICD-10-CM

## 2019-07-20 DIAGNOSIS — M19042 Primary osteoarthritis, left hand: Secondary | ICD-10-CM

## 2019-07-20 DIAGNOSIS — N2581 Secondary hyperparathyroidism of renal origin: Secondary | ICD-10-CM

## 2019-07-20 DIAGNOSIS — Z96641 Presence of right artificial hip joint: Secondary | ICD-10-CM

## 2019-07-20 LAB — CBC WITH DIFFERENTIAL/PLATELET
Basophils Absolute: 0 10*3/uL (ref 0.0–0.2)
Basos: 0 %
EOS (ABSOLUTE): 0.3 10*3/uL (ref 0.0–0.4)
Eos: 5 %
Hematocrit: 32.2 % — ABNORMAL LOW (ref 37.5–51.0)
Hemoglobin: 10.8 g/dL — ABNORMAL LOW (ref 13.0–17.7)
Immature Grans (Abs): 0 10*3/uL (ref 0.0–0.1)
Immature Granulocytes: 0 %
Lymphocytes Absolute: 2.4 10*3/uL (ref 0.7–3.1)
Lymphs: 33 %
MCH: 32.3 pg (ref 26.6–33.0)
MCHC: 33.5 g/dL (ref 31.5–35.7)
MCV: 96 fL (ref 79–97)
Monocytes Absolute: 0.5 10*3/uL (ref 0.1–0.9)
Monocytes: 7 %
Neutrophils Absolute: 4 10*3/uL (ref 1.4–7.0)
Neutrophils: 55 %
Platelets: 241 10*3/uL (ref 150–450)
RBC: 3.34 x10E6/uL — ABNORMAL LOW (ref 4.14–5.80)
RDW: 13.2 % (ref 11.6–15.4)
WBC: 7.4 10*3/uL (ref 3.4–10.8)

## 2019-07-20 LAB — CMP14+EGFR
ALT: 21 IU/L (ref 0–44)
AST: 20 IU/L (ref 0–40)
Albumin/Globulin Ratio: 1.8 (ref 1.2–2.2)
Albumin: 4.7 g/dL (ref 3.8–4.8)
Alkaline Phosphatase: 72 IU/L (ref 48–121)
BUN/Creatinine Ratio: 7 — ABNORMAL LOW (ref 10–24)
BUN: 52 mg/dL — ABNORMAL HIGH (ref 8–27)
Bilirubin Total: 0.3 mg/dL (ref 0.0–1.2)
CO2: 28 mmol/L (ref 20–29)
Calcium: 9.9 mg/dL (ref 8.6–10.2)
Chloride: 90 mmol/L — ABNORMAL LOW (ref 96–106)
Creatinine, Ser: 7.53 mg/dL — ABNORMAL HIGH (ref 0.76–1.27)
GFR calc Af Amer: 8 mL/min/{1.73_m2} — ABNORMAL LOW (ref >59)
GFR calc non Af Amer: 7 mL/min/{1.73_m2} — ABNORMAL LOW (ref >59)
Globulin, Total: 2.6 g/dL (ref 1.5–4.5)
Glucose: 94 mg/dL (ref 65–99)
Potassium: 5.1 mmol/L (ref 3.5–5.2)
Sodium: 137 mmol/L (ref 134–144)
Total Protein: 7.3 g/dL (ref 6.0–8.5)

## 2019-07-20 LAB — URIC ACID: Uric Acid: 2.9 mg/dL — ABNORMAL LOW (ref 3.8–8.4)

## 2019-08-22 ENCOUNTER — Other Ambulatory Visit: Payer: Self-pay | Admitting: Rheumatology

## 2019-08-22 NOTE — Telephone Encounter (Signed)
Last visit: 07/20/2019 Next Visit: 01/18/2020 Labs: 07/18/2019 RBC 3.34, Hgb 10.8 Hct 32.2, BUN 52, Creat. 7.53, GFR 7, BUN/Creat Ratio 7, Chloride 90, Uric Acid 2.9  Current Dose per office note 07/20/2019:  Uloric 40 mg 1/2 tablet daily DX: Chronic gout due to renal impairment   Okay to refill Uloric?

## 2019-08-23 ENCOUNTER — Telehealth: Payer: Self-pay | Admitting: *Deleted

## 2019-08-23 NOTE — Telephone Encounter (Addendum)
Submitted a Prior Authorization request to [employer] for Uloric via Cover My Meds. Will update once we receive a response.

## 2019-08-24 NOTE — Telephone Encounter (Signed)
Received notification from [facility] regarding a prior authorization for Uloric. Authorization has been APPROVED from 05/25/2019 to 08/22/2020.

## 2019-10-26 ENCOUNTER — Other Ambulatory Visit (HOSPITAL_COMMUNITY): Payer: Self-pay | Admitting: Family Medicine

## 2019-10-26 ENCOUNTER — Other Ambulatory Visit (HOSPITAL_COMMUNITY): Payer: Self-pay

## 2019-10-26 DIAGNOSIS — Z7682 Awaiting organ transplant status: Secondary | ICD-10-CM

## 2019-11-08 ENCOUNTER — Telehealth (HOSPITAL_COMMUNITY): Payer: Self-pay | Admitting: *Deleted

## 2019-11-08 NOTE — Telephone Encounter (Signed)
Patient given detailed instructions per Myocardial Perfusion Study Information Sheet for the test on 11/15/2019 at 1315. Patient notified to arrive 15 minutes early and that it is imperative to arrive on time for appointment to keep from having the test rescheduled.  If you need to cancel or reschedule your appointment, please call the office within 24 hours of your appointment. . Patient verbalized understanding.Undray Allman, [place] No mychart available

## 2019-11-15 ENCOUNTER — Other Ambulatory Visit: Payer: Self-pay

## 2019-11-15 ENCOUNTER — Ambulatory Visit (HOSPITAL_COMMUNITY): Payer: PRIVATE HEALTH INSURANCE | Attending: Internal Medicine

## 2019-11-15 VITALS — Ht 70.0 in | Wt 315.0 lb

## 2019-11-15 DIAGNOSIS — Z7682 Awaiting organ transplant status: Secondary | ICD-10-CM

## 2019-11-15 DIAGNOSIS — R110 Nausea: Secondary | ICD-10-CM

## 2019-11-15 DIAGNOSIS — I129 Hypertensive chronic kidney disease with stage 1 through stage 4 chronic kidney disease, or unspecified chronic kidney disease: Secondary | ICD-10-CM | POA: Insufficient documentation

## 2019-11-15 DIAGNOSIS — N189 Chronic kidney disease, unspecified: Secondary | ICD-10-CM | POA: Diagnosis not present

## 2019-11-15 MED ORDER — TECHNETIUM TC 99M TETROFOSMIN IV KIT
30.4000 | PACK | Freq: Once | INTRAVENOUS | Status: AC | PRN
  Administered 2019-11-15: 30.4 via INTRAVENOUS
  Filled 2019-11-15: qty 31

## 2019-11-15 MED ORDER — AMINOPHYLLINE 25 MG/ML IV SOLN
75.0000 mg | Freq: Once | INTRAVENOUS | Status: AC
  Administered 2019-11-15: 75 mg via INTRAVENOUS

## 2019-11-15 MED ORDER — REGADENOSON 0.4 MG/5ML IV SOLN
0.4000 mg | Freq: Once | INTRAVENOUS | Status: AC
  Administered 2019-11-15: 0.4 mg via INTRAVENOUS

## 2019-11-16 ENCOUNTER — Ambulatory Visit (HOSPITAL_COMMUNITY): Payer: PRIVATE HEALTH INSURANCE | Attending: Internal Medicine

## 2019-11-16 ENCOUNTER — Ambulatory Visit (HOSPITAL_BASED_OUTPATIENT_CLINIC_OR_DEPARTMENT_OTHER): Payer: PRIVATE HEALTH INSURANCE

## 2019-11-16 DIAGNOSIS — Z7682 Awaiting organ transplant status: Secondary | ICD-10-CM | POA: Insufficient documentation

## 2019-11-16 LAB — MYOCARDIAL PERFUSION IMAGING
LV dias vol: 184 mL (ref 62–150)
LV sys vol: 88 mL
Peak HR: 86 {beats}/min
Rest HR: 77 {beats}/min
SDS: 2
SRS: 0
SSS: 2
TID: 0.88

## 2019-11-16 LAB — ECHOCARDIOGRAM COMPLETE
Area-P 1/2: 4.89 cm2
S' Lateral: 3.4 cm

## 2019-11-16 MED ORDER — PERFLUTREN LIPID MICROSPHERE
1.0000 mL | INTRAVENOUS | Status: AC | PRN
  Administered 2019-11-16: 3 mL via INTRAVENOUS

## 2019-11-16 MED ORDER — TECHNETIUM TC 99M TETROFOSMIN IV KIT
32.4000 | PACK | Freq: Once | INTRAVENOUS | Status: AC | PRN
  Administered 2019-11-16: 32.4 via INTRAVENOUS
  Filled 2019-11-16: qty 33

## 2019-11-27 ENCOUNTER — Other Ambulatory Visit: Payer: Self-pay | Admitting: Physician Assistant

## 2019-11-27 NOTE — Telephone Encounter (Signed)
Last visit: 07/20/2019 Next Visit: 01/18/2020 Labs: 07/18/2019 RBC 3.34, Hgb 10.8 Hct 32.2, BUN 52, Creat. 7.53, GFR 7, BUN/Creat Ratio 7, Chloride 90, Uric Acid 2.9  Current Dose per office note 07/20/2019: Uloric 40 mg 1/2 tablet daily DX: Chronic gout due to renal impairment   Okay to refill Uloric?

## 2020-01-05 NOTE — Progress Notes (Signed)
Office Visit Note  Patient: Richard Bush             Date of Birth: 1950/06/24           MRN: [id number]             PCP: Quentin Cornwall, MD Referring: Quentin Cornwall, MD Visit Date: 01/18/2020 Occupation: @GUAROCC @  Subjective:  Osteoarthritis , pain in hands.   History of Present Illness: Richard Bush is a 69 y.o. male with history of osteoarthritis and gout.  He has not had any gout flare in a long time.  He continues to take Uloric 40 mg on a daily basis.  He states he continues to have discomfort in his both hands and shoulders.  He has right ring trigger finger which is still bothersome.  He had right [place] surgery in the past which has been doing good.  He has been having some discomfort in his left CMC joint.  His replaced joints are doing well.  Activities of Daily Living:  Patient reports morning stiffness for 24 hours.   Patient Denies nocturnal pain.  Difficulty dressing/grooming: Denies Difficulty climbing stairs: Denies Difficulty getting out of chair: Denies Difficulty using hands for taps, buttons, cutlery, and/or writing: Denies  Review of Systems  Constitutional: Negative for fatigue.  HENT: Positive for mouth dryness.   Eyes: Negative for dryness.  Respiratory: Negative for shortness of breath.   Cardiovascular: Negative for swelling in legs/feet.  Gastrointestinal: Positive for constipation and diarrhea.  Endocrine: Positive for excessive thirst.  Genitourinary: Negative for difficulty urinating.  Musculoskeletal: Positive for arthralgias, joint pain, joint swelling and morning stiffness.  Skin: Negative for rash.  Allergic/Immunologic: Negative for susceptible to infections.  Neurological: Negative for numbness.  Hematological: Positive for bruising/bleeding tendency.  Psychiatric/Behavioral: Positive for sleep disturbance.    PMFS History:  Patient Active Problem List   Diagnosis Date Noted  . Difficult intravenous access 04/25/2019  . ESRD on  hemodialysis ([place])   . ESRD (end stage renal disease) ([place]) 01/21/2017  . Orthostatic dizziness   . Pressure injury of skin 08/04/2016  . 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 Anemia secondary to renal failure 08/04/2016  . Pulmonary nodule 08/03/2016  . ESRD on dialysis ([place]) 08/03/2016  . Right bundle branch block 08/03/2016  . History of gastroesophageal reflux (GERD) 07/13/2016  . Idiopathic chronic gout without tophus 07/03/2016  . Primary osteoarthritis of left knee 07/03/2016  . Stage 4 chronic kidney disease ([place]) 07/03/2016  . Osteoarthritis of hand 07/03/2016  . Hypertension 07/03/2016  . Secondary hyperparathyroidism ([place]) 07/03/2016  . Primary osteoarthritis of both feet 07/03/2016  . History of total hip replacement, right 07/03/2016  . Hx of total knee replacement, bilateral 07/03/2016    Past Medical History:  Diagnosis Date  . Acute respiratory failure with hypoxia ([place]) 02/19/2019  . Anemia   . Bleeding pseudoaneurysm of left brachiocephalic AV fistula, initial encounter ([place]) 01/05/2017  . Cataract   . Chronic right hip pain   . Difficult intravenous access 04/25/2019  . ESRD (end stage renal disease) on dialysis [facility])    dialysis M/W/F- [facility] (01/06/2017)  . GERD (gastroesophageal reflux disease)   . Gout   . High cholesterol   . Hypertension   . Neuropathy   . Osteoarthritis    "all over" (01/06/2017)  . Pleurisy   . Pneumonia 1980s X 1  . Pneumonia due to COVID-19 virus 02/20/2019    Family History  Problem Relation Age  of Onset  . Heart disease Mother   . Cancer Father   . Colon polyps Father   . Colon cancer Neg Hx   . Esophageal cancer Neg Hx   . Rectal cancer Neg Hx   . Stomach cancer Neg Hx    Past Surgical History:  Procedure Laterality Date  . A/V FISTULAGRAM Left 12/24/2016   Procedure: A/V FISTULAGRAM;  Surgeon: Waynetta Sandy, MD;  Location: [place] [facility];  Service: Cardiovascular;  Laterality:  Left;  . APPENDECTOMY  1963  . AV FISTULA PLACEMENT Left 08/04/2016   Procedure: ARTERIOVENOUS (AV) FISTULA CREATION/LEFT ARM;  Surgeon: Serafina Mitchell, MD;  Location: [facility];  Service: Vascular;  Laterality: Left;  . AV FISTULA REPAIR Left 01/06/2017   arm  . BILATERAL CARPAL TUNNEL RELEASE Bilateral 2006-2007   right-left  . CHOLECYSTECTOMY OPEN  1979  . COLONOSCOPY    . FINGER SURGERY Right 2006   "thumb; took out bone that conects to wrist and a piece of tendon; weaved tendon back in for mobility"  . FISTULA SUPERFICIALIZATION Left 09/24/2016   Procedure: FISTULA SUPERFICIALIZATION- LEFT ARM;  Surgeon: Serafina Mitchell, MD;  Location: [place];  Service: Vascular;  Laterality: Left;  . HAMMER TOE SURGERY Left 1981  . INSERTION OF DIALYSIS CATHETER Left 08/04/2016   Procedure: INSERTION OF DIALYSIS CATHETER;  Surgeon: Serafina Mitchell, MD;  Location: [place];  Service: Vascular;  Laterality: Left;  . JOINT REPLACEMENT    . LIGATION OF ARTERIOVENOUS  FISTULA Left 01/21/2017   Procedure: REPAIR OF ARTERIOVENOUS  FISTULA;  Surgeon: Angelia Mould, MD;  Location: [place];  Service: Vascular;  Laterality: Left;  . LUMBAR LAMINECTOMY/DECOMPRESSION MICRODISCECTOMY N/A 08/18/2012   Procedure: LUMBAR LAMINECTOMY/DECOMPRESSION MICRODISCECTOMY 3 LEVELS;  Surgeon: Hosie Spangle, MD;  Location: [place] [facility];  Service: Neurosurgery;  Laterality: N/A;  Lumbar Two through Five Laminectomy  . REVISON OF ARTERIOVENOUS FISTULA Left 01/06/2017   Procedure: REVISON OF ARTERIOVENOUS FISTULA LEFT ARM;  Surgeon: Serafina Mitchell, MD;  Location: [place];  Service: Vascular;  Laterality: Left;  . SHOULDER OPEN ROTATOR CUFF REPAIR Right 2001  . THROMBECTOMY AND REVISION OF ARTERIOVENTOUS (AV) GORETEX  GRAFT Left 01/17/2017   Procedure: REVISION OF ARTERIOVENTOUS (AV) FISTULA LEFT ARM;  Surgeon: Waynetta Sandy, MD;  Location: [place];  Service: Vascular;  Laterality: Left;  . TOTAL HIP ARTHROPLASTY Right  07/26/2013   Procedure: TOTAL HIP ARTHROPLASTY;  Surgeon: Kerin Salen, MD;  Location: [place];  Service: Orthopedics;  Laterality: Right;  . TOTAL KNEE ARTHROPLASTY Bilateral 2008-2010   right-left  . WRIST TENDON TRANSFER Bilateral 1972-1973   left-right   Social History   Social History Narrative  . Not on file   Immunization History  Administered Date(s) Administered  . Moderna SARS-COVID-2 Vaccination 03/21/2019, 05/03/2019, 12/18/2019  . Pneumococcal Polysaccharide-23 08/06/2016     Objective: Vital Signs: BP (!) 117/47 (BP Location: Right Arm, Patient Position: Sitting, Cuff Size: Normal)   Pulse (!) 49   Resp 16   Ht 5' 10.5" (1.791 m)   Wt 291 lb (132 kg)   BMI 41.16 kg/m    Physical Exam Vitals and nursing note reviewed.  Constitutional:      Appearance: He is well-developed.  HENT:     Head: Normocephalic and atraumatic.  Eyes:     Conjunctiva/sclera: Conjunctivae normal.     Pupils: Pupils are equal, round, and reactive to light.  Cardiovascular:     Rate and  Rhythm: Normal rate and regular rhythm.     Heart sounds: Normal heart sounds.  Pulmonary:     Effort: Pulmonary effort is normal.     Breath sounds: Normal breath sounds.  Abdominal:     General: Bowel sounds are normal.     Palpations: Abdomen is soft.  Musculoskeletal:     Cervical back: Normal range of motion and neck supple.  Skin:    General: Skin is warm and dry.     Capillary Refill: Capillary refill takes less than 2 seconds.  Neurological:     Mental Status: He is alert and oriented to person, place, and time.  Psychiatric:        Behavior: Behavior normal.      Musculoskeletal Exam: Good range of motion of the cervical spine.  Discomfort range of motion of the shoulder joints.  Elbow joints, wrist joints, MCPs and PIPs with good range of motion.  He has DIP and PIP thickening.  He has right ring trigger finger.  He has some discomfort over left CMC joint.  Hip joints were in good  range of motion.  Bilateral knee joints are replaced and had warmth on palpation.  He had no tenderness over ankles or MTPs.  CDAI Exam: CDAI Score: -- Patient Global: --; Provider Global: -- Swollen: --; Tender: -- Joint Exam 01/18/2020   No joint exam has been documented for this visit   There is currently no information documented on the homunculus. Go to the Rheumatology activity and complete the homunculus joint exam.  Investigation: No additional findings.  Imaging: No results found.  Recent Labs: Lab Results  Component Value Date   WBC 7.6 01/08/2020   HGB 11.3 (L) 01/08/2020   PLT 203 01/08/2020   NA 140 01/08/2020   K 5.5 (H) 01/08/2020   CL 96 01/08/2020   CO2 28 01/08/2020   GLUCOSE 80 01/08/2020   BUN 40 (H) 01/08/2020   CREATININE 6.52 (H) 01/08/2020   BILITOT 0.3 01/08/2020   ALKPHOS 87 01/08/2020   AST 23 01/08/2020   ALT 28 01/08/2020   PROT 7.4 01/08/2020   ALBUMIN 4.2 01/08/2020   CALCIUM 9.6 01/08/2020   GFRAA 9 (L) 01/08/2020    Speciality Comments: No specialty comments available.  Procedures:  Small Joint Inj: L thumb CMC on 01/18/2020 11:59 AM Indications: pain Details: 27 G needle, ultrasound-guided radial approach  Spinal Needle: No  Medications: 0.5 mL lidocaine 1 %; 10 mg triamcinolone acetonide 40 MG/ML Aspirate: 0 mL Outcome: tolerated well, no immediate complications Procedure, treatment alternatives, risks and benefits explained, specific risks discussed. Consent was given by the patient. Immediately prior to procedure a time out was called to verify the correct patient, procedure, equipment, support staff and site/side marked as required. Patient was prepped and draped in the usual sterile fashion.   Hand/UE Inj: R ring A1 for trigger finger on 01/18/2020 12:00 PM Indications: pain, tendon swelling and therapeutic Details: 27 G needle, ultrasound-guided volar approach Medications: 0.5 mL lidocaine 1 %; 10 mg triamcinolone  acetonide 40 MG/ML Aspirate: 0 mL Procedure, treatment alternatives, risks and benefits explained, specific risks discussed. Immediately prior to procedure a time out was called to verify the correct patient, procedure, equipment, support staff and site/side marked as required. Patient was prepped and draped in the usual sterile fashion.     Allergies: Cozaar [losartan potassium], Hylan g-f 20, Kayexalate [polystyrene], Cholestyramine, Losartan, Other, Ramipril, Sodium polystyrene sulfonate, Alacepril, Cholestyramine light, Pentosan polysulfate sodium, and Sulfa antibiotics  Assessment / Plan:     Visit Diagnoses: Chronic gout due to renal impairment of multiple sites without tophus - Uloric 40 mg 1/2 tablet daily, colchicine 0.6mg  1 tablet by mouth as needed. uric acid: 07/18/2019 2.9 -he has not had any gout flare in many years.  He has been taking Uloric 40 mg half a tablet daily which is controlling his uric acid levels in the desirable range.  Dietary modifications were discussed.  Plan: Uric acid  Medication monitoring encounter - Plan: CBC with Differential/Platelet, COMPLETE METABOLIC PANEL WITH GFR 1 week prior to the next visit.  Primary osteoarthritis of both hands-he continues to have pain and stiffness in his bilateral hands.  He had right [place] surgery in the past.  His left CMC has been painful.  Per his request left CMC joint was injected with cortisone as described above.  Postprocedure instructions were given.  Trigger finger, right ring finger-he requests injection in the left ring finger.  After informed consent was obtained the left trigger finger was injected with cortisone as described above.  He tolerated the procedure well.  Postprocedure instructions were given.  A splint was applied.  Chronic pain of both shoulders - followed by Dr. Mayer Camel..  He has had injections by Dr. Paulo Fruit which helped but short-lived.  Primary osteoarthritis of both feet-proper fitting shoes were  discussed.  Hx of total knee replacement, bilateral-he had warmth on palpation of bilateral knee joints.  He denies any discomfort.  History of total hip replacement, right-he had good range of motion.  Secondary renal hyperparathyroidism ([place])  History of renal disease-his GFR was 9 on 01/08/2020.  He is followed by nephrology.  History of gastroesophageal reflux (GERD)  Orders: Orders Placed This Encounter  Procedures  . CBC with Differential/Platelet  . COMPLETE METABOLIC PANEL WITH GFR  . Uric acid   No orders of the defined types were placed in this encounter.     Follow-Up Instructions: Return in about 6 months (around 07/18/2020) for Osteoarthritis, Gout.   Bo Merino, MD  Note - This record has been created using Editor, commissioning.  Chart creation errors have been sought, but may not always  have been located. Such creation errors do not reflect on  the standard of medical care.

## 2020-01-08 ENCOUNTER — Telehealth: Payer: Self-pay

## 2020-01-08 DIAGNOSIS — Z79899 Other long term (current) drug therapy: Secondary | ICD-10-CM

## 2020-01-08 DIAGNOSIS — M1A39X0 Chronic gout due to renal impairment, multiple sites, without tophus (tophi): Secondary | ICD-10-CM

## 2020-01-08 NOTE — Telephone Encounter (Signed)
Lab Orders released.  

## 2020-01-08 NOTE — Telephone Encounter (Signed)
Patient's wife [name] called requesting labwork orders be sent to [facility] in [place].  Ebbie will be going on Wednesday, 01/10/20.

## 2020-01-09 LAB — CMP14+EGFR
ALT: 28 IU/L (ref 0–44)
AST: 23 IU/L (ref 0–40)
Albumin/Globulin Ratio: 1.3 (ref 1.2–2.2)
Albumin: 4.2 g/dL (ref 3.8–4.8)
Alkaline Phosphatase: 87 IU/L (ref 44–121)
BUN/Creatinine Ratio: 6 — ABNORMAL LOW (ref 10–24)
BUN: 40 mg/dL — ABNORMAL HIGH (ref 8–27)
Bilirubin Total: 0.3 mg/dL (ref 0.0–1.2)
CO2: 28 mmol/L (ref 20–29)
Calcium: 9.6 mg/dL (ref 8.6–10.2)
Chloride: 96 mmol/L (ref 96–106)
Creatinine, Ser: 6.52 mg/dL — ABNORMAL HIGH (ref 0.76–1.27)
GFR calc Af Amer: 9 mL/min/{1.73_m2} — ABNORMAL LOW (ref >59)
GFR calc non Af Amer: 8 mL/min/{1.73_m2} — ABNORMAL LOW (ref >59)
Globulin, Total: 3.2 g/dL (ref 1.5–4.5)
Glucose: 80 mg/dL (ref 65–99)
Potassium: 5.5 mmol/L — ABNORMAL HIGH (ref 3.5–5.2)
Sodium: 140 mmol/L (ref 134–144)
Total Protein: 7.4 g/dL (ref 6.0–8.5)

## 2020-01-09 LAB — CBC WITH DIFFERENTIAL/PLATELET
Basophils Absolute: 0.1 10*3/uL (ref 0.0–0.2)
Basos: 1 %
EOS (ABSOLUTE): 0.4 10*3/uL (ref 0.0–0.4)
Eos: 6 %
Hematocrit: 34.1 % — ABNORMAL LOW (ref 37.5–51.0)
Hemoglobin: 11.3 g/dL — ABNORMAL LOW (ref 13.0–17.7)
Immature Grans (Abs): 0 10*3/uL (ref 0.0–0.1)
Immature Granulocytes: 0 %
Lymphocytes Absolute: 2.2 10*3/uL (ref 0.7–3.1)
Lymphs: 30 %
MCH: 31.7 pg (ref 26.6–33.0)
MCHC: 33.1 g/dL (ref 31.5–35.7)
MCV: 96 fL (ref 79–97)
Monocytes Absolute: 0.7 10*3/uL (ref 0.1–0.9)
Monocytes: 10 %
Neutrophils Absolute: 4.1 10*3/uL (ref 1.4–7.0)
Neutrophils: 53 %
Platelets: 203 10*3/uL (ref 150–450)
RBC: 3.57 x10E6/uL — ABNORMAL LOW (ref 4.14–5.80)
RDW: 14.1 % (ref 11.6–15.4)
WBC: 7.6 10*3/uL (ref 3.4–10.8)

## 2020-01-09 LAB — URIC ACID: Uric Acid: 2.9 mg/dL — ABNORMAL LOW (ref 3.8–8.4)

## 2020-01-18 ENCOUNTER — Ambulatory Visit: Payer: Self-pay

## 2020-01-18 ENCOUNTER — Ambulatory Visit: Payer: BLUE CROSS/BLUE SHIELD | Admitting: Rheumatology

## 2020-01-18 ENCOUNTER — Other Ambulatory Visit: Payer: Self-pay

## 2020-01-18 ENCOUNTER — Encounter: Payer: Self-pay | Admitting: Rheumatology

## 2020-01-18 VITALS — BP 117/47 | HR 49 | Resp 16 | Ht 70.5 in | Wt 291.0 lb

## 2020-01-18 DIAGNOSIS — M19042 Primary osteoarthritis, left hand: Secondary | ICD-10-CM

## 2020-01-18 DIAGNOSIS — M19072 Primary osteoarthritis, left ankle and foot: Secondary | ICD-10-CM

## 2020-01-18 DIAGNOSIS — Z96641 Presence of right artificial hip joint: Secondary | ICD-10-CM

## 2020-01-18 DIAGNOSIS — M25511 Pain in right shoulder: Secondary | ICD-10-CM

## 2020-01-18 DIAGNOSIS — M1A39X0 Chronic gout due to renal impairment, multiple sites, without tophus (tophi): Secondary | ICD-10-CM

## 2020-01-18 DIAGNOSIS — N2581 Secondary hyperparathyroidism of renal origin: Secondary | ICD-10-CM

## 2020-01-18 DIAGNOSIS — M19041 Primary osteoarthritis, right hand: Secondary | ICD-10-CM

## 2020-01-18 DIAGNOSIS — Z5181 Encounter for therapeutic drug level monitoring: Secondary | ICD-10-CM

## 2020-01-18 DIAGNOSIS — M25512 Pain in left shoulder: Secondary | ICD-10-CM

## 2020-01-18 DIAGNOSIS — M65341 Trigger finger, right ring finger: Secondary | ICD-10-CM

## 2020-01-18 DIAGNOSIS — G8929 Other chronic pain: Secondary | ICD-10-CM

## 2020-01-18 DIAGNOSIS — M19071 Primary osteoarthritis, right ankle and foot: Secondary | ICD-10-CM

## 2020-01-18 DIAGNOSIS — Z96653 Presence of artificial knee joint, bilateral: Secondary | ICD-10-CM

## 2020-01-18 DIAGNOSIS — Z87448 Personal history of other diseases of urinary system: Secondary | ICD-10-CM

## 2020-01-18 DIAGNOSIS — Z8719 Personal history of other diseases of the digestive system: Secondary | ICD-10-CM

## 2020-01-18 MED ORDER — LIDOCAINE HCL 1 % IJ SOLN
0.5000 mL | INTRAMUSCULAR | Status: AC | PRN
  Administered 2020-01-18: .5 mL

## 2020-01-18 MED ORDER — TRIAMCINOLONE ACETONIDE 40 MG/ML IJ SUSP
10.0000 mg | INTRAMUSCULAR | Status: AC | PRN
  Administered 2020-01-18: 10 mg

## 2020-01-18 MED ORDER — TRIAMCINOLONE ACETONIDE 40 MG/ML IJ SUSP
10.0000 mg | INTRAMUSCULAR | Status: AC | PRN
  Administered 2020-01-18: 10 mg via INTRA_ARTICULAR

## 2020-01-25 ENCOUNTER — Other Ambulatory Visit (HOSPITAL_COMMUNITY): Payer: Self-pay | Admitting: Neurology

## 2020-01-25 DIAGNOSIS — M542 Cervicalgia: Secondary | ICD-10-CM

## 2020-01-26 ENCOUNTER — Other Ambulatory Visit: Payer: Self-pay | Admitting: Neurology

## 2020-01-26 DIAGNOSIS — M542 Cervicalgia: Secondary | ICD-10-CM

## 2020-02-22 ENCOUNTER — Other Ambulatory Visit: Payer: Self-pay | Admitting: Physician Assistant

## 2020-02-23 NOTE — Telephone Encounter (Signed)
Last Visit: 01/18/2020 Next Visit:  6//2022 Labs: 01/08/2020, Anemia is stable and likely secondary to chronic kidney disease. GFR is extremely low-8 but is slightly trending up. Potassium is elevated-5.5. Uric acid is 2.9-stable. Continue on current dose of uloric Current Dose per office note 01/18/2020: Uloric 40 mg 1/2 tablet daily DX: Chronic gout due to renal impairment of multiple sites without tophus   Okay to refill Uloric?

## 2020-04-16 ENCOUNTER — Ambulatory Visit
Admission: RE | Admit: 2020-04-16 | Discharge: 2020-04-16 | Disposition: A | Discharge status: Home / Self Care | Payer: Medicare Other | Source: Ambulatory Visit | Attending: Neurology | Admitting: Neurology

## 2020-04-16 DIAGNOSIS — M542 Cervicalgia: Secondary | ICD-10-CM

## 2020-05-28 ENCOUNTER — Other Ambulatory Visit: Payer: Self-pay | Admitting: Physician Assistant

## 2020-05-28 NOTE — Telephone Encounter (Addendum)
Next Visit: 07/18/2020  Last Visit: 01/18/2020  Last Fill: 02/23/2020  DX: Chronic gout due to renal impairment of multiple sites without tophus   Current Dose per office note 01/18/2020,  Uloric 40 mg 1/2 tablet daily  Labs: 03/19/1020, RBC 3..68, Hemoglobin 12.4, Hematocrit 39.2, MCV 106.5, MCH 33.7, MCHC 31.6, RDW 15.0, Potassium 5.7, Chloride 94, BUN 58, Creatinine 9.5, Anion Gap 21, eGFR 5,   Uric Acid 2.9 01/08/2020  Okay to refill Uloric?

## 2020-07-04 NOTE — Progress Notes (Signed)
Office Visit Note  Patient: Richard Bush             Date of Birth: 1950/05/01           MRN: [id number]             PCP: Quentin Cornwall, MD Referring: Quentin Cornwall, MD Visit Date: 07/18/2020 Occupation: @GUAROCC @  Subjective:  Medication monitoring   History of Present Illness: Richard Bush is a 70 y.o. male with history of gout and osteoarthritis.  He is taking uloric 40 mg 1/2 tablet by mouth daily and colchicine 0.6 mg 1 tablet daily as needed during flares.  He has not had recent gout flares.  He has not needed to take colchicine since his last visit.  He continues to go to dialysis 3 days a week.  According to the patient he was advised to increase his protein level so he has been eating red meat recently but he has not noticed any signs or symptoms of a gout flare.  Patient reports that he continues to have neck pain.  He had an MRI performed on 04/16/2020 of the cervical spine.  He went to physical therapy with minimal improvement in his pain and stiffness.  He is scheduled for nerve conduction study next week due to experiencing numbness in the left fourth and fifth digits.  According to the patient in 1 week ago he tore his right biceps tendon while trying to start the weed wacker.  He was evaluated by Dr. Mayer Camel 2 days ago.  He will not require surgery.  His discomfort is gradually improving.  He continues to have symptoms of neuropathy in both feet.  He has not noticed any issues with balance.  No recent falls or fractures. He states both knee replacements are doing well.        Activities of Daily Living:  Patient reports morning stiffness for 1 hour. Patient Reports nocturnal pain.  Difficulty dressing/grooming: Denies Difficulty climbing stairs: Denies Difficulty getting out of chair: Denies Difficulty using hands for taps, buttons, cutlery, and/or writing: Reports  Review of Systems  Constitutional: Negative for fatigue.  HENT: Positive for mouth dryness. Negative for mouth  sores and nose dryness.   Eyes: Negative for pain, itching and dryness.  Respiratory: Negative for shortness of breath and difficulty breathing.   Cardiovascular: Negative for chest pain and palpitations.  Gastrointestinal: Negative for blood in stool, constipation and diarrhea.  Endocrine: Negative for increased urination.  Genitourinary: Negative for difficulty urinating.  Musculoskeletal: Positive for arthralgias, joint pain, joint swelling, myalgias, muscle weakness, morning stiffness, muscle tenderness and myalgias.  Skin: Positive for redness. Negative for color change and rash.  Allergic/Immunologic: Negative for susceptible to infections.  Neurological: Positive for numbness and headaches. Negative for dizziness, memory loss and weakness.  Hematological: Positive for bruising/bleeding tendency.  Psychiatric/Behavioral: Negative for confusion.    PMFS History:  Patient Active Problem List   Diagnosis Date Noted  . Difficult intravenous access 04/25/2019  . ESRD on hemodialysis ([place])   . ESRD (end stage renal disease) ([place]) 01/21/2017  . Orthostatic dizziness   . Pressure injury of skin 08/04/2016  . 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 Anemia secondary to renal failure 08/04/2016  . Pulmonary nodule 08/03/2016  . ESRD on dialysis ([place]) 08/03/2016  . Right bundle branch block 08/03/2016  . History of gastroesophageal reflux (GERD) 07/13/2016  . Idiopathic chronic gout without tophus 07/03/2016  . Primary osteoarthritis of left knee 07/03/2016  .  Stage 4 chronic kidney disease ([place]) 07/03/2016  . Osteoarthritis of hand 07/03/2016  . Hypertension 07/03/2016  . Secondary hyperparathyroidism ([place]) 07/03/2016  . Primary osteoarthritis of both feet 07/03/2016  . History of total hip replacement, right 07/03/2016  . Hx of total knee replacement, bilateral 07/03/2016    Past Medical History:  Diagnosis Date  . Acute respiratory failure with hypoxia ([place])  02/19/2019  . Anemia   . Bleeding pseudoaneurysm of left brachiocephalic AV fistula, initial encounter ([place]) 01/05/2017  . Cataract   . Chronic right hip pain   . Difficult intravenous access 04/25/2019  . ESRD (end stage renal disease) on dialysis [facility])    dialysis M/W/F- [facility] (01/06/2017)  . GERD (gastroesophageal reflux disease)   . Gout   . High cholesterol   . Hypertension   . Neuropathy   . Osteoarthritis    "all over" (01/06/2017)  . Pleurisy   . Pneumonia 1980s X 1  . Pneumonia due to COVID-19 virus 02/20/2019    Family History  Problem Relation Age of Onset  . Heart disease Mother   . Cancer Father   . Colon polyps Father   . Colon cancer Neg Hx   . Esophageal cancer Neg Hx   . Rectal cancer Neg Hx   . Stomach cancer Neg Hx    Past Surgical History:  Procedure Laterality Date  . A/V FISTULAGRAM Left 12/24/2016   Procedure: A/V FISTULAGRAM;  Surgeon: Waynetta Sandy, MD;  Location: [facility];  Service: Cardiovascular;  Laterality: Left;  . APPENDECTOMY  1963  . AV FISTULA PLACEMENT Left 08/04/2016   Procedure: ARTERIOVENOUS (AV) FISTULA CREATION/LEFT ARM;  Surgeon: Serafina Mitchell, MD;  Location: [facility];  Service: Vascular;  Laterality: Left;  . AV FISTULA REPAIR Left 01/06/2017   arm  . BILATERAL CARPAL TUNNEL RELEASE Bilateral 2006-2007   right-left  . CHOLECYSTECTOMY OPEN  1979  . COLONOSCOPY    . FINGER SURGERY Right 2006   "thumb; took out bone that conects to wrist and a piece of tendon; weaved tendon back in for mobility"  . FISTULA SUPERFICIALIZATION Left 09/24/2016   Procedure: FISTULA SUPERFICIALIZATION- LEFT ARM;  Surgeon: Serafina Mitchell, MD;  Location: [facility];  Service: Vascular;  Laterality: Left;  . HAMMER TOE SURGERY Left 1981  . INSERTION OF DIALYSIS CATHETER Left 08/04/2016   Procedure: INSERTION OF DIALYSIS CATHETER;  Surgeon: Serafina Mitchell, MD;  Location: [facility];  Service: Vascular;  Laterality: Left;  . JOINT  REPLACEMENT    . LIGATION OF ARTERIOVENOUS  FISTULA Left 01/21/2017   Procedure: REPAIR OF ARTERIOVENOUS  FISTULA;  Surgeon: Angelia Mould, MD;  Location: [facility];  Service: Vascular;  Laterality: Left;  . LUMBAR LAMINECTOMY/DECOMPRESSION MICRODISCECTOMY N/A 08/18/2012   Procedure: LUMBAR LAMINECTOMY/DECOMPRESSION MICRODISCECTOMY 3 LEVELS;  Surgeon: Hosie Spangle, MD;  Location: [facility];  Service: Neurosurgery;  Laterality: N/A;  Lumbar Two through Five Laminectomy  . REVISON OF ARTERIOVENOUS FISTULA Left 01/06/2017   Procedure: REVISON OF ARTERIOVENOUS FISTULA LEFT ARM;  Surgeon: Serafina Mitchell, MD;  Location: [facility];  Service: Vascular;  Laterality: Left;  . SHOULDER OPEN ROTATOR CUFF REPAIR Right 2001  . THROMBECTOMY AND REVISION OF ARTERIOVENTOUS (AV) GORETEX  GRAFT Left 01/17/2017   Procedure: REVISION OF ARTERIOVENTOUS (AV) FISTULA LEFT ARM;  Surgeon: Waynetta Sandy, MD;  Location: [facility];  Service: Vascular;  Laterality: Left;  . TOTAL HIP ARTHROPLASTY Right 07/26/2013   Procedure: TOTAL HIP ARTHROPLASTY;  Surgeon: Kerin Salen, MD;  Location: [facility];  Service: Orthopedics;  Laterality: Right;  . TOTAL KNEE ARTHROPLASTY Bilateral 2008-2010   right-left  . WRIST TENDON TRANSFER Bilateral 1972-1973   left-right   Social History   Social History Narrative  . Not on file   Immunization History  Administered Date(s) Administered  . Moderna Sars-Covid-2 Vaccination 03/21/2019, 05/03/2019, 12/18/2019  . Pneumococcal Polysaccharide-23 08/06/2016     Objective: Vital Signs: BP 113/68 (BP Location: Right Wrist, Patient Position: Sitting, Cuff Size: Normal)   Pulse 74   Ht 5' 10.5" (1.791 m)   Wt (!) 302 lb 12.8 oz (137.3 kg)   BMI 42.83 kg/m    Physical Exam Vitals and nursing note reviewed.  Constitutional:      Appearance: He is well-developed.  HENT:     Head: Normocephalic and atraumatic.  Eyes:     Conjunctiva/sclera: Conjunctivae normal.      Pupils: Pupils are equal, round, and reactive to light.  Pulmonary:     Effort: Pulmonary effort is normal.  Abdominal:     Palpations: Abdomen is soft.  Musculoskeletal:     Cervical back: Normal range of motion and neck supple.  Skin:    General: Skin is warm and dry.     Capillary Refill: Capillary refill takes less than 2 seconds.  Neurological:     Mental Status: He is alert and oriented to person, place, and time.  Psychiatric:        Behavior: Behavior normal.      Musculoskeletal Exam:    CDAI Exam: CDAI Score: -- Patient Global: --; Provider Global: -- Swollen: --; Tender: -- Joint Exam 07/18/2020   No joint exam has been documented for this visit   There is currently no information documented on the homunculus. Go to the Rheumatology activity and complete the homunculus joint exam.  Investigation: No additional findings.  Imaging: No results found.  Recent Labs: Lab Results  Component Value Date   WBC 7.6 01/08/2020   HGB 11.3 (L) 01/08/2020   PLT 203 01/08/2020   NA 140 01/08/2020   K 5.5 (H) 01/08/2020   CL 96 01/08/2020   CO2 28 01/08/2020   GLUCOSE 80 01/08/2020   BUN 40 (H) 01/08/2020   CREATININE 6.52 (H) 01/08/2020   BILITOT 0.3 01/08/2020   ALKPHOS 87 01/08/2020   AST 23 01/08/2020   ALT 28 01/08/2020   PROT 7.4 01/08/2020   ALBUMIN 4.2 01/08/2020   CALCIUM 9.6 01/08/2020   GFRAA 9 (L) 01/08/2020    Speciality Comments: No specialty comments available.  Procedures:  No procedures performed Allergies: Cozaar [losartan potassium], Hylan g-f 20, Kayexalate [polystyrene], Cholestyramine, Losartan, Other, Ramipril, Sodium polystyrene sulfonate, Alacepril, Cholestyramine light, Pentosan polysulfate sodium, and Sulfa antibiotics   Assessment / Plan:     Visit Diagnoses: Chronic gout due to renal impairment of multiple sites without tophus -He has not had any signs or symptoms of a gout flare recently.  He is clinically doing well taking  Uloric 40 mg 1/2 tablet daily and colchicine 0.6mg  1 tablet by mouth as needed during flares.  He has not needed to take colchicine since his last office visit.  He continues to go for dialysis on Mondays, Wednesdays, and Fridays.  His uric acid was 4.3 on 07/16/20, which is within the desirable range.  We discussed the importance of avoiding a purine rich diet.  According to the patient he was advised to increase his protein intake by his nephrologist.  We discussed trying to avoid red meat, shellfish, and alcohol use especially beer.  He was given a handout of information for dietary recommendations.  He will remain on Uloric and colchicine as prescribed.  He does not need any refills at this time.  He was advised to notify us if he develops signs or symptoms of a gout flare.  He will follow-up in the office in 6 months.  Medication monitoring encounter: His uric acid was 4.3 on 07/16/20, which is within the desirable range.   He continues to go to dialysis Monday, Wednesday, and Fridays.   Primary osteoarthritis of both hands: He has PIP and DIP thickening consistent with osteoarthritis of both hands.  He experiences occasional pain and stiffness in both hands.  He has tenderness palpation over the right second MCP joint but no synovitis was noted.  Trigger finger, right ring finger: He has occasional locking and tenderness.    Chronic pain of both shoulders - Followed by Dr. Mayer Camel.  He has good range of motion of both shoulder joints on examination today.  According to the patient 1 week ago he was trying to start a weed [employer] and tore his right biceps tendon.  He was evaluated by Dr. Mayer Camel 2 days ago.  He is not a good surgical candidate.  His discomfort has gradually been improving.  Primary osteoarthritis of both feet: He has chronic pain in both feet secondary to neuropathy.  He has not noticed any difficulty with balance or recent falls.  He is wearing proper fitting shoes.  Hx of total knee  replacement, bilateral: Doing well.  She has good ROM with no discomfort.   History of total hip replacement, right: Doing well.  She has good ROM with no discomfort.   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 He continues to go to dialysis 3 days a week.   Orders: No orders of the defined types were placed in this encounter.  No orders of the defined types were placed in this encounter.    Follow-Up Instructions: Return in about 6 months (around 01/17/2021) for Gout, Osteoarthritis.   Ofilia Neas, PA-C  Note - This record has been created using Dragon software.  Chart creation errors have been sought, but may not always  have been located. Such creation errors do not reflect on  the standard of medical care.

## 2020-07-16 ENCOUNTER — Other Ambulatory Visit: Payer: Self-pay | Admitting: *Deleted

## 2020-07-16 ENCOUNTER — Telehealth: Payer: Self-pay

## 2020-07-16 DIAGNOSIS — M1A39X0 Chronic gout due to renal impairment, multiple sites, without tophus (tophi): Secondary | ICD-10-CM

## 2020-07-16 NOTE — Telephone Encounter (Signed)
Lab Order released.  

## 2020-07-16 NOTE — Telephone Encounter (Signed)
Richard Bush left a voicemail on 07/15/20 at 9:20 am requesting labwork orders be sent to [facility] in [place].

## 2020-07-16 NOTE — Telephone Encounter (Signed)
Left message to advise patient

## 2020-07-17 LAB — URIC ACID: Uric Acid, Serum: 4.3 mg/dL (ref 4.0–8.0)

## 2020-07-17 NOTE — Progress Notes (Signed)
Uric acid is normal.

## 2020-07-18 ENCOUNTER — Encounter: Payer: Self-pay | Admitting: Physician Assistant

## 2020-07-18 ENCOUNTER — Ambulatory Visit: Payer: Medicare Other | Admitting: Physician Assistant

## 2020-07-18 ENCOUNTER — Other Ambulatory Visit: Payer: Self-pay

## 2020-07-18 VITALS — BP 113/68 | HR 74 | Ht 70.5 in | Wt 302.8 lb

## 2020-07-18 DIAGNOSIS — Z5181 Encounter for therapeutic drug level monitoring: Secondary | ICD-10-CM

## 2020-07-18 DIAGNOSIS — Z96641 Presence of right artificial hip joint: Secondary | ICD-10-CM

## 2020-07-18 DIAGNOSIS — M19041 Primary osteoarthritis, right hand: Secondary | ICD-10-CM

## 2020-07-18 DIAGNOSIS — N2581 Secondary hyperparathyroidism of renal origin: Secondary | ICD-10-CM

## 2020-07-18 DIAGNOSIS — G8929 Other chronic pain: Secondary | ICD-10-CM

## 2020-07-18 DIAGNOSIS — M25512 Pain in left shoulder: Secondary | ICD-10-CM

## 2020-07-18 DIAGNOSIS — M65341 Trigger finger, right ring finger: Secondary | ICD-10-CM

## 2020-07-18 DIAGNOSIS — M25511 Pain in right shoulder: Secondary | ICD-10-CM

## 2020-07-18 DIAGNOSIS — Z87448 Personal history of other diseases of urinary system: Secondary | ICD-10-CM

## 2020-07-18 DIAGNOSIS — M19042 Primary osteoarthritis, left hand: Secondary | ICD-10-CM

## 2020-07-18 DIAGNOSIS — M1A39X0 Chronic gout due to renal impairment, multiple sites, without tophus (tophi): Secondary | ICD-10-CM | POA: Diagnosis not present

## 2020-07-18 DIAGNOSIS — Z96653 Presence of artificial knee joint, bilateral: Secondary | ICD-10-CM

## 2020-07-18 DIAGNOSIS — M19072 Primary osteoarthritis, left ankle and foot: Secondary | ICD-10-CM

## 2020-07-18 DIAGNOSIS — M19071 Primary osteoarthritis, right ankle and foot: Secondary | ICD-10-CM

## 2020-07-18 DIAGNOSIS — Z8719 Personal history of other diseases of the digestive system: Secondary | ICD-10-CM

## 2020-07-18 NOTE — Patient Instructions (Signed)

## 2020-07-24 ENCOUNTER — Other Ambulatory Visit: Payer: Self-pay | Admitting: Rheumatology

## 2020-07-24 NOTE — Telephone Encounter (Signed)
Next Visit: 01/21/2021  Last Visit: 07/18/2020  Last Fill: 11/15/2018  DX: Chronic gout due to renal impairment of multiple sites without tophus   Current Dose per office note 07/18/2020: colchicine 0.6mg  1 tablet by mouth as needed during flares  Labs: 03/19/2020 Potassium 5.7, Chloride 94, BUN 58, Creatinine 9.5, GFR 5 RBC 3.68, Hgb 12.4, Hct 39.2, MCV 106.5, MCH 33.7, MCHC 31.6 RDW 15  Okay to refill Colcrys?

## 2020-08-23 ENCOUNTER — Telehealth: Payer: Self-pay | Admitting: *Deleted

## 2020-08-23 ENCOUNTER — Other Ambulatory Visit: Payer: Self-pay | Admitting: Physician Assistant

## 2020-08-23 NOTE — Telephone Encounter (Signed)
Received notification from [facility] regarding a prior authorization for  Uloric . Authorization has been APPROVED from 05/25/2020 to 08/23/2021.

## 2020-08-23 NOTE — Telephone Encounter (Signed)
Next Visit: 01/21/2021   Last Visit: 07/18/2020   Last Fill: 05/28/2020   DX: Chronic gout due to renal impairment of multiple sites without tophus    Current Dose per office note 07/18/2020: Uloric 40 mg 1/2 tablet daily   Labs: 03/19/2020 Potassium 5.7, Chloride 94, BUN 58, Creatinine 9.5, GFR 5 RBC 3.68, Hgb 12.4, Hct 39.2, MCV 106.5, MCH 33.7, MCHC 31.6 RDW 15   Okay to refill Uloric?

## 2020-08-23 NOTE — Telephone Encounter (Signed)
Submitted a Prior Authorization request to [facility] for  Uloric  via CoverMyMeds. Will update once we receive a response.

## 2020-10-08 ENCOUNTER — Other Ambulatory Visit (HOSPITAL_COMMUNITY): Payer: Self-pay | Admitting: Neurology

## 2020-10-08 DIAGNOSIS — C412 Malignant neoplasm of vertebral column: Secondary | ICD-10-CM

## 2020-10-11 ENCOUNTER — Other Ambulatory Visit: Payer: Self-pay | Admitting: Neurology

## 2020-10-11 DIAGNOSIS — C412 Malignant neoplasm of vertebral column: Secondary | ICD-10-CM

## 2020-10-28 ENCOUNTER — Other Ambulatory Visit: Payer: Self-pay | Admitting: Orthopedic Surgery

## 2020-11-07 ENCOUNTER — Ambulatory Visit
Admission: RE | Admit: 2020-11-07 | Discharge: 2020-11-07 | Disposition: A | Discharge status: Home / Self Care | Payer: Medicare Other | Source: Ambulatory Visit | Attending: Neurology | Admitting: Neurology

## 2020-11-07 ENCOUNTER — Other Ambulatory Visit: Payer: Self-pay

## 2020-11-07 ENCOUNTER — Other Ambulatory Visit: Payer: Self-pay | Admitting: Neurology

## 2020-11-07 DIAGNOSIS — C412 Malignant neoplasm of vertebral column: Secondary | ICD-10-CM

## 2020-11-28 ENCOUNTER — Other Ambulatory Visit: Payer: Self-pay | Admitting: Physician Assistant

## 2020-11-28 DIAGNOSIS — Z5181 Encounter for therapeutic drug level monitoring: Secondary | ICD-10-CM

## 2020-11-28 DIAGNOSIS — M1A39X0 Chronic gout due to renal impairment, multiple sites, without tophus (tophi): Secondary | ICD-10-CM

## 2020-11-28 NOTE — Telephone Encounter (Signed)
Next Visit: 01/21/2021   Last Visit: 07/18/2020   Last Fill: 05/28/2020   DX: Chronic gout due to renal impairment of multiple sites without tophus    Current Dose per office note 07/18/2020: Uloric 40 mg 1/2 tablet daily    Labs: 03/19/2020 Potassium 5.7, Chloride 94, BUN 58, Creatinine 9.5, GFR 5 RBC 3.68, Hgb 12.4, Hct 39.2, MCV 106.5, MCH 33.7, MCHC 31.6 RDW 15  Spoke with patient's wife and advised Zayvion is due to update labs. Patient will update them today in [place] at [facility] to refill Uloric?

## 2020-11-29 LAB — CBC WITH DIFFERENTIAL/PLATELET
Basophils Absolute: 0.1 10*3/uL (ref 0.0–0.2)
Basos: 1 %
EOS (ABSOLUTE): 0.2 10*3/uL (ref 0.0–0.4)
Eos: 3 %
Hematocrit: 39 % (ref 37.5–51.0)
Hemoglobin: 12.9 g/dL — ABNORMAL LOW (ref 13.0–17.7)
Immature Grans (Abs): 0 10*3/uL (ref 0.0–0.1)
Immature Granulocytes: 0 %
Lymphocytes Absolute: 2.3 10*3/uL (ref 0.7–3.1)
Lymphs: 30 %
MCH: 33.2 pg — ABNORMAL HIGH (ref 26.6–33.0)
MCHC: 33.1 g/dL (ref 31.5–35.7)
MCV: 101 fL — ABNORMAL HIGH (ref 79–97)
Monocytes Absolute: 0.6 10*3/uL (ref 0.1–0.9)
Monocytes: 8 %
Neutrophils Absolute: 4.5 10*3/uL (ref 1.4–7.0)
Neutrophils: 58 %
Platelets: 243 10*3/uL (ref 150–450)
RBC: 3.88 x10E6/uL — ABNORMAL LOW (ref 4.14–5.80)
RDW: 13.1 % (ref 11.6–15.4)
WBC: 7.7 10*3/uL (ref 3.4–10.8)

## 2020-11-29 LAB — CMP14+EGFR
ALT: 22 IU/L (ref 0–44)
AST: 21 IU/L (ref 0–40)
Albumin/Globulin Ratio: 1.6 (ref 1.2–2.2)
Albumin: 4.6 g/dL (ref 3.7–4.7)
Alkaline Phosphatase: 93 IU/L (ref 44–121)
BUN/Creatinine Ratio: 6 — ABNORMAL LOW (ref 10–24)
BUN: 45 mg/dL — ABNORMAL HIGH (ref 8–27)
Bilirubin Total: 0.3 mg/dL (ref 0.0–1.2)
CO2: 23 mmol/L (ref 20–29)
Calcium: 9.7 mg/dL (ref 8.6–10.2)
Chloride: 93 mmol/L — ABNORMAL LOW (ref 96–106)
Creatinine, Ser: 7.41 mg/dL — ABNORMAL HIGH (ref 0.76–1.27)
Globulin, Total: 2.9 g/dL (ref 1.5–4.5)
Glucose: 128 mg/dL — ABNORMAL HIGH (ref 70–99)
Potassium: 4.9 mmol/L (ref 3.5–5.2)
Sodium: 137 mmol/L (ref 134–144)
Total Protein: 7.5 g/dL (ref 6.0–8.5)
eGFR: 7 mL/min/{1.73_m2} — ABNORMAL LOW (ref >59)

## 2020-11-29 LAB — URIC ACID: Uric Acid: 3.8 mg/dL (ref 3.8–8.4)

## 2020-11-29 NOTE — Progress Notes (Signed)
CBC stable.  Creatinine 7.41 and GFR 7.  Please notify the patient and forward to nephrologist.  Uric acid is within the desirable range.

## 2020-12-04 NOTE — Progress Notes (Signed)
Surgical Instructions    Your procedure is scheduled on Wednesday October 26th.  Report to [facility] Main Entrance "A" at 8:15 A.M., then check in with the Admitting office.  Call this number if you have problems the morning of surgery:  [phone]   If you have any questions prior to your surgery date call [phone]: Open Monday-Friday 8am-4pm    Remember:  Do not eat after midnight the night before your surgery  You may drink clear liquids until 8:15 the morning of your surgery.   Clear liquids allowed are: Water, Non-Citrus Juices (without pulp), Carbonated Beverages, Clear Tea, Black Coffee ONLY (NO MILK, CREAM OR POWDERED CREAMER of any kind), and Gatorade    Take these medicines the morning of surgery with A SIP OF WATER acetaminophen (TYLENOL) 650 MG CR tablet cinacalcet (SENSIPAR) 30 MG tablet omeprazole (PRILOSEC) 20 MG capsule    As of today, STOP taking any Mobic, Aspirin (unless otherwise instructed by your surgeon) Aleve, Naproxen, Ibuprofen, Motrin, Advil, Goody's, BC's, all herbal medications, fish oil, and all vitamins.  After your COVID test   You are not required to quarantine however you are required to wear a well-fitting mask when you are out and around people not in your household.  If your mask becomes wet or soiled, replace with a new one.  Wash your hands often with soap and water for 20 seconds or clean your hands with an alcohol-based hand sanitizer that contains at least 60% alcohol.  Do not share personal items.  Notify your provider: if you are in close contact with someone who has COVID  or if you develop a fever of 100.4 or greater, sneezing, cough, sore throat, shortness of breath or body aches.             Do not wear jewelry  Do not wear lotions, powders, cologne, or deodorant. Do not shave 48 hours prior to surgery.  Men may shave face and neck. Do not bring valuables to the hospital. DO Not wear nail polish, gel polish,  artificial nails, or any other type of covering on natural nails including finger and toenails. If patients have artificial nails, gel coating, etc. that need to be removed by a nail salon, please have this removed prior to surgery or surgery may need to be canceled/delayed if the surgeon/ anesthesia feels like the patient is unable to be adequately monitored.             [facility] is not responsible for any belongings or valuables.  Do NOT Smoke (Tobacco/Vaping)  24 hours prior to your procedure  If you use a CPAP at night, you may bring your mask for your overnight stay.   Contacts, glasses, hearing aids, dentures or partials may not be worn into surgery, please bring cases for these belongings   For patients admitted to the hospital, discharge time will be determined by your treatment team.   Patients discharged the day of surgery will not be allowed to drive home, and someone needs to stay with them for 24 hours.  NO VISITORS WILL BE ALLOWED IN PRE-OP WHERE PATIENTS ARE PREPPED FOR SURGERY.  ONLY 1 SUPPORT PERSON MAY BE PRESENT IN THE WAITING ROOM WHILE YOU ARE IN SURGERY.  IF YOU ARE TO BE ADMITTED, ONCE YOU ARE IN YOUR ROOM YOU WILL BE ALLOWED TWO (2) VISITORS. 1 (ONE) VISITOR MAY STAY OVERNIGHT BUT MUST ARRIVE TO THE ROOM BY 8pm.  Minor children may have two parents present. Special  consideration for safety and communication needs will be reviewed on a case by case basis.  Special instructions:    Oral Hygiene is also important to reduce your risk of infection.  Remember - BRUSH YOUR TEETH THE MORNING OF SURGERY WITH YOUR REGULAR TOOTHPASTE   [facility]- Preparing For Surgery  Before surgery, you can play an important role. Because skin is not sterile, your skin needs to be as free of germs as possible. You can reduce the number of germs on your skin by washing with CHG (chlorahexidine gluconate) Soap before surgery.  CHG is an antiseptic cleaner which kills germs and bonds with the  skin to continue killing germs even after washing.     Please do not use if you have an allergy to CHG or antibacterial soaps. If your skin becomes reddened/irritated stop using the CHG.  Do not shave (including legs and underarms) for at least 48 hours prior to first CHG shower. It is OK to shave your face.  Please follow these instructions carefully.     Shower the NIGHT BEFORE SURGERY and the MORNING OF SURGERY with CHG Soap.   If you chose to wash your hair, wash your hair first as usual with your normal shampoo. After you shampoo, rinse your hair and body thoroughly to remove the shampoo.  Then [employer] and genitals (private parts) with your normal soap and rinse thoroughly to remove soap.  After that Use CHG Soap as you would any other liquid soap. You can apply CHG directly to the skin and wash gently with a scrungie or a clean washcloth.   Apply the CHG Soap to your body ONLY FROM THE NECK DOWN.  Do not use on open wounds or open sores. Avoid contact with your eyes, ears, mouth and genitals (private parts). Wash Face and genitals (private parts)  with your normal soap.   Wash thoroughly, paying special attention to the area where your surgery will be performed.  Thoroughly rinse your body with warm water from the neck down.  DO NOT shower/wash with your normal soap after using and rinsing off the CHG Soap.  Pat yourself dry with a CLEAN TOWEL.  Wear CLEAN PAJAMAS to bed the night before surgery  Place CLEAN SHEETS on your bed the night before your surgery  DO NOT SLEEP WITH PETS.   Day of Surgery:  Take a shower with CHG soap. Wear Clean/Comfortable clothing the morning of surgery Do not apply any deodorants/lotions.   Remember to brush your teeth WITH YOUR REGULAR TOOTHPASTE.   Please read over the following fact sheets that you were given.

## 2020-12-05 ENCOUNTER — Other Ambulatory Visit: Payer: Self-pay | Admitting: Orthopedic Surgery

## 2020-12-05 ENCOUNTER — Encounter (HOSPITAL_COMMUNITY): Payer: Self-pay

## 2020-12-05 ENCOUNTER — Other Ambulatory Visit: Payer: Self-pay

## 2020-12-05 ENCOUNTER — Encounter (HOSPITAL_COMMUNITY)
Admission: RE | Admit: 2020-12-05 | Discharge: 2020-12-05 | Disposition: A | Discharge status: Home / Self Care | Payer: Medicare Other | Source: Ambulatory Visit | Attending: Orthopedic Surgery | Admitting: Orthopedic Surgery

## 2020-12-05 VITALS — BP 107/57 | HR 83 | Temp 98.2°F | Resp 18 | Ht 71.0 in | Wt 304.9 lb

## 2020-12-05 DIAGNOSIS — Z01818 Encounter for other preprocedural examination: Secondary | ICD-10-CM

## 2020-12-05 HISTORY — DX: Sleep apnea, unspecified: G47.30

## 2020-12-05 LAB — SURGICAL PCR SCREEN
MRSA, PCR: NEGATIVE
Staphylococcus aureus: NEGATIVE

## 2020-12-05 NOTE — Progress Notes (Signed)
PCP - Darlina Sicilian MD Cardiologist -  None Nephrologist - Kevan Rosebush MD  PPM/ICD - denies  Device Orders -  Rep Notified -   Chest x-ray - 02/19/19 EKG - 12/05/20 Stress Test - 11/16/19 ECHO - 11/16/19 Cardiac Cath - none  Sleep Study - 06/14/19 CPAP - yes   Fasting Blood Sugar - n/a Checks Blood Sugar _____ times a day  Blood Thinner Instructions:n/a  Aspirin Instructions:n/a  ERAS Protcol - yes PRE-SURGERY Ensure or G2- no  COVID TEST- n/a -ambulatory surgery.    Anesthesia review: no  Patient denies shortness of breath, fever, cough and chest pain at PAT appointment   All instructions explained to the patient, with a verbal understanding of the material. Patient agrees to go over the instructions while at home for a better understanding. Patient also instructed to self quarantine after being tested for COVID-19. The opportunity to ask questions was provided.

## 2020-12-05 NOTE — Progress Notes (Signed)
Surgical Instructions    Your procedure is scheduled on Friday October 28.  Report to [facility] Main Entrance "A" at 5:30 A.M., then check in with the Admitting office.  Call this number if you have problems the morning of surgery:  [phone]   If you have any questions prior to your surgery date call [phone]: Open Monday-Friday 8am-4pm    Remember:  Do not eat after midnight the night before your surgery  You may drink clear liquids until 8:15 the morning of your surgery.   Clear liquids allowed are: Water, Non-Citrus Juices (without pulp), Carbonated Beverages, Clear Tea, Black Coffee ONLY (NO MILK, CREAM OR POWDERED CREAMER of any kind), and Gatorade    Take these medicines the morning of surgery with A SIP OF WATER acetaminophen (TYLENOL) 650 MG CR tablet cinacalcet (SENSIPAR) 30 MG tablet omeprazole (PRILOSEC) 20 MG capsule    As of today, STOP taking any Mobic, Aspirin (unless otherwise instructed by your surgeon) Aleve, Naproxen, Ibuprofen, Motrin, Advil, Goody's, BC's, all herbal medications, fish oil, and all vitamins.  After your COVID test   You are not required to quarantine however you are required to wear a well-fitting mask when you are out and around people not in your household.  If your mask becomes wet or soiled, replace with a new one.  Wash your hands often with soap and water for 20 seconds or clean your hands with an alcohol-based hand sanitizer that contains at least 60% alcohol.  Do not share personal items.  Notify your provider: if you are in close contact with someone who has COVID  or if you develop a fever of 100.4 or greater, sneezing, cough, sore throat, shortness of breath or body aches.             Do not wear jewelry  Do not wear lotions, powders, cologne, or deodorant. Do not shave 48 hours prior to surgery.  Men may shave face and neck. Do not bring valuables to the hospital. DO Not wear nail polish, gel polish, artificial  nails, or any other type of covering on natural nails including finger and toenails. If patients have artificial nails, gel coating, etc. that need to be removed by a nail salon, please have this removed prior to surgery or surgery may need to be canceled/delayed if the surgeon/ anesthesia feels like the patient is unable to be adequately monitored.             [facility] is not responsible for any belongings or valuables.  Do NOT Smoke (Tobacco/Vaping)  24 hours prior to your procedure  If you use a CPAP at night, you may bring your mask for your overnight stay.   Contacts, glasses, hearing aids, dentures or partials may not be worn into surgery, please bring cases for these belongings   For patients admitted to the hospital, discharge time will be determined by your treatment team.   Patients discharged the day of surgery will not be allowed to drive home, and someone needs to stay with them for 24 hours.  NO VISITORS WILL BE ALLOWED IN PRE-OP WHERE PATIENTS ARE PREPPED FOR SURGERY.  ONLY 1 SUPPORT PERSON MAY BE PRESENT IN THE WAITING ROOM WHILE YOU ARE IN SURGERY.  IF YOU ARE TO BE ADMITTED, ONCE YOU ARE IN YOUR ROOM YOU WILL BE ALLOWED TWO (2) VISITORS. 1 (ONE) VISITOR MAY STAY OVERNIGHT BUT MUST ARRIVE TO THE ROOM BY 8pm.  Minor children may have two parents present. Special  consideration for safety and communication needs will be reviewed on a case by case basis.  Special instructions:    Oral Hygiene is also important to reduce your risk of infection.  Remember - BRUSH YOUR TEETH THE MORNING OF SURGERY WITH YOUR REGULAR TOOTHPASTE   [facility]- Preparing For Surgery  Before surgery, you can play an important role. Because skin is not sterile, your skin needs to be as free of germs as possible. You can reduce the number of germs on your skin by washing with CHG (chlorahexidine gluconate) Soap before surgery.  CHG is an antiseptic cleaner which kills germs and bonds with the skin to  continue killing germs even after washing.     Please do not use if you have an allergy to CHG or antibacterial soaps. If your skin becomes reddened/irritated stop using the CHG.  Do not shave (including legs and underarms) for at least 48 hours prior to first CHG shower. It is OK to shave your face.  Please follow these instructions carefully.     Shower the NIGHT BEFORE SURGERY and the MORNING OF SURGERY with CHG Soap.   If you chose to wash your hair, wash your hair first as usual with your normal shampoo. After you shampoo, rinse your hair and body thoroughly to remove the shampoo.  Then [employer] and genitals (private parts) with your normal soap and rinse thoroughly to remove soap.  After that Use CHG Soap as you would any other liquid soap. You can apply CHG directly to the skin and wash gently with a scrungie or a clean washcloth.   Apply the CHG Soap to your body ONLY FROM THE NECK DOWN.  Do not use on open wounds or open sores. Avoid contact with your eyes, ears, mouth and genitals (private parts). Wash Face and genitals (private parts)  with your normal soap.   Wash thoroughly, paying special attention to the area where your surgery will be performed.  Thoroughly rinse your body with warm water from the neck down.  DO NOT shower/wash with your normal soap after using and rinsing off the CHG Soap.  Pat yourself dry with a CLEAN TOWEL.  Wear CLEAN PAJAMAS to bed the night before surgery  Place CLEAN SHEETS on your bed the night before your surgery  DO NOT SLEEP WITH PETS.   Day of Surgery:  Take a shower with CHG soap. Wear Clean/Comfortable clothing the morning of surgery Do not apply any deodorants/lotions.   Remember to brush your teeth WITH YOUR REGULAR TOOTHPASTE.   Please read over the following fact sheets that you were given.

## 2020-12-06 ENCOUNTER — Other Ambulatory Visit: Payer: Self-pay | Admitting: Orthopedic Surgery

## 2020-12-12 ENCOUNTER — Encounter (HOSPITAL_COMMUNITY): Payer: Self-pay | Admitting: Orthopedic Surgery

## 2020-12-12 NOTE — Anesthesia Preprocedure Evaluation (Addendum)
Anesthesia Evaluation  Patient identified by MRN, date of birth, ID band Patient awake    Reviewed: Allergy & Precautions, NPO status , Patient's Chart, lab work & pertinent test results  Airway Mallampati: III  TM Distance: >3 FB Neck ROM: Full    Dental no notable dental hx. (+) Teeth Intact, Dental Advisory Given   Pulmonary sleep apnea , former smoker,    Pulmonary exam normal breath sounds clear to auscultation       Cardiovascular hypertension, Pt. on medications Normal cardiovascular exam Rhythm:Regular Rate:Normal  TTE 2021 1. Left ventricular ejection fraction, by estimation, is 55 to 60%. The  left ventricle has normal function. The left ventricle has no regional  wall motion abnormalities. There is mild left ventricular hypertrophy.  Left ventricular diastolic parameters  are consistent with Grade I diastolic dysfunction (impaired relaxation).  2. Right ventricular systolic function is normal. The right ventricular  size is normal. Tricuspid regurgitation signal is inadequate for assessing  PA pressure.  3. The mitral valve is normal in structure. No evidence of mitral valve  regurgitation. No evidence of mitral stenosis.  4. The aortic valve is tricuspid. Aortic valve regurgitation is not  visualized. Mild aortic valve sclerosis is present, with no evidence of  aortic valve stenosis.  5. Aortic dilatation noted. There is mild dilatation of the aortic root,  measuring 38 mm.  6. The inferior vena cava is normal in size with greater than 50%  respiratory variability, suggesting right atrial pressure of 3 mmHg.   Stress Test 2021 The left ventricular ejection fraction is low normal 52%. Nuclear stress EF: 52%. There was no ST segment deviation noted during stress. The study is normal. This is a low risk study.    Neuro/Psych negative neurological ROS  negative psych ROS   GI/Hepatic Neg liver ROS, GERD   Medicated,  Endo/Other  Morbid obesity (BMI 43)  Renal/GU ESRF and DialysisRenal disease (MWF, had dialysis yesterday)  negative genitourinary   Musculoskeletal  (+) Arthritis ,   Abdominal   Peds  Hematology negative hematology ROS (+)   Anesthesia Other Findings   Reproductive/Obstetrics                            Anesthesia Physical Anesthesia Plan  ASA: 3  Anesthesia Plan: MAC and Regional   Post-op Pain Management:  Regional for Post-op pain   Induction: Intravenous  PONV Risk Score and Plan: Propofol infusion, Treatment may vary due to age or medical condition, Midazolam, Ondansetron and Dexamethasone  Airway Management Planned: Natural Airway  Additional Equipment:   Intra-op Plan:   Post-operative Plan:   Informed Consent: I have reviewed the patients History and Physical, chart, labs and discussed the procedure including the risks, benefits and alternatives for the proposed anesthesia with the patient or authorized representative who has indicated his/her understanding and acceptance.     Dental advisory given  Plan Discussed with: CRNA  Anesthesia Plan Comments:         Anesthesia Quick Evaluation

## 2020-12-13 ENCOUNTER — Ambulatory Visit (HOSPITAL_COMMUNITY)
Admission: RE | Admit: 2020-12-13 | Discharge: 2020-12-13 | Disposition: A | Discharge status: Home / Self Care | Payer: Medicare Other | Source: Ambulatory Visit | Attending: Orthopedic Surgery | Admitting: Orthopedic Surgery

## 2020-12-13 ENCOUNTER — Other Ambulatory Visit: Payer: Self-pay

## 2020-12-13 ENCOUNTER — Ambulatory Visit (HOSPITAL_COMMUNITY): Payer: Medicare Other | Admitting: Certified Registered"

## 2020-12-13 ENCOUNTER — Encounter (HOSPITAL_COMMUNITY)
Admission: RE | Disposition: A | Discharge status: Home / Self Care | Payer: Self-pay | Source: Ambulatory Visit | Attending: Orthopedic Surgery

## 2020-12-13 ENCOUNTER — Ambulatory Visit (HOSPITAL_COMMUNITY): Payer: Medicare Other | Admitting: Physician Assistant

## 2020-12-13 ENCOUNTER — Encounter (HOSPITAL_COMMUNITY): Payer: Self-pay | Admitting: Orthopedic Surgery

## 2020-12-13 DIAGNOSIS — Z79899 Other long term (current) drug therapy: Secondary | ICD-10-CM | POA: Diagnosis not present

## 2020-12-13 DIAGNOSIS — G629 Polyneuropathy, unspecified: Secondary | ICD-10-CM | POA: Insufficient documentation

## 2020-12-13 DIAGNOSIS — Z882 Allergy status to sulfonamides status: Secondary | ICD-10-CM | POA: Diagnosis not present

## 2020-12-13 DIAGNOSIS — Z87891 Personal history of nicotine dependence: Secondary | ICD-10-CM | POA: Diagnosis not present

## 2020-12-13 DIAGNOSIS — Z888 Allergy status to other drugs, medicaments and biological substances status: Secondary | ICD-10-CM | POA: Insufficient documentation

## 2020-12-13 DIAGNOSIS — G5622 Lesion of ulnar nerve, left upper limb: Secondary | ICD-10-CM | POA: Diagnosis not present

## 2020-12-13 DIAGNOSIS — Z791 Long term (current) use of non-steroidal anti-inflammatories (NSAID): Secondary | ICD-10-CM | POA: Insufficient documentation

## 2020-12-13 DIAGNOSIS — Z8616 Personal history of COVID-19: Secondary | ICD-10-CM | POA: Insufficient documentation

## 2020-12-13 HISTORY — PX: ANTERIOR INTEROSSEOUS NERVE DECOMPRESSION: SHX5735

## 2020-12-13 SURGERY — ANTERIOR INTEROSSEOUS NERVE DECOMPRESSION
Anesthesia: Monitor Anesthesia Care | Site: Elbow | Laterality: Left

## 2020-12-13 MED ORDER — FENTANYL CITRATE (PF) 100 MCG/2ML IJ SOLN
INTRAMUSCULAR | Status: AC
  Filled 2020-12-13: qty 2

## 2020-12-13 MED ORDER — CEFAZOLIN IN SODIUM CHLORIDE 3-0.9 GM/100ML-% IV SOLN
3.0000 g | INTRAVENOUS | Status: AC
Start: 2020-12-13 — End: 2020-12-13
  Administered 2020-12-13: 3 g via INTRAVENOUS
  Filled 2020-12-13: qty 100

## 2020-12-13 MED ORDER — DEXMEDETOMIDINE (PRECEDEX) IN NS 20 MCG/5ML (4 MCG/ML) IV SYRINGE
PREFILLED_SYRINGE | INTRAVENOUS | Status: DC | PRN
  Administered 2020-12-13 (×2): 8 ug via INTRAVENOUS

## 2020-12-13 MED ORDER — ACETAMINOPHEN 500 MG PO TABS
1000.0000 mg | ORAL_TABLET | Freq: Once | ORAL | Status: DC
  Filled 2020-12-13: qty 2

## 2020-12-13 MED ORDER — MIDAZOLAM HCL 5 MG/5ML IJ SOLN
INTRAMUSCULAR | Status: DC | PRN
Start: 2020-12-13 — End: 2020-12-13
  Administered 2020-12-13 (×2): .5 mg via INTRAVENOUS

## 2020-12-13 MED ORDER — PROPOFOL 500 MG/50ML IV EMUL
INTRAVENOUS | Status: DC | PRN
  Administered 2020-12-13: 25 ug/kg/min via INTRAVENOUS

## 2020-12-13 MED ORDER — MIDAZOLAM HCL 2 MG/2ML IJ SOLN
INTRAMUSCULAR | Status: AC
  Filled 2020-12-13: qty 2

## 2020-12-13 MED ORDER — SODIUM CHLORIDE 0.9 % IV SOLN
INTRAVENOUS | Status: DC | PRN
Start: 2020-12-13 — End: 2020-12-13

## 2020-12-13 MED ORDER — ORAL CARE MOUTH RINSE: 15.0000 mL | Freq: Once | OROMUCOSAL | Status: AC

## 2020-12-13 MED ORDER — OXYCODONE HCL 5 MG PO TABS: 5.0000 mg | ORAL_TABLET | Freq: Four times a day (QID) | ORAL | 0 refills | Status: DC | PRN

## 2020-12-13 MED ORDER — FENTANYL CITRATE (PF) 250 MCG/5ML IJ SOLN
INTRAMUSCULAR | Status: DC | PRN
  Administered 2020-12-13: 25 ug via INTRAVENOUS
  Administered 2020-12-13: 50 ug via INTRAVENOUS

## 2020-12-13 MED ORDER — CHLORHEXIDINE GLUCONATE 0.12 % MT SOLN
15.0000 mL | Freq: Once | OROMUCOSAL | Status: AC
  Administered 2020-12-13: 15 mL via OROMUCOSAL
  Filled 2020-12-13: qty 15

## 2020-12-13 MED ORDER — LACTATED RINGERS IV SOLN: INTRAVENOUS | Status: DC

## 2020-12-13 MED ORDER — LIDOCAINE-EPINEPHRINE 1 %-1:100000 IJ SOLN
INTRAMUSCULAR | Status: AC
  Filled 2020-12-13: qty 1

## 2020-12-13 MED ORDER — FENTANYL CITRATE (PF) 100 MCG/2ML IJ SOLN: 25.0000 ug | INTRAMUSCULAR | Status: DC | PRN

## 2020-12-13 MED ORDER — PHENYLEPHRINE 40 MCG/ML (10ML) SYRINGE FOR IV PUSH (FOR BLOOD PRESSURE SUPPORT)
PREFILLED_SYRINGE | INTRAVENOUS | Status: DC | PRN
  Administered 2020-12-13: 80 ug via INTRAVENOUS
  Administered 2020-12-13: 120 ug via INTRAVENOUS
  Administered 2020-12-13 (×2): 80 ug via INTRAVENOUS

## 2020-12-13 MED ORDER — PROPOFOL 10 MG/ML IV BOLUS
INTRAVENOUS | Status: DC | PRN
  Administered 2020-12-13: 10 mg via INTRAVENOUS

## 2020-12-13 MED ORDER — MEPIVACAINE HCL (PF) 2 % IJ SOLN
INTRAMUSCULAR | Status: DC | PRN
  Administered 2020-12-13: 20 mL

## 2020-12-13 MED ORDER — SODIUM CHLORIDE 0.9 % IR SOLN
Status: DC | PRN
  Administered 2020-12-13: 1000 mL

## 2020-12-13 MED ORDER — LIDOCAINE-EPINEPHRINE 1 %-1:100000 IJ SOLN
INTRAMUSCULAR | Status: DC | PRN
  Administered 2020-12-13: 4 mL

## 2020-12-13 SURGICAL SUPPLY — 41 items
APL PRP STRL LF DISP 70% ISPRP (MISCELLANEOUS) ×1
BAG COUNTER SPONGE SURGICOUNT (BAG) ×2 IMPLANT
BAG SPNG CNTER NS LX DISP (BAG) ×1
BAND INSRT 18 STRL LF DISP RB (MISCELLANEOUS)
BAND RUBBER #18 3X1/16 STRL (MISCELLANEOUS) IMPLANT
BLADE SURG 15 STRL LF DISP TIS (BLADE) ×1 IMPLANT
BLADE SURG 15 STRL SS (BLADE) ×2
BNDG COHESIVE 2X5 TAN STRL LF (GAUZE/BANDAGES/DRESSINGS) IMPLANT
BNDG COHESIVE 4X5 TAN STRL (GAUZE/BANDAGES/DRESSINGS) ×2 IMPLANT
BNDG GAUZE ELAST 4 BULKY (GAUZE/BANDAGES/DRESSINGS) ×4 IMPLANT
CHLORAPREP W/TINT 26 (MISCELLANEOUS) ×2 IMPLANT
CORD BIPOLAR FORCEPS 12FT (ELECTRODE) ×2 IMPLANT
CUFF TOURN SGL QUICK 18X4 (TOURNIQUET CUFF) IMPLANT
DRAPE SURG 17X23 STRL (DRAPES) ×2 IMPLANT
DRSG EMULSION OIL 3X3 NADH (GAUZE/BANDAGES/DRESSINGS) ×2 IMPLANT
GAUZE SPONGE 4X4 12PLY STRL (GAUZE/BANDAGES/DRESSINGS) ×2 IMPLANT
GLOVE SRG 8 PF TXTR STRL LF DI (GLOVE) ×1 IMPLANT
GLOVE SURG ENC MOIS LTX SZ7.5 (GLOVE) ×2 IMPLANT
GLOVE SURG LTX SZ6.5 (GLOVE) ×2 IMPLANT
GLOVE SURG UNDER POLY LF SZ7 (GLOVE) ×2 IMPLANT
GLOVE SURG UNDER POLY LF SZ8 (GLOVE) ×2
GOWN STRL REUS W/ TWL LRG LVL3 (GOWN DISPOSABLE) ×2 IMPLANT
GOWN STRL REUS W/ TWL XL LVL3 (GOWN DISPOSABLE) ×1 IMPLANT
GOWN STRL REUS W/TWL LRG LVL3 (GOWN DISPOSABLE) ×4
GOWN STRL REUS W/TWL XL LVL3 (GOWN DISPOSABLE) ×2
KIT BASIN OR (CUSTOM PROCEDURE TRAY) ×2 IMPLANT
NDL HYPO 25X1 1.5 SAFETY (NEEDLE) IMPLANT
NEEDLE HYPO 25X1 1.5 SAFETY (NEEDLE) IMPLANT
NS IRRIG 1000ML POUR BTL (IV SOLUTION) ×2 IMPLANT
PACK ORTHO EXTREMITY (CUSTOM PROCEDURE TRAY) ×2 IMPLANT
PADDING CAST ABS 4INX4YD NS (CAST SUPPLIES)
PADDING CAST ABS COTTON 4X4 ST (CAST SUPPLIES) IMPLANT
SET IRRIG Y TYPE TUR BLADDER L (SET/KITS/TRAYS/PACK) IMPLANT
STAPLER VISISTAT 35W (STAPLE) ×1 IMPLANT
STOCKINETTE 6  STRL (DRAPES) ×2
STOCKINETTE 6 STRL (DRAPES) ×1 IMPLANT
SUT VIC AB 3-0 PS2 18 (SUTURE) ×2
SUT VIC AB 3-0 PS2 18XBRD (SUTURE) IMPLANT
SUT VICRYL RAPIDE 4/0 PS 2 (SUTURE) IMPLANT
SYR 10ML LL (SYRINGE) IMPLANT
UNDERPAD 30X36 HEAVY ABSORB (UNDERPADS AND DIAPERS) ×2 IMPLANT

## 2020-12-13 NOTE — Discharge Instructions (Signed)
Discharge Instructions   You have a light dressing on your hand.  You may begin gentle motion of your fingers and hand immediately, but you should not do any heavy lifting or gripping.  Elevate your hand to reduce pain & swelling of the digits.  Ice over the operative site may be helpful to reduce pain & swelling.  DO NOT USE HEAT. Pain medicine has been prescribed for you.  Take Tylenol 650 mg and return to Meloxicam 15 mg. Add Oxycodone 5 mg for severe post operative pain. Leave ace wraps and padding on until tomorrow. Keep the mepilex (band aid) in place until first post operative appointment.  After the bandage has been removed you may shower, regularly washing the incision and letting the water run over it, but not submerging it (no swimming, soaking it in dishwater, etc.) You may shower with the band aid. You may drive a car when you are off of prescription pain medications and can safely control your vehicle with both hands. We will address whether therapy will be required or not when you return to the office. You may have already made your follow-up appointment when we completed your preop visit.  If not, please call our office today or the next business day to make your return appointment for 10-15 days after surgery.   Please call [phone] during normal business hours or [phone] after hours for any problems. Including the following:  - excessive redness of the incisions - drainage for more than 4 days - fever of more than 101.5 F  *Please note that pain medications will not be refilled after hours or on weekends.

## 2020-12-13 NOTE — Anesthesia Postprocedure Evaluation (Signed)
Anesthesia Post Note  Patient: Richard Bush  Procedure(s) Performed: LEFT ULNAR NEUROPLASTY AT THE ELBOW (Left: Elbow)     Patient location during evaluation: PACU Anesthesia Type: Regional and MAC Level of consciousness: awake and alert Pain management: pain level controlled Vital Signs Assessment: post-procedure vital signs reviewed and stable Respiratory status: spontaneous breathing, nonlabored ventilation, respiratory function stable and patient connected to nasal cannula oxygen Cardiovascular status: stable and blood pressure returned to baseline Postop Assessment: no apparent nausea or vomiting Anesthetic complications: no   No notable events documented.  Last Vitals:  Vitals:   12/13/20 0857 12/13/20 0908  BP: (!) 112/59 (!) 122/59  Pulse: 80 78  Resp: 16 14  Temp:  36.4 C  SpO2: 93% 94%    Last Pain:  Vitals:   12/13/20 0908  TempSrc:   PainSc: 0-No pain                 Davy Westmoreland L Karagan Lehr

## 2020-12-13 NOTE — H&P (Signed)
Richard Bush is an 70 y.o. male.   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  Patient desires to proceed operatively with decompression.  Surgical approach complicated by working L UE fistula.  Past Medical History:  Diagnosis Date   Acute respiratory failure with hypoxia ([place]) 02/19/2019   Anemia    Bleeding pseudoaneurysm of left brachiocephalic AV fistula, initial encounter ([place]) 01/05/2017   Cataract    Chronic right hip pain    Difficult intravenous access 04/25/2019   ESRD (end stage renal disease) on dialysis [facility])    dialysis M/W/F- [facility] (01/06/2017)   GERD (gastroesophageal reflux disease)    Gout    High cholesterol    Hypertension    Neuropathy    Osteoarthritis    "all over" (01/06/2017)   Pleurisy    Pneumonia 1980s X 1   Pneumonia due to COVID-19 virus 02/20/2019   Sleep apnea     Past Surgical History:  Procedure Laterality Date   A/V FISTULAGRAM Left 12/24/2016   Procedure: A/V FISTULAGRAM;  Surgeon: Waynetta Sandy, MD;  Location: [facility];  Service: Cardiovascular;  Laterality: Left;   APPENDECTOMY  1963   AV FISTULA PLACEMENT Left 08/04/2016   Procedure: ARTERIOVENOUS (AV) FISTULA CREATION/LEFT ARM;  Surgeon: Serafina Mitchell, MD;  Location: [facility];  Service: Vascular;  Laterality: Left;   AV FISTULA REPAIR Left 01/06/2017   arm   BILATERAL CARPAL TUNNEL RELEASE Bilateral 2006-2007   right-left   CHOLECYSTECTOMY OPEN  1979   COLONOSCOPY     FINGER SURGERY Right 2006   "thumb; took out bone that conects to wrist and a piece of tendon; weaved tendon back in for mobility"   FISTULA SUPERFICIALIZATION Left 09/24/2016   Procedure: FISTULA SUPERFICIALIZATION- LEFT ARM;  Surgeon: Serafina Mitchell, MD;  Location: [facility];  Service: Vascular;  Laterality: Left;   HAMMER TOE SURGERY Left [place] Left 08/04/2016   Procedure: INSERTION OF DIALYSIS CATHETER;  Surgeon: Serafina Mitchell, MD;  Location: [facility];  Service: Vascular;  Laterality: Left;   JOINT REPLACEMENT     LIGATION OF ARTERIOVENOUS  FISTULA Left 01/21/2017   Procedure: REPAIR OF ARTERIOVENOUS  FISTULA;  Surgeon: Angelia Mould, MD;  Location: [facility];  Service: Vascular;  Laterality: Left;   LUMBAR LAMINECTOMY/DECOMPRESSION MICRODISCECTOMY N/A 08/18/2012   Procedure: LUMBAR LAMINECTOMY/DECOMPRESSION MICRODISCECTOMY 3 LEVELS;  Surgeon: Hosie Spangle, MD;  Location: [facility];  Service: Neurosurgery;  Laterality: N/A;  Lumbar Two through Five Laminectomy   REVISON OF ARTERIOVENOUS FISTULA Left 01/06/2017   Procedure: REVISON OF ARTERIOVENOUS FISTULA LEFT ARM;  Surgeon: Serafina Mitchell, MD;  Location: [facility];  Service: Vascular;  Laterality: Left;   SHOULDER OPEN ROTATOR CUFF REPAIR Right 2001   THROMBECTOMY AND REVISION OF ARTERIOVENTOUS (AV) GORETEX  GRAFT Left 01/17/2017   Procedure: REVISION OF ARTERIOVENTOUS (AV) FISTULA LEFT ARM;  Surgeon: Waynetta Sandy, MD;  Location: [facility];  Service: Vascular;  Laterality: Left;   TOTAL HIP ARTHROPLASTY Right 07/26/2013   Procedure: TOTAL HIP ARTHROPLASTY;  Surgeon: Kerin Salen, MD;  Location: [facility];  Service: Orthopedics;  Laterality: Right;   TOTAL KNEE ARTHROPLASTY Bilateral 2008-2010   right-left   WRIST TENDON TRANSFER Bilateral 1972-1973   left-right    Family History  Problem Relation Age of Onset   Heart disease Mother  Cancer Father    Colon polyps Father    Colon cancer Neg Hx    Esophageal cancer Neg Hx    Rectal cancer Neg Hx    Stomach cancer Neg Hx    Social History:  reports that he quit smoking about 38 years ago. His smoking use included cigarettes. He has a 10.00 pack-year smoking history. He has quit using smokeless tobacco.  His smokeless tobacco use included chew. He reports current alcohol use. He reports that he does  not use drugs.  Allergies:  Allergies  Allergen Reactions   Cozaar [Losartan Potassium] Other (See Comments)    Stopped due to kidney decline 4/18   Kayexalate [Polystyrene] Other (See Comments)    Abnormal ekg   Cholestyramine Other (See Comments)    Due to Kidney function   Ramipril Cough        Sodium Hyaluronate Other (See Comments)    Synvisc - swelling   Sulfa Antibiotics Rash    Medications Prior to Admission  Medication Sig Dispense Refill   acetaminophen (TYLENOL) 650 MG CR tablet Take 1,300 mg by mouth 2 (two) times daily.     cetirizine (ZYRTEC) 10 MG tablet Take 10 mg by mouth at bedtime.     cinacalcet (SENSIPAR) 30 MG tablet Take 60 mg by mouth every other day.     COLCRYS 0.6 MG tablet TAKE 1 TABLET BY MOUTH ONCE DAILY AS NEEDED FOR GOUT FLARE UPS (Patient taking differently: Take 0.6 mg by mouth daily as needed (gout flare ups).) 30 tablet 0   docusate sodium (COLACE) 100 MG capsule Take 500 mg by mouth daily with supper.     DULoxetine (CYMBALTA) 60 MG capsule Take 60 mg by mouth at bedtime.     febuxostat (ULORIC) 40 MG tablet Take 1/2 (one-half) tablet by mouth once daily (Patient taking differently: Take 20 mg by mouth daily with supper.) 15 tablet 0   folic acid-vitamin b complex-vitamin c-selenium-zinc (DIALYVITE) 3 MG TABS tablet Take 1 tablet by mouth every evening.      gabapentin (NEURONTIN) 300 MG capsule Take 300 mg by mouth at bedtime.     meloxicam (MOBIC) 15 MG tablet Take 15 mg by mouth daily with supper.     Menthol, Topical Analgesic, (BIOFREEZE) 10 % CREA Apply 1 application topically daily as needed (foot neuropathy).     omeprazole (PRILOSEC) 20 MG capsule Take 20 mg by mouth every morning.     sevelamer carbonate (RENVELA) 800 MG tablet Take 4,000 mg by mouth 3 (three) times daily with meals.     simvastatin (ZOCOR) 20 MG tablet Take 20 mg by mouth at bedtime.      No results found for this or any previous visit (from the past 48  hour(s)). No results found.  Review of Systems  All other systems reviewed and are negative.  Blood pressure (!) 112/55, pulse 92, temperature 98.1 F (36.7 C), temperature source Oral, resp. rate 19, height 5\' 10"  (1.778 m), weight 135.2 kg, SpO2 97 %. Physical Exam Vitals reviewed.  Constitutional:      Appearance: He is obese.  HENT:     Head: Normocephalic and atraumatic.  Eyes:     Extraocular Movements: Extraocular movements intact.  Cardiovascular:     Rate and Rhythm: Regular rhythm.  Pulmonary:     Effort: Pulmonary effort is normal.  Abdominal:     Palpations: Abdomen is soft.  Musculoskeletal:     Cervical back: Neck supple.  Comments: Decreased LT sens ulnar distribution left hand, L UE fistula with thrill  Skin:    General: Skin is warm and dry.     Capillary Refill: Capillary refill takes less than 2 seconds.  Neurological:     General: No focal deficit present.     Mental Status: He is alert.  Psychiatric:        Mood and Affect: Mood normal.        Behavior: Behavior normal.     Assessment/Plan L UE neuropathy at the elbow  Options d/w patient, confirmed desire to proceed with neuroplasty at elbow, making decision to transpose depending upon nerve stability once decompressed.  Will perform without tourniquet. G/R/O reviewed and consent obtained.  Jolyn Nap, MD 12/13/2020, 7:08 AM

## 2020-12-13 NOTE — Anesthesia Procedure Notes (Signed)
Anesthesia Regional Block: Supraclavicular block   Pre-Anesthetic Checklist: , timeout performed,  Correct Patient, Correct Site, Correct Laterality,  Correct Procedure, Correct Position, site marked,  Risks and benefits discussed,  Surgical consent,  Pre-op evaluation,  At surgeon's request and post-op pain management  Laterality: Left  Prep: Maximum Sterile Barrier Precautions used, chloraprep       Needles:  Injection technique: Single-shot  Needle Type: Echogenic Stimulator Needle     Needle Length: 9cm  Needle Gauge: 22     Additional Needles:   Procedures:,,,, ultrasound used (permanent image in chart),,    Narrative:  Start time: 12/13/2020 7:06 AM End time: 12/13/2020 7:16 AM Injection made incrementally with aspirations every 5 mL.  Performed by: Personally  Anesthesiologist: Freddrick March, MD  Additional Notes: Monitors applied. No increased pain on injection. No increased resistance to injection. Injection made in 5cc increments. Good needle visualization. Patient tolerated procedure well.

## 2020-12-13 NOTE — Interval H&P Note (Signed)
History and Physical Interval Note:  12/13/2020 7:13 AM  Richard Bush  has presented today for surgery, with the diagnosis of LEFT CUBITAL TUNNEL SYNDROME.  The various methods of treatment have been discussed with the patient and family. After consideration of risks, benefits and other options for treatment, the patient has consented to  Procedure(s) with comments: LEFT ULNAR NEUROPLASTY AT THE ELBOW (Left) - PRE-OP BLOCK as a surgical intervention.  The patient's history has been reviewed, patient examined, no change in status, stable for surgery.  I have reviewed the patient's chart and labs.  Questions were answered to the patient's satisfaction.     Jolyn Nap

## 2020-12-13 NOTE — Op Note (Signed)
12/13/2020  7:13 AM  PATIENT:  Richard Bush  70 y.o. male  PRE-OPERATIVE DIAGNOSIS:  left ulnar neuropathy at the elbow  POST-OPERATIVE DIAGNOSIS:  Same  PROCEDURE:  Left ulnar neuroplasty at the elbow (DIS)  SURGEON: Richard Char. Grandville Silos, MD  PHYSICIAN ASSISTANT: Richard Bush, OPA-C  ANESTHESIA:  regional and MAC  SPECIMENS:  None  DRAINS:   None  EBL:  less than 100 mL  PREOPERATIVE INDICATIONS:  Richard Bush is a  70 y.o. male with left ulnar neuropathy at the elbow  The risks benefits and alternatives were discussed with the patient preoperatively including but not limited to the risks of infection, bleeding, nerve injury, cardiopulmonary complications, the need for revision surgery, among others, and the patient verbalized understanding and consented to proceed.  OPERATIVE IMPLANTS: none  OPERATIVE PROCEDURE:  After receiving prophylactic antibiotics and a regional block, the patient was escorted to the operative theatre and placed in a supine position.  A surgical "time-out" was performed during which the planned procedure, proposed operative site, and the correct patient identity were compared to the operative consent and agreement confirmed by the circulating nurse according to current facility policy.  A tourniquet was applied but not inflated.  The exposed skin was prepped with Chloraprep and draped in the usual sterile fashion.    A curvilinear incision was marked over the course of the ulnar nerve at the elbow.  Initially, the degree of skin anesthesia was not yet developed such that the patient reported some discomfort at the upper end of the incision.  For this reason lidocaine with epinephrine was instilled just under the skin along the length of the incision and the operation proceeded.  This was done without the tourniquet inflated.  Subcutaneous tissues were dissected with spreading dissection, augmented with Bovie and bipolar as needed.  This was carried down to  the deep fascia looking for an attempting to preserve crossing cutaneous nerve structures.  The ulnar nerve was identified in the retromalleolar groove and exposed at that level.  It was dissected free for about 10 cm proximal to this, and then through the course of the proper portion of the cubital tunnel transecting Osborne's ligament and then dividing both the deep and superficial fascia of the FCU.  The nerve was visibly changed in its appearance directly under Osborne's ligament and it was in this region that it appeared the neuropathic changes had occurred.  The nerve was twitchy and irritable at that level as well.  Satisfied with the degree of release, the elbow was cycled through flexion and extension the nerve found remained stable within the groove.  The wound was copiously irrigated and skin reapproximated with 3-0 Vicryl deep dermal subcuticular sutures followed by staples at the skin and a Mepilex dressing was applied.  A loose but bulky dressing was applied over it, but this will be removed at the time of his first dialysis for access for his fistula.  At the conclusion of the case, the fistula maintained its warmth and thrill.  He was taken to recovery in stable condition.  DISPOSITION: He will be discharged home today with typical instructions, returning in 10 to 15 days for reevaluation.

## 2020-12-13 NOTE — Anesthesia Procedure Notes (Signed)
Procedure Name: MAC Date/Time: 12/13/2020 7:35 AM Performed by: Imagene Riches, CRNA Pre-anesthesia Checklist: Patient identified, Emergency Drugs available, Suction available, Patient being monitored and Timeout performed Patient Re-evaluated:Patient Re-evaluated prior to induction Oxygen Delivery Method: Simple face mask Preoxygenation: Pre-oxygenation with 100% oxygen

## 2020-12-13 NOTE — Transfer of Care (Signed)
Immediate Anesthesia Transfer of Care Note  Patient: Richard Bush  Procedure(s) Performed: LEFT ULNAR NEUROPLASTY AT THE ELBOW (Left: Elbow)  Patient Location: PACU  Anesthesia Type:MAC combined with regional for post-op pain  Level of Consciousness: drowsy  Airway & Oxygen Therapy: Patient Spontanous Breathing and Patient connected to face mask oxygen  Post-op Assessment: Report given to RN and Post -op Vital signs reviewed and stable  Post vital signs: Reviewed and stable  Last Vitals:  Vitals Value Taken Time  BP 99/43 12/13/20 0824  Temp    Pulse 78 12/13/20 0825  Resp 13 12/13/20 0825  SpO2 100 % 12/13/20 0825  Vitals shown include unvalidated device data.  Last Pain:  Vitals:   12/13/20 0556  TempSrc: Oral         Complications: No notable events documented.

## 2020-12-14 ENCOUNTER — Encounter (HOSPITAL_COMMUNITY): Payer: Self-pay | Admitting: Orthopedic Surgery

## 2020-12-17 LAB — POCT I-STAT, CHEM 8
BUN: 35 mg/dL — ABNORMAL HIGH (ref 8–23)
Calcium, Ion: 1.08 mmol/L — ABNORMAL LOW (ref 1.15–1.40)
Chloride: 99 mmol/L (ref 98–111)
Creatinine, Ser: 6.2 mg/dL — ABNORMAL HIGH (ref 0.61–1.24)
Glucose, Bld: 101 mg/dL — ABNORMAL HIGH (ref 70–99)
HCT: 41 % (ref 39.0–52.0)
Hemoglobin: 13.9 g/dL (ref 13.0–17.0)
Potassium: 4.5 mmol/L (ref 3.5–5.1)
Sodium: 138 mmol/L (ref 135–145)
TCO2: 30 mmol/L (ref 22–32)

## 2020-12-27 ENCOUNTER — Other Ambulatory Visit: Payer: Self-pay | Admitting: Physician Assistant

## 2020-12-30 ENCOUNTER — Other Ambulatory Visit: Payer: Self-pay | Admitting: *Deleted

## 2020-12-30 ENCOUNTER — Telehealth: Payer: Self-pay

## 2020-12-30 NOTE — Telephone Encounter (Signed)
Patient called requesting prescription refill of Uloric (90 day supply) to [facility] in [place], [place].  Patient only has 1 tablet remaining.

## 2020-12-30 NOTE — Telephone Encounter (Signed)
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  Creatinine 7.41 and GFR 7.  Uric acid is within the desirable range.   Okay to refill Uloric?

## 2021-01-13 NOTE — Progress Notes (Deleted)
Office Visit Note  Patient: Richard Bush             Date of Birth: 1950/04/17           MRN: [id number]             PCP: Quentin Cornwall, MD Referring: Quentin Cornwall, MD Visit Date: 01/21/2021 Occupation: @GUAROCC @  Subjective:  No chief complaint on file.   History of Present Illness: Richard Bush is a 70 y.o. male ***   Activities of Daily Living:  Patient reports morning stiffness for *** {minute/hour:19697}.   Patient {ACTIONS;DENIES/REPORTS:21021675::"Denies"} nocturnal pain.  Difficulty dressing/grooming: {ACTIONS;DENIES/REPORTS:21021675::"Denies"} Difficulty climbing stairs: {ACTIONS;DENIES/REPORTS:21021675::"Denies"} Difficulty getting out of chair: {ACTIONS;DENIES/REPORTS:21021675::"Denies"} Difficulty using hands for taps, buttons, cutlery, and/or writing: {ACTIONS;DENIES/REPORTS:21021675::"Denies"}  No Rheumatology ROS completed.   PMFS History:  Patient Active Problem List   Diagnosis Date Noted  . Difficult intravenous access 04/25/2019  . ESRD on hemodialysis ([place])   . ESRD (end stage renal disease) ([place]) 01/21/2017  . Orthostatic dizziness   . Pressure injury of skin 08/04/2016  . 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 Anemia secondary to renal failure 08/04/2016  . Pulmonary nodule 08/03/2016  . ESRD on dialysis ([place]) 08/03/2016  . Right bundle branch block 08/03/2016  . History of gastroesophageal reflux (GERD) 07/13/2016  . Idiopathic chronic gout without tophus 07/03/2016  . Primary osteoarthritis of left knee 07/03/2016  . Stage 4 chronic kidney disease ([place]) 07/03/2016  . Osteoarthritis of hand 07/03/2016  . Hypertension 07/03/2016  . Secondary hyperparathyroidism ([place]) 07/03/2016  . Primary osteoarthritis of both feet 07/03/2016  . History of total hip replacement, right 07/03/2016  . Hx of total knee replacement, bilateral 07/03/2016    Past Medical History:  Diagnosis Date  . Acute respiratory failure with hypoxia ([place])  02/19/2019  . Anemia   . Bleeding pseudoaneurysm of left brachiocephalic AV fistula, initial encounter ([place]) 01/05/2017  . Cataract   . Chronic right hip pain   . Difficult intravenous access 04/25/2019  . ESRD (end stage renal disease) on dialysis [facility])    dialysis M/W/F- [facility] (01/06/2017)  . GERD (gastroesophageal reflux disease)   . Gout   . High cholesterol   . Hypertension   . Neuropathy   . Osteoarthritis    "all over" (01/06/2017)  . Pleurisy   . Pneumonia 1980s X 1  . Pneumonia due to COVID-19 virus 02/20/2019  . Sleep apnea     Family History  Problem Relation Age of Onset  . Heart disease Mother   . Cancer Father   . Colon polyps Father   . Colon cancer Neg Hx   . Esophageal cancer Neg Hx   . Rectal cancer Neg Hx   . Stomach cancer Neg Hx    Past Surgical History:  Procedure Laterality Date  . A/V FISTULAGRAM Left 12/24/2016   Procedure: A/V FISTULAGRAM;  Surgeon: Waynetta Sandy, MD;  Location: [place] [facility];  Service: Cardiovascular;  Laterality: Left;  . ANTERIOR INTEROSSEOUS NERVE DECOMPRESSION Left 12/13/2020   Procedure: LEFT ULNAR NEUROPLASTY AT THE ELBOW;  Surgeon: Milly Jakob, MD;  Location: [place];  Service: Orthopedics;  Laterality: Left;  PRE-OP BLOCK  . APPENDECTOMY  1963  . AV FISTULA PLACEMENT Left 08/04/2016   Procedure: ARTERIOVENOUS (AV) FISTULA CREATION/LEFT ARM;  Surgeon: Serafina Mitchell, MD;  [facility];  Service: Vascular;  Laterality: Left;  . AV FISTULA REPAIR Left 01/06/2017   arm  . BILATERAL CARPAL TUNNEL  RELEASE Bilateral 2006-2007   right-left  . CHOLECYSTECTOMY OPEN  1979  . COLONOSCOPY    . FINGER SURGERY Right 2006   "thumb; took out bone that conects to wrist and a piece of tendon; weaved tendon back in for mobility"  . FISTULA SUPERFICIALIZATION Left 09/24/2016   Procedure: FISTULA SUPERFICIALIZATION- LEFT ARM;  Surgeon: Serafina Mitchell, MD;  Location: [place];  Service: Vascular;  Laterality:  Left;  . HAMMER TOE SURGERY Left 1981  . INSERTION OF DIALYSIS CATHETER Left 08/04/2016   Procedure: INSERTION OF DIALYSIS CATHETER;  Surgeon: Serafina Mitchell, MD;  Location: [place];  Service: Vascular;  Laterality: Left;  . JOINT REPLACEMENT    . LIGATION OF ARTERIOVENOUS  FISTULA Left 01/21/2017   Procedure: REPAIR OF ARTERIOVENOUS  FISTULA;  Surgeon: Angelia Mould, MD;  Location: [place];  Service: Vascular;  Laterality: Left;  . LUMBAR LAMINECTOMY/DECOMPRESSION MICRODISCECTOMY N/A 08/18/2012   Procedure: LUMBAR LAMINECTOMY/DECOMPRESSION MICRODISCECTOMY 3 LEVELS;  Surgeon: Hosie Spangle, MD;  Location: [place] [facility];  Service: Neurosurgery;  Laterality: N/A;  Lumbar Two through Five Laminectomy  . REVISON OF ARTERIOVENOUS FISTULA Left 01/06/2017   Procedure: REVISON OF ARTERIOVENOUS FISTULA LEFT ARM;  Surgeon: Serafina Mitchell, MD;  Location: [place];  Service: Vascular;  Laterality: Left;  . SHOULDER OPEN ROTATOR CUFF REPAIR Right 2001  . THROMBECTOMY AND REVISION OF ARTERIOVENTOUS (AV) GORETEX  GRAFT Left 01/17/2017   Procedure: REVISION OF ARTERIOVENTOUS (AV) FISTULA LEFT ARM;  Surgeon: Waynetta Sandy, MD;  Location: [facility];  Service: Vascular;  Laterality: Left;  . TOTAL HIP ARTHROPLASTY Right 07/26/2013   Procedure: TOTAL HIP ARTHROPLASTY;  Surgeon: Kerin Salen, MD;  Location: [place];  Service: Orthopedics;  Laterality: Right;  . TOTAL KNEE ARTHROPLASTY Bilateral 2008-2010   right-left  . WRIST TENDON TRANSFER Bilateral 1972-1973   left-right   Social History   Social History Narrative  . Not on file   Immunization History  Administered Date(s) Administered  . Moderna Sars-Covid-2 Vaccination 03/21/2019, 05/03/2019, 12/18/2019  . Pneumococcal Polysaccharide-23 08/06/2016     Objective: Vital Signs: There were no vitals taken for this visit.   Physical Exam   Musculoskeletal Exam: ***  CDAI Exam: CDAI Score: -- Patient Global: --; Provider Global:  -- Swollen: --; Tender: -- Joint Exam 01/21/2021   No joint exam has been documented for this visit   There is currently no information documented on the homunculus. Go to the Rheumatology activity and complete the homunculus joint exam.  Investigation: No additional findings.  Imaging: No results found.  Recent Labs: Lab Results  Component Value Date   WBC 7.7 11/28/2020   HGB 13.9 12/13/2020   PLT 243 11/28/2020   NA 138 12/13/2020   K 4.5 12/13/2020   CL 99 12/13/2020   CO2 23 11/28/2020   GLUCOSE 101 (H) 12/13/2020   BUN 35 (H) 12/13/2020   CREATININE 6.20 (H) 12/13/2020   BILITOT 0.3 11/28/2020   ALKPHOS 93 11/28/2020   AST 21 11/28/2020   ALT 22 11/28/2020   PROT 7.5 11/28/2020   ALBUMIN 4.6 11/28/2020   CALCIUM 9.7 11/28/2020   GFRAA 9 (L) 01/08/2020    Speciality Comments: No specialty comments available.  Procedures:  No procedures performed Allergies: Cozaar [losartan potassium], Kayexalate [polystyrene], Cholestyramine, Ramipril, Sodium hyaluronate, and Sulfa antibiotics   Assessment / Plan:     Visit Diagnoses: No diagnosis found.  Orders: No orders of the defined types were placed in this encounter.  No orders of the defined types were placed in this encounter.   Face-to-face time spent with patient was *** minutes. Greater than 50% of time was spent in counseling and coordination of care.  Follow-Up Instructions: No follow-ups on file.   Earnestine Mealing, CMA  Note - This record has been created using Editor, [employer].  Chart creation errors have been sought, but may not always  have been located. Such creation errors do not reflect on  the standard of medical care.

## 2021-01-16 ENCOUNTER — Other Ambulatory Visit (HOSPITAL_COMMUNITY): Payer: Self-pay | Admitting: Nephrology

## 2021-01-16 DIAGNOSIS — Z7682 Awaiting organ transplant status: Secondary | ICD-10-CM

## 2021-01-17 ENCOUNTER — Ambulatory Visit: Payer: Medicare Other | Admitting: Rheumatology

## 2021-01-21 ENCOUNTER — Ambulatory Visit: Payer: Medicare Other | Admitting: Rheumatology

## 2021-02-27 ENCOUNTER — Telehealth: Payer: Self-pay

## 2021-02-27 NOTE — Telephone Encounter (Signed)
Spoke with Richard Bush and advised [name] is not due for labs at this time. Advised his most recent CBC/CMP were on 02/24/2021. Advised we can draw uric acid at his appointment on 05/02/2021.

## 2021-02-27 NOTE — Telephone Encounter (Signed)
Patient's wife called stating Richard Bush had a lot of labwork when he has his kidney transplant in November.  She states the only labwork they didn't perform was his Uric Acid.  He was told he should not get any labwork or have a doctor's appointment until after 04/16/21.  Patient has appointment scheduled for 05/02/21 with [name].

## 2021-04-07 ENCOUNTER — Other Ambulatory Visit: Payer: Self-pay | Admitting: Physician Assistant

## 2021-04-07 NOTE — Telephone Encounter (Signed)
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 1.56, GFR 47, RBC 3.50, Hgb 10.6, Hct 32.9, MCHC 32.2   Okay to refill Uloric?

## 2021-04-18 NOTE — Progress Notes (Deleted)
? ?Office Visit Note ? ?Patient: [name]             ?Date of Birth: 1950/12/19           ?MRN: [id number]             ?PCP: Quentin Cornwall, MD ?Referring: Quentin Cornwall, MD ?Visit Date: 05/02/2021 ?Occupation: @GUAROCC @ ? ?Subjective:  ?No chief complaint on file. ? ? ?History of Present Illness: Richard Bush is a 71 y.o. male ***  ? ?Activities of Daily Living:  ?Patient reports morning stiffness for *** {minute/hour:19697}.   ?Patient {ACTIONS;DENIES/REPORTS:21021675::"Denies"} nocturnal pain.  ?Difficulty dressing/grooming: {ACTIONS;DENIES/REPORTS:21021675::"Denies"} ?Difficulty climbing stairs: {ACTIONS;DENIES/REPORTS:21021675::"Denies"} ?Difficulty getting out of chair: {ACTIONS;DENIES/REPORTS:21021675::"Denies"} ?Difficulty using hands for taps, buttons, cutlery, and/or writing: {ACTIONS;DENIES/REPORTS:21021675::"Denies"} ? ?No Rheumatology ROS completed.  ? ?PMFS History:  ?Patient Active Problem List  ? Diagnosis Date Noted  ? Difficult intravenous access 04/25/2019  ? ESRD on hemodialysis ([place])   ? ESRD (end stage renal disease) ([place]) 01/21/2017  ? Orthostatic dizziness   ? Pressure injury of skin 08/04/2016  ? 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 Anemia secondary to renal failure 08/04/2016  ? Pulmonary nodule 08/03/2016  ? ESRD on dialysis ([place]) 08/03/2016  ? Right bundle branch block 08/03/2016  ? History of gastroesophageal reflux (GERD) 07/13/2016  ? Idiopathic chronic gout without tophus 07/03/2016  ? Primary osteoarthritis of left knee 07/03/2016  ? Stage 4 chronic kidney disease ([place]) 07/03/2016  ? Osteoarthritis of hand 07/03/2016  ? Hypertension 07/03/2016  ? Secondary hyperparathyroidism ([place]) 07/03/2016  ? Primary osteoarthritis of both feet 07/03/2016  ? History of total hip replacement, right 07/03/2016  ? Hx of total knee replacement, bilateral 07/03/2016  ?  ?Past Medical History:  ?Diagnosis Date  ? Acute respiratory failure with hypoxia ([place]) 02/19/2019  ? Anemia    ? Bleeding pseudoaneurysm of left brachiocephalic AV fistula, initial encounter ([place]) 01/05/2017  ? Cataract   ? Chronic right hip pain   ? Difficult intravenous access 04/25/2019  ? ESRD (end stage renal disease) on dialysis [facility])   ? dialysis M/W/F- [facility] (01/06/2017)  ? GERD (gastroesophageal reflux disease)   ? Gout   ? High cholesterol   ? Hypertension   ? Neuropathy   ? Osteoarthritis   ? "all over" (01/06/2017)  ? Pleurisy   ? Pneumonia 1980s X 1  ? Pneumonia due to COVID-19 virus 02/20/2019  ? Sleep apnea   ?  ?Family History  ?Problem Relation Age of Onset  ? Heart disease Mother   ? Cancer Father   ? Colon polyps Father   ? Colon cancer Neg Hx   ? Esophageal cancer Neg Hx   ? Rectal cancer Neg Hx   ? Stomach cancer Neg Hx   ? ?Past Surgical History:  ?Procedure Laterality Date  ? A/V FISTULAGRAM Left 12/24/2016  ? Procedure: A/V FISTULAGRAM;  Surgeon: Waynetta Sandy, MD;  Location: [place] [facility];  Service: Cardiovascular;  Laterality: Left;  ? ANTERIOR INTEROSSEOUS NERVE DECOMPRESSION Left 12/13/2020  ? Procedure: LEFT ULNAR NEUROPLASTY AT THE ELBOW;  Surgeon: Milly Jakob, MD;  Location: [place];  Service: Orthopedics;  Laterality: Left;  PRE-OP BLOCK  ? APPENDECTOMY  1963  ? AV FISTULA PLACEMENT Left 08/04/2016  ? Procedure: ARTERIOVENOUS (AV) FISTULA CREATION/LEFT ARM;  Surgeon: Serafina Mitchell, MD;  [facility];  Service: Vascular;  Laterality: Left;  ? AV FISTULA REPAIR Left 01/06/2017  ? arm  ? BILATERAL CARPAL TUNNEL  RELEASE Bilateral 2006-2007  ? right-left  ? CHOLECYSTECTOMY OPEN  1979  ? COLONOSCOPY    ? FINGER SURGERY Right 2006  ? "thumb; took out bone that conects to wrist and a piece of tendon; weaved tendon back in for mobility"  ? FISTULA SUPERFICIALIZATION Left 09/24/2016  ? Procedure: FISTULA SUPERFICIALIZATION- LEFT ARM;  Surgeon: Serafina Mitchell, MD;  Location: [place];  Service: Vascular;  Laterality: Left;  ? HAMMER TOE SURGERY Left 1981  ? INSERTION OF  DIALYSIS CATHETER Left 08/04/2016  ? Procedure: INSERTION OF DIALYSIS CATHETER;  Surgeon: Serafina Mitchell, MD;  Location: [facility];  Service: Vascular;  Laterality: Left;  ? JOINT REPLACEMENT    ? LIGATION OF ARTERIOVENOUS  FISTULA Left 01/21/2017  ? Procedure: REPAIR OF ARTERIOVENOUS  FISTULA;  Surgeon: Angelia Mould, MD;  Location: [facility];  Service: Vascular;  Laterality: Left;  ? LUMBAR LAMINECTOMY/DECOMPRESSION MICRODISCECTOMY N/A 08/18/2012  ? Procedure: LUMBAR LAMINECTOMY/DECOMPRESSION MICRODISCECTOMY 3 LEVELS;  Surgeon: Hosie Spangle, MD;  Location: [place] [facility];  Service: Neurosurgery;  Laterality: N/A;  Lumbar Two through Five Laminectomy  ? REVISON OF ARTERIOVENOUS FISTULA Left 01/06/2017  ? Procedure: REVISON OF ARTERIOVENOUS FISTULA LEFT ARM;  Surgeon: Serafina Mitchell, MD;  [facility];  Service: Vascular;  Laterality: Left;  ? SHOULDER OPEN ROTATOR CUFF REPAIR Right 2001  ? THROMBECTOMY AND REVISION OF ARTERIOVENTOUS (AV) GORETEX  GRAFT Left 01/17/2017  ? Procedure: REVISION OF ARTERIOVENTOUS (AV) FISTULA LEFT ARM;  Surgeon: Waynetta Sandy, MD;  Location: [place];  Service: Vascular;  Laterality: Left;  ? TOTAL HIP ARTHROPLASTY Right 07/26/2013  ? Procedure: TOTAL HIP ARTHROPLASTY;  Surgeon: Kerin Salen, MD;  Location: [place];  Service: Orthopedics;  Laterality: Right;  ? TOTAL KNEE ARTHROPLASTY Bilateral 2008-2010  ? right-left  ? WRIST TENDON TRANSFER Bilateral 1972-1973  ? left-right  ? ?Social History  ? ?Social History Narrative  ? Not on file  ? ?Immunization History  ?Administered Date(s) Administered  ? Moderna Sars-Covid-2 Vaccination 03/21/2019, 05/03/2019, 12/18/2019  ? Pneumococcal Polysaccharide-23 08/06/2016  ?  ? ?Objective: ?Vital Signs: There were no vitals taken for this visit.  ? ?Physical Exam  ? ?Musculoskeletal Exam: *** ? ?CDAI Exam: ?CDAI Score: -- ?Patient Global: --; Provider Global: -- ?Swollen: --; Tender: -- ?Joint Exam 05/02/2021  ? ?No joint exam has  been documented for this visit  ? ?There is currently no information documented on the homunculus. Go to the Rheumatology activity and complete the homunculus joint exam. ? ?Investigation: ?No additional findings. ? ?Imaging: ?No results found. ? ?Recent Labs: ?Lab Results  ?Component Value Date  ? WBC 7.7 11/28/2020  ? HGB 13.9 12/13/2020  ? PLT 243 11/28/2020  ? NA 138 12/13/2020  ? K 4.5 12/13/2020  ? CL 99 12/13/2020  ? CO2 23 11/28/2020  ? GLUCOSE 101 (H) 12/13/2020  ? BUN 35 (H) 12/13/2020  ? CREATININE 6.20 (H) 12/13/2020  ? BILITOT 0.3 11/28/2020  ? ALKPHOS 93 11/28/2020  ? AST 21 11/28/2020  ? ALT 22 11/28/2020  ? PROT 7.5 11/28/2020  ? ALBUMIN 4.6 11/28/2020  ? CALCIUM 9.7 11/28/2020  ? GFRAA 9 (L) 01/08/2020  ? ? ?Speciality Comments: No specialty comments available. ? ?Procedures:  ?No procedures performed ?Allergies: Cozaar [losartan potassium], Kayexalate [polystyrene], Cholestyramine, Ramipril, Sodium hyaluronate, and Sulfa antibiotics  ? ?Assessment / Plan:     ?Visit Diagnoses: No diagnosis found. ? ?Orders: ?No orders of the defined types were placed in this encounter. ? ?  No orders of the defined types were placed in this encounter. ? ? ?Face-to-face time spent with patient was *** minutes. Greater than 50% of time was spent in counseling and coordination of care. ? ?Follow-Up Instructions: No follow-ups on file. ? ? ?Earnestine Mealing, CMA ? ?Note - This record has been created using [employer].  ?Chart creation errors have been sought, but may not always  ?have been located. Such creation errors do not reflect on  ?the standard of medical care.  ?

## 2021-05-02 ENCOUNTER — Ambulatory Visit: Payer: Medicare Other | Admitting: Rheumatology

## 2021-05-02 DIAGNOSIS — Z5181 Encounter for therapeutic drug level monitoring: Secondary | ICD-10-CM

## 2021-05-02 DIAGNOSIS — Z96653 Presence of artificial knee joint, bilateral: Secondary | ICD-10-CM

## 2021-05-02 DIAGNOSIS — M19071 Primary osteoarthritis, right ankle and foot: Secondary | ICD-10-CM

## 2021-05-02 DIAGNOSIS — Z87448 Personal history of other diseases of urinary system: Secondary | ICD-10-CM

## 2021-05-02 DIAGNOSIS — Z8719 Personal history of other diseases of the digestive system: Secondary | ICD-10-CM

## 2021-05-02 DIAGNOSIS — M19041 Primary osteoarthritis, right hand: Secondary | ICD-10-CM

## 2021-05-02 DIAGNOSIS — M65341 Trigger finger, right ring finger: Secondary | ICD-10-CM

## 2021-05-02 DIAGNOSIS — G8929 Other chronic pain: Secondary | ICD-10-CM

## 2021-05-02 DIAGNOSIS — Z96641 Presence of right artificial hip joint: Secondary | ICD-10-CM

## 2021-05-02 DIAGNOSIS — N2581 Secondary hyperparathyroidism of renal origin: Secondary | ICD-10-CM

## 2021-05-02 DIAGNOSIS — M1A39X0 Chronic gout due to renal impairment, multiple sites, without tophus (tophi): Secondary | ICD-10-CM

## 2021-08-18 ENCOUNTER — Other Ambulatory Visit: Payer: Self-pay | Admitting: Orthopedic Surgery

## 2021-08-18 DIAGNOSIS — M545 Low back pain, unspecified: Secondary | ICD-10-CM

## 2021-09-02 ENCOUNTER — Other Ambulatory Visit: Payer: Self-pay | Admitting: Orthopedic Surgery

## 2021-09-02 ENCOUNTER — Ambulatory Visit
Admission: RE | Admit: 2021-09-02 | Discharge: 2021-09-02 | Disposition: A | Discharge status: Home / Self Care | Payer: Medicare Other | Source: Ambulatory Visit | Attending: Orthopedic Surgery | Admitting: Orthopedic Surgery

## 2021-09-02 DIAGNOSIS — M545 Low back pain, unspecified: Secondary | ICD-10-CM

## 2021-09-05 ENCOUNTER — Ambulatory Visit
Admission: RE | Admit: 2021-09-05 | Discharge: 2021-09-05 | Disposition: A | Discharge status: Home / Self Care | Payer: Medicare Other | Source: Ambulatory Visit | Attending: Orthopedic Surgery | Admitting: Orthopedic Surgery

## 2021-09-05 DIAGNOSIS — M545 Low back pain, unspecified: Secondary | ICD-10-CM

## 2022-06-02 IMAGING — MR MR CERVICAL SPINE W/O CM
4 series · 24 of 48 positions shown · non-contrast
Comparison: 04/16/2020

CLINICAL DATA: Follow-up bone lesion

EXAM:
MRI CERVICAL SPINE WITHOUT CONTRAST
TECHNIQUE: Multiplanar, multisequence MR imaging of the cervical spine was
performed. No intravenous contrast was administered.

[Series 3: T2 · sagittal · 3.0mm · 0.66mm/px · 9 of 13 slices shown]
[im 1/13]
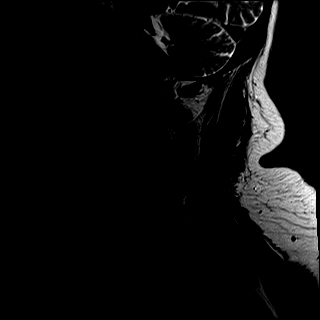
[im 2/13]
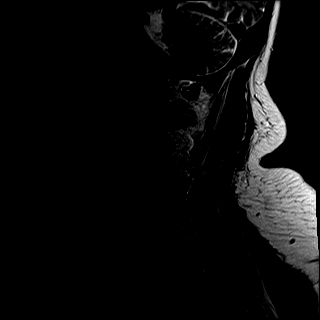
[im 4/13]
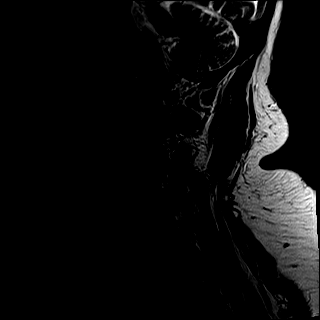
[im 5/13]
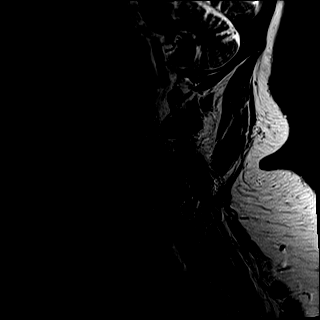
[im 7/13]
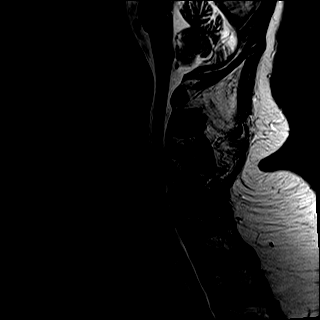
[im 8/13]
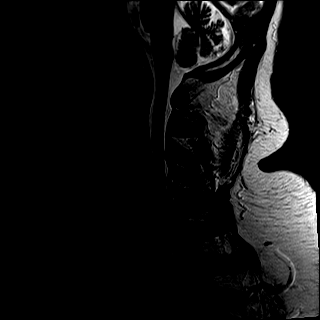
[im 10/13]
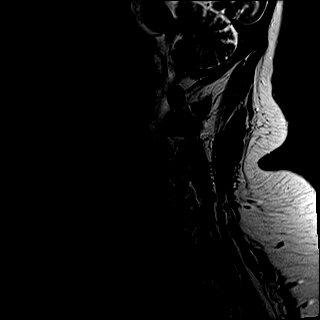
[im 11/13]
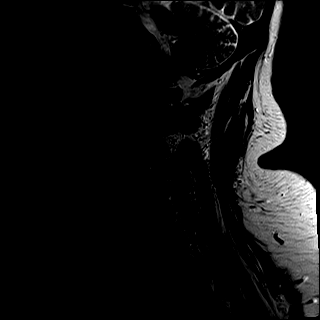
[im 13/13]
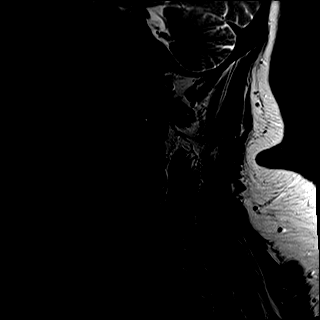

[Series 5: T1 · sagittal · 3.0mm · 0.41mm/px · 9 of 13 slices shown]
[im 1/13]
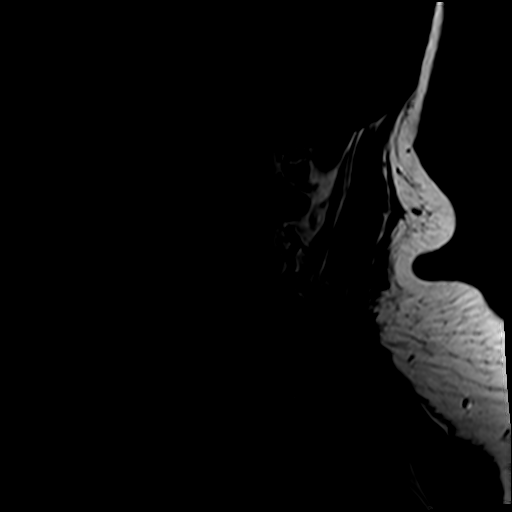
[im 2/13]
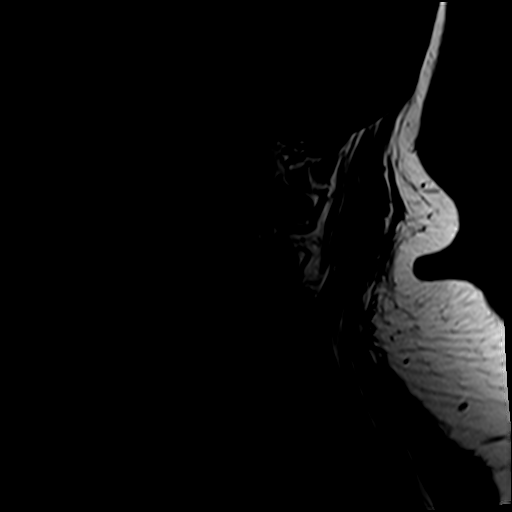
[im 4/13]
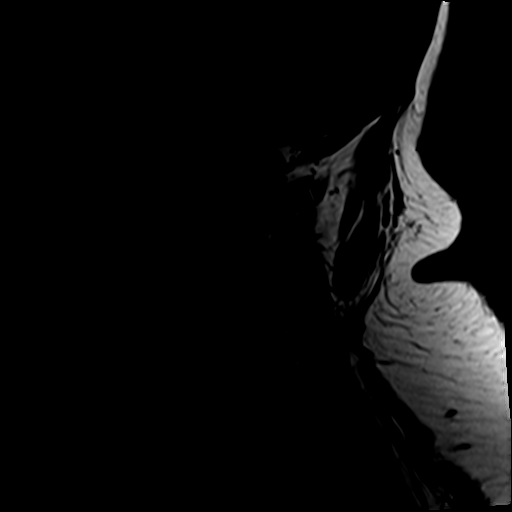
[im 5/13]
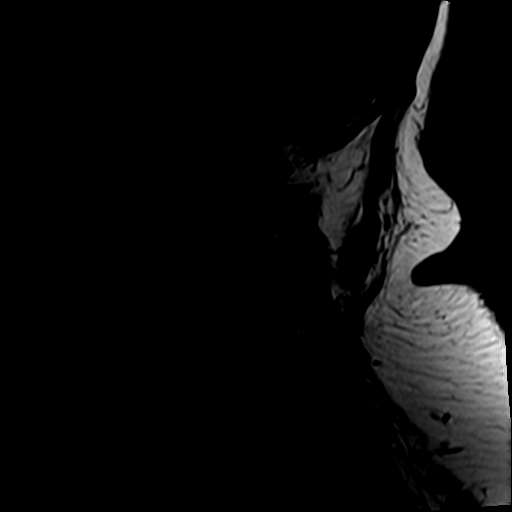
[im 7/13]
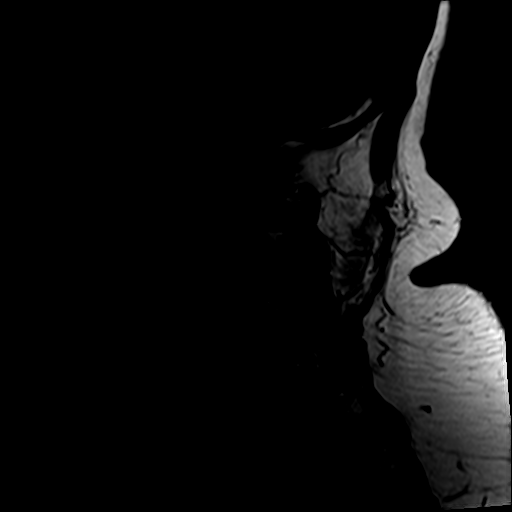
[im 8/13]
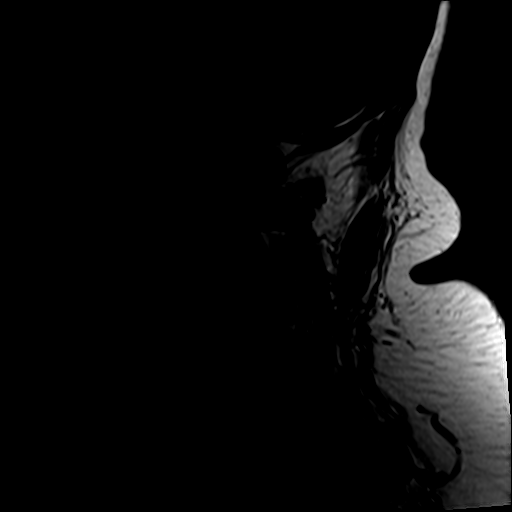
[im 10/13]
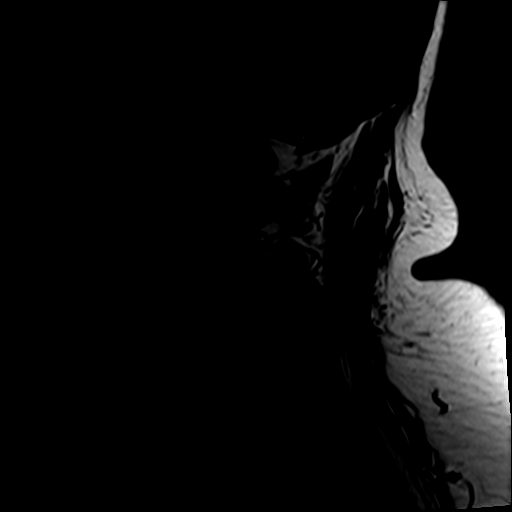
[im 11/13]
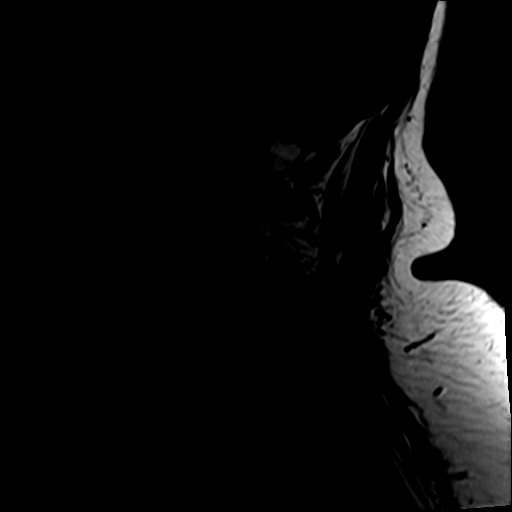
[im 13/13]
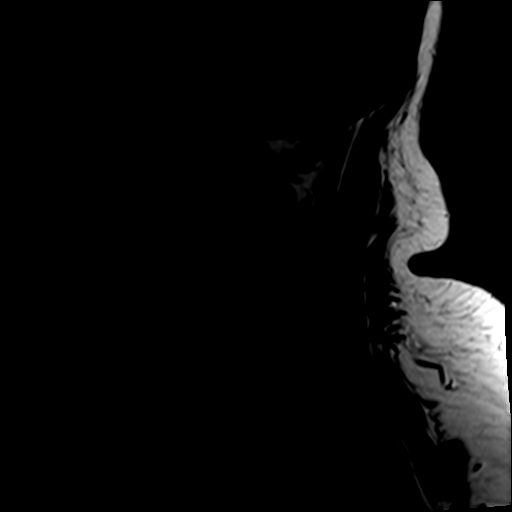

[Series 6: tir sag · sagittal · 3.0mm · 0.41mm/px · 3 of 13 slices shown]
[im 2/13]
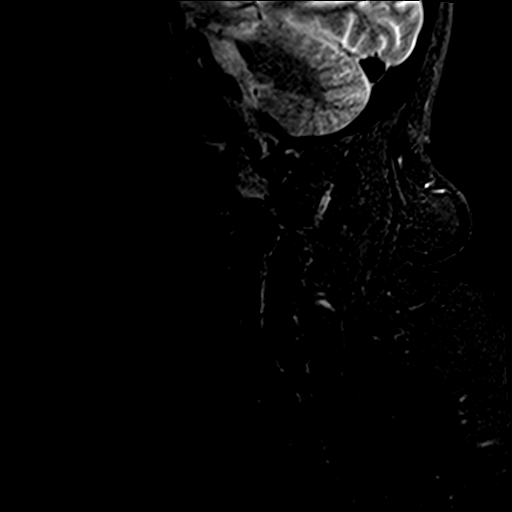
[im 7/13]
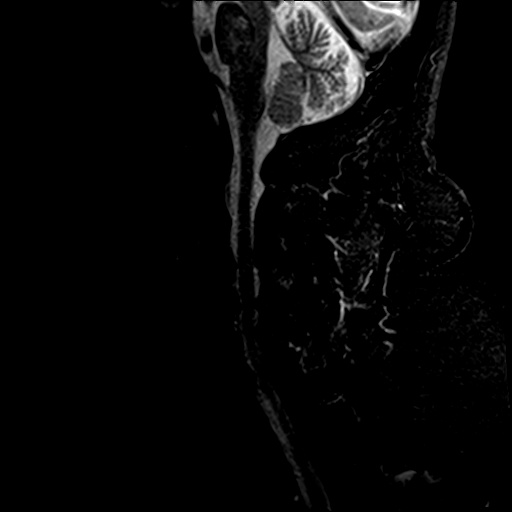
[im 11/13]
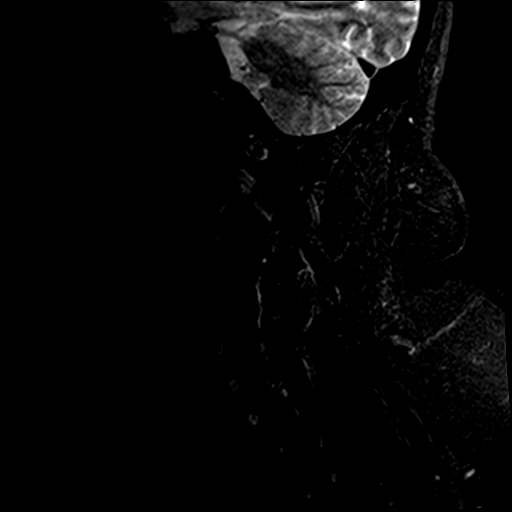

[Series 7: GRE · axial · 3.0mm · 0.35mm/px · z∈[-82,-9]mm · 3 of 30 slices shown]
[im 5/30]
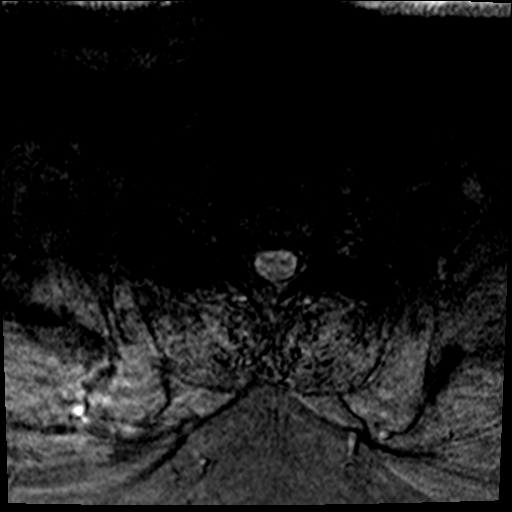
[im 15/30]
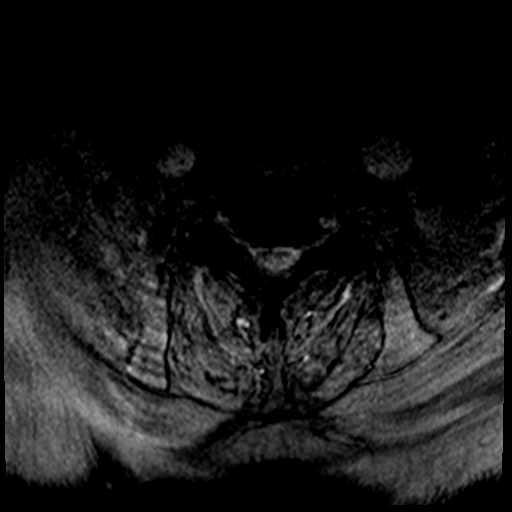
[im 25/30]
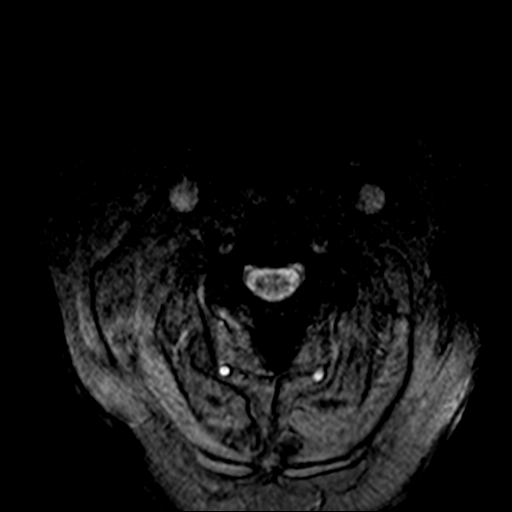

[24 of 48 positions shown; findings below may reference images not displayed]

FINDINGS: Alignment: Stable

Vertebrae: Unchanged T2 hyperintense and T1 hypointense bone lesion
at the level of C3 measuring up to 15 mm in maximal span. No
fracture deformity, new lesion, or evidence of soft tissue mass. The
stability is reassuring.

Cord: Normal signal and morphology as permitted by motion artifact

Posterior Fossa, vertebral arteries, paraspinal tissues: Small T2
hyperintensity in the pons attributed to chronic small vessel
disease.

Disc levels:

C2-3: Mild disc bulging and facet spurring

C3-4: Disc bulging with uncovertebral and facet spurring. Spinal
stenosis with ventral cord indentation. Biforaminal stenosis

C4-5: Disc narrowing and bulging. Ligamentum flavum thickening. Disc
material indents the ventral cord. Biforaminal stenosis.

C5-6: Disc narrowing and bulging with ligamentum flavum thickening.
Mild cord deformity. Mild bilateral foraminal stenosis

C6-7: Disc narrowing and bulging. Mild ventral cord indentation.
Negative facets. No neural impingement.

C7-T1:Unremarkable.
IMPRESSION: 1. The C3 body lesion remains size stable and solitary at 6 months,
compatible with a benign process.
2. Cervical spine degeneration with cord indentation at C3-4 to
C6-7. Foraminal narrowings at the same levels.
3. Truncated and motion degraded study due to patient discomfort. No
postcontrast imaging.

## 2023-04-18 ENCOUNTER — Inpatient Hospital Stay (HOSPITAL_COMMUNITY)
Admission: EM | Admit: 2023-04-18 | Discharge: 2023-04-20 | DRG: 690 | Disposition: A | Discharge status: Home / Self Care | Attending: Family Medicine | Admitting: Family Medicine

## 2023-04-18 ENCOUNTER — Other Ambulatory Visit: Payer: Self-pay

## 2023-04-18 ENCOUNTER — Encounter (HOSPITAL_COMMUNITY): Payer: Self-pay | Admitting: Family Medicine

## 2023-04-18 DIAGNOSIS — I10 Essential (primary) hypertension: Secondary | ICD-10-CM | POA: Diagnosis present

## 2023-04-18 DIAGNOSIS — Z87891 Personal history of nicotine dependence: Secondary | ICD-10-CM | POA: Diagnosis not present

## 2023-04-18 DIAGNOSIS — Z940 Kidney transplant status: Secondary | ICD-10-CM | POA: Diagnosis not present

## 2023-04-18 DIAGNOSIS — Z79621 Long term (current) use of calcineurin inhibitor: Secondary | ICD-10-CM

## 2023-04-18 DIAGNOSIS — R739 Hyperglycemia, unspecified: Secondary | ICD-10-CM | POA: Diagnosis not present

## 2023-04-18 DIAGNOSIS — E875 Hyperkalemia: Secondary | ICD-10-CM | POA: Diagnosis present

## 2023-04-18 DIAGNOSIS — B961 Klebsiella pneumoniae [K. pneumoniae] as the cause of diseases classified elsewhere: Secondary | ICD-10-CM | POA: Diagnosis present

## 2023-04-18 DIAGNOSIS — D539 Nutritional anemia, unspecified: Secondary | ICD-10-CM | POA: Diagnosis present

## 2023-04-18 DIAGNOSIS — D84821 Immunodeficiency due to drugs: Secondary | ICD-10-CM | POA: Diagnosis present

## 2023-04-18 DIAGNOSIS — G4733 Obstructive sleep apnea (adult) (pediatric): Secondary | ICD-10-CM | POA: Diagnosis present

## 2023-04-18 DIAGNOSIS — Z8249 Family history of ischemic heart disease and other diseases of the circulatory system: Secondary | ICD-10-CM | POA: Diagnosis not present

## 2023-04-18 DIAGNOSIS — I129 Hypertensive chronic kidney disease with stage 1 through stage 4 chronic kidney disease, or unspecified chronic kidney disease: Secondary | ICD-10-CM | POA: Diagnosis present

## 2023-04-18 DIAGNOSIS — D72829 Elevated white blood cell count, unspecified: Secondary | ICD-10-CM | POA: Diagnosis not present

## 2023-04-18 DIAGNOSIS — Z8719 Personal history of other diseases of the digestive system: Secondary | ICD-10-CM

## 2023-04-18 DIAGNOSIS — N3090 Cystitis, unspecified without hematuria: Principal | ICD-10-CM

## 2023-04-18 DIAGNOSIS — Z9049 Acquired absence of other specified parts of digestive tract: Secondary | ICD-10-CM

## 2023-04-18 DIAGNOSIS — N390 Urinary tract infection, site not specified: Secondary | ICD-10-CM | POA: Diagnosis present

## 2023-04-18 DIAGNOSIS — E7800 Pure hypercholesterolemia, unspecified: Secondary | ICD-10-CM | POA: Diagnosis present

## 2023-04-18 DIAGNOSIS — Z79899 Other long term (current) drug therapy: Secondary | ICD-10-CM | POA: Diagnosis not present

## 2023-04-18 DIAGNOSIS — Z8616 Personal history of COVID-19: Secondary | ICD-10-CM

## 2023-04-18 DIAGNOSIS — Z1152 Encounter for screening for COVID-19: Secondary | ICD-10-CM

## 2023-04-18 DIAGNOSIS — E860 Dehydration: Secondary | ICD-10-CM | POA: Diagnosis present

## 2023-04-18 DIAGNOSIS — Z96653 Presence of artificial knee joint, bilateral: Secondary | ICD-10-CM

## 2023-04-18 DIAGNOSIS — Z6841 Body Mass Index (BMI) 40.0 and over, adult: Secondary | ICD-10-CM | POA: Diagnosis not present

## 2023-04-18 DIAGNOSIS — N3000 Acute cystitis without hematuria: Principal | ICD-10-CM | POA: Diagnosis present

## 2023-04-18 DIAGNOSIS — K219 Gastro-esophageal reflux disease without esophagitis: Secondary | ICD-10-CM | POA: Diagnosis present

## 2023-04-18 DIAGNOSIS — N1831 Chronic kidney disease, stage 3a: Secondary | ICD-10-CM | POA: Diagnosis present

## 2023-04-18 DIAGNOSIS — Z96641 Presence of right artificial hip joint: Secondary | ICD-10-CM | POA: Diagnosis present

## 2023-04-18 DIAGNOSIS — Z791 Long term (current) use of non-steroidal anti-inflammatories (NSAID): Secondary | ICD-10-CM | POA: Diagnosis not present

## 2023-04-18 LAB — CBC WITH DIFFERENTIAL/PLATELET
Abs Immature Granulocytes: 0.04 10*3/uL (ref 0.00–0.07)
Basophils Absolute: 0 10*3/uL (ref 0.0–0.1)
Basophils Relative: 0 %
Eosinophils Absolute: 0.1 10*3/uL (ref 0.0–0.5)
Eosinophils Relative: 1 %
HCT: 36.6 % — ABNORMAL LOW (ref 39.0–52.0)
Hemoglobin: 10.8 g/dL — ABNORMAL LOW (ref 13.0–17.0)
Immature Granulocytes: 0 %
Lymphocytes Relative: 4 %
Lymphs Abs: 0.4 10*3/uL — ABNORMAL LOW (ref 0.7–4.0)
MCH: 29.9 pg (ref 26.0–34.0)
MCHC: 29.5 g/dL — ABNORMAL LOW (ref 30.0–36.0)
MCV: 101.4 fL — ABNORMAL HIGH (ref 80.0–100.0)
Monocytes Absolute: 0.7 10*3/uL (ref 0.1–1.0)
Monocytes Relative: 7 %
Neutro Abs: 9.4 10*3/uL — ABNORMAL HIGH (ref 1.7–7.7)
Neutrophils Relative %: 88 %
Platelets: 111 10*3/uL — ABNORMAL LOW (ref 150–400)
RBC: 3.61 MIL/uL — ABNORMAL LOW (ref 4.22–5.81)
RDW: 13.8 % (ref 11.5–15.5)
WBC: 10.6 10*3/uL — ABNORMAL HIGH (ref 4.0–10.5)
nRBC: 0 % (ref 0.0–0.2)

## 2023-04-18 LAB — COMPREHENSIVE METABOLIC PANEL
ALT: 25 U/L (ref 0–44)
AST: 21 U/L (ref 15–41)
Albumin: 3.8 g/dL (ref 3.5–5.0)
Alkaline Phosphatase: 56 U/L (ref 38–126)
Anion gap: 10 (ref 5–15)
BUN: 41 mg/dL — ABNORMAL HIGH (ref 8–23)
CO2: 20 mmol/L — ABNORMAL LOW (ref 22–32)
Calcium: 9.8 mg/dL (ref 8.9–10.3)
Chloride: 108 mmol/L (ref 98–111)
Creatinine, Ser: 1.4 mg/dL — ABNORMAL HIGH (ref 0.61–1.24)
GFR, Estimated: 53 mL/min — ABNORMAL LOW (ref >60)
Glucose, Bld: 127 mg/dL — ABNORMAL HIGH (ref 70–99)
Potassium: 5.2 mmol/L — ABNORMAL HIGH (ref 3.5–5.1)
Sodium: 138 mmol/L (ref 135–145)
Total Bilirubin: 1.2 mg/dL (ref 0.0–1.2)
Total Protein: 7.4 g/dL (ref 6.5–8.1)

## 2023-04-18 LAB — URINALYSIS, ROUTINE W REFLEX MICROSCOPIC
Bacteria, UA: NONE SEEN
Bilirubin Urine: NEGATIVE
Glucose, UA: NEGATIVE mg/dL
Ketones, ur: NEGATIVE mg/dL
Nitrite: NEGATIVE
Protein, ur: 30 mg/dL — AB
RBC / HPF: >50 RBC/hpf (ref 0–5)
Specific Gravity, Urine: 1.013 (ref 1.005–1.030)
WBC, UA: >50 WBC/hpf (ref 0–5)
pH: 5 (ref 5.0–8.0)

## 2023-04-18 LAB — RESP PANEL BY RT-PCR (RSV, FLU A&B, COVID)  RVPGX2
Influenza A by PCR: NEGATIVE
Influenza B by PCR: NEGATIVE
Resp Syncytial Virus by PCR: NEGATIVE
SARS Coronavirus 2 by RT PCR: NEGATIVE

## 2023-04-18 LAB — HEMOGLOBIN A1C
Hgb A1c MFr Bld: 6 % — ABNORMAL HIGH (ref 4.8–5.6)
Mean Plasma Glucose: 125.5 mg/dL

## 2023-04-18 MED ORDER — PREDNISONE 10 MG PO TABS
5.0000 mg | ORAL_TABLET | Freq: Every day | ORAL | Status: DC
  Administered 2023-04-19 – 2023-04-20 (×2): 5 mg via ORAL
  Filled 2023-04-18 (×2): qty 1

## 2023-04-18 MED ORDER — ONDANSETRON HCL 4 MG PO TABS: 4.0000 mg | ORAL_TABLET | Freq: Four times a day (QID) | ORAL | Status: DC | PRN

## 2023-04-18 MED ORDER — ORAL CARE MOUTH RINSE: 15.0000 mL | OROMUCOSAL | Status: DC | PRN

## 2023-04-18 MED ORDER — LORATADINE 10 MG PO TABS
10.0000 mg | ORAL_TABLET | Freq: Every day | ORAL | Status: DC
  Administered 2023-04-18 – 2023-04-19 (×2): 10 mg via ORAL
  Filled 2023-04-18 (×2): qty 1

## 2023-04-18 MED ORDER — DULOXETINE HCL 30 MG PO CPEP
60.0000 mg | ORAL_CAPSULE | Freq: Every day | ORAL | Status: DC
  Administered 2023-04-18 – 2023-04-19 (×2): 60 mg via ORAL
  Filled 2023-04-18 (×2): qty 2

## 2023-04-18 MED ORDER — OXYCODONE HCL 5 MG PO TABS: 5.0000 mg | ORAL_TABLET | Freq: Four times a day (QID) | ORAL | Status: DC | PRN

## 2023-04-18 MED ORDER — SODIUM CHLORIDE 0.9 % IV BOLUS
1000.0000 mL | Freq: Once | INTRAVENOUS | Status: AC
  Administered 2023-04-18: 1000 mL via INTRAVENOUS

## 2023-04-18 MED ORDER — METOPROLOL TARTRATE 25 MG PO TABS
25.0000 mg | ORAL_TABLET | Freq: Two times a day (BID) | ORAL | Status: DC
  Administered 2023-04-18 – 2023-04-20 (×4): 25 mg via ORAL
  Filled 2023-04-18 (×4): qty 1

## 2023-04-18 MED ORDER — LACTATED RINGERS IV SOLN: INTRAVENOUS | Status: DC

## 2023-04-18 MED ORDER — PANTOPRAZOLE SODIUM 40 MG PO TBEC
40.0000 mg | DELAYED_RELEASE_TABLET | Freq: Every evening | ORAL | Status: DC
  Administered 2023-04-18 – 2023-04-19 (×2): 40 mg via ORAL
  Filled 2023-04-18 (×2): qty 1

## 2023-04-18 MED ORDER — SODIUM CHLORIDE 0.9 % IV SOLN
2.0000 g | Freq: Once | INTRAVENOUS | Status: AC
  Administered 2023-04-18: 2 g via INTRAVENOUS
  Filled 2023-04-18: qty 20

## 2023-04-18 MED ORDER — HYDRALAZINE HCL 20 MG/ML IJ SOLN
10.0000 mg | INTRAMUSCULAR | Status: DC | PRN
  Filled 2023-04-18: qty 1

## 2023-04-18 MED ORDER — ENOXAPARIN SODIUM 80 MG/0.8ML IJ SOSY
0.5000 mg/kg | PREFILLED_SYRINGE | INTRAMUSCULAR | Status: DC
  Administered 2023-04-18: 77.5 mg via SUBCUTANEOUS
  Filled 2023-04-18: qty 0.8

## 2023-04-18 MED ORDER — MYCOPHENOLATE SODIUM 180 MG PO TBEC
360.0000 mg | DELAYED_RELEASE_TABLET | Freq: Two times a day (BID) | ORAL | Status: DC
  Administered 2023-04-18 – 2023-04-20 (×4): 360 mg via ORAL
  Filled 2023-04-18 (×6): qty 2

## 2023-04-18 MED ORDER — ACETAMINOPHEN 325 MG PO TABS
650.0000 mg | ORAL_TABLET | Freq: Four times a day (QID) | ORAL | Status: DC | PRN
  Administered 2023-04-19: 650 mg via ORAL
  Filled 2023-04-18: qty 2

## 2023-04-18 MED ORDER — PROCHLORPERAZINE EDISYLATE 10 MG/2ML IJ SOLN: 10.0000 mg | INTRAMUSCULAR | Status: DC | PRN

## 2023-04-18 MED ORDER — FENTANYL CITRATE PF 50 MCG/ML IJ SOSY: 12.5000 ug | PREFILLED_SYRINGE | INTRAMUSCULAR | Status: DC | PRN

## 2023-04-18 MED ORDER — TAMSULOSIN HCL 0.4 MG PO CAPS
0.4000 mg | ORAL_CAPSULE | Freq: Every day | ORAL | Status: DC
  Administered 2023-04-18 – 2023-04-19 (×2): 0.4 mg via ORAL
  Filled 2023-04-18 (×2): qty 1

## 2023-04-18 MED ORDER — SODIUM ZIRCONIUM CYCLOSILICATE 10 G PO PACK
10.0000 g | PACK | Freq: Three times a day (TID) | ORAL | Status: AC
  Administered 2023-04-18 (×2): 10 g via ORAL
  Filled 2023-04-18 (×2): qty 1

## 2023-04-18 MED ORDER — FLUTICASONE PROPIONATE 50 MCG/ACT NA SUSP: 2.0000 | Freq: Every day | NASAL | Status: DC | PRN

## 2023-04-18 MED ORDER — ONDANSETRON HCL 4 MG/2ML IJ SOLN: 4.0000 mg | Freq: Four times a day (QID) | INTRAMUSCULAR | Status: DC | PRN

## 2023-04-18 MED ORDER — TACROLIMUS 1 MG PO CAPS
1.5000 mg | ORAL_CAPSULE | Freq: Two times a day (BID) | ORAL | Status: DC
  Administered 2023-04-18 – 2023-04-20 (×4): 1.5 mg via ORAL
  Filled 2023-04-18 (×6): qty 1

## 2023-04-18 MED ORDER — LABETALOL HCL 5 MG/ML IV SOLN
10.0000 mg | INTRAVENOUS | Status: DC | PRN
  Administered 2023-04-18: 10 mg via INTRAVENOUS
  Filled 2023-04-18: qty 4

## 2023-04-18 MED ORDER — SIMVASTATIN 10 MG PO TABS: 20.0000 mg | ORAL_TABLET | Freq: Every day | ORAL | Status: DC

## 2023-04-18 MED ORDER — BISACODYL 5 MG PO TBEC: 5.0000 mg | DELAYED_RELEASE_TABLET | Freq: Every day | ORAL | Status: DC | PRN

## 2023-04-18 MED ORDER — HYDRALAZINE HCL 25 MG PO TABS
25.0000 mg | ORAL_TABLET | Freq: Two times a day (BID) | ORAL | Status: DC
  Administered 2023-04-18 – 2023-04-20 (×4): 25 mg via ORAL
  Filled 2023-04-18 (×4): qty 1

## 2023-04-18 MED ORDER — POLYSACCHARIDE IRON COMPLEX 150 MG PO CAPS
150.0000 mg | ORAL_CAPSULE | Freq: Every day | ORAL | Status: DC
  Administered 2023-04-19 – 2023-04-20 (×2): 150 mg via ORAL
  Filled 2023-04-18 (×2): qty 1

## 2023-04-18 MED ORDER — LOPERAMIDE HCL 2 MG PO CAPS: 2.0000 mg | ORAL_CAPSULE | Freq: Four times a day (QID) | ORAL | Status: DC | PRN

## 2023-04-18 MED ORDER — ACETAMINOPHEN 650 MG RE SUPP: 650.0000 mg | Freq: Four times a day (QID) | RECTAL | Status: DC | PRN

## 2023-04-18 NOTE — Progress Notes (Signed)
 PHARMACIST - PHYSICIAN COMMUNICATION  CONCERNING:  Enoxaparin (Lovenox) for DVT Prophylaxis   ASSESSMENT: Patient was prescribed enoxaparin 40 mg subcutaneously every 24 hours for VTE prophylaxis.   Body mass index is 48.06 kg/m.  Estimated Creatinine Clearance: 71.6 mL/min (A) (by C-G formula based on SCr of 1.4 mg/dL (H)).  Based on [facility] policy, patient qualifies for enoxaparin dosing of 0.5 mg per kilogram of total body weight every 24 hours because their body mass index is >30 kg/m2.  PLAN: Pharmacy has adjusted enoxaparin dose per [facility] policy.  Description: Patient is now receiving enoxaparin 0.5 mg/kg subcutaneously every 24 hours.  Will M. Dareen Piano, PharmD Clinical Pharmacist 04/18/2023 4:32 PM

## 2023-04-18 NOTE — H&P (Addendum)
 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  He started having symptoms of urinary frequency and dysuria yesterday.  He also started having chills and some flank pain.  No fever reported.  Patient has been having malaise and wife brought him into the emergency department to be evaluated.  He was noted to have an abnormal urinalysis consistent with infection.  His WBC was elevated at 10.6.  Hemoglobin 10.8.  His platelet count is 111.  His potassium was 5.2 and glucose 127.  His BUN was 41 and creatinine 1.40.  He was started on IV antibiotics, IV fluids and admission requested for further management.   Past Medical History:  Diagnosis Date   Acute respiratory failure with hypoxia (HCC) 02/19/2019   Anemia    Bleeding pseudoaneurysm of left brachiocephalic AV fistula, initial encounter 01/05/2017   Cataract    Chronic right hip pain    Difficult intravenous access 04/25/2019   ESRD (end stage renal disease) on dialysis [facility])    dialysis M/W/F- [facility] (01/06/2017)   GERD (gastroesophageal reflux disease)    Gout    High cholesterol    Hypertension    Neuropathy    Osteoarthritis    "all over" (01/06/2017)   Pleurisy     Pneumonia 1980s X 1   Pneumonia due to COVID-19 virus 02/20/2019   Sleep apnea     Past Surgical History:  Procedure Laterality Date   A/V FISTULAGRAM Left 12/24/2016   Procedure: A/V FISTULAGRAM;  Surgeon: Maeola Harman, MD;  Location: [facility] INVASIVE CV LAB;  Service: Cardiovascular;  Laterality: Left;   ANTERIOR INTEROSSEOUS NERVE DECOMPRESSION Left 12/13/2020   Procedure: LEFT ULNAR NEUROPLASTY AT THE ELBOW;  Surgeon: Mack Hook, MD;  Location: [facility] OR;  Service: Orthopedics;  Laterality: Left;  PRE-OP BLOCK   APPENDECTOMY  1963   AV FISTULA PLACEMENT Left 08/04/2016   Procedure: ARTERIOVENOUS (AV) FISTULA CREATION/LEFT ARM;  Surgeon: Nada Libman, MD;  Location: [facility] OR;  Service: Vascular;  Laterality: Left;   AV FISTULA REPAIR Left 01/06/2017   arm   BILATERAL CARPAL TUNNEL RELEASE Bilateral 2006-2007   right-left   CHOLECYSTECTOMY OPEN  1979   COLONOSCOPY     FINGER SURGERY Right 2006   "thumb; took out bone that conects to wrist and a piece of tendon; weaved tendon back in for mobility"   FISTULA SUPERFICIALIZATION Left 09/24/2016   Procedure: FISTULA SUPERFICIALIZATION- LEFT ARM;  Surgeon: Nada Libman, MD;  Location: [facility] OR;  Service: Vascular;  Laterality: Left;   HAMMER TOE SURGERY Left 1981   INSERTION OF DIALYSIS CATHETER Left 08/04/2016   Procedure: INSERTION OF DIALYSIS CATHETER;  Surgeon: Nada Libman, MD;  Location: [facility] OR;  Service: Vascular;  Laterality: Left;   JOINT REPLACEMENT     LIGATION  OF ARTERIOVENOUS  FISTULA Left 01/21/2017   Procedure: REPAIR OF ARTERIOVENOUS  FISTULA;  Surgeon: Chuck Hint, MD;  Location: [facility] OR;  Service: Vascular;  Laterality: Left;   LUMBAR LAMINECTOMY/DECOMPRESSION MICRODISCECTOMY N/A 08/18/2012   Procedure: LUMBAR LAMINECTOMY/DECOMPRESSION MICRODISCECTOMY 3 LEVELS;  Surgeon: Hewitt Shorts, MD;  Location: [facility] NEURO ORS;  Service: Neurosurgery;  Laterality: N/A;  Lumbar Two through Five Laminectomy   REVISON  OF ARTERIOVENOUS FISTULA Left 01/06/2017   Procedure: REVISON OF ARTERIOVENOUS FISTULA LEFT ARM;  Surgeon: Nada Libman, MD;  Location: [facility] OR;  Service: Vascular;  Laterality: Left;   SHOULDER OPEN ROTATOR CUFF REPAIR Right 2001   THROMBECTOMY AND REVISION OF ARTERIOVENTOUS (AV) GORETEX  GRAFT Left 01/17/2017   Procedure: REVISION OF ARTERIOVENTOUS (AV) FISTULA LEFT ARM;  Surgeon: Maeola Harman, MD;  Location: [facility] OR;  Service: Vascular;  Laterality: Left;   TOTAL HIP ARTHROPLASTY Right 07/26/2013   Procedure: TOTAL HIP ARTHROPLASTY;  Surgeon: Nestor Lewandowsky, MD;  Location: [facility] OR;  Service: Orthopedics;  Laterality: Right;   TOTAL KNEE ARTHROPLASTY Bilateral 2008-2010   right-left   WRIST TENDON TRANSFER Bilateral 1972-1973   left-right     reports that he quit smoking about 40 years ago. His smoking use included cigarettes. He started smoking about 50 years ago. He has a 10 pack-year smoking history. He has quit using smokeless tobacco.  His smokeless tobacco use included chew. He reports current alcohol use. He reports that he does not use drugs.  Allergies  Allergen Reactions   Cozaar [Losartan Potassium] Other (See Comments)    Stopped due to kidney decline 4/18   Kayexalate [Sodium Polystyrene Sulfonate] Other (See Comments)    Abnormal ekg   Cholestyramine Other (See Comments)    Due to Kidney function   Ramipril Cough        Sodium Hyaluronate (Non-Avian) Other (See Comments)    Synvisc - swelling   Sulfa Antibiotics Rash    Family History  Problem Relation Age of Onset   Heart disease Mother    Cancer Father    Colon polyps Father    Colon cancer Neg Hx    Esophageal cancer Neg Hx    Rectal cancer Neg Hx    Stomach cancer Neg Hx     Prior to Admission medications   Medication Sig Start Date End Date Taking? Authorizing Provider  cetirizine (ZYRTEC) 10 MG tablet Take 10 mg by mouth at bedtime.   Yes [provider]  fluticasone (FLONASE) 50  MCG/ACT nasal spray Place 2 sprays into both nostrils daily as needed for allergies or rhinitis.   Yes [provider]  loperamide (IMODIUM A-D) 2 MG tablet Take 2 mg by mouth 4 (four) times daily as needed for diarrhea or loose stools.   Yes [provider]  omeprazole (PRILOSEC OTC) 20 MG tablet Take 20 mg by mouth daily.   Yes [provider]  simvastatin (ZOCOR) 20 MG tablet Take 20 mg by mouth at bedtime. 11/12/20  Yes [provider]  tacrolimus (PROGRAF) 0.5 MG capsule Take 0.5 mg by mouth 2 (two) times daily.   Yes [provider]  tacrolimus (PROGRAF) 1 MG capsule Take 1 mg by mouth 2 (two) times daily.   Yes [provider]  tamsulosin (FLOMAX) 0.4 MG CAPS capsule Take 0.4 mg by mouth at bedtime.   Yes [provider]  DULoxetine (CYMBALTA) 60 MG capsule Take 60 mg by mouth at bedtime. 09/12/19   [provider]    Physical Exam: Vitals:   04/18/23 1247 04/18/23 1345 04/18/23 1400 04/18/23 1414  BP: (!) 157/53 (!) 151/73 (!) 171/64   Pulse: 60 60 64   Resp:      Temp:    98.4 F (36.9 C)  TempSrc:    Oral  SpO2: 94% 95% 95%     Constitutional: morbidly obese male, lying on gurney awake, alert, cooperative, NAD, calm, comfortable Eyes: PERRL, lids and conjunctivae normal ENMT: Mucous membranes are pale but dry. Posterior pharynx clear of any exudate or lesions.Normal dentition.  Neck: normal, supple, no masses, no thyromegaly Respiratory: clear to auscultation bilaterally, no wheezing, no crackles. Normal respiratory effort. No accessory muscle use.  Cardiovascular: normal s1, s2 sounds, no murmurs / rubs / gallops. No extremity edema. 2+ pedal pulses. No carotid bruits.  Abdomen: obese, no tenderness, no masses palpated. No hepatosplenomegaly. Bowel sounds positive.  Musculoskeletal: no clubbing / cyanosis. No joint deformity upper and lower extremities. Good ROM, no contractures. Normal muscle tone.  Skin:  no rashes, lesions, ulcers. No induration Neurologic: CN 2-12 grossly intact. Sensation intact, DTR normal. Strength 5/5 in all 4.  Psychiatric: Normal judgment and insight. Alert and oriented x 3. Normal mood.   Labs on Admission: I have personally reviewed following labs and imaging studies  CBC: Recent Labs  Lab 04/18/23 1220  WBC 10.6*  NEUTROABS 9.4*  HGB 10.8*  HCT 36.6*  MCV 101.4*  PLT 111*   Basic Metabolic Panel: Recent Labs  Lab 04/18/23 1220  NA 138  K 5.2*  CL 108  CO2 20*  GLUCOSE 127*  BUN 41*  CREATININE 1.40*  CALCIUM 9.8   GFR: CrCl cannot be calculated (Unknown ideal weight.). Liver Function Tests: Recent Labs  Lab 04/18/23 1220  AST 21  ALT 25  ALKPHOS 56  BILITOT 1.2  PROT 7.4  ALBUMIN 3.8   No results for input(s): "LIPASE", "AMYLASE" in the last 168 hours. No results for input(s): "AMMONIA" in the last 168 hours. Coagulation Profile: No results for input(s): "INR", "PROTIME" in the last 168 hours. Cardiac Enzymes: No results for input(s): "CKTOTAL", "CKMB", "CKMBINDEX", "TROPONINI" in the last 168 hours. BNP (last 3 results) No results for input(s): "PROBNP" in the last 8760 hours. HbA1C: No results for input(s): "HGBA1C" in the last 72 hours. CBG: No results for input(s): "GLUCAP" in the last 168 hours. Lipid Profile: No results for input(s): "CHOL", "HDL", "LDLCALC", "TRIG", "CHOLHDL", "LDLDIRECT" in the last 72 hours. Thyroid Function Tests: No results for input(s): "TSH", "T4TOTAL", "FREET4", "T3FREE", "THYROIDAB" in the last 72 hours. Anemia Panel: No results for input(s): "VITAMINB12", "FOLATE", "FERRITIN", "TIBC", "IRON", "RETICCTPCT" in the last 72 hours. Urine analysis:    Component Value Date/Time   COLORURINE YELLOW 04/18/2023 1103   APPEARANCEUR HAZY (A) 04/18/2023 1103   LABSPEC 1.013 04/18/2023 1103   PHURINE 5.0 04/18/2023 1103   GLUCOSEU NEGATIVE 04/18/2023 1103   HGBUR LARGE (A) 04/18/2023 1103   BILIRUBINUR  NEGATIVE 04/18/2023 1103   KETONESUR NEGATIVE 04/18/2023 1103   PROTEINUR 30 (A) 04/18/2023 1103   UROBILINOGEN 0.2 07/18/2013 1248   NITRITE NEGATIVE 04/18/2023 1103   LEUKOCYTESUR MODERATE (A) 04/18/2023 1103    Radiological Exams on Admission: No results found.  Assessment/Plan Principal Problem:   UTI (urinary tract infection) Active Problems:   Hypertension   History of total hip replacement, right   Hx of total knee replacement, bilateral   History of gastroesophageal reflux (GERD)   Hyperkalemia  Hyperglycemia   Leukocytosis   Macrocytic anemia   Urinary Tract Infection  - as evidenced by abnormal findings on urinalysis - unfortunately blood cultures not obtained prior to antibiotics given, I tried to order anyway but lab techs tried 3 times and were unable to get due to patient being a difficult stick.  - continue IV ceftriaxone 2 gm every 24 hours - continue IV fluid hydration as ordered - follow urine culture sent to lab   History of Renal Transplant 12/2021 - pt reports he is followed at [facility]  - resume home antirejection medication to decrease risk of acute cellular rejection  -  the risks of discontinuing kidney antirejection medication during hospitalization for a UTI include acute rejection, deterioration of graft function, and lack of clinical benefit in terms of infection outcomes. Therefore, maintaining immunosuppressive therapy, with close monitoring and potential adjustments based on the patient's condition, is generally recommended  Hyperkalemia - this has been chronic per family - he has been prescribed daily valtessa by his nephrologist - we will treat him with lokelma while inpatient  - follow daily BMP  - holding home ARB temporarily   OSA  - he uses nightly CPAP - will offer nightly CPAP while in hospital   Macrocytic Anemia  - check anemia panel - follow CBC   Leukocytosis  - presumably secondary to daily prednisone therapy -  follow CBC   GERD - pantoprazole ordered for GI protection   Dehydration  - IV fluid hydration ordered   CKD stage 3a  - GFR 53   Essential Hypertension  - elevated BPs noted - resume home hydralazine and metoprolol - IV labetalol and hydralazine ordered as needed - pending renal function and potassium tomorrow, resume home ARB   DVT prophylaxis: enoxaparin  Code Status: Full   Family Communication: wife at bedside   Disposition Plan: anticipate home   Consults called:   Admission status: INP  Level of care: Med-Surg Standley Dakins MD [facility] How to contact the [facility] Attending or Consulting provider 7A - 7P or covering provider during after hours 7P -7A, for this patient?  Check the care team in [facility] and look for a) attending/consulting [facility] provider listed and b) the [facility] team listed Log into [email or web address] and use [facility] universal password to access. If you do not have the password, please contact the hospital operator. Locate the [facility] provider you are looking for under [facility] and page to a number that you can be directly reached. If you still have difficulty reaching the provider, please page the [facility] (Director on Call) for the Hospitalists listed on amion for assistance.   If 7PM-7AM, please contact night-coverage [email or web address] Password [facility]  04/18/2023, 3:04 PM

## 2023-04-18 NOTE — ED Provider Notes (Signed)
 [place] EMERGENCY DEPARTMENT AT [facility] Provider Note   CSN: [id number] Arrival date & time: 04/18/23  1026     History  Chief Complaint  Patient presents with   Urinary Frequency    Richard Bush is a 73 y.o. male.  Patient has a history of renal transplant 2 years ago.  Patient complains of dysuria and flank pain  The history is provided by the patient, medical records and the spouse. No language interpreter was used.  Urinary Frequency This is a new problem. The current episode started 6 to 12 hours ago. The problem occurs constantly. The problem has not changed since onset.Pertinent negatives include no chest pain, no abdominal pain and no headaches. Nothing aggravates the symptoms. Nothing relieves the symptoms. He has tried nothing for the symptoms.       Home Medications Prior to Admission medications   Medication Sig Start Date End Date Taking? Authorizing Provider  acetaminophen (TYLENOL) 650 MG CR tablet Take 1,300 mg by mouth 2 (two) times daily.    [provider]  cetirizine (ZYRTEC) 10 MG tablet Take 10 mg by mouth at bedtime.    [provider]  cinacalcet (SENSIPAR) 30 MG tablet Take 60 mg by mouth every other day. 04/07/19   [provider]  COLCRYS 0.6 MG tablet TAKE 1 TABLET BY MOUTH ONCE DAILY AS NEEDED FOR GOUT FLARE UPS Patient taking differently: Take 0.6 mg by mouth daily as needed (gout flare ups). 07/24/20   Gearldine Bienenstock, PA-C  docusate sodium (COLACE) 100 MG capsule Take 500 mg by mouth daily with supper.    [provider]  DULoxetine (CYMBALTA) 60 MG capsule Take 60 mg by mouth at bedtime. 09/12/19   [provider]  febuxostat (ULORIC) 40 MG tablet Take 1/2 (one-half) tablet by mouth once daily 04/07/21   Gearldine Bienenstock, PA-C  folic acid-vitamin b complex-vitamin c-selenium-zinc (DIALYVITE) 3 MG TABS tablet Take 1 tablet by mouth every evening.     [provider]  gabapentin  (NEURONTIN) 300 MG capsule Take 300 mg by mouth at bedtime.    [provider]  meloxicam (MOBIC) 15 MG tablet Take 15 mg by mouth daily with supper. 11/01/20   [provider]  Menthol, Topical Analgesic, (BIOFREEZE) 10 % CREA Apply 1 application topically daily as needed (foot neuropathy).    [provider]  omeprazole (PRILOSEC) 20 MG capsule Take 20 mg by mouth every morning.    [provider]  oxyCODONE (ROXICODONE) 5 MG immediate release tablet Take 1 tablet (5 mg total) by mouth every 6 (six) hours as needed for severe pain. 12/13/20   Mack Hook, MD  sevelamer carbonate (RENVELA) 800 MG tablet Take 4,000 mg by mouth 3 (three) times daily with meals. 06/29/18   [provider]  simvastatin (ZOCOR) 20 MG tablet Take 20 mg by mouth at bedtime. 11/12/20   [provider]      Allergies    Cozaar [losartan potassium], Kayexalate [sodium polystyrene sulfonate], Cholestyramine, Ramipril, Sodium hyaluronate (non-avian), and Sulfa antibiotics    Review of Systems   Review of Systems  Constitutional:  Negative for appetite change and fatigue.  HENT:  Negative for congestion, ear discharge and sinus pressure.   Eyes:  Negative for discharge.  Respiratory:  Negative for cough.   Cardiovascular:  Negative for chest pain.  Gastrointestinal:  Negative for abdominal pain and diarrhea.  Genitourinary:  Positive for flank pain and frequency. Negative for  hematuria.  Musculoskeletal:  Negative for back pain.  Skin:  Negative for rash.  Neurological:  Negative for seizures and headaches.  Psychiatric/Behavioral:  Negative for hallucinations.     Physical Exam Updated Vital Signs BP (!) 171/64   Pulse 64   Temp 98.4 F (36.9 C) (Oral)   Resp 18   SpO2 95%  Physical Exam Vitals and nursing note reviewed.  Constitutional:      Appearance: He is well-developed.  HENT:     Head: Normocephalic.     Nose: Nose normal.  Eyes:      General: No scleral icterus.    Conjunctiva/sclera: Conjunctivae normal.  Neck:     Thyroid: No thyromegaly.  Cardiovascular:     Rate and Rhythm: Normal rate and regular rhythm.     Heart sounds: No murmur heard.    No friction rub. No gallop.  Pulmonary:     Breath sounds: No stridor. No wheezing or rales.  Chest:     Chest wall: No tenderness.  Abdominal:     General: There is no distension.     Tenderness: There is no abdominal tenderness. There is no rebound.  Musculoskeletal:        General: Normal range of motion.     Cervical back: Neck supple.     Comments: Tender left flank  Lymphadenopathy:     Cervical: No cervical adenopathy.  Skin:    Findings: No erythema or rash.  Neurological:     Mental Status: He is alert and oriented to person, place, and time.     Motor: No abnormal muscle tone.     Coordination: Coordination normal.  Psychiatric:        Behavior: Behavior normal.     ED Results / Procedures / Treatments   Labs (all labs ordered are listed, but only abnormal results are displayed) Labs Reviewed  CBC WITH DIFFERENTIAL/PLATELET - Abnormal; Notable for the following components:      Result Value   WBC 10.6 (*)    RBC 3.61 (*)    Hemoglobin 10.8 (*)    HCT 36.6 (*)    MCV 101.4 (*)    MCHC 29.5 (*)    Platelets 111 (*)    Neutro Abs 9.4 (*)    Lymphs Abs 0.4 (*)    All other components within normal limits  COMPREHENSIVE METABOLIC PANEL - Abnormal; Notable for the following components:   Potassium 5.2 (*)    CO2 20 (*)    Glucose, Bld 127 (*)    BUN 41 (*)    Creatinine, Ser 1.40 (*)    GFR, Estimated 53 (*)    All other components within normal limits  URINALYSIS, ROUTINE W REFLEX MICROSCOPIC - Abnormal; Notable for the following components:   APPearance HAZY (*)    Hgb urine dipstick LARGE (*)    Protein, ur 30 (*)    Leukocytes,Ua MODERATE (*)    All other components within normal limits  URINE CULTURE  CULTURE, BLOOD (ROUTINE X 2)   CULTURE, BLOOD (ROUTINE X 2)  RESP PANEL BY RT-PCR (RSV, FLU A&B, COVID)  RVPGX2  LACTIC ACID, PLASMA  LACTIC ACID, PLASMA    EKG None  Radiology No results found.  Procedures Procedures    Medications Ordered in ED Medications  cefTRIAXone (ROCEPHIN) 2 g in sodium chloride 0.9 % 100 mL IVPB (2 g Intravenous New Bag/Given 04/18/23 1439)  sodium chloride 0.9 % bolus 1,000 mL (1,000 mLs Intravenous New Bag/Given 04/18/23  1415)    ED Course/ Medical Decision Making/ A&P CRITICAL CARE Performed by: Bethann Berkshire Total critical care time: 45 minutes Critical care time was exclusive of separately billable procedures and treating other patients. Critical care was necessary to treat or prevent imminent or life-threatening deterioration. Critical care was time spent personally by me on the following activities: development of treatment plan with patient and/or surrogate as well as nursing, discussions with consultants, evaluation of patient's response to treatment, examination of patient, obtaining history from patient or surrogate, ordering and performing treatments and interventions, ordering and review of laboratory studies, ordering and review of radiographic studies, pulse oximetry and re-evaluation of patient's condition.    Click here for ABCD2, HEART and other calculatorsREFRESH Note before signing :1}                              Medical Decision Making Amount and/or Complexity of Data Reviewed Labs: ordered.  Risk Decision regarding hospitalization.  Patient with urinary tract infection and possible early sepsis.  He is started on antibiotics and admitted to medicine        Final Clinical Impression(s) / ED Diagnoses Final diagnoses:  Cystitis    Rx / DC Orders ED Discharge Orders     None         Bethann Berkshire, MD 04/18/23 [phone]

## 2023-04-18 NOTE — Hospital Course (Signed)
 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  He started having symptoms of urinary frequency and dysuria yesterday.  He also started having chills and some flank pain.  No fever reported.  Patient has been having malaise and wife brought him into the emergency department to be evaluated.  He was noted to have an abnormal urinalysis consistent with infection.  His WBC was elevated at 10.6.  Hemoglobin 10.8.  His platelet count is 111.  His potassium was 5.2 and glucose 127.  His BUN was 41 and creatinine 1.40.  He was started on IV antibiotics, IV fluids and admission requested for further management.

## 2023-04-18 NOTE — ED Triage Notes (Signed)
 Pt endorse urinary frequency and burning symptoms started yesterday morning. Denies blood in urine. Hx of kidney transplant and currently taking anti rejection meds. Endorses SOB. Denies CP

## 2023-04-19 DIAGNOSIS — R739 Hyperglycemia, unspecified: Secondary | ICD-10-CM | POA: Diagnosis not present

## 2023-04-19 DIAGNOSIS — D72829 Elevated white blood cell count, unspecified: Secondary | ICD-10-CM | POA: Diagnosis not present

## 2023-04-19 DIAGNOSIS — N390 Urinary tract infection, site not specified: Secondary | ICD-10-CM | POA: Diagnosis not present

## 2023-04-19 DIAGNOSIS — E875 Hyperkalemia: Secondary | ICD-10-CM

## 2023-04-19 LAB — CBC WITH DIFFERENTIAL/PLATELET
Abs Immature Granulocytes: 0.04 10*3/uL (ref 0.00–0.07)
Basophils Absolute: 0 10*3/uL (ref 0.0–0.1)
Basophils Relative: 0 %
Eosinophils Absolute: 0 10*3/uL (ref 0.0–0.5)
Eosinophils Relative: 0 %
HCT: 31.6 % — ABNORMAL LOW (ref 39.0–52.0)
Hemoglobin: 9.6 g/dL — ABNORMAL LOW (ref 13.0–17.0)
Immature Granulocytes: 0 %
Lymphocytes Relative: 4 %
Lymphs Abs: 0.4 10*3/uL — ABNORMAL LOW (ref 0.7–4.0)
MCH: 30.4 pg (ref 26.0–34.0)
MCHC: 30.4 g/dL (ref 30.0–36.0)
MCV: 100 fL (ref 80.0–100.0)
Monocytes Absolute: 0.6 10*3/uL (ref 0.1–1.0)
Monocytes Relative: 6 %
Neutro Abs: 8.4 10*3/uL — ABNORMAL HIGH (ref 1.7–7.7)
Neutrophils Relative %: 90 %
Platelets: 100 10*3/uL — ABNORMAL LOW (ref 150–400)
RBC: 3.16 MIL/uL — ABNORMAL LOW (ref 4.22–5.81)
RDW: 13.7 % (ref 11.5–15.5)
WBC: 9.4 10*3/uL (ref 4.0–10.5)
nRBC: 0 % (ref 0.0–0.2)

## 2023-04-19 LAB — BASIC METABOLIC PANEL
Anion gap: 7 (ref 5–15)
BUN: 37 mg/dL — ABNORMAL HIGH (ref 8–23)
CO2: 19 mmol/L — ABNORMAL LOW (ref 22–32)
Calcium: 9.1 mg/dL (ref 8.9–10.3)
Chloride: 111 mmol/L (ref 98–111)
Creatinine, Ser: 1.4 mg/dL — ABNORMAL HIGH (ref 0.61–1.24)
GFR, Estimated: 53 mL/min — ABNORMAL LOW (ref >60)
Glucose, Bld: 112 mg/dL — ABNORMAL HIGH (ref 70–99)
Potassium: 4.9 mmol/L (ref 3.5–5.1)
Sodium: 137 mmol/L (ref 135–145)

## 2023-04-19 LAB — IRON AND TIBC
Iron: 12 ug/dL — ABNORMAL LOW (ref 45–182)
Saturation Ratios: 5 % — ABNORMAL LOW (ref 17.9–39.5)
TIBC: 254 ug/dL (ref 250–450)
UIBC: 242 ug/dL

## 2023-04-19 LAB — FOLATE: Folate: 5.8 ng/mL — ABNORMAL LOW (ref >5.9)

## 2023-04-19 LAB — VITAMIN B12: Vitamin B-12: 327 pg/mL (ref 180–914)

## 2023-04-19 LAB — MAGNESIUM: Magnesium: 1.7 mg/dL (ref 1.7–2.4)

## 2023-04-19 LAB — RETICULOCYTES
Immature Retic Fract: 14.6 % (ref 2.3–15.9)
RBC.: 3.19 MIL/uL — ABNORMAL LOW (ref 4.22–5.81)
Retic Count, Absolute: 45 10*3/uL (ref 19.0–186.0)
Retic Ct Pct: 1.4 % (ref 0.4–3.1)

## 2023-04-19 LAB — FERRITIN: Ferritin: 217 ng/mL (ref 24–336)

## 2023-04-19 LAB — PHOSPHORUS: Phosphorus: 2.6 mg/dL (ref 2.5–4.6)

## 2023-04-19 MED ORDER — MAGNESIUM SULFATE 2 GM/50ML IV SOLN
2.0000 g | Freq: Once | INTRAVENOUS | Status: AC
  Administered 2023-04-19: 2 g via INTRAVENOUS
  Filled 2023-04-19: qty 50

## 2023-04-19 MED ORDER — SODIUM CHLORIDE 0.9 % IV SOLN
2.0000 g | INTRAVENOUS | Status: DC
  Administered 2023-04-19 – 2023-04-20 (×2): 2 g via INTRAVENOUS
  Filled 2023-04-19 (×2): qty 20

## 2023-04-19 MED ORDER — ENOXAPARIN SODIUM 80 MG/0.8ML IJ SOSY
80.0000 mg | PREFILLED_SYRINGE | INTRAMUSCULAR | Status: DC
  Administered 2023-04-19: 80 mg via SUBCUTANEOUS
  Filled 2023-04-19: qty 0.8

## 2023-04-19 MED ORDER — FUROSEMIDE 40 MG PO TABS
40.0000 mg | ORAL_TABLET | Freq: Every day | ORAL | Status: DC
  Administered 2023-04-19 – 2023-04-20 (×2): 40 mg via ORAL
  Filled 2023-04-19 (×2): qty 1

## 2023-04-19 MED ORDER — LOSARTAN POTASSIUM 50 MG PO TABS
50.0000 mg | ORAL_TABLET | Freq: Every day | ORAL | Status: DC
  Administered 2023-04-19 – 2023-04-20 (×2): 50 mg via ORAL
  Filled 2023-04-19 (×2): qty 1

## 2023-04-19 NOTE — Progress Notes (Signed)
   04/19/23 1007  TOC Brief Assessment  Insurance and Status Reviewed  Patient has primary care physician Yes  Home environment has been reviewed from home  Prior level of function: independent  Prior/Current Home Services No current home services  Social Drivers of Health Review SDOH reviewed no interventions necessary  Readmission risk has been reviewed Yes  Transition of care needs no transition of care needs at this time    Reviewed pt's record and discussed pt status with MD in progression rounds. No immediate TOC needs identified. Will follow and assist if needs arise.

## 2023-04-19 NOTE — Plan of Care (Signed)

## 2023-04-19 NOTE — Progress Notes (Signed)
 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  He started having symptoms of urinary frequency and dysuria yesterday.  He also started having chills and some flank pain.  No fever reported.  Patient has been having malaise and wife brought him into the emergency department to be evaluated.  He was noted to have an abnormal urinalysis consistent with infection.  His WBC was elevated at 10.6.  Hemoglobin 10.8.  His platelet count is 111.  His potassium was 5.2 and glucose 127.  His BUN was 41 and creatinine 1.40.  He was started on IV antibiotics, IV fluids and admission requested for further management.   Assessment and Plan:  Urinary Tract Infection Acute cystitis  - as evidenced by abnormal findings on urinalysis - unfortunately blood cultures not obtained prior to antibiotics given, I tried to order anyway but lab techs tried 3 times and were unable to get due to patient being a difficult stick.  - continue IV ceftriaxone 2 gm every 24 hours - gentle IV fluid hydration completed  - follow urine culture sent to lab    History of Renal Transplant 12/2021 - pt reports he is followed at [facility]  - resumed home antirejection medication to decrease risk of acute cellular rejection  -  the risks of discontinuing kidney antirejection medication during  hospitalization for a UTI include acute rejection, deterioration of graft function, and lack of clinical benefit in terms of infection outcomes. Therefore, maintaining immunosuppressive therapy, with close monitoring and potential adjustments based on the patient's condition, is generally recommended   Hyperkalemia - this has been chronic per family - he has been prescribed daily valtessa by his nephrologist - we treated him with lokelma while inpatient and K down to 4.9 today - follow daily BMP  - held home ARB temporarily    OSA  - he uses nightly CPAP - offered nightly CPAP while in hospital    Macrocytic Anemia  - check anemia panel - follow CBC    Leukocytosis  - presumably secondary to daily prednisone therapy - follow CBC    GERD - pantoprazole ordered for GI protection    Dehydration  - IV fluid hydration completed    CKD stage 3a  - GFR 53    Essential Hypertension  - resumed home hydralazine and metoprolol - IV labetalol and hydralazine ordered as needed - given current renal function and potassium today, resumed home ARB  DVT prophylaxis: enoxaparin Code Status: Full  Family Communication: wife at bedside  Disposition: anticipate home in 1-2 days    Consultants:   Procedures:   Antimicrobials:  Ceftriaxone 2 gm 3/2>>   Subjective: Pt reports less dysuria symptom when urinating today.  No recent chills.   Objective: Vitals:   04/18/23 1800 04/18/23 2112 04/18/23 2317 04/19/23 0554  BP: (!) 156/56 (!) 140/58  (!) 140/49  Pulse: 67 66 69  Resp:  18 (!) 24 20  Temp:  98.1 F (36.7 C)  98.8 F (37.1 C)  TempSrc:  Oral  Axillary  SpO2:  96% 97% 98%  Weight:      Height:        Intake/Output Summary (Last 24 hours) at 04/19/2023 1419 Last data filed at 04/19/2023 0802 Gross per 24 hour  Intake 110.58 ml  Output 800 ml  Net -689.42 ml   Filed Weights   04/18/23 1510  Weight: (!) 156.3 kg   Examination:  General exam: morbidly obese male,  sitting up in bed; Appears calm and comfortable  Respiratory system: Clear to auscultation. Respiratory effort normal. Cardiovascular system: normal S1 & S2 heard. No JVD, murmurs, rubs, gallops or clicks. No pedal edema. Gastrointestinal system: Abdomen is nondistended, soft and nontender. No organomegaly or masses felt. Normal bowel sounds heard. Central nervous system: Alert and oriented. No focal neurological deficits. Extremities: Symmetric 5 x 5 power. Skin: No rashes, lesions or ulcers. Psychiatry: Judgement and insight appear normal. Mood & affect appropriate.   Data Reviewed: I have personally reviewed following labs and imaging studies  CBC: Recent Labs  Lab 04/18/23 1220 04/19/23 0457  WBC 10.6* 9.4  NEUTROABS 9.4* 8.4*  HGB 10.8* 9.6*  HCT 36.6* 31.6*  MCV 101.4* 100.0  PLT 111* 100*    Basic Metabolic Panel: Recent Labs  Lab 04/18/23 1220 04/19/23 0457  NA 138 137  K 5.2* 4.9  CL 108 111  CO2 20* 19*  GLUCOSE 127* 112*  BUN 41* 37*  CREATININE 1.40* 1.40*  CALCIUM 9.8 9.1  MG  --  1.7  PHOS  --  2.6    CBG: No results for input(s): "GLUCAP" in the last 168 hours.  Recent Results (from the past 240 hours)  Urine Culture     Status: Abnormal (Preliminary result)   Collection Time: 04/18/23 11:03 AM   Specimen: Urine, Clean Catch  Result Value Ref Range Status   Specimen Description   Final    URINE, CLEAN CATCH Performed at [facility], [address], [place], [place] [address]    Special Requests   Final    NONE Performed at [facility], [address] [place], [place] [address]    Culture (A)  Final    >=100,000 COLONIES/mL KLEBSIELLA PNEUMONIAE SUSCEPTIBILITIES TO FOLLOW Performed at [facility], [address], [place], [place] [address]    Report Status PENDING  Incomplete  Resp panel by RT-PCR (RSV, Flu A&B, Covid) Anterior Nasal Swab     Status: None   Collection Time: 04/18/23  2:46 PM   Specimen: Anterior Nasal Swab   Result Value Ref Range Status   SARS Coronavirus 2 by RT PCR NEGATIVE NEGATIVE Final    Comment: (NOTE) SARS-CoV-2 target nucleic acids are NOT DETECTED.  The SARS-CoV-2 RNA is generally detectable in upper respiratory specimens during the acute phase of infection. The lowest concentration of SARS-CoV-2 viral copies this assay can detect is 138 copies/mL. A negative result does not preclude SARS-Cov-2 infection and should not be used as the sole basis for treatment or other patient management decisions. A negative result may occur with  improper specimen collection/handling, submission of specimen other than nasopharyngeal swab, presence of viral mutation(s) within the areas targeted by this assay, and inadequate number of viral copies(<138 copies/mL). A negative result must be combined with clinical observations, patient history, and epidemiological information. The expected result is Negative.  Fact Sheet for Patients:  BloggerCourse.com  Fact Sheet for Healthcare Providers:  SeriousBroker.it  This test is no t yet approved or cleared by the [place] FDA and  has been authorized for detection and/or diagnosis of SARS-CoV-2 by FDA under an Emergency Use Authorization (EUA). This EUA will remain  in effect (meaning this test can be used) for the duration of the COVID-19 declaration under Section 564(b)(1) of the Act, 21 U.S.C.section 360bbb-3(b)(1), unless the authorization is terminated  or revoked sooner.       Influenza A by PCR NEGATIVE NEGATIVE Final   Influenza B by PCR NEGATIVE NEGATIVE Final    Comment: (NOTE) The Xpert Xpress SARS-CoV-2/FLU/RSV plus assay is intended as an aid in the diagnosis of influenza from Nasopharyngeal swab specimens and should not be used as a sole basis for treatment. Nasal washings and aspirates are unacceptable for Xpert Xpress SARS-CoV-2/FLU/RSV testing.  Fact Sheet for  Patients: BloggerCourse.com  Fact Sheet for Healthcare Providers: SeriousBroker.it  This test is not yet approved or cleared by the [place] FDA and has been authorized for detection and/or diagnosis of SARS-CoV-2 by FDA under an Emergency Use Authorization (EUA). This EUA will remain in effect (meaning this test can be used) for the duration of the COVID-19 declaration under Section 564(b)(1) of the Act, 21 U.S.C. section 360bbb-3(b)(1), unless the authorization is terminated or revoked.     Resp Syncytial Virus by PCR NEGATIVE NEGATIVE Final    Comment: (NOTE) Fact Sheet for Patients: BloggerCourse.com  Fact Sheet for Healthcare Providers: SeriousBroker.it  This test is not yet approved or cleared by the [place] FDA and has been authorized for detection and/or diagnosis of SARS-CoV-2 by FDA under an Emergency Use Authorization (EUA). This EUA will remain in effect (meaning this test can be used) for the duration of the COVID-19 declaration under Section 564(b)(1) of the Act, 21 U.S.C. section 360bbb-3(b)(1), unless the authorization is terminated or revoked.  Performed at [facility], [address] [place], [place] [address]      Radiology Studies: No results found.  Scheduled Meds:  DULoxetine  60 mg Oral QHS   enoxaparin (LOVENOX) injection  80 mg Subcutaneous Q24H   furosemide  40 mg Oral Daily   hydrALAZINE  25 mg Oral BID   iron polysaccharides  150 mg Oral Q breakfast   loratadine  10 mg Oral QHS   losartan  50 mg Oral Daily   metoprolol tartrate  25 mg Oral BID   mycophenolate  360 mg Oral BID   pantoprazole  40 mg Oral QPM   predniSONE  5 mg Oral Daily   simvastatin  20 mg Oral QHS   tacrolimus  1.5 mg Oral BID   tamsulosin  0.4 mg Oral QHS   Continuous Infusions:  cefTRIAXone (ROCEPHIN)  IV 2 g (04/19/23 1042)   lactated ringers 70 mL/hr at  04/18/23 1816     LOS: 1 day   Time spent: 55 mins  Quynn Vilchis Laural Benes, MD How to contact the [facility] Attending or Consulting provider 7A - 7P or covering provider during after hours 7P -7A, for this patient?  Check the care team in [facility] and look for a) attending/consulting [facility] provider listed and b) the [facility] team listed Log into [email or web address] to find provider on call.  Locate the [facility] provider you are looking for under [facility] and page to a number that you can be directly reached. If you still have difficulty reaching the provider, please page the [facility] (Director on Call)  for the Hospitalists listed on amion for assistance.  04/19/2023, 2:19 PM

## 2023-04-20 DIAGNOSIS — N390 Urinary tract infection, site not specified: Secondary | ICD-10-CM | POA: Diagnosis not present

## 2023-04-20 DIAGNOSIS — D72829 Elevated white blood cell count, unspecified: Secondary | ICD-10-CM | POA: Diagnosis not present

## 2023-04-20 DIAGNOSIS — E875 Hyperkalemia: Secondary | ICD-10-CM | POA: Diagnosis not present

## 2023-04-20 LAB — CBC
HCT: 32.2 % — ABNORMAL LOW (ref 39.0–52.0)
Hemoglobin: 9.6 g/dL — ABNORMAL LOW (ref 13.0–17.0)
MCH: 30 pg (ref 26.0–34.0)
MCHC: 29.8 g/dL — ABNORMAL LOW (ref 30.0–36.0)
MCV: 100.6 fL — ABNORMAL HIGH (ref 80.0–100.0)
Platelets: 100 10*3/uL — ABNORMAL LOW (ref 150–400)
RBC: 3.2 MIL/uL — ABNORMAL LOW (ref 4.22–5.81)
RDW: 13.7 % (ref 11.5–15.5)
WBC: 7.5 10*3/uL (ref 4.0–10.5)
nRBC: 0 % (ref 0.0–0.2)

## 2023-04-20 LAB — BASIC METABOLIC PANEL
Anion gap: 4 — ABNORMAL LOW (ref 5–15)
BUN: 40 mg/dL — ABNORMAL HIGH (ref 8–23)
CO2: 20 mmol/L — ABNORMAL LOW (ref 22–32)
Calcium: 8.8 mg/dL — ABNORMAL LOW (ref 8.9–10.3)
Chloride: 109 mmol/L (ref 98–111)
Creatinine, Ser: 1.58 mg/dL — ABNORMAL HIGH (ref 0.61–1.24)
GFR, Estimated: 46 mL/min — ABNORMAL LOW (ref >60)
Glucose, Bld: 103 mg/dL — ABNORMAL HIGH (ref 70–99)
Potassium: 4.7 mmol/L (ref 3.5–5.1)
Sodium: 133 mmol/L — ABNORMAL LOW (ref 135–145)

## 2023-04-20 LAB — URINE CULTURE: Culture: >=100000 — AB

## 2023-04-20 LAB — MAGNESIUM: Magnesium: 2.2 mg/dL (ref 1.7–2.4)

## 2023-04-20 MED ORDER — CEFDINIR 300 MG PO CAPS: 300.0000 mg | ORAL_CAPSULE | Freq: Two times a day (BID) | ORAL | 0 refills | Status: AC

## 2023-04-20 NOTE — Discharge Instructions (Signed)
 IMPORTANT INFORMATION: PAY CLOSE ATTENTION   PHYSICIAN DISCHARGE INSTRUCTIONS  Follow with Primary care provider  Zachery Dauer, MD  and other consultants as instructed by your Hospitalist Physician  SEEK MEDICAL CARE OR RETURN TO EMERGENCY ROOM IF SYMPTOMS COME BACK, WORSEN OR NEW PROBLEM DEVELOPS   Please note: You were cared for by a hospitalist during your hospital stay. Every effort will be made to forward records to your primary care provider.  You can request that your primary care provider send for your hospital records if they have not received them.  Once you are discharged, your primary care physician will handle any further medical issues. Please note that NO REFILLS for any discharge medications will be authorized once you are discharged, as it is imperative that you return to your primary care physician (or establish a relationship with a primary care physician if you do not have one) for your post hospital discharge needs so that they can reassess your need for medications and monitor your lab values.  Please get a complete blood count and chemistry panel checked by your Primary MD at your next visit, and again as instructed by your Primary MD.  Get Medicines reviewed and adjusted: Please take all your medications with you for your next visit with your Primary MD  Laboratory/radiological data: Please request your Primary MD to go over all hospital tests and procedure/radiological results at the follow up, please ask your primary care provider to get all Hospital records sent to his/her office.  In some cases, they will be blood work, cultures and biopsy results pending at the time of your discharge. Please request that your primary care provider follow up on these results.  If you are diabetic, please bring your blood sugar readings with you to your follow up appointment with primary care.    Please call and make your follow up appointments as soon as possible.    Also Note  the following: If you experience worsening of your admission symptoms, develop shortness of breath, life threatening emergency, suicidal or homicidal thoughts you must seek medical attention immediately by calling 911 or calling your MD immediately  if symptoms less severe.  You must read complete instructions/literature along with all the possible adverse reactions/side effects for all the Medicines you take and that have been prescribed to you. Take any new Medicines after you have completely understood and accpet all the possible adverse reactions/side effects.   Do not drive when taking Pain medications or sleeping medications (Benzodiazepines)  Do not take more than prescribed Pain, Sleep and Anxiety Medications. It is not advisable to combine anxiety,sleep and pain medications without talking with your primary care practitioner  Special Instructions: If you have smoked or chewed Tobacco  in the last 2 yrs please stop smoking, stop any regular Alcohol  and or any Recreational drug use.  Wear Seat belts while driving.  Do not drive if taking any narcotic, mind altering or controlled substances or recreational drugs or alcohol.

## 2023-04-20 NOTE — Discharge Summary (Signed)
 Physician Discharge Summary  Richard Bush ZOX:[id number] DOB: [birth year] DOA: 04/18/2023  PCP: Zachery Dauer, MD  Admit date: 04/18/2023 Discharge date: 04/20/2023  Admitted From:  HOME  Disposition: HOME   Recommendations for Outpatient Follow-up:  Follow up with PCP in 1 weeks Follow up with nephrologist in 2 weeks  Please check renal function panel in 1 week   Discharge Condition: STABLE   CODE STATUS: FULL DIET: resume heart healthy carb    Brief Hospitalization Summary: Please see all hospital notes, images, labs for full details of the hospitalization. 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  He started having symptoms of urinary frequency and dysuria yesterday.  He also started having chills and some flank pain.  No fever reported.  Patient has been having malaise and wife brought him into the emergency department to be evaluated.  He was noted to have an abnormal urinalysis consistent with infection.  His WBC was elevated at 10.6.  Hemoglobin 10.8.  His platelet count is 111.  His potassium was 5.2 and glucose 127.  His BUN was 41 and creatinine 1.40.  He was started on IV antibiotics, IV fluids and admission requested for further management.  Hospital course by listed problem   Klebsiella Urinary Tract Infection Acute cystitis  - as evidenced by abnormal findings on urinalysis - unfortunately blood cultures not obtained prior to antibiotics given, I tried to order anyway but lab techs tried 3 times and were unable to get due to patient being a difficult  stick.  - treated with IV ceftriaxone 2 gm every 24 hours - gentle IV fluid hydration completed  - follow urine culture sent to lab  - DC home on oral cefdnir 300 mg BID x 4 more days to complete full 7 d course   History of Renal Transplant 12/2021 - pt reports he is followed at [facility]  - resumed home antirejection medication to decrease risk of acute cellular rejection  -  the risks of discontinuing kidney antirejection medication during hospitalization for a UTI include acute rejection, deterioration of graft function, and lack of clinical benefit in terms of infection outcomes. Therefore, maintaining immunosuppressive therapy, with close monitoring and potential adjustments based on the patient's condition, is generally recommended   Hyperkalemia - resolved  - this has been chronic per family - he has been prescribed daily valtessa by his nephrologist - we treated him with lokelma while inpatient and K down to 4.7 today - follow daily BMP  - held home ARB temporarily    OSA  - he uses nightly CPAP - offered nightly CPAP while in hospital    Macrocytic Anemia  - check anemia panel - follow CBC    Leukocytosis - RESOLVED  - presumably secondary to daily prednisone therapy   GERD - pantoprazole ordered for GI protection    Dehydration  - IV fluid hydration completed    CKD stage 3a  - GFR 53    Essential Hypertension  - resumed home hydralazine and metoprolol, losartan - IV labetalol and hydralazine ordered as needed - follow up with PCP and nephrologist   Discharge  Diagnoses:  Principal Problem:   UTI (urinary tract infection) Active Problems:   Hypertension   History of total hip replacement, right   Hx of total knee replacement, bilateral   History of gastroesophageal reflux (GERD)   Hyperkalemia   Hyperglycemia   Leukocytosis   Macrocytic anemia   Discharge Instructions:  Allergies as of 04/20/2023       Reactions   Kayexalate [sodium  Polystyrene Sulfonate] Other (See Comments)   Abnormal ekg   Synvisc [hylan G-f 20] Swelling   Swelling in the Knees   Altace [ramipril] Cough      Questran [cholestyramine] Other (See Comments)   Discontinued due to kidney function   Sulfa Antibiotics Rash        Medication List     TAKE these medications    acetaminophen 500 MG tablet Commonly known as: TYLENOL Take 1,000 mg by mouth 2 (two) times daily.   cefdinir 300 MG capsule Commonly known as: OMNICEF Take 1 capsule (300 mg total) by mouth 2 (two) times daily for 4 days. Start taking on: April 21, 2023   cetirizine 10 MG tablet Commonly known as: ZYRTEC Take 10 mg by mouth at bedtime.   DULoxetine 60 MG capsule Commonly known as: CYMBALTA Take 60 mg by mouth at bedtime.   fluticasone 50 MCG/ACT nasal spray Commonly known as: FLONASE Place 2 sprays into both nostrils daily as needed for allergies or rhinitis.   furosemide 40 MG tablet Commonly known as: LASIX Take 40 mg by mouth daily.   hydrALAZINE 25 MG tablet Commonly known as: APRESOLINE Take 25 mg by mouth 2 (two) times daily.   IFerex 150 150 MG capsule Generic drug: iron polysaccharides Take 150 mg by mouth daily.   loperamide 2 MG tablet Commonly known as: IMODIUM A-D Take 2 mg by mouth 4 (four) times daily as needed for diarrhea or loose stools.   losartan 50 MG tablet Commonly known as: COZAAR Take 50 mg by mouth daily.   metoprolol tartrate 25 MG tablet Commonly known as: LOPRESSOR Take 25 mg by mouth 2 (two) times daily.   mycophenolate 180 MG EC tablet Commonly known as: MYFORTIC Take 360 mg by mouth 2 (two) times daily.   omeprazole 20 MG tablet Commonly known as: PRILOSEC OTC Take 20 mg by mouth daily.   predniSONE 5 MG tablet Commonly known as: DELTASONE Take 5 mg by mouth daily.   simvastatin 20 MG tablet Commonly known as: ZOCOR Take 20 mg by mouth at bedtime.   tacrolimus 0.5 MG capsule Commonly known as:  PROGRAF Take 0.5 mg by mouth 2 (two) times daily.   tacrolimus 1 MG capsule Commonly known as: PROGRAF Take 1 mg by mouth 2 (two) times daily.   tamsulosin 0.4 MG Caps capsule Commonly known as: FLOMAX Take 0.4 mg by mouth at bedtime.   Veltassa 8.4 g packet Generic drug: patiromer Take 8.4 g by mouth daily in the afternoon.        Follow-up Information     Zachery Dauer, MD Follow up in 1 week(s).   Specialty: Family Medicine Why: Hospital Follow Up Contact information: [address] [place] [address] [phone]                Allergies  Allergen Reactions   Kayexalate [Sodium Polystyrene Sulfonate] Other (See Comments)    Abnormal ekg   Synvisc [Hylan G-F 20] Swelling    Swelling in the Knees   Altace [Ramipril] Cough  Questran [Cholestyramine] Other (See Comments)    Discontinued due to kidney function   Sulfa Antibiotics Rash   Allergies as of 04/20/2023       Reactions   Kayexalate [sodium Polystyrene Sulfonate] Other (See Comments)   Abnormal ekg   Synvisc [hylan G-f 20] Swelling   Swelling in the Knees   Altace [ramipril] Cough      Questran [cholestyramine] Other (See Comments)   Discontinued due to kidney function   Sulfa Antibiotics Rash        Medication List     TAKE these medications    acetaminophen 500 MG tablet Commonly known as: TYLENOL Take 1,000 mg by mouth 2 (two) times daily.   cefdinir 300 MG capsule Commonly known as: OMNICEF Take 1 capsule (300 mg total) by mouth 2 (two) times daily for 4 days. Start taking on: April 21, 2023   cetirizine 10 MG tablet Commonly known as: ZYRTEC Take 10 mg by mouth at bedtime.   DULoxetine 60 MG capsule Commonly known as: CYMBALTA Take 60 mg by mouth at bedtime.   fluticasone 50 MCG/ACT nasal spray Commonly known as: FLONASE Place 2 sprays into both nostrils daily as needed for allergies or rhinitis.   furosemide 40 MG tablet Commonly known as:  LASIX Take 40 mg by mouth daily.   hydrALAZINE 25 MG tablet Commonly known as: APRESOLINE Take 25 mg by mouth 2 (two) times daily.   IFerex 150 150 MG capsule Generic drug: iron polysaccharides Take 150 mg by mouth daily.   loperamide 2 MG tablet Commonly known as: IMODIUM A-D Take 2 mg by mouth 4 (four) times daily as needed for diarrhea or loose stools.   losartan 50 MG tablet Commonly known as: COZAAR Take 50 mg by mouth daily.   metoprolol tartrate 25 MG tablet Commonly known as: LOPRESSOR Take 25 mg by mouth 2 (two) times daily.   mycophenolate 180 MG EC tablet Commonly known as: MYFORTIC Take 360 mg by mouth 2 (two) times daily.   omeprazole 20 MG tablet Commonly known as: PRILOSEC OTC Take 20 mg by mouth daily.   predniSONE 5 MG tablet Commonly known as: DELTASONE Take 5 mg by mouth daily.   simvastatin 20 MG tablet Commonly known as: ZOCOR Take 20 mg by mouth at bedtime.   tacrolimus 0.5 MG capsule Commonly known as: PROGRAF Take 0.5 mg by mouth 2 (two) times daily.   tacrolimus 1 MG capsule Commonly known as: PROGRAF Take 1 mg by mouth 2 (two) times daily.   tamsulosin 0.4 MG Caps capsule Commonly known as: FLOMAX Take 0.4 mg by mouth at bedtime.   Veltassa 8.4 g packet Generic drug: patiromer Take 8.4 g by mouth daily in the afternoon.       Procedures/Studies: No results found.   Subjective: Pt says he is feeling a lot better and he does not have fever or chills.  He is eating and drinking with no difficulty   Discharge Exam: Vitals:   04/19/23 2313 04/20/23 0425  BP:  (!) 124/46  Pulse: 67 (!) 53  Resp:  20  Temp:  98.4 F (36.9 C)  SpO2: 94% 94%   Vitals:   04/19/23 0554 04/19/23 1947 04/19/23 2313 04/20/23 0425  BP: (!) 140/49 (!) 132/49  (!) 124/46  Pulse:  78 67 (!) 53  Resp: 20 20  20   Temp: 98.8 F (37.1 C) 98 F (36.7 C)  98.4 F (36.9 C)  TempSrc: Axillary Axillary  Axillary  SpO2: 98% 94% 94% 94%  Weight:       Height:       General: Pt is alert, awake, not in acute distress Cardiovascular: normal S1/S2 +, no rubs, no gallops Respiratory: CTA bilaterally, no wheezing, no rhonchi Abdominal: Soft, NT, ND, bowel sounds + Extremities: 1+ bilateral lower extremity edema, no cyanosis   The results of significant diagnostics from this hospitalization (including imaging, microbiology, ancillary and laboratory) are listed below for reference.     Microbiology: Recent Results (from the past 240 hours)  Urine Culture     Status: Abnormal   Collection Time: 04/18/23 11:03 AM   Specimen: Urine, Clean Catch  Result Value Ref Range Status   Specimen Description   Final    URINE, CLEAN CATCH Performed at [facility], [address] [place], [place] [address]    Special Requests   Final    NONE Performed at [facility], [address], [place], [place] [address]    Culture >=100,000 COLONIES/mL KLEBSIELLA PNEUMONIAE (A)  Final   Report Status 04/20/2023 FINAL  Final   Organism ID, Bacteria KLEBSIELLA PNEUMONIAE (A)  Final      Susceptibility   Klebsiella pneumoniae - MIC*    AMPICILLIN >=32 RESISTANT Resistant     CEFAZOLIN <=4 SENSITIVE Sensitive     CEFEPIME <=0.12 SENSITIVE Sensitive     CEFTRIAXONE <=0.25 SENSITIVE Sensitive     CIPROFLOXACIN <=0.25 SENSITIVE Sensitive     GENTAMICIN <=1 SENSITIVE Sensitive     IMIPENEM <=0.25 SENSITIVE Sensitive     NITROFURANTOIN 64 INTERMEDIATE Intermediate     TRIMETH/SULFA >=320 RESISTANT Resistant     AMPICILLIN/SULBACTAM 4 SENSITIVE Sensitive     PIP/TAZO <=4 SENSITIVE Sensitive ug/mL    * >=100,000 COLONIES/mL KLEBSIELLA PNEUMONIAE  Resp panel by RT-PCR (RSV, Flu A&B, Covid) Anterior Nasal Swab     Status: None   Collection Time: 04/18/23  2:46 PM   Specimen: Anterior Nasal Swab  Result Value Ref Range Status   SARS Coronavirus 2 by RT PCR NEGATIVE NEGATIVE Final    Comment: (NOTE) SARS-CoV-2 target nucleic acids are NOT DETECTED.  The  SARS-CoV-2 RNA is generally detectable in upper respiratory specimens during the acute phase of infection. The lowest concentration of SARS-CoV-2 viral copies this assay can detect is 138 copies/mL. A negative result does not preclude SARS-Cov-2 infection and should not be used as the sole basis for treatment or other patient management decisions. A negative result may occur with  improper specimen collection/handling, submission of specimen other than nasopharyngeal swab, presence of viral mutation(s) within the areas targeted by this assay, and inadequate number of viral copies(<138 copies/mL). A negative result must be combined with clinical observations, patient history, and epidemiological information. The expected result is Negative.  Fact Sheet for Patients:  BloggerCourse.com  Fact Sheet for Healthcare Providers:  SeriousBroker.it  This test is no t yet approved or cleared by the [place] FDA and  has been authorized for detection and/or diagnosis of SARS-CoV-2 by FDA under an Emergency Use Authorization (EUA). This EUA will remain  in effect (meaning this test can be used) for the duration of the COVID-19 declaration under Section 564(b)(1) of the Act, 21 U.S.C.section 360bbb-3(b)(1), unless the authorization is terminated  or revoked sooner.       Influenza A by PCR NEGATIVE NEGATIVE Final   Influenza B by PCR NEGATIVE NEGATIVE Final    Comment: (NOTE) The Xpert Xpress SARS-CoV-2/FLU/RSV plus assay is intended as an aid  in the diagnosis of influenza from Nasopharyngeal swab specimens and should not be used as a sole basis for treatment. Nasal washings and aspirates are unacceptable for Xpert Xpress SARS-CoV-2/FLU/RSV testing.  Fact Sheet for Patients: BloggerCourse.com  Fact Sheet for Healthcare Providers: SeriousBroker.it  This test is not yet approved or  cleared by the [place] FDA and has been authorized for detection and/or diagnosis of SARS-CoV-2 by FDA under an Emergency Use Authorization (EUA). This EUA will remain in effect (meaning this test can be used) for the duration of the COVID-19 declaration under Section 564(b)(1) of the Act, 21 U.S.C. section 360bbb-3(b)(1), unless the authorization is terminated or revoked.     Resp Syncytial Virus by PCR NEGATIVE NEGATIVE Final    Comment: (NOTE) Fact Sheet for Patients: BloggerCourse.com  Fact Sheet for Healthcare Providers: SeriousBroker.it  This test is not yet approved or cleared by the [place] FDA and has been authorized for detection and/or diagnosis of SARS-CoV-2 by FDA under an Emergency Use Authorization (EUA). This EUA will remain in effect (meaning this test can be used) for the duration of the COVID-19 declaration under Section 564(b)(1) of the Act, 21 U.S.C. section 360bbb-3(b)(1), unless the authorization is terminated or revoked.  Performed at [facility], [address], [place], [place] [address]      Labs: BNP (last 3 results) No results for input(s): "BNP" in the last 8760 hours. Basic Metabolic Panel: Recent Labs  Lab 04/18/23 1220 04/19/23 0457 04/20/23 0449  NA 138 137 133*  K 5.2* 4.9 4.7  CL 108 111 109  CO2 20* 19* 20*  GLUCOSE 127* 112* 103*  BUN 41* 37* 40*  CREATININE 1.40* 1.40* 1.58*  CALCIUM 9.8 9.1 8.8*  MG  --  1.7 2.2  PHOS  --  2.6  --    Liver Function Tests: Recent Labs  Lab 04/18/23 1220  AST 21  ALT 25  ALKPHOS 56  BILITOT 1.2  PROT 7.4  ALBUMIN 3.8   No results for input(s): "LIPASE", "AMYLASE" in the last 168 hours. No results for input(s): "AMMONIA" in the last 168 hours. CBC: Recent Labs  Lab 04/18/23 1220 04/19/23 0457 04/20/23 0449  WBC 10.6* 9.4 7.5  NEUTROABS 9.4* 8.4*  --   HGB 10.8* 9.6* 9.6*  HCT 36.6* 31.6* 32.2*  MCV 101.4* 100.0  100.6*  PLT 111* 100* 100*   Cardiac Enzymes: No results for input(s): "CKTOTAL", "CKMB", "CKMBINDEX", "TROPONINI" in the last 168 hours. BNP: Invalid input(s): "POCBNP" CBG: No results for input(s): "GLUCAP" in the last 168 hours. D-Dimer No results for input(s): "DDIMER" in the last 72 hours. Hgb A1c Recent Labs    04/18/23 1220  HGBA1C 6.0*   Lipid Profile No results for input(s): "CHOL", "HDL", "LDLCALC", "TRIG", "CHOLHDL", "LDLDIRECT" in the last 72 hours. Thyroid function studies No results for input(s): "TSH", "T4TOTAL", "T3FREE", "THYROIDAB" in the last 72 hours.  Invalid input(s): "FREET3" Anemia work up Recent Labs    04/19/23 0457  VITAMINB12 327  FOLATE 5.8*  FERRITIN 217  TIBC 254  IRON 12*  RETICCTPCT 1.4   Urinalysis    Component Value Date/Time   COLORURINE YELLOW 04/18/2023 1103   APPEARANCEUR HAZY (A) 04/18/2023 1103   LABSPEC 1.013 04/18/2023 1103   PHURINE 5.0 04/18/2023 1103   GLUCOSEU NEGATIVE 04/18/2023 1103   HGBUR LARGE (A) 04/18/2023 1103   BILIRUBINUR NEGATIVE 04/18/2023 1103   KETONESUR NEGATIVE 04/18/2023 1103   PROTEINUR 30 (A) 04/18/2023 1103   UROBILINOGEN 0.2 07/18/2013  1248   NITRITE NEGATIVE 04/18/2023 1103   LEUKOCYTESUR MODERATE (A) 04/18/2023 1103   Sepsis Labs Recent Labs  Lab 04/18/23 1220 04/19/23 0457 04/20/23 0449  WBC 10.6* 9.4 7.5   Microbiology Recent Results (from the past 240 hours)  Urine Culture     Status: Abnormal   Collection Time: 04/18/23 11:03 AM   Specimen: Urine, Clean Catch  Result Value Ref Range Status   Specimen Description   Final    URINE, CLEAN CATCH Performed at [facility], [address], [place], [place] [address]    Special Requests   Final    NONE Performed at [facility], [address], [place], [place] [address]    Culture >=100,000 COLONIES/mL KLEBSIELLA PNEUMONIAE (A)  Final   Report Status 04/20/2023 FINAL  Final   Organism ID, Bacteria KLEBSIELLA PNEUMONIAE (A)   Final      Susceptibility   Klebsiella pneumoniae - MIC*    AMPICILLIN >=32 RESISTANT Resistant     CEFAZOLIN <=4 SENSITIVE Sensitive     CEFEPIME <=0.12 SENSITIVE Sensitive     CEFTRIAXONE <=0.25 SENSITIVE Sensitive     CIPROFLOXACIN <=0.25 SENSITIVE Sensitive     GENTAMICIN <=1 SENSITIVE Sensitive     IMIPENEM <=0.25 SENSITIVE Sensitive     NITROFURANTOIN 64 INTERMEDIATE Intermediate     TRIMETH/SULFA >=320 RESISTANT Resistant     AMPICILLIN/SULBACTAM 4 SENSITIVE Sensitive     PIP/TAZO <=4 SENSITIVE Sensitive ug/mL    * >=100,000 COLONIES/mL KLEBSIELLA PNEUMONIAE  Resp panel by RT-PCR (RSV, Flu A&B, Covid) Anterior Nasal Swab     Status: None   Collection Time: 04/18/23  2:46 PM   Specimen: Anterior Nasal Swab  Result Value Ref Range Status   SARS Coronavirus 2 by RT PCR NEGATIVE NEGATIVE Final    Comment: (NOTE) SARS-CoV-2 target nucleic acids are NOT DETECTED.  The SARS-CoV-2 RNA is generally detectable in upper respiratory specimens during the acute phase of infection. The lowest concentration of SARS-CoV-2 viral copies this assay can detect is 138 copies/mL. A negative result does not preclude SARS-Cov-2 infection and should not be used as the sole basis for treatment or other patient management decisions. A negative result may occur with  improper specimen collection/handling, submission of specimen other than nasopharyngeal swab, presence of viral mutation(s) within the areas targeted by this assay, and inadequate number of viral copies(<138 copies/mL). A negative result must be combined with clinical observations, patient history, and epidemiological information. The expected result is Negative.  Fact Sheet for Patients:  BloggerCourse.com  Fact Sheet for Healthcare Providers:  SeriousBroker.it  This test is no t yet approved or cleared by the [place] FDA and  has been authorized for detection and/or diagnosis  of SARS-CoV-2 by FDA under an Emergency Use Authorization (EUA). This EUA will remain  in effect (meaning this test can be used) for the duration of the COVID-19 declaration under Section 564(b)(1) of the Act, 21 U.S.C.section 360bbb-3(b)(1), unless the authorization is terminated  or revoked sooner.       Influenza A by PCR NEGATIVE NEGATIVE Final   Influenza B by PCR NEGATIVE NEGATIVE Final    Comment: (NOTE) The Xpert Xpress SARS-CoV-2/FLU/RSV plus assay is intended as an aid in the diagnosis of influenza from Nasopharyngeal swab specimens and should not be used as a sole basis for treatment. Nasal washings and aspirates are unacceptable for Xpert Xpress SARS-CoV-2/FLU/RSV testing.  Fact Sheet for Patients: BloggerCourse.com  Fact Sheet for Healthcare Providers: SeriousBroker.it  This test is  not yet approved or cleared by the [place] and has been authorized for detection and/or diagnosis of SARS-CoV-2 by FDA under an Emergency Use Authorization (EUA). This EUA will remain in effect (meaning this test can be used) for the duration of the COVID-19 declaration under Section 564(b)(1) of the Act, 21 U.S.C. section 360bbb-3(b)(1), unless the authorization is terminated or revoked.     Resp Syncytial Virus by PCR NEGATIVE NEGATIVE Final    Comment: (NOTE) Fact Sheet for Patients: BloggerCourse.com  Fact Sheet for Healthcare Providers: SeriousBroker.it  This test is not yet approved or cleared by the [place] FDA and has been authorized for detection and/or diagnosis of SARS-CoV-2 by FDA under an Emergency Use Authorization (EUA). This EUA will remain in effect (meaning this test can be used) for the duration of the COVID-19 declaration under Section 564(b)(1) of the Act, 21 U.S.C. section 360bbb-3(b)(1), unless the authorization is terminated  or revoked.  Performed at [facility], [address], [place], [place] [address]    Time coordinating discharge:  35 mins   SIGNED:  Standley Dakins, MD  [facility] 04/20/2023, 10:17 AM How to contact the [facility] Attending or Consulting provider 7A - 7P or covering provider during after hours 7P -7A, for this patient?  Check the care team in [facility] and look for a) attending/consulting TR[facility] provider listed and b) the [facility] team listed Log into [email or web address] and use [place]'s universal password to access. If you do not have the password, please contact the hospital operator. Locate the [facility] provider you are looking for under [facility] and page to a number that you can be directly reached. If you still have difficulty reaching the provider, please page the [facility] (Director on Call) for the Hospitalists listed on amion for assistance.

## 2023-09-25 ENCOUNTER — Encounter (HOSPITAL_COMMUNITY): Payer: Self-pay

## 2024-01-07 ENCOUNTER — Other Ambulatory Visit: Payer: Self-pay | Admitting: Urology

## 2024-01-07 DIAGNOSIS — N281 Cyst of kidney, acquired: Secondary | ICD-10-CM

## 2024-02-12 ENCOUNTER — Ambulatory Visit
Admission: RE | Admit: 2024-02-12 | Discharge: 2024-02-12 | Disposition: A | Source: Ambulatory Visit | Attending: Urology

## 2024-02-12 DIAGNOSIS — N281 Cyst of kidney, acquired: Secondary | ICD-10-CM

## 2024-02-12 MED ORDER — GADOPICLENOL 0.5 MMOL/ML IV SOLN: 10.0000 mL | Freq: Once | INTRAVENOUS | Status: DC | PRN

## 2024-02-15 ENCOUNTER — Other Ambulatory Visit

## 2024-03-16 ENCOUNTER — Ambulatory Visit: Admission: RE | Admit: 2024-03-16 | Source: Ambulatory Visit
